# Patient Record
Sex: Female | Born: 1935 | Race: White | Hispanic: No | Smoking: Former smoker
Health system: Southern US, Community
[De-identification: ages and names within clinical notes are randomized; demographics above are authoritative.]

## PROBLEM LIST (undated history)

## (undated) DIAGNOSIS — R339 Retention of urine, unspecified: Secondary | ICD-10-CM

## (undated) DIAGNOSIS — I509 Heart failure, unspecified: Secondary | ICD-10-CM

## (undated) DIAGNOSIS — I82511 Chronic embolism and thrombosis of right femoral vein: Secondary | ICD-10-CM

## (undated) DIAGNOSIS — E785 Hyperlipidemia, unspecified: Secondary | ICD-10-CM

## (undated) DIAGNOSIS — J90 Pleural effusion, not elsewhere classified: Secondary | ICD-10-CM

## (undated) DIAGNOSIS — E46 Unspecified protein-calorie malnutrition: Secondary | ICD-10-CM

## (undated) DIAGNOSIS — J189 Pneumonia, unspecified organism: Secondary | ICD-10-CM

## (undated) DIAGNOSIS — I495 Sick sinus syndrome: Secondary | ICD-10-CM

## (undated) DIAGNOSIS — C3490 Malignant neoplasm of unspecified part of unspecified bronchus or lung: Secondary | ICD-10-CM

## (undated) DIAGNOSIS — M810 Age-related osteoporosis without current pathological fracture: Secondary | ICD-10-CM

## (undated) DIAGNOSIS — E876 Hypokalemia: Secondary | ICD-10-CM

## (undated) DIAGNOSIS — J9690 Respiratory failure, unspecified, unspecified whether with hypoxia or hypercapnia: Secondary | ICD-10-CM

## (undated) DIAGNOSIS — R1310 Dysphagia, unspecified: Secondary | ICD-10-CM

## (undated) DIAGNOSIS — J449 Chronic obstructive pulmonary disease, unspecified: Secondary | ICD-10-CM

## (undated) DIAGNOSIS — K219 Gastro-esophageal reflux disease without esophagitis: Secondary | ICD-10-CM

## (undated) DIAGNOSIS — D649 Anemia, unspecified: Secondary | ICD-10-CM

## (undated) DIAGNOSIS — I272 Pulmonary hypertension, unspecified: Secondary | ICD-10-CM

## (undated) DIAGNOSIS — I493 Ventricular premature depolarization: Secondary | ICD-10-CM

---

## 2020-09-12 ENCOUNTER — Emergency Department (HOSPITAL_COMMUNITY): Payer: Medicare Other

## 2020-09-12 ENCOUNTER — Encounter (HOSPITAL_COMMUNITY): Payer: Self-pay | Admitting: Family Medicine

## 2020-09-12 ENCOUNTER — Other Ambulatory Visit: Payer: Self-pay

## 2020-09-12 ENCOUNTER — Inpatient Hospital Stay (HOSPITAL_COMMUNITY): Payer: Medicare Other

## 2020-09-12 ENCOUNTER — Inpatient Hospital Stay (HOSPITAL_COMMUNITY)
Admission: EM | Admit: 2020-09-12 | Discharge: 2020-09-17 | DRG: 186 | Disposition: A | Discharge status: Skilled Nursing Facility | Payer: Medicare Other | Source: Skilled Nursing Facility | Attending: Internal Medicine | Admitting: Internal Medicine

## 2020-09-12 DIAGNOSIS — Z880 Allergy status to penicillin: Secondary | ICD-10-CM | POA: Diagnosis not present

## 2020-09-12 DIAGNOSIS — J90 Pleural effusion, not elsewhere classified: Principal | ICD-10-CM | POA: Diagnosis present

## 2020-09-12 DIAGNOSIS — J9621 Acute and chronic respiratory failure with hypoxia: Secondary | ICD-10-CM | POA: Diagnosis present

## 2020-09-12 DIAGNOSIS — R0602 Shortness of breath: Secondary | ICD-10-CM

## 2020-09-12 DIAGNOSIS — Z885 Allergy status to narcotic agent status: Secondary | ICD-10-CM | POA: Diagnosis not present

## 2020-09-12 DIAGNOSIS — U071 COVID-19: Secondary | ICD-10-CM | POA: Diagnosis present

## 2020-09-12 DIAGNOSIS — Z881 Allergy status to other antibiotic agents status: Secondary | ICD-10-CM

## 2020-09-12 DIAGNOSIS — I495 Sick sinus syndrome: Secondary | ICD-10-CM | POA: Diagnosis present

## 2020-09-12 DIAGNOSIS — L89321 Pressure ulcer of left buttock, stage 1: Secondary | ICD-10-CM | POA: Diagnosis present

## 2020-09-12 DIAGNOSIS — F0390 Unspecified dementia without behavioral disturbance: Secondary | ICD-10-CM | POA: Diagnosis present

## 2020-09-12 DIAGNOSIS — Z9889 Other specified postprocedural states: Secondary | ICD-10-CM

## 2020-09-12 DIAGNOSIS — R0902 Hypoxemia: Secondary | ICD-10-CM

## 2020-09-12 DIAGNOSIS — J449 Chronic obstructive pulmonary disease, unspecified: Secondary | ICD-10-CM

## 2020-09-12 DIAGNOSIS — R0600 Dyspnea, unspecified: Secondary | ICD-10-CM

## 2020-09-12 LAB — URINALYSIS, ROUTINE W REFLEX MICROSCOPIC
Bacteria, UA: NONE SEEN
Bilirubin Urine: NEGATIVE
Glucose, UA: NEGATIVE mg/dL
Ketones, ur: NEGATIVE mg/dL
Nitrite: NEGATIVE
Protein, ur: NEGATIVE mg/dL
Specific Gravity, Urine: 1.008 (ref 1.005–1.030)
pH: 7 (ref 5.0–8.0)

## 2020-09-12 LAB — CBC WITH DIFFERENTIAL/PLATELET
Abs Immature Granulocytes: 0.03 10*3/uL (ref 0.00–0.07)
Basophils Absolute: 0 10*3/uL (ref 0.0–0.1)
Basophils Relative: 1 %
Eosinophils Absolute: 0 10*3/uL (ref 0.0–0.5)
Eosinophils Relative: 0 %
HCT: 38.4 % (ref 36.0–46.0)
Hemoglobin: 12.2 g/dL (ref 12.0–15.0)
Immature Granulocytes: 1 %
Lymphocytes Relative: 24 %
Lymphs Abs: 1.5 10*3/uL (ref 0.7–4.0)
MCH: 29.9 pg (ref 26.0–34.0)
MCHC: 31.8 g/dL (ref 30.0–36.0)
MCV: 94.1 fL (ref 80.0–100.0)
Monocytes Absolute: 0.6 10*3/uL (ref 0.1–1.0)
Monocytes Relative: 9 %
Neutro Abs: 3.9 10*3/uL (ref 1.7–7.7)
Neutrophils Relative %: 65 %
Platelets: 264 10*3/uL (ref 150–400)
RBC: 4.08 MIL/uL (ref 3.87–5.11)
RDW: 13.2 % (ref 11.5–15.5)
WBC: 6 10*3/uL (ref 4.0–10.5)
nRBC: 0 % (ref 0.0–0.2)

## 2020-09-12 LAB — COMPREHENSIVE METABOLIC PANEL
ALT: 25 U/L (ref 0–44)
AST: 37 U/L (ref 15–41)
Albumin: 3.1 g/dL — ABNORMAL LOW (ref 3.5–5.0)
Alkaline Phosphatase: 145 U/L — ABNORMAL HIGH (ref 38–126)
Anion gap: 10 (ref 5–15)
BUN: 16 mg/dL (ref 8–23)
CO2: 27 mmol/L (ref 22–32)
Calcium: 8 mg/dL — ABNORMAL LOW (ref 8.9–10.3)
Chloride: 94 mmol/L — ABNORMAL LOW (ref 98–111)
Creatinine, Ser: 0.68 mg/dL (ref 0.44–1.00)
GFR, Estimated: >60 mL/min (ref >60)
Glucose, Bld: 108 mg/dL — ABNORMAL HIGH (ref 70–99)
Potassium: 4.2 mmol/L (ref 3.5–5.1)
Sodium: 131 mmol/L — ABNORMAL LOW (ref 135–145)
Total Bilirubin: 0.6 mg/dL (ref 0.3–1.2)
Total Protein: 5.9 g/dL — ABNORMAL LOW (ref 6.5–8.1)

## 2020-09-12 LAB — RESP PANEL BY RT-PCR (FLU A&B, COVID) ARPGX2
Influenza A by PCR: NEGATIVE
Influenza B by PCR: NEGATIVE
SARS Coronavirus 2 by RT PCR: POSITIVE — AB

## 2020-09-12 LAB — LIPASE, BLOOD: Lipase: 27 U/L (ref 11–51)

## 2020-09-12 LAB — BRAIN NATRIURETIC PEPTIDE: B Natriuretic Peptide: 344.1 pg/mL — ABNORMAL HIGH (ref 0.0–100.0)

## 2020-09-12 LAB — TROPONIN I (HIGH SENSITIVITY)
Troponin I (High Sensitivity): 27 ng/L — ABNORMAL HIGH (ref <18)
Troponin I (High Sensitivity): 36 ng/L — ABNORMAL HIGH (ref <18)

## 2020-09-12 MED ORDER — MOMETASONE FURO-FORMOTEROL FUM 200-5 MCG/ACT IN AERO
2.0000 | INHALATION_SPRAY | Freq: Two times a day (BID) | RESPIRATORY_TRACT | Status: DC
  Administered 2020-09-13 – 2020-09-17 (×9): 2 via RESPIRATORY_TRACT
  Filled 2020-09-12: qty 8.8

## 2020-09-12 MED ORDER — SODIUM CHLORIDE 0.9 % IV SOLN
500.0000 mg | INTRAVENOUS | Status: DC
  Administered 2020-09-13 – 2020-09-14 (×2): 500 mg via INTRAVENOUS
  Filled 2020-09-12 (×4): qty 500

## 2020-09-12 MED ORDER — ENOXAPARIN SODIUM 40 MG/0.4ML IJ SOSY
40.0000 mg | PREFILLED_SYRINGE | INTRAMUSCULAR | Status: DC
  Administered 2020-09-13 – 2020-09-14 (×2): 40 mg via SUBCUTANEOUS
  Filled 2020-09-12 (×2): qty 0.4

## 2020-09-12 MED ORDER — SENNOSIDES-DOCUSATE SODIUM 8.6-50 MG PO TABS: 1.0000 | ORAL_TABLET | Freq: Every evening | ORAL | Status: DC | PRN

## 2020-09-12 MED ORDER — ONDANSETRON HCL 4 MG/2ML IJ SOLN
4.0000 mg | Freq: Four times a day (QID) | INTRAMUSCULAR | Status: DC | PRN
  Administered 2020-09-14 (×2): 4 mg via INTRAVENOUS
  Filled 2020-09-12 (×2): qty 2

## 2020-09-12 MED ORDER — ACETAMINOPHEN 325 MG PO TABS
650.0000 mg | ORAL_TABLET | Freq: Four times a day (QID) | ORAL | Status: DC | PRN
  Administered 2020-09-13: 650 mg via ORAL
  Filled 2020-09-12: qty 2

## 2020-09-12 MED ORDER — ALBUTEROL SULFATE (2.5 MG/3ML) 0.083% IN NEBU
2.5000 mg | INHALATION_SOLUTION | RESPIRATORY_TRACT | Status: DC | PRN
  Filled 2020-09-12: qty 3

## 2020-09-12 MED ORDER — SODIUM CHLORIDE 0.9 % IV BOLUS
500.0000 mL | Freq: Once | INTRAVENOUS | Status: AC
  Administered 2020-09-12: 500 mL via INTRAVENOUS

## 2020-09-12 MED ORDER — ONDANSETRON HCL 4 MG PO TABS
4.0000 mg | ORAL_TABLET | Freq: Four times a day (QID) | ORAL | Status: DC | PRN
  Administered 2020-09-15: 4 mg via ORAL
  Filled 2020-09-12: qty 1

## 2020-09-12 MED ORDER — FUROSEMIDE 10 MG/ML IJ SOLN
40.0000 mg | Freq: Every morning | INTRAMUSCULAR | Status: AC
  Administered 2020-09-13 – 2020-09-14 (×2): 40 mg via INTRAVENOUS
  Filled 2020-09-12 (×2): qty 4

## 2020-09-12 MED ORDER — ACETAMINOPHEN 650 MG RE SUPP: 650.0000 mg | Freq: Four times a day (QID) | RECTAL | Status: DC | PRN

## 2020-09-12 MED ORDER — FUROSEMIDE 10 MG/ML IJ SOLN
40.0000 mg | Freq: Once | INTRAMUSCULAR | Status: AC
  Administered 2020-09-12: 40 mg via INTRAVENOUS
  Filled 2020-09-12: qty 4

## 2020-09-12 MED ORDER — SODIUM CHLORIDE 0.9 % IV SOLN
1.0000 g | INTRAVENOUS | Status: DC
  Administered 2020-09-13 (×2): 1 g via INTRAVENOUS
  Filled 2020-09-12 (×3): qty 10
  Filled 2020-09-12: qty 1

## 2020-09-12 NOTE — ED Provider Notes (Signed)
[facility] DEPT Provider Note   CSN: [id number] Arrival date & time: 09/12/20  1749     History Chief Complaint  Patient presents with   Shortness of Breath    Samantha Fitzgerald is a 87 y.o. female with past medical history significant for CHF, COPD, prior pleural effusion who presents for evaluation of shortness of breath.  Typically wears 2 L of oxygen at baseline.  Facility called EMS out for oxygen at 60%.  Per EMS patient was 77% on her baseline 2 L.  Increased to 5 L up to 95%.  Daughter states they went to visit her today and patient did not remember that they had visited her.  They feel like she is generally weak.  She has had unproductive cough.   Patient denies any complaints.  No headache, fever, chest pain, shortness of breath, abdominal pain, numbness.  Began to have NBNB emesis with EMS x 1.  Denies additional aggravating or alleviating factors.  History obtained from patient and past medical records.  No interpreter used.    Daughter Nehemiah Massed at [phone]   >>>Additional history from daughter in room.  States she does not feel patient is altered.  Patient has transient confusion which is at baseline.  States for the last few days patient has had a cough, SOB.  Her nursing facility was not able to get her oxygen above 60%. No recent falls. No sick contacts.  HPI     No past medical history on file.  Patient Active Problem List   Diagnosis Date Noted   Pleural effusion on left 09/12/2020     History reviewed  OB History   No obstetric history on file.     No family history on file.     Home Medications Prior to Admission medications   Not on File    Allergies    Amoxicillin, Doxycycline, Levofloxacin, and Morphine and related  Review of Systems   Review of Systems  Constitutional: Negative.   HENT: Negative.    Respiratory: Negative.    Cardiovascular: Negative.   Gastrointestinal: Negative.    Genitourinary: Negative.   Musculoskeletal: Negative.   Skin: Negative.   Neurological: Negative.   All other systems reviewed and are negative.  Physical Exam Updated Vital Signs BP (!) 114/44   Pulse (!) 53   Temp 97.6 F (36.4 C) (Oral)   Resp (!) 22   Ht '5\' 5"'  (1.651 m)   Wt 59 kg   SpO2 98%   BMI 21.63 kg/m   Physical Exam Vitals and nursing note reviewed.  Constitutional:      General: She is not in acute distress.    Appearance: She is well-developed. She is not ill-appearing, toxic-appearing or diaphoretic.  HENT:     Head: Normocephalic and atraumatic.  Eyes:     Pupils: Pupils are equal, round, and reactive to light.  Cardiovascular:     Rate and Rhythm: Normal rate.     Pulses: Normal pulses.          Dorsalis pedis pulses are 2+ on the right side and 2+ on the left side.     Heart sounds: Normal heart sounds.  Pulmonary:     Effort: Pulmonary effort is normal. No respiratory distress.     Breath sounds: Normal breath sounds.     Comments: 3.5 L [place]. Speaks in full sentences without difficulty. Decreased lung sounds on bilateral lower lungs Chest:     Comments: Equal rise  and fall to chest wall Abdominal:     General: Bowel sounds are normal. There is no distension.     Palpations: Abdomen is soft.     Comments: Soft, nontender  Musculoskeletal:        General: Normal range of motion.     Cervical back: Normal range of motion.     Right lower leg: No tenderness. No edema.     Left lower leg: No tenderness. No edema.     Comments: No bony tenderness.  Moves all 4 extremities without difficulty  Feet:     Right foot:     Toenail Condition: Right toenails are long.     Left foot:     Toenail Condition: Left toenails are long.  Skin:    General: Skin is warm and dry.  Neurological:     General: No focal deficit present.     Mental Status: She is alert.     Cranial Nerves: Cranial nerves are intact.     Motor: No tremor or pronator drift.      Comments: Cranial nerves 2-12  grossly intact Moves all 4 extremities Alert to person, place, time Equal hand grip bilaterally  Psychiatric:        Mood and Affect: Mood normal.    ED Results / Procedures / Treatments   Labs (all labs ordered are listed, but only abnormal results are displayed) Labs Reviewed  COMPREHENSIVE METABOLIC PANEL - Abnormal; Notable for the following components:      Result Value   Sodium 131 (*)    Chloride 94 (*)    Glucose, Bld 108 (*)    Calcium 8.0 (*)    Total Protein 5.9 (*)    Albumin 3.1 (*)    Alkaline Phosphatase 145 (*)    All other components within normal limits  BRAIN NATRIURETIC PEPTIDE - Abnormal; Notable for the following components:   B Natriuretic Peptide 344.1 (*)    All other components within normal limits  TROPONIN I (HIGH SENSITIVITY) - Abnormal; Notable for the following components:   Troponin I (High Sensitivity) 27 (*)    All other components within normal limits  TROPONIN I (HIGH SENSITIVITY) - Abnormal; Notable for the following components:   Troponin I (High Sensitivity) 36 (*)    All other components within normal limits  RESP PANEL BY RT-PCR (FLU A&B, COVID) [id number]  URINE CULTURE  CULTURE, BLOOD (ROUTINE X 2)  CULTURE, BLOOD (ROUTINE X 2)  CBC WITH DIFFERENTIAL/PLATELET  LIPASE, BLOOD  URINALYSIS, ROUTINE W REFLEX MICROSCOPIC    EKG None  Radiology CT Head Wo Contrast  Result Date: 09/12/2020 CLINICAL DATA:  87 year old female with delirium. EXAM: CT HEAD WITHOUT CONTRAST TECHNIQUE: Contiguous axial images were obtained from the base of the skull through the vertex without intravenous contrast. COMPARISON:  None. FINDINGS: Brain: There is mild age-related atrophy and advanced chronic microvascular ischemic changes. There is no acute intracranial hemorrhage. No mass effect or midline shift. No extra-axial fluid collection. Vascular: No hyperdense vessel or unexpected calcification. Skull: Normal. Negative for  fracture or focal lesion. Sinuses/Orbits: No acute finding. Other: None IMPRESSION: 1. No acute intracranial pathology. 2. Age-related atrophy and advanced chronic microvascular ischemic changes. Electronically Signed   By: Anner Crete M.D.   On: 09/12/2020 20:30   DG Chest Portable 1 View  Result Date: 09/12/2020 CLINICAL DATA:  Hypoxia.  Cough and weakness. EXAM: PORTABLE CHEST 1 VIEW COMPARISON:  None. FINDINGS: The patient is rotated to the right  on today's radiograph, reducing diagnostic sensitivity and specificity. Large left pleural effusion with a easy opacity in the left hemithorax partially attributable to the pleural effusion and likely partially attributable to the rightward rotation. Postoperative findings along the right hilum. Hazy density in the right apex potentially reflecting atelectasis or pneumonia although there is substantial superimposition of osseous shadows in this region which may be contributory. Descending thoracic aortic atherosclerotic calcification. Bony demineralization. IMPRESSION: 1. The patient is rotated to the right on today's radiograph, reducing diagnostic sensitivity and specificity. 2. Large left pleural effusion with passive atelectasis. Difficult to exclude superimposed left pneumonia. 3. Indistinct density at the right lung apex, airspace opacity versus superimposition of osseous shadows. 4.  Aortic Atherosclerosis (ICD10-I70.0). 5. Consider chest CT for further characterization if symptoms warrant. Electronically Signed   By: Van Clines M.D.   On: 09/12/2020 20:25    Procedures .Critical Care  Date/Time: 09/12/2020 10:09 PM Performed by: Nettie Elm, PA-C Authorized by: Nettie Elm, PA-C   Critical care provider statement:    Critical care time (minutes):  45   Critical care was necessary to treat or prevent imminent or life-threatening deterioration of the following conditions:  Respiratory failure   Critical care was time spent  personally by me on the following activities:  Discussions with consultants, evaluation of patient's response to treatment, examination of patient, ordering and performing treatments and interventions, ordering and review of laboratory studies, ordering and review of radiographic studies, pulse oximetry, re-evaluation of patient's condition, obtaining history from patient or surrogate and review of old charts   Medications Ordered in ED Medications  sodium chloride 0.9 % bolus 500 mL (0 mLs Intravenous Stopped 09/12/20 1930)  furosemide (LASIX) injection 40 mg (40 mg Intravenous Given 09/12/20 2118)   ED Course  I have reviewed the triage vital signs and the nursing notes.  Pertinent labs & imaging results that were available during my care of the patient were reviewed by me and considered in my medical decision making (see chart for details).  Here for evaluation of shortness of breath and upper respiratory complaints over the last few days.  Hypoxic on home oxygen.  Patient is afebrile, nonseptic, not ill-appearing.  Decreased breath sounds on left.  No wheezing.  She is on 5 L to maintain normal oxygen saturations.  Abdomen soft, nontender.  She does not appear clinically fluid overloaded.  EMS did mention possible confusion however daughter in room states patient is at normal mentation for her.  Has been compliant with her home meds per her daughter and MAR from facility.  Plan on labs, imaging and reassess  Labs and imaging personally reviewed and interpreted:  CBC without leukocytosis Metabolic panel sodium 629, glucose 108, alk phos 145 BNP 344 Lipase 27 COVID pending Trop 27>>36. No prior to compare in our records.  Patient denies any chest pain, low suspicion for ACS, PE or dissection. Ct head wo acute findings Chest x-ray with large left pleural effusion, cannot rule out underlying infectious process   Patient able to be titrated to 3.5 L [place] which is up from her baseline. Decreased  lung sound on left, consistent with pleural effusion.  Lasix at home per [facility] 20 mg daily.  Chest xray with large left pleural effusion. BNP elevated no prior to compare.  Discussed possible need for thoracentesis. Patient stated that she did not want this performed. Will add some lasix.  Patient will be admitted for acute on chronic hypoxic respiratory failure likely due  to large pleural effusion.  CONSULT with Dr. Tonie Griffith with [facility] who agrees to evaluate patient for admission.  Updated patient's daughter with plan for admission  The patient appears reasonably stabilized for admission considering the current resources, flow, and capabilities available in the ED at this time, and I doubt any other [facility] requiring further screening and/or treatment in the ED prior to admission.   Discussed with attending Dr. Alvino Chapel who is agreeable to treatment, plan and disposition.    MDM Rules/Calculators/A&P                           Final Clinical Impression(s) / ED Diagnoses Final diagnoses:  SOB (shortness of breath)  Pleural effusion    Rx / DC Orders ED Discharge Orders     None        Adeeb Konecny A, PA-C 09/12/20 2210    Davonna Belling, MD 09/12/20 [phone]

## 2020-09-12 NOTE — ED Triage Notes (Signed)
Per ems: pt coming from [facility] c/o onset of sudden shob. Baseline 2L @ 94%. SpO2 was 60% at facility. Per daughter, pt is having increased weakness, confusion and unproductive cough.   77% 2L for ems 95% on 5L

## 2020-09-12 NOTE — H&P (Signed)
History and Physical    Stevee Valenta GYK:[id number] DOB: [birth year] DOA: 09/12/2020  PCP: System, Provider Not In   Patient coming from: Home  Chief Complaint: SOB, cough  HPI: Samantha Fitzgerald is a 87 y.o. female with medical history significant for COPD, SSS, CHF who presents with shortness of breath and dry cough.  She typically wears oxygen at 2 L nasal cannula at home.  She was brought in from a facility by EMS after she was found in oxygen saturation of 60%.  When EMS arrived her ox saturation was 70% on her baseline 2 L of oxygen and she was increased to 5 L which increased her O2 saturation 95%.  Her daughter went to visit her today and found that she was weaker and did not look right and had a nonproductive cough so oxygen saturation was checked.  When it was so low EMS was contacted.  She reports no fever but has had some subjective chills.  She was given breathing treatments and had 1 episode of emesis with EMS in route to the hospital.  Denies any falls or injuries.  Daughter reportedly told the ER provider that patient does have some mild dementia.  Daughter is not present in the emergency room when she is seen.  ED Course: Ms. Blair Promise was found to have a large left-sided pleural effusion.  She has a mild elevated BNP level and mildly elevated troponin levels.  Sodium is 131, potassium 4.2, chloride 94, bicarb 27, BUN 16, creatinine 0.68, lipase 27, glucose 108.  CBC is within normal limits.  It has been ordered emergency room and is still pending.  Hospitalist service been asked to admit patient for further management  Review of Systems:  General: Reports weakness,  chills, weight loss, night sweats.  Denies dizziness.  HENT: Denies head trauma, headache, denies change in hearing, tinnitus.  Denies nasal bleeding.  Denies sore throat, sores in mouth.  Denies difficulty swallowing Eyes: Denies blurry vision, pain in eye, drainage.  Denies discoloration of eyes. Neck:  Denies pain.  Denies swelling.  Denies pain with movement. Cardiovascular: Denies chest pain, palpitations.  Denies edema.  Denies orthopnea Respiratory: Reports shortness of breath, dry cough.  Denies wheezing.  Denies sputum production Gastrointestinal: Denies abdominal pain, swelling.  Denies nausea, vomiting, diarrhea.  Denies melena.  Denies hematemesis. Musculoskeletal: Denies limitation of movement. Denies deformity or swelling. Denies arthralgias or myalgias. Genitourinary: Denies pelvic pain.  Denies urinary frequency or hesitancy.  Denies dysuria.  Skin: Denies rash.  Denies petechiae, purpura, ecchymosis. Neurological: Denies headache.  Denies syncope.  Denies seizure activity. Denies paresthesia.  Denies slurred speech, drooping face.  Denies visual change. Psychiatric: Denies depression, anxiety. Denies hallucinations.  History reviewed. No pertinent past medical history.  History reviewed. No pertinent surgical history.  Social History  has no history on file for tobacco use, alcohol use, and drug use.  Allergies  Allergen Reactions   Amoxicillin    Doxycycline    Levofloxacin    Morphine And Related     History reviewed. No pertinent family history.   Prior to Admission medications   Not on File    Physical Exam: Vitals:   09/12/20 1930 09/12/20 1940 09/12/20 2000 09/12/20 2100  BP:  (!) 106/38 (!) 116/39 (!) 114/44  Pulse: (!) 57 (!) 58 (!) 58 (!) 53  Resp: 19 20 (!) 21 (!) 22  Temp:      TempSrc:      SpO2: 100% 99% 100%  98%  Weight:      Height:        Constitutional: NAD, calm, comfortable Vitals:   09/12/20 1930 09/12/20 1940 09/12/20 2000 09/12/20 2100  BP:  (!) 106/38 (!) 116/39 (!) 114/44  Pulse: (!) 57 (!) 58 (!) 58 (!) 53  Resp: 19 20 (!) 21 (!) 22  Temp:      TempSrc:      SpO2: 100% 99% 100% 98%  Weight:      Height:       General: WDWN, Alert and oriented to person and place.  Eyes: EOMI, PERRL, conjunctivae normal. Sclera  nonicteric HENT:  [place]/AT, external ears normal. Nares patent without epistasis. Mucous membranes are moist. Posterior pharynx clear of any exudate or lesions. Neck: Soft, normal range of motion, supple, no masses, Trachea midline Respiratory: Decreased breath sounds on the left side.  No wheezing, no crackles. Normal respiratory effort. No accessory muscle use.  Cardiovascular: Regular rate and rhythm, no murmurs / rubs / gallops. No extremity edema.  Abdomen: Soft, no tenderness, nondistended, no rebound or guarding.  No masses palpated. Bowel sounds normoactive Musculoskeletal: FROM. No cyanosis. No joint deformity upper and lower extremities. Normal muscle tone.  Skin: Warm, dry, intact no rashes, lesions, ulcers. No induration Neurologic: CN 2-12 grossly intact.  Normal speech.  Sensation intact to touch, Strength 5/5 in all extremities.   Psychiatric:  Normal mood.    Labs on Admission: I have personally reviewed following labs and imaging studies  CBC: Recent Labs  Lab 09/12/20 1804  WBC 6.0  NEUTROABS 3.9  HGB 12.2  HCT 38.4  MCV 94.1  PLT 132    Basic Metabolic Panel: Recent Labs  Lab 09/12/20 1804  NA 131*  K 4.2  CL 94*  CO2 27  GLUCOSE 108*  BUN 16  CREATININE 0.68  CALCIUM 8.0*    GFR: Estimated Creatinine Clearance: 38.7 mL/min (by C-G formula based on SCr of 0.68 mg/dL).  Liver Function Tests: Recent Labs  Lab 09/12/20 1804  AST 37  ALT 25  ALKPHOS 145*  BILITOT 0.6  PROT 5.9*  ALBUMIN 3.1*    Urine analysis: No results found for: COLORURINE, APPEARANCEUR, LABSPEC, PHURINE, GLUCOSEU, HGBUR, BILIRUBINUR, KETONESUR, PROTEINUR, UROBILINOGEN, NITRITE, LEUKOCYTESUR  Radiological Exams on Admission: CT Head Wo Contrast  Result Date: 09/12/2020 CLINICAL DATA:  87 year old female with delirium. EXAM: CT HEAD WITHOUT CONTRAST TECHNIQUE: Contiguous axial images were obtained from the base of the skull through the vertex without intravenous contrast.  COMPARISON:  None. FINDINGS: Brain: There is mild age-related atrophy and advanced chronic microvascular ischemic changes. There is no acute intracranial hemorrhage. No mass effect or midline shift. No extra-axial fluid collection. Vascular: No hyperdense vessel or unexpected calcification. Skull: Normal. Negative for fracture or focal lesion. Sinuses/Orbits: No acute finding. Other: None IMPRESSION: 1. No acute intracranial pathology. 2. Age-related atrophy and advanced chronic microvascular ischemic changes. Electronically Signed   By: Anner Crete M.D.   On: 09/12/2020 20:30   CT CHEST WO CONTRAST  Result Date: 09/12/2020 CLINICAL DATA:  Abnormal x-ray EXAM: CT CHEST WITHOUT CONTRAST TECHNIQUE: Multidetector CT imaging of the chest was performed following the standard protocol without IV contrast. COMPARISON:  Chest x-ray earlier today FINDINGS: Cardiovascular: Heart is normal size. Aorta is normal caliber. Coronary artery and aortic calcifications. No aneurysm. Mediastinum/Nodes: No mediastinal, hilar, or axillary adenopathy. Trachea and esophagus are unremarkable. Thyroid unremarkable. Lungs/Pleura: Large left pleural effusion and small right pleural effusion. Compressive atelectasis in the left  lower lobe. Atelectasis or infiltrates in the right lower lobe. Upper Abdomen: Imaging into the upper abdomen demonstrates no acute findings. Musculoskeletal: Chest wall soft tissues are unremarkable. No acute bony abnormality. IMPRESSION: Large left pleural effusion and small right pleural effusion. Compressive atelectasis in the left lower lobe. Right basilar atelectasis or developing infiltrate. Coronary artery disease. Aortic Atherosclerosis (ICD10-I70.0). Electronically Signed   By: Rolm Baptise M.D.   On: 09/12/2020 22:11   DG Chest Portable 1 View  Result Date: 09/12/2020 CLINICAL DATA:  Hypoxia.  Cough and weakness. EXAM: PORTABLE CHEST 1 VIEW COMPARISON:  None. FINDINGS: The patient is rotated to  the right on today's radiograph, reducing diagnostic sensitivity and specificity. Large left pleural effusion with a easy opacity in the left hemithorax partially attributable to the pleural effusion and likely partially attributable to the rightward rotation. Postoperative findings along the right hilum. Hazy density in the right apex potentially reflecting atelectasis or pneumonia although there is substantial superimposition of osseous shadows in this region which may be contributory. Descending thoracic aortic atherosclerotic calcification. Bony demineralization. IMPRESSION: 1. The patient is rotated to the right on today's radiograph, reducing diagnostic sensitivity and specificity. 2. Large left pleural effusion with passive atelectasis. Difficult to exclude superimposed left pneumonia. 3. Indistinct density at the right lung apex, airspace opacity versus superimposition of osseous shadows. 4.  Aortic Atherosclerosis (ICD10-I70.0). 5. Consider chest CT for further characterization if symptoms warrant. Electronically Signed   By: Van Clines M.D.   On: 09/12/2020 20:25    EKG: Independently reviewed.  EKG shows normal sinus rhythm with a heart rate of 65.  No acute ST elevation or depression.  QTc 4 2  Assessment/Plan Principal Problem:   Pleural effusion on left Ms. Blair Promise will be admitted to telemetry floor.  Will be started on empiric antibiotics as pneumonia cannot be ruled out as it could be obscured by the pleural effusion.  Obtain CT of the chest to better characterize pleural effusion.  Patient may need IR consultation in the morning for thoracentesis  Active Problems:   Hypoxia Secondary to pleural effusion.  Pulmonal oxygen as needed    COPD (chronic obstructive pulmonary disease)  Patient is on trilogy at home.  None of her home medications are listed in the chart.  Pharmacy will verify and reconcile home medications.  In the meantime patient be given Dulera twice a day and  albuterol as needed.  Supplement oxygen be provided as needed to keep O2 sat between 92-96%    Sick sinus syndrome  Home medications the verified and reconciled by pharmacy and then will be resumed     DVT prophylaxis: Lovenox for DVT prophylaxis Code Status:   Full code Family Communication:  No family at bedside. Disposition Plan:   Patient is from:  Home  Anticipated DC to:  Home versus SNF, to be determined  Anticipated DC date:  Anticipate 2 midnight or more stay    Admission status:  Inpatient    Yevonne Aline Falyn Rubel MD [facility]  How to contact the [facility] Attending or Consulting provider [place] or covering provider during after hours [place], for this patient?   Check the care team in [facility] and look for a) attending/consulting [facility] provider listed and b) the [facility] team listed Log into [email or web address] and use [place] universal password to access. If you do not have the password, please contact the hospital operator. Locate the [facility] provider you are looking for under [facility] and page  to a number that you can be directly reached. If you still have difficulty reaching the provider, please page the [facility] (Director on Call) for the Hospitalists listed on amion for assistance.  09/12/2020, 10:21 PM

## 2020-09-12 NOTE — ED Notes (Signed)
Pt placed on purewick due to incontinence and need for urine specimen.

## 2020-09-13 ENCOUNTER — Inpatient Hospital Stay (HOSPITAL_COMMUNITY): Payer: Medicare Other

## 2020-09-13 ENCOUNTER — Encounter (HOSPITAL_COMMUNITY): Payer: Self-pay | Admitting: Family Medicine

## 2020-09-13 LAB — BASIC METABOLIC PANEL
Anion gap: 11 (ref 5–15)
BUN: 17 mg/dL (ref 8–23)
CO2: 30 mmol/L (ref 22–32)
Calcium: 7.8 mg/dL — ABNORMAL LOW (ref 8.9–10.3)
Chloride: 93 mmol/L — ABNORMAL LOW (ref 98–111)
Creatinine, Ser: 0.66 mg/dL (ref 0.44–1.00)
GFR, Estimated: >60 mL/min (ref >60)
Glucose, Bld: 81 mg/dL (ref 70–99)
Potassium: 4 mmol/L (ref 3.5–5.1)
Sodium: 134 mmol/L — ABNORMAL LOW (ref 135–145)

## 2020-09-13 LAB — C-REACTIVE PROTEIN: CRP: 1 mg/dL — ABNORMAL HIGH (ref <1.0)

## 2020-09-13 LAB — LACTATE DEHYDROGENASE, PLEURAL OR PERITONEAL FLUID: LD, Fluid: 77 U/L — ABNORMAL HIGH (ref 3–23)

## 2020-09-13 LAB — CREATININE, SERUM
Creatinine, Ser: 0.6 mg/dL (ref 0.44–1.00)
GFR, Estimated: >60 mL/min (ref >60)

## 2020-09-13 LAB — CBC
HCT: 36.8 % (ref 36.0–46.0)
HCT: 37.4 % (ref 36.0–46.0)
Hemoglobin: 11.6 g/dL — ABNORMAL LOW (ref 12.0–15.0)
Hemoglobin: 11.6 g/dL — ABNORMAL LOW (ref 12.0–15.0)
MCH: 29.6 pg (ref 26.0–34.0)
MCH: 29.8 pg (ref 26.0–34.0)
MCHC: 31 g/dL (ref 30.0–36.0)
MCHC: 31.5 g/dL (ref 30.0–36.0)
MCV: 94.6 fL (ref 80.0–100.0)
MCV: 95.4 fL (ref 80.0–100.0)
Platelets: 233 10*3/uL (ref 150–400)
Platelets: 247 10*3/uL (ref 150–400)
RBC: 3.89 MIL/uL (ref 3.87–5.11)
RBC: 3.92 MIL/uL (ref 3.87–5.11)
RDW: 13.3 % (ref 11.5–15.5)
RDW: 13.3 % (ref 11.5–15.5)
WBC: 4.7 10*3/uL (ref 4.0–10.5)
WBC: 4.9 10*3/uL (ref 4.0–10.5)
nRBC: 0 % (ref 0.0–0.2)
nRBC: 0 % (ref 0.0–0.2)

## 2020-09-13 LAB — LACTATE DEHYDROGENASE: LDH: 161 U/L (ref 98–192)

## 2020-09-13 LAB — BODY FLUID CELL COUNT WITH DIFFERENTIAL
Lymphs, Fluid: 91 %
Monocyte-Macrophage-Serous Fluid: 7 % — ABNORMAL LOW (ref 50–90)
Neutrophil Count, Fluid: 2 % (ref 0–25)
Total Nucleated Cell Count, Fluid: 719 cu mm (ref 0–1000)

## 2020-09-13 LAB — D-DIMER, QUANTITATIVE: D-Dimer, Quant: 3.78 ug/mL-FEU — ABNORMAL HIGH (ref 0.00–0.50)

## 2020-09-13 LAB — GLUCOSE, PLEURAL OR PERITONEAL FLUID: Glucose, Fluid: 86 mg/dL

## 2020-09-13 LAB — PROTEIN, PLEURAL OR PERITONEAL FLUID: Total protein, fluid: 3.3 g/dL

## 2020-09-13 LAB — PROCALCITONIN: Procalcitonin: <0.1 ng/mL

## 2020-09-13 MED ORDER — ZINC SULFATE 220 (50 ZN) MG PO CAPS
220.0000 mg | ORAL_CAPSULE | Freq: Every day | ORAL | Status: DC
  Administered 2020-09-13 – 2020-09-17 (×5): 220 mg via ORAL
  Filled 2020-09-13 (×6): qty 1

## 2020-09-13 MED ORDER — SODIUM CHLORIDE 0.9 % IV SOLN
100.0000 mg | Freq: Every day | INTRAVENOUS | Status: AC
  Administered 2020-09-14 – 2020-09-17 (×4): 100 mg via INTRAVENOUS
  Filled 2020-09-13 (×5): qty 20

## 2020-09-13 MED ORDER — GUAIFENESIN-DM 100-10 MG/5ML PO SYRP: 10.0000 mL | ORAL_SOLUTION | ORAL | Status: DC | PRN

## 2020-09-13 MED ORDER — LIDOCAINE HCL 1 % IJ SOLN
INTRAMUSCULAR | Status: AC
  Filled 2020-09-13: qty 20

## 2020-09-13 MED ORDER — SODIUM CHLORIDE 0.9 % IV SOLN
200.0000 mg | Freq: Once | INTRAVENOUS | Status: AC
  Administered 2020-09-13: 200 mg via INTRAVENOUS
  Filled 2020-09-13: qty 40

## 2020-09-13 MED ORDER — ASCORBIC ACID 500 MG PO TABS
500.0000 mg | ORAL_TABLET | Freq: Every day | ORAL | Status: DC
  Administered 2020-09-13 – 2020-09-17 (×5): 500 mg via ORAL
  Filled 2020-09-13 (×5): qty 1

## 2020-09-13 MED ORDER — IPRATROPIUM-ALBUTEROL 20-100 MCG/ACT IN AERS
1.0000 | INHALATION_SPRAY | Freq: Four times a day (QID) | RESPIRATORY_TRACT | Status: DC
  Administered 2020-09-13 – 2020-09-17 (×12): 1 via RESPIRATORY_TRACT
  Filled 2020-09-13 (×2): qty 4

## 2020-09-13 MED ORDER — METHYLPREDNISOLONE SODIUM SUCC 40 MG IJ SOLR
40.0000 mg | Freq: Every day | INTRAMUSCULAR | Status: DC
  Administered 2020-09-13 – 2020-09-17 (×5): 40 mg via INTRAVENOUS
  Filled 2020-09-13 (×5): qty 1

## 2020-09-13 NOTE — Procedures (Signed)
PROCEDURE SUMMARY:  Successful image-guided left thoracentesis. Yielded 850 milliliters of hazy gold fluid. Patient tolerated procedure well. No immediate complications. EBL < 1 mL.  Specimen was sent for labs. CXR ordered.  Please see imaging section of Epic for full dictation.   Claris Pong Quantrell Splitt PA-C 09/13/2020 11:27 AM

## 2020-09-13 NOTE — Progress Notes (Signed)
0053 Pt transferred from room 1406 to 1422 d/t COVID test coming back positive.  RN agrees with admitting RN's [facility]) assessment, performed skin assessment with Hillary prior to patient transport. Pt on 3L Nasal Cannula, 100%, VSS

## 2020-09-13 NOTE — Progress Notes (Signed)
PROGRESS NOTE    Samantha Fitzgerald  UKG:[id number] DOB: 09-May-1924 DOA: 09/12/2020 PCP: System, Provider Not In   Brief Narrative: Taken from H&P. Samantha Fitzgerald is a 87 y.o. female with medical history significant for COPD, SSS, CHF who presents with shortness of breath and dry cough.  She typically wears oxygen at 2 L nasal cannula at home.  She was brought in from a facility by EMS after she was found in oxygen saturation of 60%.  When EMS arrived her ox saturation was 70% on her baseline 2 L of oxygen and she was increased to 5 L which increased her O2 saturation 95%. Patient was found to have a large left-sided pleural effusion, mildly elevated BNP and troponin.  CBC within normal limit.  COVID-19 PCR came back positive. Echocardiogram ordered-pending Left-sided thoracentesis ordered-pending labs.  Subjective: Patient was seen and examined today.  Stating that shortness of breath is better while she is in the bed, becoming more short of breath with minor exertion.  Saturating in high 90s on 3 L of oxygen, baseline use of 2 L.  Discussed about thoracentesis and she agrees to proceed with.  Assessment & Plan:   Principal Problem:   Pleural effusion on left Active Problems:   COPD (chronic obstructive pulmonary disease) (HCC)   Sick sinus syndrome (HCC)   Hypoxia  Acute on chronic hypoxic respiratory failure.  Some increasing oxygen requirement as compared to her baseline of 2 L.  Left-sided pleural effusion.  CT chest with large left-sided pleural effusion and a small right-sided effusion.  Some underlying atelectasis with some concern of developing pneumonia. Ultrasound-guided thoracentesis was done with removal of 850 mL of fluid-pending labs. Echocardiogram ordered to evaluate heart failure-still pending. -Continue with IV Lasix. -Follow-up pleural fluid labs and echocardiogram. -Daily weight and BMP -Strict intake and output  COVID-19 infection.  CT chest with  concern of bilateral atelectasis and right lower lobe developing infiltrate.  COVID-19 PCR came back positive.  She was started on ceftriaxone and Zithromax by the admitting provider. -Start her on remdesivir. -Start her on steroid as there is some hypoxia as compared to baseline. -Check procalcitonin-if negative can discontinue antibiotics. -Check inflammatory markers and monitor. -Continue with supplements and supportive care. -Continue with supplemental oxygen.  History of COPD.  No wheezing today.  Patient was on Trelegy at home. -Continue home meds once med rec is completed. -Continue with supplemental oxygen to keep the saturation above 90%.  History of sick sinus syndrome.  No acute concern and rate is well controlled at this time. -Continue home medications once med rec is done-sent message to the pharmacy.  Objective: Vitals:   09/13/20 0838 09/13/20 1101 09/13/20 1114 09/13/20 1233  BP: 139/61 (!) 154/57 (!) 145/52 (!) 123/55  Pulse: (!) 55 72 68 65  Resp: 20   20  Temp: 97.7 F (36.5 C)   98.6 F (37 C)  TempSrc: Axillary   Axillary  SpO2: 98%  99% 100%  Weight:      Height:        Intake/Output Summary (Last 24 hours) at 09/13/2020 1252 Last data filed at 09/13/2020 0500 Gross per 24 hour  Intake 350 ml  Output 800 ml  Net -450 ml   Filed Weights   09/12/20 1802  Weight: 59 kg    Examination:  General exam: Frail elderly lady, appears calm and comfortable  Respiratory system: Decreased breath sound at left base. Respiratory effort normal. Cardiovascular system: S1 & S2 heard,  RRR.  Gastrointestinal system: Soft, nontender, nondistended, bowel sounds positive. Central nervous system: Alert and oriented. No focal neurological deficits. Extremities: No edema, no cyanosis, pulses intact and symmetrical. Psychiatry: Judgement and insight appear normal.   DVT prophylaxis: Lovenox Code Status: Full Family Communication: Daughter was updated at  bedside. Disposition Plan:  Status is: Inpatient  Remains inpatient appropriate because:Inpatient level of care appropriate due to severity of illness  Dispo: The patient is from: Home              Anticipated d/c is to: Home              Patient currently is not medically stable to d/c.   Difficult to place patient No              Level of care: Telemetry  All the records are reviewed and case discussed with Care Management/Social Worker. Management plans discussed with the patient, nursing and they are in agreement.  Consultants:  None  Procedures:  Antimicrobials:  Zithromax Ceftriaxone Remdesivir  Data Reviewed: I have personally reviewed following labs and imaging studies  CBC: Recent Labs  Lab 09/12/20 1804 09/13/20 0351  WBC 6.0 4.9  4.7  NEUTROABS 3.9  --   HGB 12.2 11.6*  11.6*  HCT 38.4 37.4  36.8  MCV 94.1 95.4  94.6  PLT 264 233  539   Basic Metabolic Panel: Recent Labs  Lab 09/12/20 1804 09/13/20 0351  NA 131* 134*  K 4.2 4.0  CL 94* 93*  CO2 27 30  GLUCOSE 108* 81  BUN 16 17  CREATININE 0.68 0.66  0.60  CALCIUM 8.0* 7.8*   GFR: Estimated Creatinine Clearance: 38.7 mL/min (by C-G formula based on SCr of 0.6 mg/dL). Liver Function Tests: Recent Labs  Lab 09/12/20 1804  AST 37  ALT 25  ALKPHOS 145*  BILITOT 0.6  PROT 5.9*  ALBUMIN 3.1*   Recent Labs  Lab 09/12/20 1804  LIPASE 27   No results for input(s): AMMONIA in the last 168 hours. Coagulation Profile: No results for input(s): INR, PROTIME in the last 168 hours. Cardiac Enzymes: No results for input(s): CKTOTAL, CKMB, CKMBINDEX, TROPONINI in the last 168 hours. BNP (last 3 results) No results for input(s): PROBNP in the last 8760 hours. HbA1C: No results for input(s): HGBA1C in the last 72 hours. CBG: No results for input(s): GLUCAP in the last 168 hours. Lipid Profile: No results for input(s): CHOL, HDL, LDLCALC, TRIG, CHOLHDL, LDLDIRECT in the last 72  hours. Thyroid Function Tests: No results for input(s): TSH, T4TOTAL, FREET4, T3FREE, THYROIDAB in the last 72 hours. Anemia Panel: No results for input(s): VITAMINB12, FOLATE, FERRITIN, TIBC, IRON, RETICCTPCT in the last 72 hours. Sepsis Labs: No results for input(s): PROCALCITON, LATICACIDVEN in the last 168 hours.  Recent Results (from the past 240 hour(s))  Resp Panel by RT-PCR (Flu A&B, Covid) Nasopharyngeal Swab     Status: Abnormal   Collection Time: 09/12/20  6:04 PM   Specimen: Nasopharyngeal Swab; Nasopharyngeal(NP) swabs in vial transport medium  Result Value Ref Range Status   SARS Coronavirus 2 by RT PCR POSITIVE (A) NEGATIVE Final    Comment: RESULT CALLED TO, READ BACK BY AND VERIFIED WITH: HILARY, RN @ [phone] ON 09/12/20 C VARNER (NOTE) SARS-CoV-2 target nucleic acids are DETECTED.  The SARS-CoV-2 RNA is generally detectable in upper respiratory specimens during the acute phase of infection. Positive results are indicative of the presence of the identified virus, but do not rule  out bacterial infection or co-infection with other pathogens not detected by the test. Clinical correlation with patient history and other diagnostic information is necessary to determine patient infection status. The expected result is Negative.  Fact Sheet for Patients: EntrepreneurPulse.com.au  Fact Sheet for Healthcare Providers: IncredibleEmployment.be  This test is not yet approved or cleared by the [place] FDA and  has been authorized for detection and/or diagnosis of SARS-CoV-2 by FDA under an Emergency Use Authorization (EUA).  This EUA will remain in effect (meaning this test ca n be used) for the duration of  the COVID-19 declaration under Section 564(b)(1) of the Act, 21 U.S.C. section 360bbb-3(b)(1), unless the authorization is terminated or revoked sooner.     Influenza A by PCR NEGATIVE NEGATIVE Final   Influenza B by PCR NEGATIVE  NEGATIVE Final    Comment: (NOTE) The Xpert Xpress SARS-CoV-2/FLU/RSV plus assay is intended as an aid in the diagnosis of influenza from Nasopharyngeal swab specimens and should not be used as a sole basis for treatment. Nasal washings and aspirates are unacceptable for Xpert Xpress SARS-CoV-2/FLU/RSV testing.  Fact Sheet for Patients: EntrepreneurPulse.com.au  Fact Sheet for Healthcare Providers: IncredibleEmployment.be  This test is not yet approved or cleared by the [place] FDA and has been authorized for detection and/or diagnosis of SARS-CoV-2 by FDA under an Emergency Use Authorization (EUA). This EUA will remain in effect (meaning this test can be used) for the duration of the COVID-19 declaration under Section 564(b)(1) of the Act, 21 U.S.C. section 360bbb-3(b)(1), unless the authorization is terminated or revoked.  Performed at [facility], [place] [address], [place], [place] [address]   Blood culture (routine x 2)     Status: None (Preliminary result)   Collection Time: 09/12/20  6:25 PM   Specimen: BLOOD RIGHT FOREARM  Result Value Ref Range Status   Specimen Description   Final    BLOOD RIGHT FOREARM Performed at [facility] [address], [place], [place] [address]    Special Requests   Final    BOTTLES DRAWN AEROBIC ONLY Blood Culture results may not be optimal due to an inadequate volume of blood received in culture bottles Performed at [facility] [address], [place], [place] [address]    Culture   Final    NO GROWTH < 12 HOURS Performed at [facility] [address] [place], [place] [address]    Report Status PENDING  Incomplete  Blood culture (routine x 2)     Status: None (Preliminary result)   Collection Time: 09/12/20  6:35 PM   Specimen: BLOOD  Result Value Ref Range Status   Specimen Description   Final    BLOOD LEFT  ANTECUBITAL Performed at [facility] [address], [place], [place] [address]    Special Requests   Final    BOTTLES DRAWN AEROBIC AND ANAEROBIC Blood Culture adequate volume Performed at [facility] [address] [place], [place] [address]    Culture   Final    NO GROWTH < 12 HOURS Performed at [facility] [address] [place], [place] [address]    Report Status PENDING  Incomplete     Radiology Studies: CT Head Wo Contrast  Result Date: 09/12/2020 CLINICAL DATA:  87 year old female with delirium. EXAM: CT HEAD WITHOUT CONTRAST TECHNIQUE: Contiguous axial images were obtained from the base of the skull through the vertex without intravenous contrast. COMPARISON:  None. FINDINGS: Brain: There is mild age-related atrophy and advanced chronic microvascular ischemic changes. There is no acute intracranial hemorrhage. No mass effect or midline shift. No extra-axial fluid collection. Vascular: No hyperdense vessel or unexpected calcification. Skull: Normal. Negative for fracture or focal lesion. Sinuses/Orbits: No acute finding. Other: None IMPRESSION: 1. No acute intracranial pathology. 2. Age-related atrophy and advanced chronic microvascular ischemic changes. Electronically Signed   By: Anner Crete M.D.   On: 09/12/2020 20:30   CT CHEST WO CONTRAST  Result Date: 09/12/2020 CLINICAL DATA:  Abnormal x-ray EXAM: CT CHEST WITHOUT CONTRAST TECHNIQUE: Multidetector CT imaging of the chest was performed following the standard protocol without IV contrast. COMPARISON:  Chest x-ray earlier today FINDINGS: Cardiovascular: Heart is normal size. Aorta is normal caliber. Coronary artery and aortic calcifications. No aneurysm. Mediastinum/Nodes: No mediastinal, hilar, or axillary adenopathy. Trachea and esophagus are unremarkable. Thyroid unremarkable. Lungs/Pleura: Large left pleural effusion and small right pleural effusion. Compressive atelectasis  in the left lower lobe. Atelectasis or infiltrates in the right lower lobe. Upper Abdomen: Imaging into the upper abdomen demonstrates no acute findings. Musculoskeletal: Chest wall soft tissues are unremarkable. No acute bony abnormality. IMPRESSION: Large left pleural effusion and small right pleural effusion. Compressive atelectasis in the left lower lobe. Right basilar atelectasis or developing infiltrate. Coronary artery disease. Aortic Atherosclerosis (ICD10-I70.0). Electronically Signed   By: Rolm Baptise M.D.   On: 09/12/2020 22:11   DG Chest Port 1 View  Result Date: 09/13/2020 CLINICAL DATA:  Status post thoracentesis EXAM: PORTABLE CHEST 1 VIEW COMPARISON:  September 12, 2020 chest radiograph and chest CT FINDINGS: No appreciable pneumothorax. Left pleural effusion much smaller after thoracentesis. Small residual pleural effusion on the left. There is consolidation in the left lower lobe. There is postoperative change in the right perihilar region. Right lung otherwise clear. Heart is upper normal in size with pulmonary vascularity normal. No adenopathy. No bone lesions. IMPRESSION: No evident pneumothorax. Small residual left pleural effusion. Atelectasis left lower lobe. A degree of superimposed pneumonia left lower lobe cannot be excluded. Postoperative change on the right without edema or airspace opacity. Stable cardiac silhouette. Electronically Signed   By: Lowella Grip III M.D.   On: 09/13/2020 11:57   DG Chest Portable 1 View  Result Date: 09/12/2020 CLINICAL DATA:  Hypoxia.  Cough and weakness. EXAM: PORTABLE CHEST 1 VIEW COMPARISON:  None. FINDINGS: The patient is rotated to the right on today's radiograph, reducing diagnostic sensitivity and specificity. Large left pleural effusion with a easy opacity in the left hemithorax partially attributable to the pleural effusion and likely partially attributable to the rightward rotation. Postoperative findings along the right hilum. Hazy  density in the right apex potentially reflecting atelectasis or pneumonia although there is substantial superimposition of osseous shadows in this region which may be contributory. Descending thoracic aortic atherosclerotic calcification. Bony demineralization. IMPRESSION: 1. The patient is rotated to the right on today's radiograph, reducing diagnostic sensitivity and specificity. 2. Large left pleural effusion with passive atelectasis. Difficult to exclude superimposed left pneumonia. 3. Indistinct density at the right lung apex, airspace opacity versus superimposition of osseous shadows. 4.  Aortic Atherosclerosis (ICD10-I70.0). 5. Consider chest CT for further characterization if symptoms warrant. Electronically Signed   By: Van Clines M.D.   On: 09/12/2020 20:25   US THORACENTESIS ASP PLEURAL SPACE W/IMG GUIDE  Result Date: 09/13/2020 INDICATION: Patient with history of COPD, HF, dyspnea, hypoxia, and large left pleural effusion. Request is made for diagnostic and therapeutic  left thoracentesis. EXAM: ULTRASOUND GUIDED DIAGNOSTIC AND THERAPEUTIC LEFT THORACENTESIS MEDICATIONS: 8 mL 1% lidocaine COMPLICATIONS: None immediate. PROCEDURE: An ultrasound guided thoracentesis was thoroughly discussed with the patient and questions answered. The benefits, risks, alternatives and complications were also discussed. The patient understands and wishes to proceed with the procedure. Written consent was obtained. Ultrasound was performed to localize and mark an adequate pocket of fluid in the left chest. The area was then prepped and draped in the normal sterile fashion. 1% Lidocaine was used for local anesthesia. Under ultrasound guidance a 6 Fr Safe-T-Centesis catheter was introduced. Thoracentesis was performed. The catheter was removed and a dressing applied. FINDINGS: A total of approximately 850 mL of hazy gold fluid was removed. Samples were sent to the laboratory as requested by the clinical team.  IMPRESSION: Successful ultrasound guided left thoracentesis yielding 850 mL of pleural fluid. Read by: Earley Abide, PA-C Electronically Signed   By: Markus Daft M.D.   On: 09/13/2020 12:17    Scheduled Meds:  enoxaparin (LOVENOX) injection  40 mg Subcutaneous Q24H   furosemide  40 mg Intravenous q AM   lidocaine       mometasone-formoterol  2 puff Inhalation BID   Continuous Infusions:  azithromycin Stopped (09/13/20 0531)   cefTRIAXone (ROCEPHIN)  IV Stopped (09/13/20 0530)     LOS: 1 day   Time spent: 38 minutes. More than 50% of the time was spent in counseling/coordination of care  Lorella Nimrod, MD [facility]  If 7PM-7AM, please contact night-coverage [email or web address]  09/13/2020, 12:52 PM   This record has been created using Systems analyst. Errors have been sought and corrected,but may not always be located. Such creation errors do not reflect on the standard of care.

## 2020-09-14 ENCOUNTER — Inpatient Hospital Stay (HOSPITAL_COMMUNITY): Payer: Medicare Other

## 2020-09-14 ENCOUNTER — Other Ambulatory Visit: Payer: Self-pay

## 2020-09-14 DIAGNOSIS — J90 Pleural effusion, not elsewhere classified: Principal | ICD-10-CM

## 2020-09-14 DIAGNOSIS — R0602 Shortness of breath: Secondary | ICD-10-CM

## 2020-09-14 LAB — CBC WITH DIFFERENTIAL/PLATELET
Abs Immature Granulocytes: 0.01 10*3/uL (ref 0.00–0.07)
Basophils Absolute: 0 10*3/uL (ref 0.0–0.1)
Basophils Relative: 0 %
Eosinophils Absolute: 0 10*3/uL (ref 0.0–0.5)
Eosinophils Relative: 0 %
HCT: 35.1 % — ABNORMAL LOW (ref 36.0–46.0)
Hemoglobin: 11.8 g/dL — ABNORMAL LOW (ref 12.0–15.0)
Immature Granulocytes: 0 %
Lymphocytes Relative: 30 %
Lymphs Abs: 0.9 10*3/uL (ref 0.7–4.0)
MCH: 30.3 pg (ref 26.0–34.0)
MCHC: 33.6 g/dL (ref 30.0–36.0)
MCV: 90.2 fL (ref 80.0–100.0)
Monocytes Absolute: 0.2 10*3/uL (ref 0.1–1.0)
Monocytes Relative: 5 %
Neutro Abs: 2 10*3/uL (ref 1.7–7.7)
Neutrophils Relative %: 65 %
Platelets: 239 10*3/uL (ref 150–400)
RBC: 3.89 MIL/uL (ref 3.87–5.11)
RDW: 13 % (ref 11.5–15.5)
WBC: 3.1 10*3/uL — ABNORMAL LOW (ref 4.0–10.5)
nRBC: 0 % (ref 0.0–0.2)

## 2020-09-14 LAB — COMPREHENSIVE METABOLIC PANEL
ALT: 24 U/L (ref 0–44)
AST: 32 U/L (ref 15–41)
Albumin: 2.7 g/dL — ABNORMAL LOW (ref 3.5–5.0)
Alkaline Phosphatase: 127 U/L — ABNORMAL HIGH (ref 38–126)
Anion gap: 9 (ref 5–15)
BUN: 22 mg/dL (ref 8–23)
CO2: 32 mmol/L (ref 22–32)
Calcium: 7.9 mg/dL — ABNORMAL LOW (ref 8.9–10.3)
Chloride: 94 mmol/L — ABNORMAL LOW (ref 98–111)
Creatinine, Ser: 0.63 mg/dL (ref 0.44–1.00)
GFR, Estimated: >60 mL/min (ref >60)
Glucose, Bld: 136 mg/dL — ABNORMAL HIGH (ref 70–99)
Potassium: 4 mmol/L (ref 3.5–5.1)
Sodium: 135 mmol/L (ref 135–145)
Total Bilirubin: 0.5 mg/dL (ref 0.3–1.2)
Total Protein: 5.4 g/dL — ABNORMAL LOW (ref 6.5–8.1)

## 2020-09-14 LAB — CYTOLOGY - NON PAP

## 2020-09-14 LAB — URINE CULTURE: Culture: NO GROWTH

## 2020-09-14 LAB — C-REACTIVE PROTEIN: CRP: 1 mg/dL — ABNORMAL HIGH (ref <1.0)

## 2020-09-14 LAB — ECHOCARDIOGRAM COMPLETE
Area-P 1/2: 3.6 cm2
Height: 65 in
S' Lateral: 2.1 cm
Weight: 2080 oz

## 2020-09-14 LAB — MRSA PCR SCREENING: MRSA by PCR: NEGATIVE

## 2020-09-14 LAB — D-DIMER, QUANTITATIVE: D-Dimer, Quant: 3.23 ug/mL-FEU — ABNORMAL HIGH (ref 0.00–0.50)

## 2020-09-14 MED ORDER — DIGOXIN 125 MCG PO TABS
0.1250 mg | ORAL_TABLET | Freq: Every day | ORAL | Status: DC
  Administered 2020-09-14 – 2020-09-17 (×4): 0.125 mg via ORAL
  Filled 2020-09-14 (×4): qty 1

## 2020-09-14 MED ORDER — ENOXAPARIN SODIUM 40 MG/0.4ML IJ SOSY
40.0000 mg | PREFILLED_SYRINGE | INTRAMUSCULAR | Status: DC
  Administered 2020-09-14 – 2020-09-17 (×4): 40 mg via SUBCUTANEOUS
  Filled 2020-09-14 (×4): qty 0.4

## 2020-09-14 MED ORDER — METOPROLOL TARTRATE 25 MG PO TABS
25.0000 mg | ORAL_TABLET | Freq: Two times a day (BID) | ORAL | Status: DC
  Administered 2020-09-14 – 2020-09-16 (×5): 25 mg via ORAL
  Filled 2020-09-14 (×5): qty 1

## 2020-09-14 MED ORDER — FUROSEMIDE 20 MG PO TABS
20.0000 mg | ORAL_TABLET | Freq: Every day | ORAL | Status: DC
  Administered 2020-09-14 – 2020-09-17 (×4): 20 mg via ORAL
  Filled 2020-09-14 (×4): qty 1

## 2020-09-14 MED ORDER — PANTOPRAZOLE SODIUM 40 MG PO TBEC
40.0000 mg | DELAYED_RELEASE_TABLET | Freq: Two times a day (BID) | ORAL | Status: DC | PRN
  Administered 2020-09-15: 40 mg via ORAL
  Filled 2020-09-14: qty 1

## 2020-09-14 MED ORDER — ATORVASTATIN CALCIUM 10 MG PO TABS
10.0000 mg | ORAL_TABLET | Freq: Every evening | ORAL | Status: DC
  Administered 2020-09-14 – 2020-09-16 (×3): 10 mg via ORAL
  Filled 2020-09-14 (×3): qty 1

## 2020-09-14 MED ORDER — MELATONIN 3 MG PO TABS
6.0000 mg | ORAL_TABLET | Freq: Once | ORAL | Status: AC
  Administered 2020-09-14: 6 mg via ORAL
  Filled 2020-09-14: qty 2

## 2020-09-14 MED ORDER — FLECAINIDE ACETATE 50 MG PO TABS
75.0000 mg | ORAL_TABLET | Freq: Two times a day (BID) | ORAL | Status: DC
  Administered 2020-09-14 – 2020-09-17 (×7): 75 mg via ORAL
  Filled 2020-09-14 (×7): qty 2

## 2020-09-14 MED ORDER — HYDROXYZINE HCL 25 MG PO TABS
25.0000 mg | ORAL_TABLET | Freq: Three times a day (TID) | ORAL | Status: DC | PRN
  Administered 2020-09-14 – 2020-09-16 (×4): 25 mg via ORAL
  Filled 2020-09-14 (×4): qty 1

## 2020-09-14 NOTE — TOC Initial Note (Addendum)
Transition of Care [facility]) - Initial/Assessment Note    Patient Details  Name: Samantha Fitzgerald MRN: [id number] Date of Birth: 07-10-1924  Transition of Care (TOC) CM/SW Contact:    Joaquin Courts, RN Phone Number: 09/14/2020, 5:21 PM  Clinical Narrative: Spoke with patient and daughter Samantha Fitzgerald regarding dc planning.  Patient was at [facility] prior to admission for short term rehab.  Patient and daughter both express that they would like for patient to return and continue her rehab with the goal to regain her ambulatory status.  Daughter shares that she reached out to [facility] to discuss her moms return but did not mention her Covid + status.  CM reached out to [facility] rep and notified of positive status and wish to return.  Rep spoke with [facility] leadership and was able to confirm that facility can accept patient back into a private room for isolation, the earliest date for this is Monday July 4.  MD was updated regarding this information, daughter, [name], made aware as well.       FL2 faxed to facility.            Expected Discharge Plan: Skilled Nursing Facility Barriers to Discharge: No Barriers Identified   Patient Goals and CMS Choice Patient states their goals for this hospitalization and ongoing recovery are:: to return to [facility] CMS Medicare.gov Compare Post Acute Care list provided to:: Patient Choice offered to / list presented to : Patient  Expected Discharge Plan and Services Expected Discharge Plan: [place]   Discharge Planning Services: CM Consult                                          Prior Living Arrangements/Services   Lives with:: Self Patient language and need for interpreter reviewed:: Yes Do you feel safe going back to the place where you live?: Yes      Need for Family Participation in Patient Care: Yes (Comment) Care giver support system in place?: Yes (comment)   Criminal Activity/Legal Involvement  Pertinent to Current Situation/Hospitalization: No - Comment as needed  Activities of Daily Living Home Assistive Devices/Equipment: Cane (specify quad or straight), Walker (specify type), Oxygen ADL Screening (condition at time of admission) Patient's cognitive ability adequate to safely complete daily activities?: No Is the patient deaf or have difficulty hearing?: No Does the patient have difficulty seeing, even when wearing glasses/contacts?: No Does the patient have difficulty concentrating, remembering, or making decisions?: Yes Patient able to express need for assistance with ADLs?: Yes Does the patient have difficulty dressing or bathing?: Yes Independently performs ADLs?: No Communication: Independent Dressing (OT): Needs assistance Is this a change from baseline?: Pre-admission baseline Grooming: Needs assistance Is this a change from baseline?: Pre-admission baseline Feeding: Needs assistance Is this a change from baseline?: Pre-admission baseline Bathing: Needs assistance Is this a change from baseline?: Pre-admission baseline Toileting: Needs assistance Is this a change from baseline?: Pre-admission baseline In/Out Bed: Needs assistance Is this a change from baseline?: Pre-admission baseline Walks in Home: Needs assistance Is this a change from baseline?: Pre-admission baseline Does the patient have difficulty walking or climbing stairs?: Yes Weakness of Legs: Both Weakness of Arms/Hands: None  Permission Sought/Granted                  Emotional Assessment Appearance:: Appears stated age Attitude/Demeanor/Rapport: Engaged Affect (typically observed): Accepting  Orientation: : Oriented to Self   Psych Involvement: No (comment)  Admission diagnosis:  SOB (shortness of breath) [R06.02] Pleural effusion [J90] Pleural effusion on left [J90] Patient Active Problem List   Diagnosis Date Noted   Pleural effusion on left 09/12/2020   COPD (chronic obstructive  pulmonary disease) ([place]) 09/12/2020   Sick sinus syndrome ([place]) 09/12/2020   Hypoxia 09/12/2020   PCP:  System, Provider Not In Pharmacy:  No Pharmacies Listed    Social Determinants of Health (SDOH) Interventions    Readmission Risk Interventions No flowsheet data found.

## 2020-09-14 NOTE — NC FL2 (Signed)
[place] LEVEL OF CARE SCREENING TOOL     IDENTIFICATION  Patient Name: Samantha Fitzgerald Birthdate: 01/23/1925 Sex: female Admission Date (Current Location): 09/12/2020  [facility] and [place] Number:  Herbalist and Address:  [facility],  [place] [address], [place]      Provider Number: [id number]  Attending Physician Name and Address:  Damita Lack, MD  Relative Name and Phone Number:       Current Level of Care: Hospital Recommended Level of Care: [place] Prior Approval Number:    Date Approved/Denied:   PASRR Number: [id number]  Discharge Plan: SNF    Current Diagnoses: Patient Active Problem List   Diagnosis Date Noted   Pleural effusion on left 09/12/2020   COPD (chronic obstructive pulmonary disease) ([place]) 09/12/2020   Sick sinus syndrome ([place]) 09/12/2020   Hypoxia 09/12/2020    Orientation RESPIRATION BLADDER Height & Weight     Self  Normal Incontinent Weight: 59 kg Height:  5\' 5"  (165.1 cm)  BEHAVIORAL SYMPTOMS/MOOD NEUROLOGICAL BOWEL NUTRITION STATUS      Incontinent Diet  AMBULATORY STATUS COMMUNICATION OF NEEDS Skin   Extensive Assist Verbally Normal                       Personal Care Assistance Level of Assistance  Bathing, Dressing Bathing Assistance: Maximum assistance   Dressing Assistance: Maximum assistance     Functional Limitations Info             SPECIAL CARE FACTORS FREQUENCY  PT (By licensed PT), OT (By licensed OT)     PT Frequency: 5x weekly OT Frequency: 5x weekly            Contractures Contractures Info: Not present    Additional Factors Info  Code Status, Allergies Code Status Info: Full Allergies Info: amoxicillin, doxycycline, levofloxacin, morphine           Current Medications (09/14/2020):  This is the current hospital active medication list Current Facility-Administered Medications  Medication Dose Route Frequency  Provider Last Rate Last Admin   acetaminophen (TYLENOL) tablet 650 mg  650 mg Oral Q6H PRN Chotiner, Yevonne Aline, MD   650 mg at 09/13/20 2018   Or   acetaminophen (TYLENOL) suppository 650 mg  650 mg Rectal Q6H PRN Chotiner, Yevonne Aline, MD       albuterol (PROVENTIL) (2.5 MG/3ML) 0.083% nebulizer solution 2.5 mg  2.5 mg Nebulization Q2H PRN Chotiner, Yevonne Aline, MD       ascorbic acid (VITAMIN C) tablet 500 mg  500 mg Oral Daily Lorella Nimrod, MD   500 mg at 09/14/20 0859   atorvastatin (LIPITOR) tablet 10 mg  10 mg Oral QPM Amin, Ankit Chirag, MD   10 mg at 09/14/20 1727   digoxin (LANOXIN) tablet 0.125 mg  0.125 mg Oral Daily Amin, Ankit Chirag, MD   0.125 mg at 09/14/20 1530   enoxaparin (LOVENOX) injection 40 mg  40 mg Subcutaneous Q24H Amin, Ankit Chirag, MD   40 mg at 09/14/20 0900   flecainide (TAMBOCOR) tablet 75 mg  75 mg Oral BID Amin, Ankit Chirag, MD   75 mg at 09/14/20 1529   furosemide (LASIX) tablet 20 mg  20 mg Oral Daily Amin, Ankit Chirag, MD   20 mg at 09/14/20 1531   guaiFENesin-dextromethorphan (ROBITUSSIN DM) 100-10 MG/5ML syrup 10 mL  10 mL Oral Q4H PRN Lorella Nimrod, MD  hydrOXYzine (ATARAX/VISTARIL) tablet 25 mg  25 mg Oral TID PRN Amin, Ankit Chirag, MD   25 mg at 09/14/20 1530   Ipratropium-Albuterol (COMBIVENT) respimat 1 puff  1 puff Inhalation Q6H Amin, Soundra Pilon, MD   1 puff at 09/14/20 1529   methylPREDNISolone sodium succinate (SOLU-MEDROL) 40 mg/mL injection 40 mg  40 mg Intravenous Daily Lorella Nimrod, MD   40 mg at 09/14/20 0900   metoprolol tartrate (LOPRESSOR) tablet 25 mg  25 mg Oral BID Amin, Ankit Chirag, MD   25 mg at 09/14/20 1531   mometasone-formoterol (DULERA) 200-5 MCG/ACT inhaler 2 puff  2 puff Inhalation BID Chotiner, Yevonne Aline, MD   2 puff at 09/14/20 0858   ondansetron (ZOFRAN) tablet 4 mg  4 mg Oral Q6H PRN Chotiner, Yevonne Aline, MD       Or   ondansetron (ZOFRAN) injection 4 mg  4 mg Intravenous Q6H PRN Chotiner, Yevonne Aline, MD   4 mg at 09/14/20  1113   pantoprazole (PROTONIX) EC tablet 40 mg  40 mg Oral Q12H PRN Amin, Jeanella Flattery, MD       remdesivir 100 mg in sodium chloride 0.9 % 100 mL IVPB  100 mg Intravenous Daily Lorella Nimrod, MD 200 mL/hr at 09/14/20 0857 100 mg at 09/14/20 0857   senna-docusate (Senokot-S) tablet 1 tablet  1 tablet Oral QHS PRN Chotiner, Yevonne Aline, MD       zinc sulfate capsule 220 mg  220 mg Oral Daily Lorella Nimrod, MD   220 mg at 09/14/20 0901     Discharge Medications: Please see discharge summary for a list of discharge medications.  Relevant Imaging Results:  Relevant Lab Results:   Additional Information SSN [id number]  Joaquin Courts, RN

## 2020-09-14 NOTE — Evaluation (Signed)
Physical Therapy Evaluation Patient Details Name: Samantha Fitzgerald MRN: [id number] DOB: [birth year] Today's Date: 09/14/2020      Clinical Impression  Samantha Fitzgerald is 87 y.o. female admitted with above HPI and diagnosis. Patient is currently limited by functional impairments below (see PT problem list). Patient admitted from [place] and prior to admission in May pt was independent with mobility at baseline. She currently requires Mod Assist for bed mobility and pt was limited this evaluation by weakness, nausea, and emesis. Patient will benefit from continued skilled PT interventions to address impairments and progress independence with mobility, recommending SNF follow up for therapy and 24/7 assist/supervision. Acute PT will follow and progress as able.         09/14/20 1000  PT Visit Information  Last PT Received On 09/14/20  Assistance Needed +1  History of Present Illness Patient is 87 y.o. female with PMH for COPD on 2L nasal cannula, sick sinus syndrome, CHF admitted for shortness of breath and dry cough.  Found to have large left-sided pleural effusion, COVID-19 positive.  Precautions  Precautions Fall (reports this is furt fall in last year)  Restrictions  Weight Bearing Restrictions No  Home Living  Family/patient expects to be discharged to: [place]  Available Help at Discharge Family;Available PRN/intermittently  Type of Home House  Home Access Stairs to enter  Entrance Stairs-Number of Steps 5  Entrance Stairs-Rails Right;Left  Home Layout Two level;Full bath on main level;Able to live on main level with bedroom/bathroom  Additional Comments Pt from [facility]; plans to stay at SNF for now and then transition to ILF or 24/7 nursing at home. Family's goal is for pt to move back to living alone at home.  Prior Function  Level of Independence Needs assistance  ADL's / Homemaking Assistance Needed Pt  co\mpletes her own sink baths.  Comments Prior to fall May 12th she was indepdnent without AD for gait and has been on 2L O2 for 2 years, but manaoing O2 herself. Pt's daughtetr does groceries and transpor for pt.She cooks frozen meals to heat up and makes sandwichs.  Communication  Communication HOH  Pain Assessment  Pain Assessment Faces  Faces Pain Scale 4  Pain Location generalized unwell feeling  Pain Descriptors / Indicators Discomfort  Pain Intervention(s) Limited activity within patient's tolerance;Monitored during session;Repositioned  Cognition  Arousal/Alertness Awake/alert  Behavior During Therapy WFL for tasks assessed/performed  Overall Cognitive Status Impaired/Different from baseline  Area of Impairment Orientation;Attention;Memory;Following commands;Safety/judgement;Awareness;Problem solving  Orientation Level Disoriented to;Situation;Time  Current Attention Level Selective  Memory Decreased short-term memory  Following Commands Follows one step commands consistently;Follows one step commands with increased time;Follows multi-step commands with increased time;Follows multi-step commands inconsistently  Safety/Judgement Decreased awareness of deficits  Awareness Emergent  Problem Solving Decreased initiation;Difficulty sequencing;Requires verbal cues;Requires tactile cues  Upper Extremity Assessment  Upper Extremity Assessment Generalized weakness  Lower Extremity Assessment  Lower Extremity Assessment Generalized weakness  Cervical / Trunk Assessment  Cervical / Trunk Assessment Kyphotic  Bed Mobility  Overal bed mobility Needs Assistance  Bed Mobility Supine to Sit;Sit to Supine  Supine to sit Mod assist;HOB elevated  Sit to supine Mod assist;Max assist  General bed mobility comments Mod assist to move to EOB with cues to initiate LE mobility and to raise trunk. Mod assist progressing to min for seated balance at EOB. Pt c/o feeling unwell and nauseous, Subsided  after return to supine and pt attempted supine<>sit second  time. Unwell nauseous feeling persisted and pt experience bout of emesis. RN notified and pt reposittioned in bed at EOS. VSS throughout.  Transfers  General transfer comment deferred  Balance  Overall balance assessment Needs assistance  Sitting-balance support Feet supported;Bilateral upper extremity supported  Sitting balance-Leahy Scale Poor  PT - End of Session  Equipment Utilized During Treatment Oxygen  Activity Tolerance Treatment limited secondary to medical complications (Comment) (N/V)  Patient left in bed;with call bell/phone within reach;with bed alarm set;with family/visitor present  Nurse Communication Mobility status  PT Assessment  PT Recommendation/Assessment Patient needs continued PT services  PT Visit Diagnosis Muscle weakness (generalized) (M62.81);Other abnormalities of gait and mobility (R26.89);Unsteadiness on feet (R26.81);Difficulty in walking, not elsewhere classified (R26.2)  PT Problem List Decreased strength;Decreased range of motion;Decreased activity tolerance;Decreased balance;Decreased mobility;Decreased knowledge of use of DME;Decreased safety awareness  PT Plan  PT Frequency (ACUTE ONLY) Min 2X/week  PT Treatment/Interventions (ACUTE ONLY) Gait training;DME instruction;Functional mobility training;Therapeutic activities;Therapeutic exercise;Balance training;Neuromuscular re-education;Patient/family education;Cognitive remediation  AM-PAC PT "6 Clicks" Mobility Outcome Measure (Version 2)  Help needed turning from your back to your side while in a flat bed without using bedrails? 3  Help needed moving from lying on your back to sitting on the side of a flat bed without using bedrails? 2  Help needed moving to and from a bed to a chair (including a wheelchair)? 1  Help needed standing up from a chair using your arms (e.g., wheelchair or bedside chair)? 1  Help needed to walk in hospital room? 1   Help needed climbing 3-5 steps with a railing?  1  6 Click Score 9  Consider Recommendation of Discharge To: CIR/SNF/LTACH  PT Recommendation  Follow Up Recommendations SNF  PT equipment None recommended by PT  Individuals Consulted  Consulted and Agree with Results and Recommendations Patient;Family member/caregiver  Family Member Consulted pt's daughter  Acute Rehab PT Goals  Patient Stated Goal regain strength and independence  PT Goal Formulation With patient/family  Time For Goal Achievement 09/28/20  Potential to Achieve Goals Fair  PT Time Calculation  PT Start Time (ACUTE ONLY) 1041  PT Stop Time (ACUTE ONLY) 1109  PT Time Calculation (min) (ACUTE ONLY) 28 min  PT General Charges  $$ ACUTE PT VISIT 1 Visit  PT Evaluation  $PT Eval Moderate Complexity 1 Mod  PT Treatments  $Therapeutic Activity 8-22 mins    Verner Mould, DPT Acute Rehabilitation Services Office [phone] Pager [phone]

## 2020-09-14 NOTE — Progress Notes (Signed)
  Echocardiogram 2D Echocardiogram has been performed.  Samantha Fitzgerald F 09/14/2020, 4:50 PM

## 2020-09-14 NOTE — Progress Notes (Signed)
Pt became restless after am Solumedrol, states she feel "jittery and little anxious, unable to wind down" MD made aware and order received. SRP, RN

## 2020-09-14 NOTE — Progress Notes (Signed)
PROGRESS NOTE    Samantha Fitzgerald  IWP:[id number] DOB: 19-Jun-1924 DOA: 09/12/2020 PCP: System, Provider Not In   Brief Narrative:  87 year old with history of COPD 2 L nasal cannula, sick sinus syndrome, CHF admitted for shortness of breath and dry cough.  Found to have large left-sided pleural effusion, COVID-19 positive.   Assessment & Plan:   Principal Problem:   Pleural effusion on left Active Problems:   COPD (chronic obstructive pulmonary disease) (HCC)   Sick sinus syndrome (HCC)   Hypoxia  Acute on chronic hypoxic respiratory failure -Multifactorial secondary to large left-sided pleural effusion and COVID-19 infection.  At baseline 2 L nasal cannula.  Currently on 3 L nasal cannula  Large left-sided pleural effusion-status postthoracentesis 6/30. 850 cc removed. -Pleural fluid studies-negative.  Follow-up culture- - Echocardiogram- pending Repeat CXR tomorrow.   COVID-19 infection -Oxygen levels- 3L [place] -Remdesivir-D2 - Solu-Medrol-D2 -Antibiotics-we will discontinue azithromycin and Rocephin -procalcitonin-negative -Chest x-ray-improved after thoracentesis -Supportive care-antitussive, inhalers, I-S/flutter -CODE STATUS confirmed - Vitamin C and zinc - Trend inflammatory markers  History of COPD - Bronchodilators  History of sick sinus syndrome - Rate is controlled.  Continue to monitor. On flecainide and digoxin at home. Prescribe both by Cardiology in [facility].    DVT prophylaxis:   Lovenox Code Status: Full code Family Communication:  Daughter updated.   Status is: Inpatient  Remains inpatient appropriate because:Inpatient level of care appropriate due to severity of illness  Dispo: The patient is from: Home              Anticipated d/c is to: Home              Patient currently is not medically stable to d/c. Currently on 3L [place]   Difficult to place patient No       Subjective: Feels tired, but feels little better than yesterday  after thoracentesis.   Review of Systems Otherwise negative except as per HPI, including: General: Denies fever, chills, night sweats or unintended weight loss. Resp: Denies cough, wheezing, shortness of breath. Cardiac: Denies chest pain, palpitations, orthopnea, paroxysmal nocturnal dyspnea. GI: Denies abdominal pain, nausea, vomiting, diarrhea or constipation GU: Denies dysuria, frequency, hesitancy or incontinence MS: Denies muscle aches, joint pain or swelling Neuro: Denies headache, neurologic deficits (focal weakness, numbness, tingling), abnormal gait Psych: Denies anxiety, depression, SI/HI/AVH Skin: Denies new rashes or lesions ID: Denies sick contacts, exotic exposures, travel  Examination:  General exam: Appears calm and comfortable. 3L [place]. Elderly.  Respiratory system: bibasilar crackles.  Cardiovascular system: S1 & S2 heard, RRR. No JVD, murmurs, rubs, gallops or clicks. No pedal edema. Gastrointestinal system: Abdomen is nondistended, soft and nontender. No organomegaly or masses felt. Normal bowel sounds heard. Central nervous system: Alert and oriented. No focal neurological deficits. Extremities: Symmetric 5 x 5 power. Skin: No rashes, lesions or ulcers Psychiatry: Judgement and insight appear normal. Mood & affect appropriate.     Objective: Vitals:   09/13/20 2100 09/14/20 0030 09/14/20 0300 09/14/20 0506  BP:  (!) 143/76 (!) 146/70 (!) 161/82  Pulse:  73 70 (!) 7  Resp:  20 20   Temp:  97.8 F (36.6 C) 97.8 F (36.6 C)   TempSrc:  Oral Oral   SpO2: 100% 100% 100%   Weight:      Height:        Intake/Output Summary (Last 24 hours) at 09/14/2020 0747 Last data filed at 09/14/2020 0700 Gross per 24 hour  Intake 880 ml  Output  700 ml  Net 180 ml   Filed Weights   09/12/20 1802  Weight: 59 kg     Data Reviewed:   CBC: Recent Labs  Lab 09/12/20 1804 09/13/20 0351 09/14/20 0321  WBC 6.0 4.9  4.7 3.1*  NEUTROABS 3.9  --  2.0  HGB 12.2 11.6*   11.6* 11.8*  HCT 38.4 37.4  36.8 35.1*  MCV 94.1 95.4  94.6 90.2  PLT 264 233  247 811   Basic Metabolic Panel: Recent Labs  Lab 09/12/20 1804 09/13/20 0351 09/14/20 0321  NA 131* 134* 135  K 4.2 4.0 4.0  CL 94* 93* 94*  CO2 27 30 32  GLUCOSE 108* 81 136*  BUN 16 17 22   CREATININE 0.68 0.66  0.60 0.63  CALCIUM 8.0* 7.8* 7.9*   GFR: Estimated Creatinine Clearance: 38.7 mL/min (by C-G formula based on SCr of 0.63 mg/dL). Liver Function Tests: Recent Labs  Lab 09/12/20 1804 09/14/20 0321  AST 37 32  ALT 25 24  ALKPHOS 145* 127*  BILITOT 0.6 0.5  PROT 5.9* 5.4*  ALBUMIN 3.1* 2.7*   Recent Labs  Lab 09/12/20 1804  LIPASE 27   No results for input(s): AMMONIA in the last 168 hours. Coagulation Profile: No results for input(s): INR, PROTIME in the last 168 hours. Cardiac Enzymes: No results for input(s): CKTOTAL, CKMB, CKMBINDEX, TROPONINI in the last 168 hours. BNP (last 3 results) No results for input(s): PROBNP in the last 8760 hours. HbA1C: No results for input(s): HGBA1C in the last 72 hours. CBG: No results for input(s): GLUCAP in the last 168 hours. Lipid Profile: No results for input(s): CHOL, HDL, LDLCALC, TRIG, CHOLHDL, LDLDIRECT in the last 72 hours. Thyroid Function Tests: No results for input(s): TSH, T4TOTAL, FREET4, T3FREE, THYROIDAB in the last 72 hours. Anemia Panel: No results for input(s): VITAMINB12, FOLATE, FERRITIN, TIBC, IRON, RETICCTPCT in the last 72 hours. Sepsis Labs: Recent Labs  Lab 09/13/20 1325  PROCALCITON <0.10    Recent Results (from the past 240 hour(s))  Resp Panel by RT-PCR (Flu A&B, Covid) Nasopharyngeal Swab     Status: Abnormal   Collection Time: 09/12/20  6:04 PM   Specimen: Nasopharyngeal Swab; Nasopharyngeal(NP) swabs in vial transport medium  Result Value Ref Range Status   SARS Coronavirus 2 by RT PCR POSITIVE (A) NEGATIVE Final    Comment: RESULT CALLED TO, READ BACK BY AND VERIFIED WITH: HILARY, RN @  [phone] ON 09/12/20 C VARNER (NOTE) SARS-CoV-2 target nucleic acids are DETECTED.  The SARS-CoV-2 RNA is generally detectable in upper respiratory specimens during the acute phase of infection. Positive results are indicative of the presence of the identified virus, but do not rule out bacterial infection or co-infection with other pathogens not detected by the test. Clinical correlation with patient history and other diagnostic information is necessary to determine patient infection status. The expected result is Negative.  Fact Sheet for Patients: EntrepreneurPulse.com.au  Fact Sheet for Healthcare Providers: IncredibleEmployment.be  This test is not yet approved or cleared by the [place] FDA and  has been authorized for detection and/or diagnosis of SARS-CoV-2 by FDA under an Emergency Use Authorization (EUA).  This EUA will remain in effect (meaning this test ca n be used) for the duration of  the COVID-19 declaration under Section 564(b)(1) of the Act, 21 U.S.C. section 360bbb-3(b)(1), unless the authorization is terminated or revoked sooner.     Influenza A by PCR NEGATIVE NEGATIVE Final   Influenza B by PCR NEGATIVE  NEGATIVE Final    Comment: (NOTE) The Xpert Xpress SARS-CoV-2/FLU/RSV plus assay is intended as an aid in the diagnosis of influenza from Nasopharyngeal swab specimens and should not be used as a sole basis for treatment. Nasal washings and aspirates are unacceptable for Xpert Xpress SARS-CoV-2/FLU/RSV testing.  Fact Sheet for Patients: EntrepreneurPulse.com.au  Fact Sheet for Healthcare Providers: IncredibleEmployment.be  This test is not yet approved or cleared by the [place] FDA and has been authorized for detection and/or diagnosis of SARS-CoV-2 by FDA under an Emergency Use Authorization (EUA). This EUA will remain in effect (meaning this test can be used) for the  duration of the COVID-19 declaration under Section 564(b)(1) of the Act, 21 U.S.C. section 360bbb-3(b)(1), unless the authorization is terminated or revoked.  Performed at [facility], [place] [address], [place], [place] [address]   Blood culture (routine x 2)     Status: None (Preliminary result)   Collection Time: 09/12/20  6:25 PM   Specimen: BLOOD RIGHT FOREARM  Result Value Ref Range Status   Specimen Description   Final    BLOOD RIGHT FOREARM Performed at [facility] [address], [place], [place] [address]    Special Requests   Final    BOTTLES DRAWN AEROBIC ONLY Blood Culture results may not be optimal due to an inadequate volume of blood received in culture bottles Performed at [facility] [address], [place], [place] [address]    Culture   Final    NO GROWTH 2 DAYS Performed at [facility] [address] [place], [place] [address]    Report Status PENDING  Incomplete  Blood culture (routine x 2)     Status: None (Preliminary result)   Collection Time: 09/12/20  6:35 PM   Specimen: BLOOD  Result Value Ref Range Status   Specimen Description   Final    BLOOD LEFT ANTECUBITAL Performed at [facility] [address] [place], [place] [address]    Special Requests   Final    BOTTLES DRAWN AEROBIC AND ANAEROBIC Blood Culture adequate volume Performed at [facility] [address], [place], [place] [address]    Culture   Final    NO GROWTH 2 DAYS Performed at [facility] [address] [place], [place] [address]    Report Status PENDING  Incomplete  Body fluid culture w Gram Stain     Status: None (Preliminary result)   Collection Time: 09/13/20 11:17 AM   Specimen: Lung, Left; Pleural Fluid  Result Value Ref Range Status   Specimen Description   Final    PLEURAL LEFT Performed at [facility] [address],  [place], [place] [address]    Special Requests   Final    NONE Performed at [facility], [place] [address], [place], [place] [address]    Gram Stain   Final    RARE WBC PRESENT, PREDOMINANTLY MONONUCLEAR NO ORGANISMS SEEN Performed at [facility], [place] [address] [place],  [address]    Culture PENDING  Incomplete   Report Status PENDING  Incomplete         Radiology Studies: CT Head Wo Contrast  Result Date: 09/12/2020 CLINICAL DATA:  87 year old female with delirium. EXAM: CT HEAD WITHOUT CONTRAST TECHNIQUE: Contiguous axial images were obtained from the base of the skull through the vertex without intravenous contrast. COMPARISON:  None. FINDINGS: Brain: There is  mild age-related atrophy and advanced chronic microvascular ischemic changes. There is no acute intracranial hemorrhage. No mass effect or midline shift. No extra-axial fluid collection. Vascular: No hyperdense vessel or unexpected calcification. Skull: Normal. Negative for fracture or focal lesion. Sinuses/Orbits: No acute finding. Other: None IMPRESSION: 1. No acute intracranial pathology. 2. Age-related atrophy and advanced chronic microvascular ischemic changes. Electronically Signed   By: Anner Crete M.D.   On: 09/12/2020 20:30   CT CHEST WO CONTRAST  Result Date: 09/12/2020 CLINICAL DATA:  Abnormal x-ray EXAM: CT CHEST WITHOUT CONTRAST TECHNIQUE: Multidetector CT imaging of the chest was performed following the standard protocol without IV contrast. COMPARISON:  Chest x-ray earlier today FINDINGS: Cardiovascular: Heart is normal size. Aorta is normal caliber. Coronary artery and aortic calcifications. No aneurysm. Mediastinum/Nodes: No mediastinal, hilar, or axillary adenopathy. Trachea and esophagus are unremarkable. Thyroid unremarkable. Lungs/Pleura: Large left pleural effusion and small right pleural effusion. Compressive atelectasis in the left lower lobe. Atelectasis or infiltrates in the  right lower lobe. Upper Abdomen: Imaging into the upper abdomen demonstrates no acute findings. Musculoskeletal: Chest wall soft tissues are unremarkable. No acute bony abnormality. IMPRESSION: Large left pleural effusion and small right pleural effusion. Compressive atelectasis in the left lower lobe. Right basilar atelectasis or developing infiltrate. Coronary artery disease. Aortic Atherosclerosis (ICD10-I70.0). Electronically Signed   By: Rolm Baptise M.D.   On: 09/12/2020 22:11   DG Chest Port 1 View  Result Date: 09/13/2020 CLINICAL DATA:  Status post thoracentesis EXAM: PORTABLE CHEST 1 VIEW COMPARISON:  September 12, 2020 chest radiograph and chest CT FINDINGS: No appreciable pneumothorax. Left pleural effusion much smaller after thoracentesis. Small residual pleural effusion on the left. There is consolidation in the left lower lobe. There is postoperative change in the right perihilar region. Right lung otherwise clear. Heart is upper normal in size with pulmonary vascularity normal. No adenopathy. No bone lesions. IMPRESSION: No evident pneumothorax. Small residual left pleural effusion. Atelectasis left lower lobe. A degree of superimposed pneumonia left lower lobe cannot be excluded. Postoperative change on the right without edema or airspace opacity. Stable cardiac silhouette. Electronically Signed   By: Lowella Grip III M.D.   On: 09/13/2020 11:57   DG Chest Portable 1 View  Result Date: 09/12/2020 CLINICAL DATA:  Hypoxia.  Cough and weakness. EXAM: PORTABLE CHEST 1 VIEW COMPARISON:  None. FINDINGS: The patient is rotated to the right on today's radiograph, reducing diagnostic sensitivity and specificity. Large left pleural effusion with a easy opacity in the left hemithorax partially attributable to the pleural effusion and likely partially attributable to the rightward rotation. Postoperative findings along the right hilum. Hazy density in the right apex potentially reflecting atelectasis or  pneumonia although there is substantial superimposition of osseous shadows in this region which may be contributory. Descending thoracic aortic atherosclerotic calcification. Bony demineralization. IMPRESSION: 1. The patient is rotated to the right on today's radiograph, reducing diagnostic sensitivity and specificity. 2. Large left pleural effusion with passive atelectasis. Difficult to exclude superimposed left pneumonia. 3. Indistinct density at the right lung apex, airspace opacity versus superimposition of osseous shadows. 4.  Aortic Atherosclerosis (ICD10-I70.0). 5. Consider chest CT for further characterization if symptoms warrant. Electronically Signed   By: Van Clines M.D.   On: 09/12/2020 20:25   US THORACENTESIS ASP PLEURAL SPACE W/IMG GUIDE  Result Date: 09/13/2020 INDICATION: Patient with history of COPD, HF, dyspnea, hypoxia, and large left pleural effusion. Request is made for diagnostic and therapeutic left thoracentesis. EXAM: ULTRASOUND GUIDED  DIAGNOSTIC AND THERAPEUTIC LEFT THORACENTESIS MEDICATIONS: 8 mL 1% lidocaine COMPLICATIONS: None immediate. PROCEDURE: An ultrasound guided thoracentesis was thoroughly discussed with the patient and questions answered. The benefits, risks, alternatives and complications were also discussed. The patient understands and wishes to proceed with the procedure. Written consent was obtained. Ultrasound was performed to localize and mark an adequate pocket of fluid in the left chest. The area was then prepped and draped in the normal sterile fashion. 1% Lidocaine was used for local anesthesia. Under ultrasound guidance a 6 Fr Safe-T-Centesis catheter was introduced. Thoracentesis was performed. The catheter was removed and a dressing applied. FINDINGS: A total of approximately 850 mL of hazy gold fluid was removed. Samples were sent to the laboratory as requested by the clinical team. IMPRESSION: Successful ultrasound guided left thoracentesis yielding  850 mL of pleural fluid. Read by: Earley Abide, PA-C Electronically Signed   By: Markus Daft M.D.   On: 09/13/2020 12:17        Scheduled Meds:  vitamin C  500 mg Oral Daily   enoxaparin (LOVENOX) injection  40 mg Subcutaneous Q24H   furosemide  40 mg Intravenous q AM   Ipratropium-Albuterol  1 puff Inhalation Q6H   methylPREDNISolone (SOLU-MEDROL) injection  40 mg Intravenous Daily   mometasone-formoterol  2 puff Inhalation BID   zinc sulfate  220 mg Oral Daily   Continuous Infusions:  azithromycin 500 mg (09/14/20 0022)   cefTRIAXone (ROCEPHIN)  IV 1 g (09/13/20 2341)   remdesivir 100 mg in NS 100 mL       LOS: 2 days   Time spent= 35 mins    Clorine Swing Arsenio Loader, MD [facility]  If 7PM-7AM, please contact night-coverage  09/14/2020, 7:47 AM

## 2020-09-14 NOTE — Plan of Care (Signed)

## 2020-09-15 ENCOUNTER — Inpatient Hospital Stay (HOSPITAL_COMMUNITY): Payer: Medicare Other

## 2020-09-15 LAB — BASIC METABOLIC PANEL
Anion gap: 11 (ref 5–15)
BUN: 18 mg/dL (ref 8–23)
CO2: 32 mmol/L (ref 22–32)
Calcium: 8.1 mg/dL — ABNORMAL LOW (ref 8.9–10.3)
Chloride: 95 mmol/L — ABNORMAL LOW (ref 98–111)
Creatinine, Ser: 0.5 mg/dL (ref 0.44–1.00)
GFR, Estimated: >60 mL/min (ref >60)
Glucose, Bld: 93 mg/dL (ref 70–99)
Potassium: 3.8 mmol/L (ref 3.5–5.1)
Sodium: 138 mmol/L (ref 135–145)

## 2020-09-15 LAB — BRAIN NATRIURETIC PEPTIDE: B Natriuretic Peptide: 238.6 pg/mL — ABNORMAL HIGH (ref 0.0–100.0)

## 2020-09-15 LAB — CBC
HCT: 35.9 % — ABNORMAL LOW (ref 36.0–46.0)
Hemoglobin: 11.5 g/dL — ABNORMAL LOW (ref 12.0–15.0)
MCH: 29.9 pg (ref 26.0–34.0)
MCHC: 32 g/dL (ref 30.0–36.0)
MCV: 93.2 fL (ref 80.0–100.0)
Platelets: 160 10*3/uL (ref 150–400)
RBC: 3.85 MIL/uL — ABNORMAL LOW (ref 3.87–5.11)
RDW: 13.1 % (ref 11.5–15.5)
WBC: 2.8 10*3/uL — ABNORMAL LOW (ref 4.0–10.5)
nRBC: 0 % (ref 0.0–0.2)

## 2020-09-15 LAB — D-DIMER, QUANTITATIVE: D-Dimer, Quant: 2.91 ug/mL-FEU — ABNORMAL HIGH (ref 0.00–0.50)

## 2020-09-15 LAB — FERRITIN: Ferritin: 134 ng/mL (ref 11–307)

## 2020-09-15 LAB — C-REACTIVE PROTEIN: CRP: 0.7 mg/dL (ref <1.0)

## 2020-09-15 LAB — LACTATE DEHYDROGENASE: LDH: 149 U/L (ref 98–192)

## 2020-09-15 MED ORDER — MECLIZINE HCL 25 MG PO TABS
25.0000 mg | ORAL_TABLET | Freq: Three times a day (TID) | ORAL | Status: DC
  Administered 2020-09-15 – 2020-09-16 (×4): 25 mg via ORAL
  Filled 2020-09-15 (×4): qty 1

## 2020-09-15 NOTE — Evaluation (Signed)
Occupational Therapy Evaluation Patient Details Name: Samantha Fitzgerald MRN: [id number] DOB: October 09, 1924 Today's Date: 09/15/2020    History of Present Illness Patient is 87 y.o. female with PMH for COPD on 2L nasal cannula, sick sinus syndrome, CHF admitted for shortness of breath and dry cough.  Found to have large left-sided pleural effusion, COVID-19 positive.   Clinical Impression   Samantha Fitzgerald is a 87 year old woman who presents with generalized weakness, decreased activity tolerance, impaired balance and complains of dizziness with transfers. Of note patient's LUE weaker than right, LLE weaker than RLE (though expected due to recent history of left hip fracture) but also exhibits weaker dorsiflexion. Patient appears to have left droop to her mouth and reports some dribbling from left side of her mouth for a while now - daughter who was in the room reports patient's face and mild left facial droop  appear "normal." Patient unable to report onset of "dribbling" at the mouth or when left side became weaker other than due to hip fracture - so impairments do not appear acute. Patient found on baseline 2 L [place]. Patient required min assist for transfer to side of bed and had immediate onset of dizziness and nausea. Patient unable to don socks though she did attempt. Limited by nausea and dizziness. No significant feeling of improvement while therapist in the room but able to tolerate standing and transfer to recliner with min assist. Patient's o2 sat dropped to 85% but recovered quickly. Overall limited by dizziness and nausea but unable to describe in anyway despite therapist providing her with descriptive words. Patient doesn't exhibit any overt vertigo symptoms or nystagmus and BP WFL after sitting up. Patient needing assistance for tranfers, ambulation minimal and needing more assistance for ADLs. Patient will benefit from skilled OT services while in hospital to improve deficits in order to  improve functional abilities. Recommend return to short term rehab at discharge.      Follow Up Recommendations  SNF    Equipment Recommendations  None recommended by OT    Recommendations for Other Services       Precautions / Restrictions Precautions Precautions: Fall Precaution Comments: complaints of dizziness with sitting up but can describe the feeling, also becomes nauseous Restrictions Weight Bearing Restrictions: No      Mobility Bed Mobility Overal bed mobility: Needs Assistance Bed Mobility: Supine to Sit     Supine to sit: Min assist;HOB elevated     General bed mobility comments: MIn assist for hand hold to transfer to edge of bed. Immeiately after transfer patient repors nausea and light headedness. BP WFL - not orthostatic. Overall feeling of "illness" in upright position.    Transfers Overall transfer level: Needs assistance Equipment used: Rolling walker (2 wheeled) Transfers: Sit to/from [facility] Sit to Stand: Min assist;From elevated surface Stand pivot transfers: Min assist       General transfer comment: MIn assist to stand and pivot to reclner. o2 sat 85% after activity on 2 L but rebounded back into 90s. Continued to report dizziness and nausea. Unable to detailedly described feeling in her head despite intense questioning of therapist.    Balance Overall balance assessment: Needs assistance Sitting-balance support: No upper extremity supported;Feet supported Sitting balance-Leahy Scale: Fair     Standing balance support: During functional activity;Bilateral upper extremity supported Standing balance-Leahy Scale: Poor  ADL either performed or assessed with clinical judgement   ADL Overall ADL's : Needs assistance/impaired Eating/Feeding: Set up;Sitting   Grooming: Set up;Sitting   Upper Body Bathing: Set up;Sitting   Lower Body Bathing: Maximal assistance;Sit to/from stand    Upper Body Dressing : Set up;Sitting   Lower Body Dressing: Moderate assistance;Sit to/from stand   Toilet Transfer: Minimal assistance;BSC;Stand-pivot   Toileting- Clothing Manipulation and Hygiene: Maximal assistance;Sit to/from stand       Functional mobility during ADLs: Minimal assistance;Rolling walker       Vision Patient Visual Report: No change from baseline       Perception     Praxis      Pertinent Vitals/Pain Pain Assessment: No/denies pain     Hand Dominance     Extremity/Trunk Assessment Upper Extremity Assessment Upper Extremity Assessment: RUE deficits/detail;LUE deficits/detail RUE Deficits / Details: WFL ROM, 4+/5 strength throughout RUE Sensation: WNL RUE Coordination: WNL (Able to perform finger to thumb, finger to nose. No ataxia or bradykinesia) LUE Deficits / Details: WFL ROM, 4-/5 shoulder, 4/5 elbow, 4/5 wrist, 4/5 grip LUE Sensation: WNL (No reports of numbness/tingling, or dull sensation.) LUE Coordination: WNL (Able to perform finger to thumb, finger to nose. No ataxia or bradykinesia.)   Lower Extremity Assessment Lower Extremity Assessment: LLE deficits/detail LLE Deficits / Details: Reports recent hip fracture resulting in weakness of left lower extremity. Left hip weaker than right. Dorsiflexion weak as well. LLE Sensation:  (reports right thigh sensation more than left - but no significant sensation deficits.)   Cervical / Trunk Assessment Cervical / Trunk Assessment: Kyphotic   Communication Communication Communication: HOH   Cognition Arousal/Alertness: Awake/alert Behavior During Therapy: WFL for tasks assessed/performed Overall Cognitive Status: Within Functional Limits for tasks assessed                                 General Comments: She's able to answer questions and follows commands. Appears to be "fuzzy" on the details and is unable to detailedly describe her symptoms.   General Comments        Exercises     Shoulder Instructions      Home Living Family/patient expects to be discharged to:: Skilled nursing facility Living Arrangements: Children Available Help at Discharge: Family;Available PRN/intermittently Type of Home: House Home Access: Stairs to enter [employer] of Steps: 5 Entrance Stairs-Rails: Right;Left Home Layout: Two level;Full bath on main level;Able to live on main level with bedroom/bathroom                   Additional Comments: Pt from [facility]; plans to stay at [facility] for now and then transition to ILF or 24/7 nursing at home. Family's goal is for pt to move back to living alone at home.      Prior Functioning/Environment Level of Independence: Needs assistance  Gait / Transfers Assistance Needed: Daughter reports she had been walking 30-40 ft at SNF wih assistance of therapist and chair follow. Has been limited by respiratory status. ADL's / Homemaking Assistance Needed: Pt co\mpletes her own sink baths.   Comments: Prior to fall May 12th she was indepdnent without AD for gait and has been on 2L O2 for 2 years, but manaoing O2 herself. Pt's daughtetr does groceries and transpor for pt.She cooks frozen meals to heat up and makes sandwichs.        OT Problem List: Decreased strength;Decreased activity tolerance;Impaired balance (  sitting and/or standing);Decreased knowledge of use of DME or AE;Cardiopulmonary status limiting activity      OT Treatment/Interventions: Self-care/ADL training;Therapeutic exercise;DME and/or AE instruction;Patient/family education;Balance training;Therapeutic activities    OT Goals(Current goals can be found in the care plan section) Acute Rehab OT Goals Patient Stated Goal: get stronger OT Goal Formulation: With patient Time For Goal Achievement: 09/29/20 Potential to Achieve Goals: Fair  OT Frequency: Min 2X/week   Barriers to D/C:            Co-evaluation              AM-PAC OT "6  Clicks" Daily Activity     Outcome Measure Help from another person eating meals?: A Little Help from another person taking care of personal grooming?: A Little Help from another person toileting, which includes using toliet, bedpan, or urinal?: A Lot Help from another person bathing (including washing, rinsing, drying)?: A Lot Help from another person to put on and taking off regular upper body clothing?: A Little Help from another person to put on and taking off regular lower body clothing?: A Lot 6 Click Score: 15   End of Session Equipment Utilized During Treatment: Rolling walker;Gait belt Nurse Communication: Mobility status  Activity Tolerance: Patient limited by fatigue;Other (comment) (nausea) Patient left:    OT Visit Diagnosis: Dizziness and giddiness (R42);Unsteadiness on feet (R26.81);Muscle weakness (generalized) (M62.81)                Time: 0315-9458 OT Time Calculation (min): 36 min Charges:  OT General Charges $OT Visit: 1 Visit OT Evaluation $OT Eval Moderate Complexity: 1 Mod OT Treatments $Therapeutic Activity: 8-22 mins  Carren Blakley, OTR/L [address] [phone] Pager: [place] 09/15/2020, 10:56 AM

## 2020-09-15 NOTE — Progress Notes (Signed)
PROGRESS NOTE    Samantha Fitzgerald  VOZ:[id number] DOB: [birth year] DOA: 09/12/2020 PCP: System, Provider Not In   Brief Narrative:  87 year old with history of COPD 2 L nasal cannula, sick sinus syndrome, CHF admitted for shortness of breath and dry cough.  Found to have large left-sided pleural effusion, COVID-19 positive.  Underwent thoracentesis 6/30, 850 cc removed.  She was started on steroids and remdesivir.   Assessment & Plan:   Principal Problem:   Pleural effusion on left Active Problems:   COPD (chronic obstructive pulmonary disease) (HCC)   Sick sinus syndrome (HCC)   Hypoxia  Acute on chronic hypoxic respiratory failure -Multifactorial secondary to large left-sided pleural effusion and COVID-19 infection.  At baseline 2 L nasal cannula.  Currently remains on 3 L nasal cannula  Large left-sided pleural effusion-status postthoracentesis 6/30. 850 cc removed. -Pleural fluid studies-negative.  Follow-up culture- - Echocardiogram-65%, grade 1 DD -Repeat chest x-ray 7/1 stable  COVID-19 infection -Oxygen levels- 3L [place] -Remdesivir-day 3 - Solu-Medrol-day 3 -Antibiotics-we will discontinue azithromycin and Rocephin -procalcitonin-negative -Chest x-ray-improved after thoracentesis -Supportive care-antitussive, inhalers, I-S/flutter -CODE STATUS confirmed - Vitamin C and zinc - Trend inflammatory markers  History of COPD - Bronchodilators  History of sick sinus syndrome - Rate is controlled.  Continue to monitor. On flecainide and digoxin at home. Prescribe both by Cardiology in [facility]   He should be able to go back to her on July 4 Monday.   DVT prophylaxis: enoxaparin (LOVENOX) injection 40 mg Start: 09/14/20 1000  Lovenox Code Status: Full code Family Communication: Family updated periodically  Status is: Inpatient  Remains inpatient appropriate because:Inpatient level of care appropriate due to severity of illness  Dispo: The patient is  from: Home              Anticipated d/c is to: Home              Patient currently is not medically stable to d/c. Currently on 3L [place].  Hopefully back to her facility on July 4   Difficult to place patient No       Subjective: Feels okay today overall.  Still has some coughing.  Review of Systems Otherwise negative except as per HPI, including: General: Denies fever, chills, night sweats or unintended weight loss. Resp: Denies hemoptysis Cardiac: Denies chest pain, palpitations, orthopnea, paroxysmal nocturnal dyspnea. GI: Denies abdominal pain, nausea, vomiting, diarrhea or constipation GU: Denies dysuria, frequency, hesitancy or incontinence MS: Denies muscle aches, joint pain or swelling Neuro: Denies headache, neurologic deficits (focal weakness, numbness, tingling), abnormal gait Psych: Denies anxiety, depression, SI/HI/AVH Skin: Denies new rashes or lesions ID: Denies sick contacts, exotic exposures, travel  Examination:  Constitutional: Not in acute distress.  Elderly frail.  3 L nasal cannula Respiratory: Bilateral bibasilar crackles Cardiovascular: Normal sinus rhythm, no rubs Abdomen: Nontender nondistended good bowel sounds Musculoskeletal: No edema noted Skin: No rashes seen Neurologic: CN 2-12 grossly intact.  And nonfocal Psychiatric: Normal judgment and insight. Alert and oriented x 3. Normal mood. Objective: Vitals:   09/14/20 1056 09/14/20 1143 09/14/20 2039 09/15/20 0537  BP: (!) 162/76 135/68 (!) 162/78 (!) 153/72  Pulse: 88 83 83 73  Resp:  20 20 20   Temp:  97.8 F (36.6 C) 98.7 F (37.1 C) 98.2 F (36.8 C)  TempSrc:  Axillary  Oral  SpO2:  92%  94%  Weight:      Height:        Intake/Output Summary (Last 24 hours) at  09/15/2020 1023 Last data filed at 09/15/2020 0600 Gross per 24 hour  Intake 1080 ml  Output 2100 ml  Net -1020 ml   Filed Weights   09/12/20 1802  Weight: 59 kg     Data Reviewed:   CBC: Recent Labs  Lab  09/12/20 1804 09/13/20 0351 09/14/20 0321 09/15/20 0347  WBC 6.0 4.9  4.7 3.1* 2.8*  NEUTROABS 3.9  --  2.0  --   HGB 12.2 11.6*  11.6* 11.8* 11.5*  HCT 38.4 37.4  36.8 35.1* 35.9*  MCV 94.1 95.4  94.6 90.2 93.2  PLT 264 233  247 239 400   Basic Metabolic Panel: Recent Labs  Lab 09/12/20 1804 09/13/20 0351 09/14/20 0321 09/15/20 0347  NA 131* 134* 135 138  K 4.2 4.0 4.0 3.8  CL 94* 93* 94* 95*  CO2 27 30 32 32  GLUCOSE 108* 81 136* 93  BUN 16 17 22 18   CREATININE 0.68 0.66  0.60 0.63 0.50  CALCIUM 8.0* 7.8* 7.9* 8.1*   GFR: Estimated Creatinine Clearance: 38.7 mL/min (by C-G formula based on SCr of 0.5 mg/dL). Liver Function Tests: Recent Labs  Lab 09/12/20 1804 09/14/20 0321  AST 37 32  ALT 25 24  ALKPHOS 145* 127*  BILITOT 0.6 0.5  PROT 5.9* 5.4*  ALBUMIN 3.1* 2.7*   Recent Labs  Lab 09/12/20 1804  LIPASE 27   No results for input(s): AMMONIA in the last 168 hours. Coagulation Profile: No results for input(s): INR, PROTIME in the last 168 hours. Cardiac Enzymes: No results for input(s): CKTOTAL, CKMB, CKMBINDEX, TROPONINI in the last 168 hours. BNP (last 3 results) No results for input(s): PROBNP in the last 8760 hours. HbA1C: No results for input(s): HGBA1C in the last 72 hours. CBG: No results for input(s): GLUCAP in the last 168 hours. Lipid Profile: No results for input(s): CHOL, HDL, LDLCALC, TRIG, CHOLHDL, LDLDIRECT in the last 72 hours. Thyroid Function Tests: No results for input(s): TSH, T4TOTAL, FREET4, T3FREE, THYROIDAB in the last 72 hours. Anemia Panel: Recent Labs    09/15/20 0347  FERRITIN 134   Sepsis Labs: Recent Labs  Lab 09/13/20 1325  PROCALCITON <0.10    Recent Results (from the past 240 hour(s))  Resp Panel by RT-PCR (Flu A&B, Covid) Nasopharyngeal Swab     Status: Abnormal   Collection Time: 09/12/20  6:04 PM   Specimen: Nasopharyngeal Swab; Nasopharyngeal(NP) swabs in vial transport medium  Result Value Ref  Range Status   SARS Coronavirus 2 by RT PCR POSITIVE (A) NEGATIVE Final    Comment: RESULT CALLED TO, READ BACK BY AND VERIFIED WITH: HILARY, RN @ [phone] ON 09/12/20 C VARNER (NOTE) SARS-CoV-2 target nucleic acids are DETECTED.  The SARS-CoV-2 RNA is generally detectable in upper respiratory specimens during the acute phase of infection. Positive results are indicative of the presence of the identified virus, but do not rule out bacterial infection or co-infection with other pathogens not detected by the test. Clinical correlation with patient history and other diagnostic information is necessary to determine patient infection status. The expected result is Negative.  Fact Sheet for Patients: EntrepreneurPulse.com.au  Fact Sheet for Healthcare Providers: IncredibleEmployment.be  This test is not yet approved or cleared by the [place] FDA and  has been authorized for detection and/or diagnosis of SARS-CoV-2 by FDA under an Emergency Use Authorization (EUA).  This EUA will remain in effect (meaning this test ca n be used) for the duration of  the COVID-19 declaration under  Section 564(b)(1) of the Act, 21 U.S.C. section 360bbb-3(b)(1), unless the authorization is terminated or revoked sooner.     Influenza A by PCR NEGATIVE NEGATIVE Final   Influenza B by PCR NEGATIVE NEGATIVE Final    Comment: (NOTE) The Xpert Xpress SARS-CoV-2/FLU/RSV plus assay is intended as an aid in the diagnosis of influenza from Nasopharyngeal swab specimens and should not be used as a sole basis for treatment. Nasal washings and aspirates are unacceptable for Xpert Xpress SARS-CoV-2/FLU/RSV testing.  Fact Sheet for Patients: EntrepreneurPulse.com.au  Fact Sheet for Healthcare Providers: IncredibleEmployment.be  This test is not yet approved or cleared by the [place] FDA and has been authorized for detection and/or  diagnosis of SARS-CoV-2 by FDA under an Emergency Use Authorization (EUA). This EUA will remain in effect (meaning this test can be used) for the duration of the COVID-19 declaration under Section 564(b)(1) of the Act, 21 U.S.C. section 360bbb-3(b)(1), unless the authorization is terminated or revoked.  Performed at [facility], [place] [address], [place], [place] [address]   Blood culture (routine x 2)     Status: None (Preliminary result)   Collection Time: 09/12/20  6:25 PM   Specimen: BLOOD RIGHT FOREARM  Result Value Ref Range Status   Specimen Description   Final    BLOOD RIGHT FOREARM Performed at [place] [address], [place], [place] [address]    Special Requests   Final    BOTTLES DRAWN AEROBIC ONLY Blood Culture results may not be optimal due to an inadequate volume of blood received in culture bottles Performed at [place] [address], [place], [place] [address]    Culture   Final    NO GROWTH 3 DAYS Performed at [facility], [place] [address] [place], [place] [address]    Report Status PENDING  Incomplete  Blood culture (routine x 2)     Status: None (Preliminary result)   Collection Time: 09/12/20  6:35 PM   Specimen: BLOOD  Result Value Ref Range Status   Specimen Description   Final    BLOOD LEFT ANTECUBITAL Performed at [place] [address], [place], [place] [address]    Special Requests   Final    BOTTLES DRAWN AEROBIC AND ANAEROBIC Blood Culture adequate volume Performed at [place] [address] [place], [place] [address]    Culture   Final    NO GROWTH 3 DAYS Performed at [facility], [place] [address] [place], [place] [address]    Report Status PENDING  Incomplete  Urine culture     Status: None   Collection Time: 09/12/20  8:00 PM   Specimen: Urine, Clean Catch  Result Value Ref Range Status   Specimen Description    Final    URINE, CLEAN CATCH Performed at [facility], [place] [address] [place],  [address]    Special Requests   Final    NONE Performed at [facility], [place] [address], [place],  [address]    Culture   Final    NO GROWTH Performed at [facility], [place] [address] [place],  [address]    Report Status 09/14/2020 FINAL  Final  Body fluid culture w Gram Stain     Status: None (Preliminary result)   Collection Time: 09/13/20 11:17 AM   Specimen: Lung, Left; Pleural Fluid  Result Value Ref Range Status  Specimen Description   Final    PLEURAL LEFT Performed at [place] [address], [place], [place] [address]    Special Requests   Final    NONE Performed at [facility], [place] [address], [place], [place] [address]    Gram Stain   Final    RARE WBC PRESENT, PREDOMINANTLY MONONUCLEAR NO ORGANISMS SEEN    Culture   Final    NO GROWTH 1 DAY Performed at [facility], [place] [address], [place], [place] [address]    Report Status PENDING  Incomplete  MRSA PCR Screening     Status: None   Collection Time: 09/14/20  1:22 PM  Result Value Ref Range Status   MRSA by PCR NEGATIVE NEGATIVE Final    Comment:        The GeneXpert MRSA Assay (FDA approved for NASAL specimens only), is one component of a comprehensive MRSA colonization surveillance program. It is not intended to diagnose MRSA infection nor to guide or monitor treatment for MRSA infections. Performed at [facility], [place] [address], [place], [place] [address]          Radiology Studies: [facility] Chest Port 1 View  Result Date: 09/15/2020 CLINICAL DATA:  Dyspnea. EXAM: PORTABLE CHEST 1 VIEW COMPARISON:  09/13/2020 FINDINGS: 0512 hours. Hyperexpansion suggests emphysema. Persistent left base collapse/consolidation with effusion, similar to prior. Atelectasis or scarring again noted at the  right base. Bones are demineralized. The cardio pericardial silhouette is enlarged. IMPRESSION: No substantial change. Left base collapse/consolidation with effusion. Electronically Signed   By: Misty [place] M.D.   On: 09/15/2020 07:50   DG Chest Port 1 View  Result Date: 09/13/2020 CLINICAL DATA:  Status post thoracentesis EXAM: PORTABLE CHEST 1 VIEW COMPARISON:  September 12, 2020 chest radiograph and chest CT FINDINGS: No appreciable pneumothorax. Left pleural effusion much smaller after thoracentesis. Small residual pleural effusion on the left. There is consolidation in the left lower lobe. There is postoperative change in the right perihilar region. Right lung otherwise clear. Heart is upper normal in size with pulmonary vascularity normal. No adenopathy. No bone lesions. IMPRESSION: No evident pneumothorax. Small residual left pleural effusion. Atelectasis left lower lobe. A degree of superimposed pneumonia left lower lobe cannot be excluded. Postoperative change on the right without edema or airspace opacity. Stable cardiac silhouette. Electronically Signed   By: Lowella Grip III M.D.   On: 09/13/2020 11:57   ECHOCARDIOGRAM COMPLETE  Result Date: 09/14/2020    ECHOCARDIOGRAM REPORT   Patient Name:   Millard JON Sebek Date of Exam: 09/14/2020 Medical Rec #:  [id number]              Height:       65.0 in Accession #:    [id number]             Weight:       130.0 lb Date of Birth:  1924-12-24               BSA:          1.647 m Patient Age:    28 years               BP:           135/68 mmHg Patient Gender: F                      HR:  65 bpm. Exam Location:  Inpatient Procedure: 2D Echo, Limited Echo, Cardiac Doppler and Color Doppler Indications:    R06.02 SOB  History:        Patient has no prior history of Echocardiogram examinations.                 Signs/Symptoms:Shortness of Breath. Covid 19 positive.  Sonographer:    Merrie Roof RDCS Referring Phys: [id number] [place]  1. Left ventricular ejection fraction, by estimation, is 65 to 70%. The left ventricle has normal function. The left ventricle has no regional wall motion abnormalities. Left ventricular diastolic parameters are consistent with Grade I diastolic dysfunction (impaired relaxation).  2. Right ventricular systolic function is normal. The right ventricular size is not well visualized.  3. The mitral valve is grossly normal. No evidence of mitral valve regurgitation.  4. The aortic valve is grossly normal. Aortic valve regurgitation is trivial. FINDINGS  Left Ventricle: Left ventricular ejection fraction, by estimation, is 65 to 70%. The left ventricle has normal function. The left ventricle has no regional wall motion abnormalities. The left ventricular internal cavity size was small. There is no left ventricular hypertrophy. Left ventricular diastolic parameters are consistent with Grade I diastolic dysfunction (impaired relaxation). Right Ventricle: The right ventricular size is not well visualized. Right vetricular wall thickness was not well visualized. Right ventricular systolic function is normal. Left Atrium: Left atrial size was not well visualized. Right Atrium: Right atrial size was not well visualized. Pericardium: The pericardium was not well visualized. Mitral Valve: The mitral valve is grossly normal. No evidence of mitral valve regurgitation. Tricuspid Valve: The tricuspid valve is not well visualized. Tricuspid valve regurgitation is not demonstrated. Aortic Valve: The aortic valve is grossly normal. Aortic valve regurgitation is trivial. Pulmonic Valve: The pulmonic valve was not well visualized. Pulmonic valve regurgitation is not visualized. Aorta: The aortic root and ascending aorta are structurally normal, with no evidence of dilitation. IAS/Shunts: The interatrial septum was not well visualized.  LEFT VENTRICLE PLAX 2D LVIDd:         3.10 cm Diastology LVIDs:         2.10 cm LV e'  medial:    4.90 cm/s LV PW:         0.80 cm LV E/e' medial:  14.1 LV IVS:        0.80 cm LV e' lateral:   4.90 cm/s                        LV E/e' lateral: 14.1  LEFT ATRIUM         Index LA diam:    3.40 cm 2.06 cm/m   AORTA Ao Root diam: 2.50 cm MITRAL VALVE MV Area (PHT): 3.60 cm MV Decel Time: 211 msec MV E velocity: 69.00 cm/s MV A velocity: 89.10 cm/s MV E/A ratio:  0.77 Mertie Moores MD Electronically signed by Mertie Moores MD Signature Date/Time: 09/14/2020/5:02:14 PM    Final    US THORACENTESIS ASP PLEURAL SPACE W/IMG GUIDE  Result Date: 09/13/2020 INDICATION: Patient with history of COPD, HF, dyspnea, hypoxia, and large left pleural effusion. Request is made for diagnostic and therapeutic left thoracentesis. EXAM: ULTRASOUND GUIDED DIAGNOSTIC AND THERAPEUTIC LEFT THORACENTESIS MEDICATIONS: 8 mL 1% lidocaine COMPLICATIONS: None immediate. PROCEDURE: An ultrasound guided thoracentesis was thoroughly discussed with the patient and questions answered. The benefits, risks, alternatives and complications were also discussed. The patient understands and wishes to proceed  with the procedure. Written consent was obtained. Ultrasound was performed to localize and mark an adequate pocket of fluid in the left chest. The area was then prepped and draped in the normal sterile fashion. 1% Lidocaine was used for local anesthesia. Under ultrasound guidance a 6 Fr Safe-T-Centesis catheter was introduced. Thoracentesis was performed. The catheter was removed and a dressing applied. FINDINGS: A total of approximately 850 mL of hazy gold fluid was removed. Samples were sent to the laboratory as requested by the clinical team. IMPRESSION: Successful ultrasound guided left thoracentesis yielding 850 mL of pleural fluid. Read by: Earley Abide, PA-C Electronically Signed   By: Markus Daft M.D.   On: 09/13/2020 12:17        Scheduled Meds:  vitamin C  500 mg Oral Daily   atorvastatin  10 mg Oral QPM   digoxin  0.125  mg Oral Daily   enoxaparin (LOVENOX) injection  40 mg Subcutaneous Q24H   flecainide  75 mg Oral BID   furosemide  20 mg Oral Daily   Ipratropium-Albuterol  1 puff Inhalation Q6H   methylPREDNISolone (SOLU-MEDROL) injection  40 mg Intravenous Daily   metoprolol tartrate  25 mg Oral BID   mometasone-formoterol  2 puff Inhalation BID   zinc sulfate  220 mg Oral Daily   Continuous Infusions:  remdesivir 100 mg in NS 100 mL 100 mg (09/15/20 0958)     LOS: 3 days   Time spent= 35 mins    Perle Gibbon Arsenio Loader, MD [facility]  If 7PM-7AM, please contact night-coverage  09/15/2020, 10:23 AM

## 2020-09-16 LAB — CBC
HCT: 36.9 % (ref 36.0–46.0)
Hemoglobin: 11.6 g/dL — ABNORMAL LOW (ref 12.0–15.0)
MCH: 29.7 pg (ref 26.0–34.0)
MCHC: 31.4 g/dL (ref 30.0–36.0)
MCV: 94.4 fL (ref 80.0–100.0)
Platelets: 255 10*3/uL (ref 150–400)
RBC: 3.91 MIL/uL (ref 3.87–5.11)
RDW: 13.2 % (ref 11.5–15.5)
WBC: 3.9 10*3/uL — ABNORMAL LOW (ref 4.0–10.5)
nRBC: 0 % (ref 0.0–0.2)

## 2020-09-16 LAB — BASIC METABOLIC PANEL
Anion gap: 8 (ref 5–15)
BUN: 24 mg/dL — ABNORMAL HIGH (ref 8–23)
CO2: 36 mmol/L — ABNORMAL HIGH (ref 22–32)
Calcium: 7.9 mg/dL — ABNORMAL LOW (ref 8.9–10.3)
Chloride: 96 mmol/L — ABNORMAL LOW (ref 98–111)
Creatinine, Ser: 0.6 mg/dL (ref 0.44–1.00)
GFR, Estimated: >60 mL/min (ref >60)
Glucose, Bld: 92 mg/dL (ref 70–99)
Potassium: 3.6 mmol/L (ref 3.5–5.1)
Sodium: 140 mmol/L (ref 135–145)

## 2020-09-16 LAB — FERRITIN: Ferritin: 103 ng/mL (ref 11–307)

## 2020-09-16 LAB — C-REACTIVE PROTEIN: CRP: 0.7 mg/dL (ref <1.0)

## 2020-09-16 LAB — D-DIMER, QUANTITATIVE: D-Dimer, Quant: 2.74 ug/mL-FEU — ABNORMAL HIGH (ref 0.00–0.50)

## 2020-09-16 LAB — LACTATE DEHYDROGENASE: LDH: 144 U/L (ref 98–192)

## 2020-09-16 MED ORDER — POTASSIUM CHLORIDE 20 MEQ PO PACK
40.0000 meq | PACK | Freq: Once | ORAL | Status: AC
  Administered 2020-09-16: 40 meq via ORAL
  Filled 2020-09-16: qty 2

## 2020-09-16 MED ORDER — MECLIZINE HCL 25 MG PO TABS: 25.0000 mg | ORAL_TABLET | Freq: Three times a day (TID) | ORAL | Status: DC | PRN

## 2020-09-16 MED ORDER — MAGNESIUM OXIDE -MG SUPPLEMENT 400 (240 MG) MG PO TABS
800.0000 mg | ORAL_TABLET | Freq: Once | ORAL | Status: AC
  Administered 2020-09-16: 800 mg via ORAL
  Filled 2020-09-16: qty 2

## 2020-09-16 MED ORDER — ALPRAZOLAM 0.25 MG PO TABS
0.2500 mg | ORAL_TABLET | Freq: Once | ORAL | Status: AC
  Administered 2020-09-16: 0.25 mg via ORAL
  Filled 2020-09-16: qty 1

## 2020-09-16 NOTE — Progress Notes (Signed)
PROGRESS NOTE    Samantha Fitzgerald  URK:[id number] DOB: September 14, 1924 DOA: 09/12/2020 PCP: System, Provider Not In   Brief Narrative:  87 year old with history of COPD 2 L nasal cannula, sick sinus syndrome, CHF admitted for shortness of breath and dry cough.  Found to have large left-sided pleural effusion, COVID-19 positive.  Underwent thoracentesis 6/30, 850 cc removed.  She was started on steroids and remdesivir.   Assessment & Plan:   Principal Problem:   Pleural effusion on left Active Problems:   COPD (chronic obstructive pulmonary disease) (HCC)   Sick sinus syndrome (HCC)   Hypoxia  Acute on chronic hypoxic respiratory failure, improving -Multifactorial secondary to large left-sided pleural effusion and COVID-19 infection.  At baseline 2 L nasal cannula.  Currently between 2-3 L nasal cannula.  Slowly wean this off.  Large left-sided pleural effusion-status postthoracentesis 6/30. 850 cc removed. -Pleural fluid studies-negative.  Follow-up culture- - Echocardiogram-65%, grade 1 DD -Repeat chest x-ray 7/1 stable  COVID-19 infection, improving -Oxygen levels- 3L [place] -Remdesivir-day 4 - Solu-Medrol-day 4 -Antibiotics-we will discontinue azithromycin and Rocephin -procalcitonin-negative -Chest x-ray-improved after thoracentesis -Supportive care-antitussive, inhalers, I-S/flutter -CODE STATUS confirmed - Vitamin C and zinc - Trend inflammatory markers  History of COPD - Bronchodilators  History of sick sinus syndrome - Rate is controlled.  Continue to monitor. On flecainide and digoxin at home. Prescribe both by Cardiology in [facility].   Dizziness - Meclizine started  He should be able to go back to her on July 4 Monday.   DVT prophylaxis: enoxaparin (LOVENOX) injection 40 mg Start: 09/14/20 1000  Lovenox Code Status: Full code Family Communication: Family updated periodically  Status is: Inpatient  Remains inpatient appropriate because:Inpatient  level of care appropriate due to severity of illness  Dispo: The patient is from: Home              Anticipated d/c is to: Home              Patient currently is not medically stable to d/c. Currently on 3L [place].  Hopefully back to her facility on July 4   Difficult to place patient No       Subjective: Feeling okay no complaints.  Wants to get back to her facility.  Review of Systems Otherwise negative except as per HPI, including: General: Denies fever, chills, night sweats or unintended weight loss. Resp: Denies cough, wheezing, shortness of breath. Cardiac: Denies chest pain, palpitations, orthopnea, paroxysmal nocturnal dyspnea. GI: Denies abdominal pain, nausea, vomiting, diarrhea or constipation GU: Denies dysuria, frequency, hesitancy or incontinence MS: Denies muscle aches, joint pain or swelling Neuro: Denies headache, neurologic deficits (focal weakness, numbness, tingling), abnormal gait Psych: Denies anxiety, depression, SI/HI/AVH Skin: Denies new rashes or lesions ID: Denies sick contacts, exotic exposures, travel  Examination:  Constitutional: Not in acute distress, elderly frail.  2-3 L nasal cannula Respiratory: Clear to auscultation bilaterally Cardiovascular: Normal sinus rhythm, no rubs Abdomen: Nontender nondistended good bowel sounds Musculoskeletal: No edema noted Skin: No rashes seen Neurologic: CN 2-12 grossly intact.  And nonfocal Psychiatric: Normal judgment and insight. Alert and oriented x 3. Normal mood. Objective: Vitals:   09/15/20 0537 09/15/20 1305 09/15/20 2000 09/16/20 0655  BP: (!) 153/72 (!) 151/65 (!) 146/59 (!) 179/67  Pulse: 73 62 61 (!) 54  Resp: 20  18 20   Temp: 98.2 F (36.8 C) 97.8 F (36.6 C) 97.6 F (36.4 C) 98 F (36.7 C)  TempSrc: Oral Oral Oral Oral  SpO2: 94% 96%  96% 98%  Weight:      Height:        Intake/Output Summary (Last 24 hours) at 09/16/2020 1034 Last data filed at 09/16/2020 0657 Gross per 24 hour  Intake  --  Output 300 ml  Net -300 ml   Filed Weights   09/12/20 1802  Weight: 59 kg     Data Reviewed:   CBC: Recent Labs  Lab 09/12/20 1804 09/13/20 0351 09/14/20 0321 09/15/20 0347 09/16/20 0311  WBC 6.0 4.9  4.7 3.1* 2.8* 3.9*  NEUTROABS 3.9  --  2.0  --   --   HGB 12.2 11.6*  11.6* 11.8* 11.5* 11.6*  HCT 38.4 37.4  36.8 35.1* 35.9* 36.9  MCV 94.1 95.4  94.6 90.2 93.2 94.4  PLT 264 233  247 239 160 790   Basic Metabolic Panel: Recent Labs  Lab 09/12/20 1804 09/13/20 0351 09/14/20 0321 09/15/20 0347 09/16/20 0311  NA 131* 134* 135 138 140  K 4.2 4.0 4.0 3.8 3.6  CL 94* 93* 94* 95* 96*  CO2 27 30 32 32 36*  GLUCOSE 108* 81 136* 93 92  BUN 16 17 22 18  24*  CREATININE 0.68 0.66  0.60 0.63 0.50 0.60  CALCIUM 8.0* 7.8* 7.9* 8.1* 7.9*   GFR: Estimated Creatinine Clearance: 38.7 mL/min (by C-G formula based on SCr of 0.6 mg/dL). Liver Function Tests: Recent Labs  Lab 09/12/20 1804 09/14/20 0321  AST 37 32  ALT 25 24  ALKPHOS 145* 127*  BILITOT 0.6 0.5  PROT 5.9* 5.4*  ALBUMIN 3.1* 2.7*   Recent Labs  Lab 09/12/20 1804  LIPASE 27   No results for input(s): AMMONIA in the last 168 hours. Coagulation Profile: No results for input(s): INR, PROTIME in the last 168 hours. Cardiac Enzymes: No results for input(s): CKTOTAL, CKMB, CKMBINDEX, TROPONINI in the last 168 hours. BNP (last 3 results) No results for input(s): PROBNP in the last 8760 hours. HbA1C: No results for input(s): HGBA1C in the last 72 hours. CBG: No results for input(s): GLUCAP in the last 168 hours. Lipid Profile: No results for input(s): CHOL, HDL, LDLCALC, TRIG, CHOLHDL, LDLDIRECT in the last 72 hours. Thyroid Function Tests: No results for input(s): TSH, T4TOTAL, FREET4, T3FREE, THYROIDAB in the last 72 hours. Anemia Panel: Recent Labs    09/15/20 0347 09/16/20 0311  FERRITIN 134 103   Sepsis Labs: Recent Labs  Lab 09/13/20 1325  PROCALCITON <0.10    Recent Results  (from the past 240 hour(s))  Resp Panel by RT-PCR (Flu A&B, Covid) Nasopharyngeal Swab     Status: Abnormal   Collection Time: 09/12/20  6:04 PM   Specimen: Nasopharyngeal Swab; Nasopharyngeal(NP) swabs in vial transport medium  Result Value Ref Range Status   SARS Coronavirus 2 by RT PCR POSITIVE (A) NEGATIVE Final    Comment: RESULT CALLED TO, READ BACK BY AND VERIFIED WITH: HILARY, RN @ 2409 ON 09/12/20 C VARNER (NOTE) SARS-CoV-2 target nucleic acids are DETECTED.  The SARS-CoV-2 RNA is generally detectable in upper respiratory specimens during the acute phase of infection. Positive results are indicative of the presence of the identified virus, but do not rule out bacterial infection or co-infection with other pathogens not detected by the test. Clinical correlation with patient history and other diagnostic information is necessary to determine patient infection status. The expected result is Negative.  Fact Sheet for Patients: EntrepreneurPulse.com.au  Fact Sheet for Healthcare Providers: IncredibleEmployment.be  This test is not yet approved or cleared by the  [place] Architectural technologist and  has been authorized for detection and/or diagnosis of SARS-CoV-2 by FDA under an Print production planner (EUA).  This EUA will remain in effect (meaning this test ca n be used) for the duration of  the COVID-19 declaration under Section 564(b)(1) of the Act, 21 U.S.C. section 360bbb-3(b)(1), unless the authorization is terminated or revoked sooner.     Influenza A by PCR NEGATIVE NEGATIVE Final   Influenza B by PCR NEGATIVE NEGATIVE Final    Comment: (NOTE) The Xpert Xpress SARS-CoV-2/FLU/RSV plus assay is intended as an aid in the diagnosis of influenza from Nasopharyngeal swab specimens and should not be used as a sole basis for treatment. Nasal washings and aspirates are unacceptable for Xpert Xpress SARS-CoV-2/FLU/RSV testing.  Fact Sheet for  Patients: EntrepreneurPulse.com.au  Fact Sheet for Healthcare Providers: IncredibleEmployment.be  This test is not yet approved or cleared by the [place] FDA and has been authorized for detection and/or diagnosis of SARS-CoV-2 by FDA under an Emergency Use Authorization (EUA). This EUA will remain in effect (meaning this test can be used) for the duration of the COVID-19 declaration under Section 564(b)(1) of the Act, 21 U.S.C. section 360bbb-3(b)(1), unless the authorization is terminated or revoked.  Performed at [facility], [place] [address] [place], [place] [address]   Blood culture (routine x 2)     Status: None (Preliminary result)   Collection Time: 09/12/20  6:25 PM   Specimen: BLOOD RIGHT FOREARM  Result Value Ref Range Status   Specimen Description   Final    BLOOD RIGHT FOREARM Performed at [place] [address] [place], [place] [address]    Special Requests   Final    BOTTLES DRAWN AEROBIC ONLY Blood Culture results may not be optimal due to an inadequate volume of blood received in culture bottles Performed at [facility] [address], [place], [place] [address]    Culture   Final    NO GROWTH 4 DAYS Performed at [facility], [place] [address], [place], [place] [address]    Report Status PENDING  Incomplete  Blood culture (routine x 2)     Status: None (Preliminary result)   Collection Time: 09/12/20  6:35 PM   Specimen: BLOOD  Result Value Ref Range Status   Specimen Description   Final    BLOOD LEFT ANTECUBITAL Performed at [place] [address], [place], [place] [address]    Special Requests   Final    BOTTLES DRAWN AEROBIC AND ANAEROBIC Blood Culture adequate volume Performed at [place] [address], [place], [place] [address]    Culture   Final    NO GROWTH 4 DAYS Performed at [facility], [place] [address], [place], [place] [address]    Report Status PENDING  Incomplete  Urine culture     Status: None   Collection Time: 09/12/20  8:00 PM   Specimen: Urine, Clean Catch  Result Value Ref Range Status   Specimen Description   Final    URINE, CLEAN CATCH Performed at [facility], [place] [address], [place], [place] [address]    Special Requests   Final    NONE Performed at [facility], [place] [address] [place], [place] [address]    Culture   Final    NO GROWTH Performed at [facility], [place] [address] [place], [place] [address]  Report Status 09/14/2020 FINAL  Final  Body fluid culture w Gram Stain     Status: None (Preliminary result)   Collection Time: 09/13/20 11:17 AM   Specimen: Lung, Left; Pleural Fluid  Result Value Ref Range Status   Specimen Description   Final    PLEURAL LEFT Performed at [place] [address] [place], [place] [address]    Special Requests   Final    NONE Performed at [facility], [place] [address], [place], [place] [address]    Gram Stain   Final    RARE WBC PRESENT, PREDOMINANTLY MONONUCLEAR NO ORGANISMS SEEN    Culture   Final    NO GROWTH 2 DAYS Performed at [facility], [place] [address] [place], [place] [address]    Report Status PENDING  Incomplete  MRSA PCR Screening     Status: None   Collection Time: 09/14/20  1:22 PM  Result Value Ref Range Status   MRSA by PCR NEGATIVE NEGATIVE Final    Comment:        The GeneXpert MRSA Assay (FDA approved for NASAL specimens only), is one component of a comprehensive MRSA colonization surveillance program. It is not intended to diagnose MRSA infection nor to guide or monitor treatment for MRSA infections. Performed at [facility], [place] [address], [place], [place] [address]          Radiology Studies: [facility] Chest Port 1 View  Result Date:  09/15/2020 CLINICAL DATA:  Dyspnea. EXAM: PORTABLE CHEST 1 VIEW COMPARISON:  09/13/2020 FINDINGS: 0512 hours. Hyperexpansion suggests emphysema. Persistent left base collapse/consolidation with effusion, similar to prior. Atelectasis or scarring again noted at the right base. Bones are demineralized. The cardio pericardial silhouette is enlarged. IMPRESSION: No substantial change. Left base collapse/consolidation with effusion. Electronically Signed   By: Misty Stanley M.D.   On: 09/15/2020 07:50   ECHOCARDIOGRAM COMPLETE  Result Date: 09/14/2020    ECHOCARDIOGRAM REPORT   Patient Name:   Samantha Fitzgerald Stcharles Date of Exam: 09/14/2020 Medical Rec #:  [id number]              Height:       65.0 in Accession #:    [id number]             Weight:       130.0 lb Date of Birth:  03-May-1924               BSA:          1.647 m Patient Age:    59 years               BP:           135/68 mmHg Patient Gender: F                      HR:           65 bpm. Exam Location:  Inpatient Procedure: 2D Echo, Limited Echo, Cardiac Doppler and Color Doppler Indications:    R06.02 SOB  History:        Patient has no prior history of Echocardiogram examinations.                 Signs/Symptoms:Shortness of Breath. Covid 19 positive.  Sonographer:    Merrie Roof RDCS Referring Phys: [id number] [place]  1. Left ventricular ejection fraction, by estimation, is 65 to 70%. The left ventricle has normal  function. The left ventricle has no regional wall motion abnormalities. Left ventricular diastolic parameters are consistent with Grade I diastolic dysfunction (impaired relaxation).  2. Right ventricular systolic function is normal. The right ventricular size is not well visualized.  3. The mitral valve is grossly normal. No evidence of mitral valve regurgitation.  4. The aortic valve is grossly normal. Aortic valve regurgitation is trivial. FINDINGS  Left Ventricle: Left ventricular ejection fraction, by estimation, is 65 to  70%. The left ventricle has normal function. The left ventricle has no regional wall motion abnormalities. The left ventricular internal cavity size was small. There is no left ventricular hypertrophy. Left ventricular diastolic parameters are consistent with Grade I diastolic dysfunction (impaired relaxation). Right Ventricle: The right ventricular size is not well visualized. Right vetricular wall thickness was not well visualized. Right ventricular systolic function is normal. Left Atrium: Left atrial size was not well visualized. Right Atrium: Right atrial size was not well visualized. Pericardium: The pericardium was not well visualized. Mitral Valve: The mitral valve is grossly normal. No evidence of mitral valve regurgitation. Tricuspid Valve: The tricuspid valve is not well visualized. Tricuspid valve regurgitation is not demonstrated. Aortic Valve: The aortic valve is grossly normal. Aortic valve regurgitation is trivial. Pulmonic Valve: The pulmonic valve was not well visualized. Pulmonic valve regurgitation is not visualized. Aorta: The aortic root and ascending aorta are structurally normal, with no evidence of dilitation. IAS/Shunts: The interatrial septum was not well visualized.  LEFT VENTRICLE PLAX 2D LVIDd:         3.10 cm Diastology LVIDs:         2.10 cm LV e' medial:    4.90 cm/s LV PW:         0.80 cm LV E/e' medial:  14.1 LV IVS:        0.80 cm LV e' lateral:   4.90 cm/s                        LV E/e' lateral: 14.1  LEFT ATRIUM         Index LA diam:    3.40 cm 2.06 cm/m   AORTA Ao Root diam: 2.50 cm MITRAL VALVE MV Area (PHT): 3.60 cm MV Decel Time: 211 msec MV E velocity: 69.00 cm/s MV A velocity: 89.10 cm/s MV E/A ratio:  0.77 Mertie Moores MD Electronically signed by Mertie Moores MD Signature Date/Time: 09/14/2020/5:02:14 PM    Final         Scheduled Meds:  vitamin C  500 mg Oral Daily   atorvastatin  10 mg Oral QPM   digoxin  0.125 mg Oral Daily   enoxaparin (LOVENOX) injection   40 mg Subcutaneous Q24H   flecainide  75 mg Oral BID   furosemide  20 mg Oral Daily   Ipratropium-Albuterol  1 puff Inhalation Q6H   meclizine  25 mg Oral TID   methylPREDNISolone (SOLU-MEDROL) injection  40 mg Intravenous Daily   metoprolol tartrate  25 mg Oral BID   mometasone-formoterol  2 puff Inhalation BID   zinc sulfate  220 mg Oral Daily   Continuous Infusions:  remdesivir 100 mg in NS 100 mL 100 mg (09/16/20 0911)     LOS: 4 days   Time spent= 35 mins    Dimonique Bourdeau Arsenio Loader, MD [facility]  If 7PM-7AM, please contact night-coverage  09/16/2020, 10:34 AM

## 2020-09-16 NOTE — Progress Notes (Addendum)
Per Jeannette Corpus, NP, did not have to implement frequent VS due to yellow MEWS. WCTM pt regarding her anxiety.     09/16/20 2008  Assess: MEWS Score  Temp (!) 97.3 F (36.3 C)  BP 101/60  Pulse Rate (!) 121  SpO2 98 %  O2 Device Nasal Cannula  O2 Flow Rate (L/min) 2 L/min  Assess: MEWS Score  MEWS Temp 0  MEWS Systolic 0  MEWS Pulse 2  MEWS RR 0  MEWS LOC 0  MEWS Score 2  MEWS Score Color Yellow  Assess: if the MEWS score is Yellow or Red  Were vital signs taken at a resting state? Yes  Focused Assessment Change from prior assessment (see assessment flowsheet) (pt having anxiety)  Does the patient meet 2 or more of the SIRS criteria? No  MEWS guidelines implemented *See Row Information* No, other (Comment)  Treat  MEWS Interventions Other (Comment) (Paged Jeannette Corpus, NP, already medicated)  Pain Scale 0-10  Pain Score 0  Complains of Anxiety  Take Vital Signs  Increase Vital Sign Frequency   (None)  Escalate  MEWS: Escalate  (No, pt having anxiety)  Notify: Provider  Provider Name/Title Jeannette Corpus, NP  Date Provider Notified 09/16/20  Time Provider Notified 2010  Notification Type Page  Notification Reason Other (Comment) (anxiety meds, yellow MEWS)  Document  Patient Outcome Other (Comment) (paged NP for 2nd time)  Assess: SIRS CRITERIA  SIRS Temperature  0  SIRS Pulse 1  SIRS Respirations  0  SIRS WBC 0  SIRS Score Sum  1

## 2020-09-17 LAB — CBC
HCT: 37.2 % (ref 36.0–46.0)
Hemoglobin: 11.6 g/dL — ABNORMAL LOW (ref 12.0–15.0)
MCH: 29.5 pg (ref 26.0–34.0)
MCHC: 31.2 g/dL (ref 30.0–36.0)
MCV: 94.7 fL (ref 80.0–100.0)
Platelets: 246 K/uL (ref 150–400)
RBC: 3.93 MIL/uL (ref 3.87–5.11)
RDW: 13.2 % (ref 11.5–15.5)
WBC: 5.5 K/uL (ref 4.0–10.5)
nRBC: 0 % (ref 0.0–0.2)

## 2020-09-17 LAB — BODY FLUID CULTURE W GRAM STAIN: Culture: NO GROWTH

## 2020-09-17 LAB — BASIC METABOLIC PANEL
Anion gap: 8 (ref 5–15)
BUN: 27 mg/dL — ABNORMAL HIGH (ref 8–23)
CO2: 34 mmol/L — ABNORMAL HIGH (ref 22–32)
Calcium: 8 mg/dL — ABNORMAL LOW (ref 8.9–10.3)
Chloride: 98 mmol/L (ref 98–111)
Creatinine, Ser: 0.65 mg/dL (ref 0.44–1.00)
GFR, Estimated: >60 mL/min (ref >60)
Glucose, Bld: 98 mg/dL (ref 70–99)
Potassium: 4.1 mmol/L (ref 3.5–5.1)
Sodium: 140 mmol/L (ref 135–145)

## 2020-09-17 LAB — FERRITIN: Ferritin: 111 ng/mL (ref 11–307)

## 2020-09-17 LAB — D-DIMER, QUANTITATIVE: D-Dimer, Quant: 2.94 ug/mL-FEU — ABNORMAL HIGH (ref 0.00–0.50)

## 2020-09-17 LAB — MAGNESIUM: Magnesium: 1.9 mg/dL (ref 1.7–2.4)

## 2020-09-17 LAB — CULTURE, BLOOD (ROUTINE X 2)
Culture: NO GROWTH
Culture: NO GROWTH
Special Requests: ADEQUATE

## 2020-09-17 LAB — C-REACTIVE PROTEIN: CRP: 0.6 mg/dL (ref <1.0)

## 2020-09-17 LAB — LACTATE DEHYDROGENASE: LDH: 152 U/L (ref 98–192)

## 2020-09-17 MED ORDER — METOPROLOL TARTRATE 25 MG PO TABS
12.5000 mg | ORAL_TABLET | Freq: Two times a day (BID) | ORAL | Status: DC
  Administered 2020-09-17: 12.5 mg via ORAL
  Filled 2020-09-17: qty 1

## 2020-09-17 NOTE — TOC Progression Note (Signed)
Transition of Care [facility]) - Progression Note    Patient Details  Name: Samantha Fitzgerald MRN: [id number] Date of Birth: [birth year]  [facility] CM/SW Contact  Purcell Mouton, RN Phone Number: 09/17/2020, 12:01 PM  Clinical Narrative:    Pt will transfer to [facility] today.  PTAR was called, RN, pt and pt's daughter are aware.   Expected Discharge Plan: Skilled Nursing Facility Barriers to Discharge: No Barriers Identified  Expected Discharge Plan and Services Expected Discharge Plan: [place]   Discharge Planning Services: CM Consult     Expected Discharge Date: 09/17/20                                     Social Determinants of Health (SDOH) Interventions    Readmission Risk Interventions No flowsheet data found.

## 2020-09-17 NOTE — Discharge Summary (Signed)
Done on 09/17/20 at 1024 am, mistakenly labelled as Progress Note.

## 2020-09-17 NOTE — Progress Notes (Signed)
0645 Pt has not urinated since 7/3 around 1530. Pt stated she feels a little pressure in her bladder. RN bladder scanned pt, 361ml. WCTM.

## 2020-09-17 NOTE — Plan of Care (Signed)
Report called to [facility] Kristeen Miss LPN). Pt and daughter at bedside notified of transfer back to rehab planned for today.  Pt to be transferred by [facility]. Pt safety maintained. Will continue to monitor and reassess.  Problem: Education: Goal: Knowledge of General Education information will improve Description: Including pain rating scale, medication(s)/side effects and non-pharmacologic comfort measures Outcome: Adequate for Discharge   Problem: Health Behavior/Discharge Planning: Goal: Ability to manage health-related needs will improve Outcome: Adequate for Discharge   Problem: Clinical Measurements: Goal: Ability to maintain clinical measurements within normal limits will improve Outcome: Adequate for Discharge Goal: Will remain free from infection Outcome: Adequate for Discharge Goal: Diagnostic test results will improve Outcome: Adequate for Discharge Goal: Respiratory complications will improve Outcome: Adequate for Discharge Goal: Cardiovascular complication will be avoided Outcome: Adequate for Discharge   Problem: Activity: Goal: Risk for activity intolerance will decrease Outcome: Adequate for Discharge   Problem: Nutrition: Goal: Adequate nutrition will be maintained Outcome: Adequate for Discharge   Problem: Coping: Goal: Level of anxiety will decrease Outcome: Adequate for Discharge   Problem: Elimination: Goal: Will not experience complications related to bowel motility Outcome: Adequate for Discharge Goal: Will not experience complications related to urinary retention Outcome: Adequate for Discharge   Problem: Pain Managment: Goal: General experience of comfort will improve Outcome: Adequate for Discharge   Problem: Safety: Goal: Ability to remain free from injury will improve Outcome: Adequate for Discharge   Problem: Skin Integrity: Goal: Risk for impaired skin integrity will decrease Outcome: Adequate for Discharge

## 2020-09-17 NOTE — Discharge Summary (Addendum)
Physician Discharge Summary  Samantha Fitzgerald EOF:[id number] DOB: [birth year] DOA: 09/12/2020  PCP: System, Provider Not In  Admit date: 09/12/2020 Discharge date: 09/17/2020  Admitted From rehab Disposition: Rehab  Recommendations for Outpatient Follow-up:  Follow up with PCP in 1-2 weeks Please obtain BMP/CBC in one week your next doctors visit.  She can get meclizine as needed if necessary    Discharge Condition: Stable CODE STATUS: Full code Diet recommendation: Regular  Brief/Interim Summary: 87 year old with history of COPD 2 L nasal cannula, sick sinus syndrome, CHF admitted for shortness of breath and dry cough.  Found to have large left-sided pleural effusion, COVID-19 positive.  Underwent thoracentesis 6/30, 850 cc removed.  She was started on steroids and remdesivir.  Complete her course in the hospital, slowly she was weaned down to 2 L nasal cannula.  Repeat chest x-ray was overall stable.  PT recommended rehab therefore arrangements were made.     Assessment & Plan:   Principal Problem:   Pleural effusion on left Active Problems:   COPD (chronic obstructive pulmonary disease) (HCC)   Sick sinus syndrome (HCC)   Hypoxia   Acute on chronic hypoxic respiratory failure, improved -Multifactorial secondary to large left-sided pleural effusion and COVID-19 infection.  Now on baseline 2 L nasal cannula slowly wean this off.   Large left-sided pleural effusion-status postthoracentesis 6/30. 850 cc removed. -Pleural fluid studies-negative.  Follow-up culture- - Echocardiogram-65%, grade 1 DD -Repeat chest x-ray 7/1 stable   COVID-19 infection, improving - Now on baseline 2 L nasal cannula.  Completed 5-day course of steroids and remdesivir -Antibiotics-we will discontinue azithromycin and Rocephin -procalcitonin-negative -Chest x-ray-improved after thoracentesis -Supportive care-antitussive, inhalers, I-S/flutter -CODE STATUS confirmed - Inflammatory markers  improved   History of COPD - Bronchodilators  History of sick sinus syndrome - Rate is controlled.  Continue to monitor. On flecainide and digoxin at home. Prescribe both by Cardiology in [facility].   Dizziness - Meclizine started  Body mass index is 21.63 kg/m.  Pressure Injury 09/16/20 Buttocks Left Stage 1 -  Intact skin with non-blanchable redness of a localized area usually over a bony prominence. red (Active)  09/16/20 2208  Location: Buttocks  Location Orientation: Left  Staging: Stage 1 -  Intact skin with non-blanchable redness of a localized area usually over a bony prominence.  Wound Description (Comments): red  Present on Admission:         Discharge Diagnoses:  Principal Problem:   Pleural effusion on left Active Problems:   COPD (chronic obstructive pulmonary disease) (HCC)   Sick sinus syndrome (HCC)   Hypoxia    Subjective: No complaints feels great  Discharge Exam: Vitals:   09/16/20 2239 09/17/20 0339  BP: 108/66 (!) 150/67  Pulse: (!) 106 67  Resp: 20 19  Temp:  97.6 F (36.4 C)  SpO2: 100% 99%   Vitals:   09/16/20 1816 09/16/20 2008 09/16/20 2239 09/17/20 0339  BP: 127/76 101/60 108/66 (!) 150/67  Pulse:  (!) 121 (!) 106 67  Resp: 18  20 19   Temp:  (!) 97.3 F (36.3 C)  97.6 F (36.4 C)  TempSrc:  Oral  Oral  SpO2:  98% 100% 99%  Weight:      Height:        General: Pt is alert, awake, not in acute distress Cardiovascular: RRR, S1/S2 +, no rubs, no gallops,  Respiratory: CTA bilaterally, no wheezing, no rhonchi Abdominal: Soft, NT, ND, bowel sounds + Extremities: no edema, no cyanosis  Discharge Instructions   Allergies as of 09/17/2020       Reactions   Amoxicillin Other (See Comments)   Listed on MAR   Doxycycline Other (See Comments)   Listed on MAR   Levofloxacin Other (See Comments)   Listed on MAR   Morphine And Related Other (See Comments)   Listed on MAR        Medication List     STOP taking  these medications    LORazepam 0.5 MG tablet Commonly known as: ATIVAN       TAKE these medications    albuterol 108 (90 Base) MCG/ACT inhaler Commonly known as: VENTOLIN HFA Inhale 2 puffs into the lungs See admin instructions. Inhale 2 puffs by mouth twice daily. May inhale 2 puffs by mouth every 4 hours as needed for wheezing.   atorvastatin 10 MG tablet Commonly known as: LIPITOR Take 1 tablet by mouth every evening.   digoxin 0.125 MG tablet Commonly known as: LANOXIN Take 0.125 mg by mouth daily.   Ensure Take 237 mLs by mouth 2 (two) times daily between meals.   esomeprazole 10 MG packet Commonly known as: NEXIUM Take 10 mg by mouth every 12 (twelve) hours as needed for heartburn.   flecainide 50 MG tablet Commonly known as: TAMBOCOR Take 1.5 tablets by mouth 2 (two) times daily.   Fluticasone-Salmeterol 113-14 MCG/ACT Aepb Inhale 1 puff into the lungs every 12 (twelve) hours.   furosemide 20 MG tablet Commonly known as: LASIX Take 20 mg by mouth daily.   levalbuterol 0.63 MG/3ML nebulizer solution Commonly known as: XOPENEX Take 3 mLs by nebulization every 6 (six) hours as needed for wheezing.   metoprolol tartrate 25 MG tablet Commonly known as: LOPRESSOR Take 25 mg by mouth 2 (two) times daily.   Thera Tabs Take 1 tablet by mouth every evening.   Trelegy Ellipta 100-62.5-25 MCG/INH Aepb Generic drug: Fluticasone-Umeclidin-Vilant Take 1 puff by mouth daily.        Allergies  Allergen Reactions   Amoxicillin Other (See Comments)    Listed on MAR   Doxycycline Other (See Comments)    Listed on MAR   Levofloxacin Other (See Comments)    Listed on MAR   Morphine And Related Other (See Comments)    Listed on MAR    You were cared for by a hospitalist during your hospital stay. If you have any questions about your discharge medications or the care you received while you were in the hospital after you are discharged, you can call the unit and  asked to speak with the hospitalist on call if the hospitalist that took care of you is not available. Once you are discharged, your primary care physician will handle any further medical issues. Please note that no refills for any discharge medications will be authorized once you are discharged, as it is imperative that you return to your primary care physician (or establish a relationship with a primary care physician if you do not have one) for your aftercare needs so that they can reassess your need for medications and monitor your lab values.   Procedures/Studies: CT Head Wo Contrast  Result Date: 09/12/2020 CLINICAL DATA:  87 year old female with delirium. EXAM: CT HEAD WITHOUT CONTRAST TECHNIQUE: Contiguous axial images were obtained from the base of the skull through the vertex without intravenous contrast. COMPARISON:  None. FINDINGS: Brain: There is mild age-related atrophy and advanced chronic microvascular ischemic changes. There is no acute intracranial hemorrhage. No mass effect  or midline shift. No extra-axial fluid collection. Vascular: No hyperdense vessel or unexpected calcification. Skull: Normal. Negative for fracture or focal lesion. Sinuses/Orbits: No acute finding. Other: None IMPRESSION: 1. No acute intracranial pathology. 2. Age-related atrophy and advanced chronic microvascular ischemic changes. Electronically Signed   By: Anner Crete M.D.   On: 09/12/2020 20:30   CT CHEST WO CONTRAST  Result Date: 09/12/2020 CLINICAL DATA:  Abnormal x-ray EXAM: CT CHEST WITHOUT CONTRAST TECHNIQUE: Multidetector CT imaging of the chest was performed following the standard protocol without IV contrast. COMPARISON:  Chest x-ray earlier today FINDINGS: Cardiovascular: Heart is normal size. Aorta is normal caliber. Coronary artery and aortic calcifications. No aneurysm. Mediastinum/Nodes: No mediastinal, hilar, or axillary adenopathy. Trachea and esophagus are unremarkable. Thyroid unremarkable.  Lungs/Pleura: Large left pleural effusion and small right pleural effusion. Compressive atelectasis in the left lower lobe. Atelectasis or infiltrates in the right lower lobe. Upper Abdomen: Imaging into the upper abdomen demonstrates no acute findings. Musculoskeletal: Chest wall soft tissues are unremarkable. No acute bony abnormality. IMPRESSION: Large left pleural effusion and small right pleural effusion. Compressive atelectasis in the left lower lobe. Right basilar atelectasis or developing infiltrate. Coronary artery disease. Aortic Atherosclerosis (ICD10-I70.0). Electronically Signed   By: Rolm Baptise M.D.   On: 09/12/2020 22:11   DG Chest Port 1 View  Result Date: 09/15/2020 CLINICAL DATA:  Dyspnea. EXAM: PORTABLE CHEST 1 VIEW COMPARISON:  09/13/2020 FINDINGS: 0512 hours. Hyperexpansion suggests emphysema. Persistent left base collapse/consolidation with effusion, similar to prior. Atelectasis or scarring again noted at the right base. Bones are demineralized. The cardio pericardial silhouette is enlarged. IMPRESSION: No substantial change. Left base collapse/consolidation with effusion. Electronically Signed   By: Misty Stanley M.D.   On: 09/15/2020 07:50   DG Chest Port 1 View  Result Date: 09/13/2020 CLINICAL DATA:  Status post thoracentesis EXAM: PORTABLE CHEST 1 VIEW COMPARISON:  September 12, 2020 chest radiograph and chest CT FINDINGS: No appreciable pneumothorax. Left pleural effusion much smaller after thoracentesis. Small residual pleural effusion on the left. There is consolidation in the left lower lobe. There is postoperative change in the right perihilar region. Right lung otherwise clear. Heart is upper normal in size with pulmonary vascularity normal. No adenopathy. No bone lesions. IMPRESSION: No evident pneumothorax. Small residual left pleural effusion. Atelectasis left lower lobe. A degree of superimposed pneumonia left lower lobe cannot be excluded. Postoperative change on the right  without edema or airspace opacity. Stable cardiac silhouette. Electronically Signed   By: Lowella Grip III M.D.   On: 09/13/2020 11:57   DG Chest Portable 1 View  Result Date: 09/12/2020 CLINICAL DATA:  Hypoxia.  Cough and weakness. EXAM: PORTABLE CHEST 1 VIEW COMPARISON:  None. FINDINGS: The patient is rotated to the right on today's radiograph, reducing diagnostic sensitivity and specificity. Large left pleural effusion with a easy opacity in the left hemithorax partially attributable to the pleural effusion and likely partially attributable to the rightward rotation. Postoperative findings along the right hilum. Hazy density in the right apex potentially reflecting atelectasis or pneumonia although there is substantial superimposition of osseous shadows in this region which may be contributory. Descending thoracic aortic atherosclerotic calcification. Bony demineralization. IMPRESSION: 1. The patient is rotated to the right on today's radiograph, reducing diagnostic sensitivity and specificity. 2. Large left pleural effusion with passive atelectasis. Difficult to exclude superimposed left pneumonia. 3. Indistinct density at the right lung apex, airspace opacity versus superimposition of osseous shadows. 4.  Aortic Atherosclerosis (ICD10-I70.0). 5. Consider  chest CT for further characterization if symptoms warrant. Electronically Signed   By: Van Clines M.D.   On: 09/12/2020 20:25   ECHOCARDIOGRAM COMPLETE  Result Date: 09/14/2020    ECHOCARDIOGRAM REPORT   Patient Name:   Samantha Fitzgerald Date of Exam: 09/14/2020 Medical Rec #:  [id number]              Height:       65.0 in Accession #:    [id number]             Weight:       130.0 lb Date of Birth:  11-Oct-1924               BSA:          1.647 m Patient Age:    85 years               BP:           135/68 mmHg Patient Gender: F                      HR:           65 bpm. Exam Location:  Inpatient Procedure: 2D Echo, Limited Echo, Cardiac  Doppler and Color Doppler Indications:    R06.02 SOB  History:        Patient has no prior history of Echocardiogram examinations.                 Signs/Symptoms:Shortness of Breath. Covid 19 positive.  Sonographer:    Merrie Roof RDCS Referring Phys: [id number] [place]  1. Left ventricular ejection fraction, by estimation, is 65 to 70%. The left ventricle has normal function. The left ventricle has no regional wall motion abnormalities. Left ventricular diastolic parameters are consistent with Grade I diastolic dysfunction (impaired relaxation).  2. Right ventricular systolic function is normal. The right ventricular size is not well visualized.  3. The mitral valve is grossly normal. No evidence of mitral valve regurgitation.  4. The aortic valve is grossly normal. Aortic valve regurgitation is trivial. FINDINGS  Left Ventricle: Left ventricular ejection fraction, by estimation, is 65 to 70%. The left ventricle has normal function. The left ventricle has no regional wall motion abnormalities. The left ventricular internal cavity size was small. There is no left ventricular hypertrophy. Left ventricular diastolic parameters are consistent with Grade I diastolic dysfunction (impaired relaxation). Right Ventricle: The right ventricular size is not well visualized. Right vetricular wall thickness was not well visualized. Right ventricular systolic function is normal. Left Atrium: Left atrial size was not well visualized. Right Atrium: Right atrial size was not well visualized. Pericardium: The pericardium was not well visualized. Mitral Valve: The mitral valve is grossly normal. No evidence of mitral valve regurgitation. Tricuspid Valve: The tricuspid valve is not well visualized. Tricuspid valve regurgitation is not demonstrated. Aortic Valve: The aortic valve is grossly normal. Aortic valve regurgitation is trivial. Pulmonic Valve: The pulmonic valve was not well visualized. Pulmonic valve  regurgitation is not visualized. Aorta: The aortic root and ascending aorta are structurally normal, with no evidence of dilitation. IAS/Shunts: The interatrial septum was not well visualized.  LEFT VENTRICLE PLAX 2D LVIDd:         3.10 cm Diastology LVIDs:         2.10 cm LV e' medial:    4.90 cm/s LV PW:         0.80 cm LV E/e' medial:  14.1 LV IVS:        0.80 cm LV e' lateral:   4.90 cm/s                        LV E/e' lateral: 14.1  LEFT ATRIUM         Index LA diam:    3.40 cm 2.06 cm/m   AORTA Ao Root diam: 2.50 cm MITRAL VALVE MV Area (PHT): 3.60 cm MV Decel Time: 211 msec MV E velocity: 69.00 cm/s MV A velocity: 89.10 cm/s MV E/A ratio:  0.77 Mertie Moores MD Electronically signed by Mertie Moores MD Signature Date/Time: 09/14/2020/5:02:14 PM    Final    US THORACENTESIS ASP PLEURAL SPACE W/IMG GUIDE  Result Date: 09/13/2020 INDICATION: Patient with history of COPD, HF, dyspnea, hypoxia, and large left pleural effusion. Request is made for diagnostic and therapeutic left thoracentesis. EXAM: ULTRASOUND GUIDED DIAGNOSTIC AND THERAPEUTIC LEFT THORACENTESIS MEDICATIONS: 8 mL 1% lidocaine COMPLICATIONS: None immediate. PROCEDURE: An ultrasound guided thoracentesis was thoroughly discussed with the patient and questions answered. The benefits, risks, alternatives and complications were also discussed. The patient understands and wishes to proceed with the procedure. Written consent was obtained. Ultrasound was performed to localize and mark an adequate pocket of fluid in the left chest. The area was then prepped and draped in the normal sterile fashion. 1% Lidocaine was used for local anesthesia. Under ultrasound guidance a 6 Fr Safe-T-Centesis catheter was introduced. Thoracentesis was performed. The catheter was removed and a dressing applied. FINDINGS: A total of approximately 850 mL of hazy gold fluid was removed. Samples were sent to the laboratory as requested by the clinical team. IMPRESSION:  Successful ultrasound guided left thoracentesis yielding 850 mL of pleural fluid. Read by: Earley Abide, PA-C Electronically Signed   By: Markus Daft M.D.   On: 09/13/2020 12:17     The results of significant diagnostics from this hospitalization (including imaging, microbiology, ancillary and laboratory) are listed below for reference.     Microbiology: Recent Results (from the past 240 hour(s))  Resp Panel by RT-PCR (Flu A&B, Covid) Nasopharyngeal Swab     Status: Abnormal   Collection Time: 09/12/20  6:04 PM   Specimen: Nasopharyngeal Swab; Nasopharyngeal(NP) swabs in vial transport medium  Result Value Ref Range Status   SARS Coronavirus 2 by RT PCR POSITIVE (A) NEGATIVE Final    Comment: RESULT CALLED TO, READ BACK BY AND VERIFIED WITH: HILARY, RN @ [phone] ON 09/12/20 C VARNER (NOTE) SARS-CoV-2 target nucleic acids are DETECTED.  The SARS-CoV-2 RNA is generally detectable in upper respiratory specimens during the acute phase of infection. Positive results are indicative of the presence of the identified virus, but do not rule out bacterial infection or co-infection with other pathogens not detected by the test. Clinical correlation with patient history and other diagnostic information is necessary to determine patient infection status. The expected result is Negative.  Fact Sheet for Patients: EntrepreneurPulse.com.au  Fact Sheet for Healthcare Providers: IncredibleEmployment.be  This test is not yet approved or cleared by the [place] FDA and  has been authorized for detection and/or diagnosis of SARS-CoV-2 by FDA under an Emergency Use Authorization (EUA).  This EUA will remain in effect (meaning this test ca n be used) for the duration of  the COVID-19 declaration under Section 564(b)(1) of the Act, 21 U.S.C. section 360bbb-3(b)(1), unless the authorization is terminated or revoked sooner.     Influenza A by PCR NEGATIVE NEGATIVE  Final   Influenza B by PCR NEGATIVE NEGATIVE Final    Comment: (NOTE) The Xpert Xpress SARS-CoV-2/FLU/RSV plus assay is intended as an aid in the diagnosis of influenza from Nasopharyngeal swab specimens and should not be used as a sole basis for treatment. Nasal washings and aspirates are unacceptable for Xpert Xpress SARS-CoV-2/FLU/RSV testing.  Fact Sheet for Patients: EntrepreneurPulse.com.au  Fact Sheet for Healthcare Providers: IncredibleEmployment.be  This test is not yet approved or cleared by the [place] FDA and has been authorized for detection and/or diagnosis of SARS-CoV-2 by FDA under an Emergency Use Authorization (EUA). This EUA will remain in effect (meaning this test can be used) for the duration of the COVID-19 declaration under Section 564(b)(1) of the Act, 21 U.S.C. section 360bbb-3(b)(1), unless the authorization is terminated or revoked.  Performed at [facility], [place] [address] [place], [place] [address]   Blood culture (routine x 2)     Status: None   Collection Time: 09/12/20  6:25 PM   Specimen: BLOOD RIGHT FOREARM  Result Value Ref Range Status   Specimen Description   Final    BLOOD RIGHT FOREARM Performed at [place] [address], [place], [place] [address]    Special Requests   Final    BOTTLES DRAWN AEROBIC ONLY Blood Culture results may not be optimal due to an inadequate volume of blood received in culture bottles Performed at [facility] [address], [place], [place] [address]    Culture   Final    NO GROWTH 5 DAYS Performed at [facility], [place] [address] [place], [place] [address]    Report Status 09/17/2020 FINAL  Final  Blood culture (routine x 2)     Status: None   Collection Time: 09/12/20  6:35 PM   Specimen: BLOOD  Result Value Ref Range Status   Specimen Description   Final    BLOOD LEFT ANTECUBITAL Performed  at [facility] [address] [place], [place] [address]    Special Requests   Final    BOTTLES DRAWN AEROBIC AND ANAEROBIC Blood Culture adequate volume Performed at [facility] [address] [place], [place] [address]    Culture   Final    NO GROWTH 5 DAYS Performed at [facility], [place] [address], [place], [place] [address]    Report Status 09/17/2020 FINAL  Final  Urine culture     Status: None   Collection Time: 09/12/20  8:00 PM   Specimen: Urine, Clean Catch  Result Value Ref Range Status   Specimen Description   Final    URINE, CLEAN CATCH Performed at [facility], [place] [address] [place], [place] [address]    Special Requests   Final    NONE Performed at [facility], [place] [address] [place], [place] [address]    Culture   Final    NO GROWTH Performed at [facility], [place] [address], [place], [place] [address]    Report Status 09/14/2020 FINAL  Final  Body fluid culture w Gram Stain     Status: None   Collection Time: 09/13/20 11:17 AM   Specimen: Lung, Left; Pleural Fluid  Result Value Ref Range Status   Specimen Description   Final    PLEURAL LEFT Performed at [place] [address] [place], [place] [address]    Special Requests   Final  NONE Performed at [facility], [place] [address] [place], [place] [address]    Gram Stain   Final    RARE WBC PRESENT, PREDOMINANTLY MONONUCLEAR NO ORGANISMS SEEN    Culture   Final    NO GROWTH 3 DAYS Performed at [facility], [place] [address] [place], [place] [address]    Report Status 09/17/2020 FINAL  Final  MRSA PCR Screening     Status: None   Collection Time: 09/14/20  1:22 PM  Result Value Ref Range Status   MRSA by PCR NEGATIVE NEGATIVE Final    Comment:        The GeneXpert MRSA Assay (FDA approved for NASAL specimens only), is one component of a comprehensive  MRSA colonization surveillance program. It is not intended to diagnose MRSA infection nor to guide or monitor treatment for MRSA infections. Performed at [facility], [place] [address], [place],  [address]      Labs: BNP (last 3 results) Recent Labs    09/12/20 1804 09/15/20 0718  BNP 344.1* 867.6*   Basic Metabolic Panel: Recent Labs  Lab 09/13/20 0351 09/14/20 0321 09/15/20 0347 09/16/20 0311 09/17/20 0344  NA 134* 135 138 140 140  K 4.0 4.0 3.8 3.6 4.1  CL 93* 94* 95* 96* 98  CO2 30 32 32 36* 34*  GLUCOSE 81 136* 93 92 98  BUN 17 22 18  24* 27*  CREATININE 0.66  0.60 0.63 0.50 0.60 0.65  CALCIUM 7.8* 7.9* 8.1* 7.9* 8.0*  MG  --   --   --   --  1.9   Liver Function Tests: Recent Labs  Lab 09/12/20 1804 09/14/20 0321  AST 37 32  ALT 25 24  ALKPHOS 145* 127*  BILITOT 0.6 0.5  PROT 5.9* 5.4*  ALBUMIN 3.1* 2.7*   Recent Labs  Lab 09/12/20 1804  LIPASE 27   No results for input(s): AMMONIA in the last 168 hours. CBC: Recent Labs  Lab 09/12/20 1804 09/13/20 0351 09/14/20 0321 09/15/20 0347 09/16/20 0311 09/17/20 0344  WBC 6.0 4.9  4.7 3.1* 2.8* 3.9* 5.5  NEUTROABS 3.9  --  2.0  --   --   --   HGB 12.2 11.6*  11.6* 11.8* 11.5* 11.6* 11.6*  HCT 38.4 37.4  36.8 35.1* 35.9* 36.9 37.2  MCV 94.1 95.4  94.6 90.2 93.2 94.4 94.7  PLT 264 233  247 239 160 255 246   Cardiac Enzymes: No results for input(s): CKTOTAL, CKMB, CKMBINDEX, TROPONINI in the last 168 hours. BNP: Invalid input(s): POCBNP CBG: No results for input(s): GLUCAP in the last 168 hours. D-Dimer Recent Labs    09/16/20 0311 09/17/20 0344  DDIMER 2.74* 2.94*   Hgb A1c No results for input(s): HGBA1C in the last 72 hours. Lipid Profile No results for input(s): CHOL, HDL, LDLCALC, TRIG, CHOLHDL, LDLDIRECT in the last 72 hours. Thyroid function studies No results for input(s): TSH, T4TOTAL, T3FREE, THYROIDAB in the last 72 hours.  Invalid input(s):  FREET3 Anemia work up Recent Labs    09/16/20 0311 09/17/20 0344  FERRITIN 103 111   Urinalysis    Component Value Date/Time   COLORURINE YELLOW 09/12/2020 2200   APPEARANCEUR CLEAR 09/12/2020 2200   LABSPEC 1.008 09/12/2020 2200   PHURINE 7.0 09/12/2020 2200   GLUCOSEU NEGATIVE 09/12/2020 2200   HGBUR SMALL (A) 09/12/2020 [place] 09/12/2020 [place] 09/12/2020 [place] 09/12/2020 2200  NITRITE NEGATIVE 09/12/2020 2200   LEUKOCYTESUR SMALL (A) 09/12/2020 2200   Sepsis Labs Invalid input(s): PROCALCITONIN,  WBC,  LACTICIDVEN Microbiology Recent Results (from the past 240 hour(s))  Resp Panel by RT-PCR (Flu A&B, Covid) Nasopharyngeal Swab     Status: Abnormal   Collection Time: 09/12/20  6:04 PM   Specimen: Nasopharyngeal Swab; Nasopharyngeal(NP) swabs in vial transport medium  Result Value Ref Range Status   SARS Coronavirus 2 by RT PCR POSITIVE (A) NEGATIVE Final    Comment: RESULT CALLED TO, READ BACK BY AND VERIFIED WITH: HILARY, RN @ 0370 ON 09/12/20 C VARNER (NOTE) SARS-CoV-2 target nucleic acids are DETECTED.  The SARS-CoV-2 RNA is generally detectable in upper respiratory specimens during the acute phase of infection. Positive results are indicative of the presence of the identified virus, but do not rule out bacterial infection or co-infection with other pathogens not detected by the test. Clinical correlation with patient history and other diagnostic information is necessary to determine patient infection status. The expected result is Negative.  Fact Sheet for Patients: EntrepreneurPulse.com.au  Fact Sheet for Healthcare Providers: IncredibleEmployment.be  This test is not yet approved or cleared by the [place] FDA and  has been authorized for detection and/or diagnosis of SARS-CoV-2 by FDA under an Emergency Use Authorization (EUA).  This EUA will remain in  effect (meaning this test ca n be used) for the duration of  the COVID-19 declaration under Section 564(b)(1) of the Act, 21 U.S.C. section 360bbb-3(b)(1), unless the authorization is terminated or revoked sooner.     Influenza A by PCR NEGATIVE NEGATIVE Final   Influenza B by PCR NEGATIVE NEGATIVE Final    Comment: (NOTE) The Xpert Xpress SARS-CoV-2/FLU/RSV plus assay is intended as an aid in the diagnosis of influenza from Nasopharyngeal swab specimens and should not be used as a sole basis for treatment. Nasal washings and aspirates are unacceptable for Xpert Xpress SARS-CoV-2/FLU/RSV testing.  Fact Sheet for Patients: EntrepreneurPulse.com.au  Fact Sheet for Healthcare Providers: IncredibleEmployment.be  This test is not yet approved or cleared by the [place] FDA and has been authorized for detection and/or diagnosis of SARS-CoV-2 by FDA under an Emergency Use Authorization (EUA). This EUA will remain in effect (meaning this test can be used) for the duration of the COVID-19 declaration under Section 564(b)(1) of the Act, 21 U.S.C. section 360bbb-3(b)(1), unless the authorization is terminated or revoked.  Performed at [facility] [place] [address], [place], [place] [address]   Blood culture (routine x 2)     Status: None   Collection Time: 09/12/20  6:25 PM   Specimen: BLOOD RIGHT FOREARM  Result Value Ref Range Status   Specimen Description   Final    BLOOD RIGHT FOREARM Performed at [place] [address], [place], [place] [address]    Special Requests   Final    BOTTLES DRAWN AEROBIC ONLY Blood Culture results may not be optimal due to an inadequate volume of blood received in culture bottles Performed at [place] [address] [place], [place] [address]    Culture   Final    NO GROWTH 5 DAYS Performed at [facility], [place] [address],  [place], [place] [address]    Report Status 09/17/2020 FINAL  Final  Blood culture (routine x 2)     Status: None   Collection Time: 09/12/20  6:35 PM   Specimen: BLOOD  Result Value Ref Range Status  Specimen Description   Final    BLOOD LEFT ANTECUBITAL Performed at [place] [address], [place], [place] [address]    Special Requests   Final    BOTTLES DRAWN AEROBIC AND ANAEROBIC Blood Culture adequate volume Performed at [place] [address], [place], [place] [address]    Culture   Final    NO GROWTH 5 DAYS Performed at [facility], [place] [address] [place], [place] [address]    Report Status 09/17/2020 FINAL  Final  Urine culture     Status: None   Collection Time: 09/12/20  8:00 PM   Specimen: Urine, Clean Catch  Result Value Ref Range Status   Specimen Description   Final    URINE, CLEAN CATCH Performed at [facility], [place] [address], [place], [place] [address]    Special Requests   Final    NONE Performed at [facility], [place] [address] [place], [place] [address]    Culture   Final    NO GROWTH Performed at [facility], [place] [address] [place], [place] [address]    Report Status 09/14/2020 FINAL  Final  Body fluid culture w Gram Stain     Status: None   Collection Time: 09/13/20 11:17 AM   Specimen: Lung, Left; Pleural Fluid  Result Value Ref Range Status   Specimen Description   Final    PLEURAL LEFT Performed at [place] [address], [place], [place] [address]    Special Requests   Final    NONE Performed at [facility], [place] [address], [place], [place] [address]    Gram Stain   Final    RARE WBC PRESENT, PREDOMINANTLY MONONUCLEAR NO ORGANISMS SEEN    Culture   Final    NO GROWTH 3 DAYS Performed at [facility], [place] [address] [place], [place] [address]    Report Status 09/17/2020 FINAL  Final   MRSA PCR Screening     Status: None   Collection Time: 09/14/20  1:22 PM  Result Value Ref Range Status   MRSA by PCR NEGATIVE NEGATIVE Final    Comment:        The GeneXpert MRSA Assay (FDA approved for NASAL specimens only), is one component of a comprehensive MRSA colonization surveillance program. It is not intended to diagnose MRSA infection nor to guide or monitor treatment for MRSA infections. Performed at [facility], [place] [address], [place], [place] [address]      Time coordinating discharge:  I have spent 35 minutes face to face with the patient and on the ward discussing the patients care, assessment, plan and disposition with other care givers. >50% of the time was devoted counseling the patient about the risks and benefits of treatment/Discharge disposition and coordinating care.   SIGNED:   Damita Lack, MD  [facility] 09/17/2020, 12:06 PM   If 7PM-7AM, please contact night-coverage

## 2020-12-03 ENCOUNTER — Inpatient Hospital Stay (HOSPITAL_COMMUNITY)
Admission: EM | Admit: 2020-12-03 | Discharge: 2020-12-14 | DRG: 871 | Disposition: A | Discharge status: Skilled Nursing Facility | Payer: Medicare Other | Source: Skilled Nursing Facility | Attending: Internal Medicine | Admitting: Internal Medicine

## 2020-12-03 ENCOUNTER — Inpatient Hospital Stay (HOSPITAL_COMMUNITY): Payer: Medicare Other

## 2020-12-03 ENCOUNTER — Encounter (HOSPITAL_COMMUNITY): Payer: Self-pay

## 2020-12-03 ENCOUNTER — Emergency Department (HOSPITAL_COMMUNITY): Payer: Medicare Other

## 2020-12-03 DIAGNOSIS — R531 Weakness: Secondary | ICD-10-CM | POA: Diagnosis not present

## 2020-12-03 DIAGNOSIS — I5032 Chronic diastolic (congestive) heart failure: Secondary | ICD-10-CM | POA: Diagnosis present

## 2020-12-03 DIAGNOSIS — A419 Sepsis, unspecified organism: Secondary | ICD-10-CM | POA: Diagnosis present

## 2020-12-03 DIAGNOSIS — E8809 Other disorders of plasma-protein metabolism, not elsewhere classified: Secondary | ICD-10-CM | POA: Diagnosis present

## 2020-12-03 DIAGNOSIS — J9601 Acute respiratory failure with hypoxia: Secondary | ICD-10-CM | POA: Diagnosis not present

## 2020-12-03 DIAGNOSIS — R7881 Bacteremia: Secondary | ICD-10-CM

## 2020-12-03 DIAGNOSIS — F05 Delirium due to known physiological condition: Secondary | ICD-10-CM | POA: Diagnosis present

## 2020-12-03 DIAGNOSIS — E860 Dehydration: Secondary | ICD-10-CM | POA: Diagnosis present

## 2020-12-03 DIAGNOSIS — Z86718 Personal history of other venous thrombosis and embolism: Secondary | ICD-10-CM

## 2020-12-03 DIAGNOSIS — B964 Proteus (mirabilis) (morganii) as the cause of diseases classified elsewhere: Secondary | ICD-10-CM | POA: Diagnosis present

## 2020-12-03 DIAGNOSIS — I82411 Acute embolism and thrombosis of right femoral vein: Secondary | ICD-10-CM | POA: Diagnosis present

## 2020-12-03 DIAGNOSIS — R609 Edema, unspecified: Secondary | ICD-10-CM | POA: Diagnosis not present

## 2020-12-03 DIAGNOSIS — Z8616 Personal history of COVID-19: Secondary | ICD-10-CM | POA: Diagnosis not present

## 2020-12-03 DIAGNOSIS — L0390 Cellulitis, unspecified: Secondary | ICD-10-CM | POA: Diagnosis present

## 2020-12-03 DIAGNOSIS — R54 Age-related physical debility: Secondary | ICD-10-CM | POA: Diagnosis not present

## 2020-12-03 DIAGNOSIS — B952 Enterococcus as the cause of diseases classified elsewhere: Secondary | ICD-10-CM | POA: Diagnosis present

## 2020-12-03 DIAGNOSIS — I959 Hypotension, unspecified: Secondary | ICD-10-CM | POA: Diagnosis present

## 2020-12-03 DIAGNOSIS — N390 Urinary tract infection, site not specified: Secondary | ICD-10-CM | POA: Diagnosis present

## 2020-12-03 DIAGNOSIS — U071 COVID-19: Secondary | ICD-10-CM

## 2020-12-03 DIAGNOSIS — I5033 Acute on chronic diastolic (congestive) heart failure: Secondary | ICD-10-CM | POA: Diagnosis present

## 2020-12-03 DIAGNOSIS — Z20822 Contact with and (suspected) exposure to covid-19: Secondary | ICD-10-CM | POA: Diagnosis present

## 2020-12-03 DIAGNOSIS — R0600 Dyspnea, unspecified: Secondary | ICD-10-CM

## 2020-12-03 DIAGNOSIS — Z8249 Family history of ischemic heart disease and other diseases of the circulatory system: Secondary | ICD-10-CM

## 2020-12-03 DIAGNOSIS — I82811 Embolism and thrombosis of superficial veins of right lower extremities: Secondary | ICD-10-CM | POA: Diagnosis present

## 2020-12-03 DIAGNOSIS — Z9889 Other specified postprocedural states: Secondary | ICD-10-CM

## 2020-12-03 DIAGNOSIS — Z87891 Personal history of nicotine dependence: Secondary | ICD-10-CM

## 2020-12-03 DIAGNOSIS — L03116 Cellulitis of left lower limb: Secondary | ICD-10-CM | POA: Diagnosis present

## 2020-12-03 DIAGNOSIS — I495 Sick sinus syndrome: Secondary | ICD-10-CM | POA: Diagnosis present

## 2020-12-03 DIAGNOSIS — G9341 Metabolic encephalopathy: Secondary | ICD-10-CM | POA: Diagnosis not present

## 2020-12-03 DIAGNOSIS — Z515 Encounter for palliative care: Secondary | ICD-10-CM | POA: Diagnosis not present

## 2020-12-03 DIAGNOSIS — J9621 Acute and chronic respiratory failure with hypoxia: Secondary | ICD-10-CM | POA: Diagnosis present

## 2020-12-03 DIAGNOSIS — Z880 Allergy status to penicillin: Secondary | ICD-10-CM

## 2020-12-03 DIAGNOSIS — B957 Other staphylococcus as the cause of diseases classified elsewhere: Secondary | ICD-10-CM | POA: Diagnosis present

## 2020-12-03 DIAGNOSIS — Z79899 Other long term (current) drug therapy: Secondary | ICD-10-CM

## 2020-12-03 DIAGNOSIS — R6520 Severe sepsis without septic shock: Secondary | ICD-10-CM

## 2020-12-03 DIAGNOSIS — Z881 Allergy status to other antibiotic agents status: Secondary | ICD-10-CM

## 2020-12-03 DIAGNOSIS — Z9981 Dependence on supplemental oxygen: Secondary | ICD-10-CM

## 2020-12-03 DIAGNOSIS — Z66 Do not resuscitate: Secondary | ICD-10-CM | POA: Diagnosis present

## 2020-12-03 DIAGNOSIS — Z1624 Resistance to multiple antibiotics: Secondary | ICD-10-CM | POA: Diagnosis present

## 2020-12-03 DIAGNOSIS — E876 Hypokalemia: Secondary | ICD-10-CM | POA: Diagnosis not present

## 2020-12-03 DIAGNOSIS — E785 Hyperlipidemia, unspecified: Secondary | ICD-10-CM | POA: Diagnosis present

## 2020-12-03 DIAGNOSIS — Z9181 History of falling: Secondary | ICD-10-CM

## 2020-12-03 DIAGNOSIS — J449 Chronic obstructive pulmonary disease, unspecified: Secondary | ICD-10-CM | POA: Diagnosis present

## 2020-12-03 DIAGNOSIS — R630 Anorexia: Secondary | ICD-10-CM | POA: Diagnosis not present

## 2020-12-03 DIAGNOSIS — A4159 Other Gram-negative sepsis: Principal | ICD-10-CM | POA: Diagnosis present

## 2020-12-03 DIAGNOSIS — Z885 Allergy status to narcotic agent status: Secondary | ICD-10-CM

## 2020-12-03 DIAGNOSIS — N179 Acute kidney failure, unspecified: Secondary | ICD-10-CM | POA: Diagnosis not present

## 2020-12-03 DIAGNOSIS — J90 Pleural effusion, not elsewhere classified: Secondary | ICD-10-CM | POA: Diagnosis not present

## 2020-12-03 DIAGNOSIS — R339 Retention of urine, unspecified: Secondary | ICD-10-CM | POA: Diagnosis not present

## 2020-12-03 DIAGNOSIS — Z7189 Other specified counseling: Secondary | ICD-10-CM

## 2020-12-03 DIAGNOSIS — I5031 Acute diastolic (congestive) heart failure: Secondary | ICD-10-CM | POA: Diagnosis not present

## 2020-12-03 DIAGNOSIS — I471 Supraventricular tachycardia: Secondary | ICD-10-CM | POA: Diagnosis not present

## 2020-12-03 DIAGNOSIS — Z7951 Long term (current) use of inhaled steroids: Secondary | ICD-10-CM

## 2020-12-03 LAB — URINALYSIS, MICROSCOPIC (REFLEX)
Squamous Epithelial / HPF: NONE SEEN (ref 0–5)
WBC, UA: >50 WBC/hpf (ref 0–5)

## 2020-12-03 LAB — URINALYSIS, ROUTINE W REFLEX MICROSCOPIC

## 2020-12-03 LAB — MAGNESIUM: Magnesium: 1.8 mg/dL (ref 1.7–2.4)

## 2020-12-03 LAB — PROCALCITONIN: Procalcitonin: 0.61 ng/mL

## 2020-12-03 LAB — CBC WITH DIFFERENTIAL/PLATELET
Abs Immature Granulocytes: 0.31 10*3/uL — ABNORMAL HIGH (ref 0.00–0.07)
Basophils Absolute: 0.1 10*3/uL (ref 0.0–0.1)
Basophils Relative: 0 %
Eosinophils Absolute: 0.1 10*3/uL (ref 0.0–0.5)
Eosinophils Relative: 0 %
HCT: 37.7 % (ref 36.0–46.0)
Hemoglobin: 12 g/dL (ref 12.0–15.0)
Immature Granulocytes: 1 %
Lymphocytes Relative: 4 %
Lymphs Abs: 0.8 10*3/uL (ref 0.7–4.0)
MCH: 28.9 pg (ref 26.0–34.0)
MCHC: 31.8 g/dL (ref 30.0–36.0)
MCV: 90.8 fL (ref 80.0–100.0)
Monocytes Absolute: 0.7 10*3/uL (ref 0.1–1.0)
Monocytes Relative: 3 %
Neutro Abs: 22.2 10*3/uL — ABNORMAL HIGH (ref 1.7–7.7)
Neutrophils Relative %: 92 %
Platelets: 355 10*3/uL (ref 150–400)
RBC: 4.15 MIL/uL (ref 3.87–5.11)
RDW: 14 % (ref 11.5–15.5)
WBC: 24.2 10*3/uL — ABNORMAL HIGH (ref 4.0–10.5)
nRBC: 0 % (ref 0.0–0.2)

## 2020-12-03 LAB — RESP PANEL BY RT-PCR (FLU A&B, COVID) ARPGX2
Influenza A by PCR: NEGATIVE
Influenza B by PCR: NEGATIVE
SARS Coronavirus 2 by RT PCR: POSITIVE — AB

## 2020-12-03 LAB — COMPREHENSIVE METABOLIC PANEL
ALT: 14 U/L (ref 0–44)
AST: 20 U/L (ref 15–41)
Albumin: 2.9 g/dL — ABNORMAL LOW (ref 3.5–5.0)
Alkaline Phosphatase: 94 U/L (ref 38–126)
Anion gap: 12 (ref 5–15)
BUN: 17 mg/dL (ref 8–23)
CO2: 30 mmol/L (ref 22–32)
Calcium: 8.6 mg/dL — ABNORMAL LOW (ref 8.9–10.3)
Chloride: 93 mmol/L — ABNORMAL LOW (ref 98–111)
Creatinine, Ser: 1 mg/dL (ref 0.44–1.00)
GFR, Estimated: 52 mL/min — ABNORMAL LOW (ref >60)
Glucose, Bld: 134 mg/dL — ABNORMAL HIGH (ref 70–99)
Potassium: 4 mmol/L (ref 3.5–5.1)
Sodium: 135 mmol/L (ref 135–145)
Total Bilirubin: 0.7 mg/dL (ref 0.3–1.2)
Total Protein: 6.3 g/dL — ABNORMAL LOW (ref 6.5–8.1)

## 2020-12-03 LAB — LACTIC ACID, PLASMA
Lactic Acid, Venous: 2.2 mmol/L (ref 0.5–1.9)
Lactic Acid, Venous: 2 mmol/L (ref 0.5–1.9)

## 2020-12-03 LAB — PHOSPHORUS: Phosphorus: 3.8 mg/dL (ref 2.5–4.6)

## 2020-12-03 LAB — CK: Total CK: 31 U/L — ABNORMAL LOW (ref 38–234)

## 2020-12-03 LAB — DIGOXIN LEVEL: Digoxin Level: 1.4 ng/mL (ref 0.8–2.0)

## 2020-12-03 LAB — BRAIN NATRIURETIC PEPTIDE: B Natriuretic Peptide: 1334.5 pg/mL — ABNORMAL HIGH (ref 0.0–100.0)

## 2020-12-03 LAB — TROPONIN I (HIGH SENSITIVITY): Troponin I (High Sensitivity): 96 ng/L — ABNORMAL HIGH (ref <18)

## 2020-12-03 LAB — LACTATE DEHYDROGENASE: LDH: 128 U/L (ref 98–192)

## 2020-12-03 LAB — PROTIME-INR
INR: 1 (ref 0.8–1.2)
Prothrombin Time: 13.5 seconds (ref 11.4–15.2)

## 2020-12-03 MED ORDER — ACETAMINOPHEN 325 MG PO TABS
650.0000 mg | ORAL_TABLET | Freq: Four times a day (QID) | ORAL | Status: DC | PRN
  Filled 2020-12-03 (×2): qty 2

## 2020-12-03 MED ORDER — FUROSEMIDE 10 MG/ML IJ SOLN: 20.0000 mg | Freq: Once | INTRAMUSCULAR | Status: DC

## 2020-12-03 MED ORDER — LACTATED RINGERS IV BOLUS
500.0000 mL | Freq: Once | INTRAVENOUS | Status: AC
  Administered 2020-12-03: 500 mL via INTRAVENOUS

## 2020-12-03 MED ORDER — ALBUTEROL SULFATE HFA 108 (90 BASE) MCG/ACT IN AERS
2.0000 | INHALATION_SPRAY | RESPIRATORY_TRACT | Status: DC | PRN
  Administered 2020-12-04 – 2020-12-08 (×4): 2 via RESPIRATORY_TRACT
  Filled 2020-12-03: qty 6.7

## 2020-12-03 MED ORDER — VANCOMYCIN HCL IN DEXTROSE 1-5 GM/200ML-% IV SOLN
1000.0000 mg | Freq: Once | INTRAVENOUS | Status: AC
  Administered 2020-12-03: 1000 mg via INTRAVENOUS
  Filled 2020-12-03: qty 200

## 2020-12-03 MED ORDER — SODIUM CHLORIDE 0.9% FLUSH
3.0000 mL | Freq: Two times a day (BID) | INTRAVENOUS | Status: DC
  Administered 2020-12-03 – 2020-12-13 (×20): 3 mL via INTRAVENOUS

## 2020-12-03 MED ORDER — SODIUM CHLORIDE 0.9% FLUSH
3.0000 mL | INTRAVENOUS | Status: DC | PRN
  Administered 2020-12-03: 3 mL via INTRAVENOUS

## 2020-12-03 MED ORDER — DIGOXIN 125 MCG PO TABS
0.1250 mg | ORAL_TABLET | Freq: Every day | ORAL | Status: DC
  Administered 2020-12-03 – 2020-12-14 (×12): 0.125 mg via ORAL
  Filled 2020-12-03 (×12): qty 1

## 2020-12-03 MED ORDER — FLUTICASONE-UMECLIDIN-VILANT 100-62.5-25 MCG/INH IN AEPB: 1.0000 | INHALATION_SPRAY | Freq: Every day | RESPIRATORY_TRACT | Status: DC

## 2020-12-03 MED ORDER — ENSURE ENLIVE PO LIQD
237.0000 mL | Freq: Two times a day (BID) | ORAL | Status: DC
  Administered 2020-12-05 – 2020-12-13 (×17): 237 mL via ORAL
  Filled 2020-12-03 (×3): qty 237

## 2020-12-03 MED ORDER — SODIUM CHLORIDE 0.9 % IV SOLN: 250.0000 mL | INTRAVENOUS | Status: DC | PRN

## 2020-12-03 MED ORDER — ACETAMINOPHEN 650 MG RE SUPP: 650.0000 mg | Freq: Four times a day (QID) | RECTAL | Status: DC | PRN

## 2020-12-03 MED ORDER — SODIUM CHLORIDE 0.9 % IV SOLN
2.0000 g | INTRAVENOUS | Status: DC
  Administered 2020-12-04 – 2020-12-05 (×2): 2 g via INTRAVENOUS
  Filled 2020-12-03 (×2): qty 20

## 2020-12-03 MED ORDER — ALBUMIN HUMAN 5 % IV SOLN
12.5000 g | Freq: Once | INTRAVENOUS | Status: AC
  Administered 2020-12-03: 12.5 g via INTRAVENOUS
  Filled 2020-12-03: qty 250

## 2020-12-03 MED ORDER — FLECAINIDE ACETATE 50 MG PO TABS
75.0000 mg | ORAL_TABLET | Freq: Two times a day (BID) | ORAL | Status: DC
  Administered 2020-12-03 – 2020-12-14 (×22): 75 mg via ORAL
  Filled 2020-12-03 (×22): qty 2

## 2020-12-03 MED ORDER — LACTATED RINGERS IV SOLN: INTRAVENOUS | Status: DC

## 2020-12-03 MED ORDER — ALBUTEROL SULFATE HFA 108 (90 BASE) MCG/ACT IN AERS
2.0000 | INHALATION_SPRAY | Freq: Two times a day (BID) | RESPIRATORY_TRACT | Status: DC
  Administered 2020-12-03 – 2020-12-13 (×19): 2 via RESPIRATORY_TRACT
  Filled 2020-12-03 (×2): qty 6.7

## 2020-12-03 MED ORDER — SODIUM CHLORIDE 0.9 % IV SOLN
2.0000 g | Freq: Once | INTRAVENOUS | Status: AC
  Administered 2020-12-03: 2 g via INTRAVENOUS
  Filled 2020-12-03: qty 2

## 2020-12-03 MED ORDER — METRONIDAZOLE 500 MG/100ML IV SOLN
500.0000 mg | Freq: Once | INTRAVENOUS | Status: AC
Start: 2020-12-03 — End: 2020-12-03
  Administered 2020-12-03: 500 mg via INTRAVENOUS
  Filled 2020-12-03: qty 100

## 2020-12-03 MED ORDER — LACTATED RINGERS IV BOLUS
1000.0000 mL | Freq: Once | INTRAVENOUS | Status: AC
  Administered 2020-12-03: 1000 mL via INTRAVENOUS

## 2020-12-03 MED ORDER — ALBUTEROL SULFATE HFA 108 (90 BASE) MCG/ACT IN AERS: 2.0000 | INHALATION_SPRAY | RESPIRATORY_TRACT | Status: DC

## 2020-12-03 NOTE — ED Provider Notes (Signed)
  Face-to-face evaluation   History: Patient presents for evaluation of low blood pressure.  She is unable to give complete history.  She is not sure if she has had trouble eating or drinking.  Currently she denies cough, pain, weakness.  Level 5 caveat-poor historian  Physical exam: Alert female, cooperative.  Oral mucous membranes are dry.  She is cooperative and able to assist with evaluation but sitting up.  Lungs are clear.  Abdomen soft nontender to palpation.  Lower extremities with moderate edema, redness anteriorly with some areas of bruising.  Appearance is nonspecific but could indicate cellulitis.  Medical screening examination/treatment/procedure(s) were conducted as a shared visit with non-physician practitioner(s) and myself.  I personally evaluated the patient during the encounter    Daleen Bo, MD 12/03/20 1725

## 2020-12-03 NOTE — Progress Notes (Signed)
A consult was received from an ED physician for vanc/cefepime per pharmacy dosing.  The patient's profile has been reviewed for ht/wt/allergies/indication/available labs.   A one time order has been placed for vanc 1g and cefepime 2g.  Further antibiotics/pharmacy consults should be ordered by admitting physician if indicated.                       Thank you, Kara Mead 12/03/2020  11:53 AM

## 2020-12-03 NOTE — Sepsis Progress Note (Signed)
Sepsis protocol is being followed by [facility]. 

## 2020-12-03 NOTE — H&P (Addendum)
Samantha Fitzgerald BRA:[id number] DOB: 07-23-1924 DOA: 12/03/2020     PCP: System, Provider Not In      Patient arrived to ER on 12/03/20 at 90 Referred by Attending Toy Baker, MD   Patient coming from:   From facility  [facility]  Chief Complaint:   Chief Complaint  Patient presents with   Fatigue    HPI: Samantha Fitzgerald is a 87 y.o. female with medical history significant of   COPD 2 L nasal cannula, sick sinus syndrome, CHF    large left-sided pleural effusion, Covid positive September 12 2020  Presented with   generalized weakness started today. Hypotensive 80/60s  but pitting edema bilateraly Patient herself unable to provide history.  Oral membranes appear to be dry  Hx of Pleural Effusion: Underwent thoracentesis 6/30, 850 cc removed Pleural fluid studies-negative. Re COVID was treated with steroids and remdesivir.  Complete her course in the hospital, slowly she was weaned down to 2 L nasal cannula    She had a recent flu shot 4 days Ago  Has   been vaccinated against COVID     Initial COVID TEST   POSITIVE,     Lab Results  Component Value Date   [place] (A) 12/03/2020   [place] (A) 09/12/2020     Regarding pertinent Chronic problems:     Hyperlipidemia - on statins, Lipitor Lipid Panel  No results found for: CHOL, TRIG, HDL, CHOLHDL, VLDL, LDLCALC, LDLDIRECT, LABVLDL   HTN on Metoprolol   chronic CHF diastolic - last echoJuly 8811 Echocardiogram-65%, grade 1 DD     COPD - not on baseline oxygen  2L,     Sick sinus syndrome on flecainide and digoxin at home.     While in ER: Hypotensive initially responded well to IV fluids UA was concerning for UTI chest x-ray concerning for left pleural effusion which is persistent. A diagnosis of sepsis and started on broad-spectrum antibiotics including Vanco cefepime Flagyl Given 2 L of LR Blood cultures obtained initial lactic acid 2.2 after IV fluids down to  2.0 Initially white blood cell count elevated after 24 Urine culture pending Tested positive for COVID CT count 42 BNP elevated into 1334 up from baseline Albumin down to 2.9  ED Triage Vitals [12/03/20 1041]  Enc Vitals Group     BP (!) 91/55     Pulse Rate (!) 119     Resp 18     Temp 97.7 F (36.5 C)     Temp Source Oral     SpO2 94 %     Weight      Height      Head Circumference      Peak Flow      Pain Score      Pain Loc      Pain Edu?      Excl. in [place]?   SRPR(94)@     _________________________________________ Significant initial  Findings: Abnormal Labs Reviewed  RESP PANEL BY RT-PCR (FLU A&B, COVID) ARPGX2 - Abnormal; Notable for the following components:      Result Value   SARS Coronavirus 2 by RT PCR POSITIVE (*)    All other components within normal limits  COMPREHENSIVE METABOLIC PANEL - Abnormal; Notable for the following components:   Chloride 93 (*)    Glucose, Bld 134 (*)    Calcium 8.6 (*)    Total Protein 6.3 (*)    Albumin 2.9 (*)    GFR, Estimated  52 (*)    All other components within normal limits  LACTIC ACID, PLASMA - Abnormal; Notable for the following components:   Lactic Acid, Venous 2.2 (*)    All other components within normal limits  LACTIC ACID, PLASMA - Abnormal; Notable for the following components:   Lactic Acid, Venous 2.0 (*)    All other components within normal limits  CBC WITH DIFFERENTIAL/PLATELET - Abnormal; Notable for the following components:   WBC 24.2 (*)    Neutro Abs 22.2 (*)    Abs Immature Granulocytes 0.31 (*)    All other components within normal limits  URINALYSIS, ROUTINE W REFLEX MICROSCOPIC - Abnormal; Notable for the following components:   Color, Urine STRAW (*)    APPearance TURBID (*)    Glucose, UA   (*)    Value: TEST NOT REPORTED DUE TO COLOR INTERFERENCE OF URINE PIGMENT   Hgb urine dipstick   (*)    Value: TEST NOT REPORTED DUE TO COLOR INTERFERENCE OF URINE PIGMENT   Bilirubin Urine   (*)     Value: TEST NOT REPORTED DUE TO COLOR INTERFERENCE OF URINE PIGMENT   Ketones, ur   (*)    Value: TEST NOT REPORTED DUE TO COLOR INTERFERENCE OF URINE PIGMENT   Protein, ur   (*)    Value: TEST NOT REPORTED DUE TO COLOR INTERFERENCE OF URINE PIGMENT   Nitrite   (*)    Value: TEST NOT REPORTED DUE TO COLOR INTERFERENCE OF URINE PIGMENT   Leukocytes,Ua   (*)    Value: TEST NOT REPORTED DUE TO COLOR INTERFERENCE OF URINE PIGMENT   All other components within normal limits  BRAIN NATRIURETIC PEPTIDE - Abnormal; Notable for the following components:   B Natriuretic Peptide 1,334.5 (*)    All other components within normal limits  URINALYSIS, MICROSCOPIC (REFLEX) - Abnormal; Notable for the following components:   Bacteria, UA MANY (*)    All other components within normal limits   ____________________________________________ Ordered    CXR - Since 09/15/2020, there has been interval increase in size of the small to moderate size left pleural effusion with worsening aeration at the left base.   _________________________ Troponin  ordered ECG: Ordered Personally reviewed by me showing: HR : 118 Rhythm:junctional tachycardia  nonspecific changes,  QTC 445 ____________________ This patient meets SIRS Criteria and may be septic.   The recent clinical data is shown below. Vitals:   12/03/20 1400 12/03/20 1500 12/03/20 1600 12/03/20 1700  BP: (!) 113/59 126/68 128/83 124/66  Pulse: (!) 101 (!) 108 (!) 110 (!) 103  Resp: (!) 21 (!) 23 (!) 26 (!) 31  Temp:      TempSrc:      SpO2: 96% 100% 100% 100%    WBC     Component Value Date/Time   WBC 24.2 (H) 12/03/2020 1058   LYMPHSABS 0.8 12/03/2020 1058   MONOABS 0.7 12/03/2020 1058   EOSABS 0.1 12/03/2020 1058   BASOSABS 0.1 12/03/2020 1058    Lactic Acid, Venous    Component Value Date/Time   LATICACIDVEN 2.0 (HH) 12/03/2020 1327     Procalcitonin   Ordered   UA   evidence of UTI    Urine analysis:    Component Value  Date/Time   COLORURINE STRAW (A) 12/03/2020 1458   APPEARANCEUR TURBID (A) 12/03/2020 1458   LABSPEC  12/03/2020 1458    TEST NOT REPORTED DUE TO COLOR INTERFERENCE OF URINE PIGMENT   PHURINE  12/03/2020 1458  TEST NOT REPORTED DUE TO COLOR INTERFERENCE OF URINE PIGMENT   GLUCOSEU (A) 12/03/2020 1458    TEST NOT REPORTED DUE TO COLOR INTERFERENCE OF URINE PIGMENT   HGBUR (A) 12/03/2020 1458    TEST NOT REPORTED DUE TO COLOR INTERFERENCE OF URINE PIGMENT   BILIRUBINUR (A) 12/03/2020 1458    TEST NOT REPORTED DUE TO COLOR INTERFERENCE OF URINE PIGMENT   KETONESUR (A) 12/03/2020 1458    TEST NOT REPORTED DUE TO COLOR INTERFERENCE OF URINE PIGMENT   PROTEINUR (A) 12/03/2020 1458    TEST NOT REPORTED DUE TO COLOR INTERFERENCE OF URINE PIGMENT   NITRITE (A) 12/03/2020 1458    TEST NOT REPORTED DUE TO COLOR INTERFERENCE OF URINE PIGMENT   LEUKOCYTESUR (A) 12/03/2020 1458    TEST NOT REPORTED DUE TO COLOR INTERFERENCE OF URINE PIGMENT    Results for orders placed or performed during the hospital encounter of 12/03/20  Resp Panel by RT-PCR (Flu A&B, Covid) Nasopharyngeal Swab     Status: Abnormal   Collection Time: 12/03/20 12:03 PM   Specimen: Nasopharyngeal Swab; Nasopharyngeal(NP) swabs in vial transport medium  Result Value Ref Range Status   SARS Coronavirus 2 by RT PCR POSITIVE (A) NEGATIVE Final         Influenza A by PCR NEGATIVE NEGATIVE Final   Influenza B by PCR NEGATIVE NEGATIVE Final          _______________________________________________ Hospitalist was called for admission for sepsis due to UTI and recurrent pleural effusion  The following Work up has been ordered so far:  Orders Placed This Encounter  Procedures   Critical Care   Culture, blood (Routine x 2)   Resp Panel by RT-PCR (Flu A&B, Covid) Nasopharyngeal Swab   Urine Culture   DG Chest 2 View   Comprehensive metabolic panel   Lactic acid, plasma   CBC with Differential   Protime-INR   Urinalysis,  Routine w reflex microscopic   Brain natriuretic peptide   Urinalysis, Microscopic (reflex)   Diet NPO time specified   Notify physician (specify)   Document height and weight   Cardiac monitoring   Document height and weight   Assess and Document Glasgow Coma Scale   Document vital signs within 1-hour of fluid bolus completion.  Notify provider of abnormal vital signs despite fluid resuscitation.   Refer to Sidebar Report: Sepsis Bundle ED/IP   Notify provider for difficulties obtaining IV access   Initiate Carrier Fluid Protocol   DO NOT delay antibiotics if unable to obtain blood culture.   Check Rectal Temperature   Cardiac monitoring   Code Sepsis activation.  This occurs automatically when order is signed and prioritizes pharmacy, lab, and radiology services for STAT collections and interventions.  If [facility] downtime, call [facility] [phone]) to activate Code Sepsis.   Consult to hospitalist   Pulse oximetry, continuous   ED EKG 12-Lead   Insert peripheral IV X 1   Admit to Inpatient (patient's expected length of stay will be greater than 2 midnights or inpatient only procedure)     Following Medications were ordered in ER: Medications  lactated ringers infusion ( Intravenous New Bag/Given 12/03/20 1324)  lactated ringers bolus 1,000 mL (0 mLs Intravenous Stopped 12/03/20 1316)  ceFEPIme (MAXIPIME) 2 g in sodium chloride 0.9 % 100 mL IVPB (0 g Intravenous Stopped 12/03/20 1315)  metroNIDAZOLE (FLAGYL) IVPB 500 mg (0 mg Intravenous Stopped 12/03/20 1316)  vancomycin (VANCOCIN) IVPB 1000 mg/200 mL premix (0 mg Intravenous Stopped 12/03/20 1426)  lactated ringers bolus 500 mL (0 mLs Intravenous Stopped 12/03/20 1604)  lactated ringers bolus 500 mL (0 mLs Intravenous Stopped 12/03/20 1604)        Consult Orders  (From admission, onward)           Start     Ordered   12/03/20 1504  Consult to hospitalist  Once       Provider:  (Not yet assigned)  Question Answer Comment   Place call to: [facility]   Reason for Consult Admit      12/03/20 1503              OTHER Significant initial  Findings:  labs showing:    Recent Labs  Lab 12/03/20 1058  NA 135  K 4.0  CO2 30  GLUCOSE 134*  BUN 17  CREATININE 1.00  CALCIUM 8.6*    Cr    Up from baseline see below Lab Results  Component Value Date   CREATININE 1.00 12/03/2020   CREATININE 0.65 09/17/2020   CREATININE 0.60 09/16/2020    Recent Labs  Lab 12/03/20 1058  AST 20  ALT 14  ALKPHOS 94  BILITOT 0.7  PROT 6.3*  ALBUMIN 2.9*   Lab Results  Component Value Date   CALCIUM 8.6 (L) 12/03/2020          Plt: Lab Results  Component Value Date   PLT 355 12/03/2020    COVID-19 Labs  No results for input(s): DDIMER, FERRITIN, LDH, CRP in the last 72 hours.  Lab Results  Component Value Date   SARSCOV2NAA POSITIVE (A) 12/03/2020   SARSCOV2NAA POSITIVE (A) 09/12/2020         Recent Labs  Lab 12/03/20 1058  WBC 24.2*  NEUTROABS 22.2*  HGB 12.0  HCT 37.7  MCV 90.8  PLT 355    HG/HCT   stable,       Component Value Date/Time   HGB 12.0 12/03/2020 1058   HCT 37.7 12/03/2020 1058   MCV 90.8 12/03/2020 1058        BNP (last 3 results) Recent Labs    09/12/20 1804 09/15/20 0718 12/03/20 1055  BNP 344.1* 238.6* 1,334.5*        Cultures:    Component Value Date/Time   SDES  09/13/2020 1117    PLEURAL LEFT Performed at [facility], [place] [address] [place], [place] [address]    SPECREQUEST  09/13/2020 1117    NONE Performed at [facility], [place] [address] [place], [place] [address]    CULT  09/13/2020 1117    NO GROWTH 3 DAYS Performed at [facility] [address], [place], [place] [address]    REPTSTATUS 09/17/2020 FINAL 09/13/2020 1117     Radiological Exams on Admission: DG Chest 2 View  Result Date: 12/03/2020 CLINICAL DATA:  Weakness, hypotensive, pitting in legs EXAM: CHEST - 2 VIEW  COMPARISON:  Chest radiograph 09/15/2020 FINDINGS: The heart is mildly enlarged, unchanged. The mediastinal contours are stable. There is a small to moderate size left pleural effusion, increased in size since 09/16/2020. There is adjacent airspace disease. The left upper lobe is well aerated. There is no focal consolidation on the right. There is no significant right pleural effusion. There is no pneumothorax. There is no acute osseous abnormality. IMPRESSION: Since 09/15/2020, there has been interval increase in size of the small to moderate size left pleural effusion with worsening aeration at the left base. Electronically Signed  By: Valetta Mole M.D.   On: 12/03/2020 12:04   _______________________________________________________________________________________________________ Latest  Blood pressure 124/66, pulse (!) 103, temperature 98.3 F (36.8 C), temperature source Rectal, resp. rate (!) 31, SpO2 100 %.   Review of Systems:    Pertinent positives include:  fatigue dyspnea on exertion,  loss of appetite, Bilateral lower extremity swelling  Constitutional:  No weight loss, night sweats, Fevers, chills, weight loss  HEENT:  No headaches, Difficulty swallowing,Tooth/dental problems,Sore throat,  No sneezing, itching, ear ache, nasal congestion, post nasal drip,  Cardio-vascular:  No chest pain, Orthopnea, PND, anasarca, dizziness, palpitations.no  GI:  No heartburn, indigestion, abdominal pain, nausea, vomiting, diarrhea, change in bowel habits, melena, blood in stool, hematemesis Resp:  no shortness of breath at rest. No No excess mucus, no productive cough, No non-productive cough, No coughing up of blood.No change in color of mucus.No wheezing. Skin:  no rash or lesions. No jaundice GU:  no dysuria, change in color of urine, no urgency or frequency. No straining to urinate.  No flank pain.  Musculoskeletal:  No joint pain or no joint swelling. No decreased range of motion. No back  pain.  Psych:  No change in mood or affect. No depression or anxiety. No memory loss.  Neuro: no localizing neurological complaints, no tingling, no weakness, no double vision, no gait abnormality, no slurred speech, no confusion  All systems reviewed and apart from [place] all are negative _______________________________________________________________________________________________ Past Medical History:  History reviewed. No pertinent past medical history.    History reviewed. No pertinent surgical history.  Social History:  Ambulatory walker       reports that she quit smoking about 57 years ago. Her smoking use included cigarettes. She has a 25.00 pack-year smoking history. She has never been exposed to tobacco smoke. She has never used smokeless tobacco. She reports that she does not drink alcohol and does not use drugs.     Family History:   Family History  Problem Relation Age of Onset   Hypertension Other    ______________________________________________________________________________________________ Allergies: Allergies  Allergen Reactions   Amoxicillin Other (See Comments)    Listed on MAR   Doxycycline Other (See Comments)    Listed on MAR   Levofloxacin Other (See Comments)    Listed on MAR   Morphine And Related Other (See Comments)    Listed on MAR     Prior to Admission medications   Medication Sig Start Date End Date Taking? Authorizing Provider  albuterol (VENTOLIN HFA) 108 (90 Base) MCG/ACT inhaler Inhale 2 puffs into the lungs See admin instructions. Inhale 2 puffs by mouth twice daily. May inhale 2 puffs by mouth every 4 hours as needed for wheezing.   Yes [provider]  digoxin (LANOXIN) 0.125 MG tablet Take 0.125 mg by mouth daily. Hold and notify MD if HR less than 60 04/25/20  Yes [provider]  Ensure (ENSURE) Take 237 mLs by mouth 2 (two) times daily between meals.   Yes [provider]  esomeprazole (NEXIUM) 10 MG  packet Take 10 mg by mouth every 12 (twelve) hours as needed for heartburn.   Yes [provider]  flecainide (TAMBOCOR) 50 MG tablet Take 75 mg by mouth 2 (two) times daily. 07/27/20  Yes [provider]  furosemide (LASIX) 20 MG tablet Take 20 mg by mouth daily. 08/05/20  Yes [provider]  levalbuterol (XOPENEX) 0.63 MG/3ML nebulizer solution Take 3 mLs by nebulization every 6 (six) hours as  needed for wheezing. 07/20/19  Yes [provider]  metoprolol tartrate (LOPRESSOR) 25 MG tablet Take 25 mg by mouth 2 (two) times daily. 04/25/20  Yes [provider]  Multiple Vitamins-Minerals (MULTIVITAMIN WITH MINERALS) tablet Take 1 tablet by mouth daily.   Yes [provider]  OXYGEN Inhale 2 L into the lungs continuous.   Yes [provider]  TRELEGY ELLIPTA 100-62.5-25 MCG/INH AEPB Take 1 puff by mouth daily. 08/06/20  Yes [provider]    ___________________________________________________________________________________________________ Physical Exam: Vitals with BMI 12/03/2020 12/03/2020 12/03/2020  Height - - -  Weight - - -  BMI - - -  Systolic 446 286 381  Diastolic 66 83 68  Pulse 771 110 108     1. General:  in No  Acute distress    Chronically ill  -appearing 2. Psychological: Alert and   Oriented to self 3. Head/ENT:   Moist  Mucous Membranes                          Head Non traumatic, neck supple                          Poor Dentition 4. SKIN: normal  Skin turgor,  Skin clean Dry and intact lower extremity edema reviewed left lower extremity red erythematous and warm 5. Heart: Regular rate and rhythm no  Murmur, no Rub or gallop 6. Lungs:  , no wheezes mild crackles   diminished on the left  7. Abdomen: Soft,  non-tender, Non distended  bowel sounds present 8. Lower extremities: no clubbing, cyanosis, 3+ edema left worse than the right 9. Neurologically Grossly intact, moving all 4 extremities equally   intact 10. MSK: Normal range of motion    Chart has been reviewed  ______________________________________________________________________________________________  Assessment/Plan   87 y.o. female with medical history significant of   COPD 2 L nasal cannula, sick sinus syndrome, CHF    large left-sided pleural effusion, Covid positive September 12 2020 Admitted for  UTI, pleural effusion possible cellulitis  Present on Admission:  Sepsis ([place]) -  -SIRS criteria met with  elevated white blood cell count,       Component Value Date/Time   WBC 24.2 (H) 12/03/2020 1058   LYMPHSABS 0.8 12/03/2020 1058    tachycardia   ,  RR >20 Today's Vitals   12/03/20 1400 12/03/20 1500 12/03/20 1600 12/03/20 1700  BP: (!) 113/59 126/68 128/83 124/66  Pulse: (!) 101 (!) 108 (!) 110 (!) 103  Resp: (!) 21 (!) 23 (!) 26 (!) 31  Temp:      TempSrc:      SpO2: 96% 100% 100% 100%    -Most likely source being:  urinary,    Patient meeting criteria for Severe sepsis with    evidence of end organ damage/organ dysfunction such as  elevated lactic acid >2     Component Value Date/Time   LATICACIDVEN 2.0 (HH) 12/03/2020 1327       - Obtain serial lactic acid and procalcitonin level.  - Initiated IV antibiotics in ER: Antibiotics Given (last 72 hours)     Date/Time Action Medication Dose Rate   12/03/20 1157 New Bag/Given   ceFEPIme (MAXIPIME) 2 g in sodium chloride 0.9 % 100 mL IVPB 2 g 200 mL/hr   12/03/20 1201 New Bag/Given   metroNIDAZOLE (FLAGYL) IVPB 500 mg 500 mg 100 mL/hr   12/03/20 1326  New Bag/Given   vancomycin (VANCOCIN) IVPB 1000 mg/200 mL premix 1,000 mg 200 mL/hr       Will continue  on : Rocephin for treatment of UTI and possible cellulitis   - await results of blood and urine culture  - Rehydrate aggressively  Intravenous fluids were administered, lactated Ringer's 2000+ml's. Patient persistently edematous now stating she feels a little more short of breath discussed with family  we will hold off on over aggressive fluid resuscitation    8:18 PM  Lower extremity swelling a.m. pleural effusion -most likely secondary to hypoalbuminemia.  Blood pressure now improved patient is likely third spacing given a dose of albumin she may need actual restarting of her Lasix if blood pressure allows and sepsis resolves  Possible cellulitis of left lower extremity.  Legs edematous but left lower extremity appears to be more so with redness and blistering. Covered with antibiotics Rocephin order MRSA serologies Also given edema worse left than right obtain Dopplers to rule out DVTs patient has diminished ability to ambulate and at high risk  Hypoalbuminemia suggest team likely secondary to poor nutritional status order nutritional consult Ensure check prealbumin   Chronic diastolic CHF.  Resume diuretics when able to tolerate.  Third spacing could be in the setting of hypoalbuminemia initially patient appears to have been intravascularly depleted which improved with IV fluids but avoid over aggressive fluid resuscitation   Sick sinus syndrome (HCC) -chronic stable continue home medication check digoxin level   Pleural effusion on left -recurrent may be combination of hypoalbuminemia and diastolic CHF.  Discussed with family who is agreeable to undergo repeat thoracentesis as this may help prevent further readmissions   COPD (chronic obstructive pulmonary disease) ([place]) chronic stable continue home medications   COVID-19 virus infection - COVID infection -  vaccinated with J&J   not boosted  So far mild URI symptoms no evidence of hypoxia  asymptomatic  CXR with no infiltrates just pleural effusion CT value 42.1  Cycle Threshold >32 (indicates low levels of virus)  Recommend supportive care only  Paxlovid is recommended if Cycle Threshold < 32   Unlikely to be active disease   Acute lower UTI -  - treat with Rocephin        await results of urine culture and adjust antibiotic  coverage as needed  AKI -hold Lasix repeat after patient has been rehydrated Obtain your electrolytes  Other plan as per orders.  DVT prophylaxis:  SCD      Code Status:  DNR/DNI   as per patient family  I had personally discussed CODE STATUS with patient and family     Family Communication:   Family not at  Bedside  plan of care was discussed on the phone with  Daughter     Disposition Plan:                            Back to current facility when stable                            Following barriers for discharge:                            white count improving able to transition to PO antibiotics  Will need to be able to tolerate PO                              Would benefit from PT/OT eval prior to DC  Ordered                                       Transition of care consulted                   Nutrition    consulted                                    Palliative care    consulted                    Consults called: none  Admission status:  ED Disposition     ED Disposition  Admit   Condition  --   [place]: [facility] [[id number]]  Level of Care: Progressive [102]  Admit to Progressive based on following criteria: MULTISYSTEM THREATS such as stable sepsis, metabolic/electrolyte imbalance with or without encephalopathy that is responding to early treatment.  May admit patient to [facility] or [facility] if equivalent level of care is available:: No  Covid Evaluation: Confirmed COVID Positive  Diagnosis: Sepsis [facility]) [[id number]]  Admitting Physician: Toy Baker [[id number]]  Attending Physician: Toy Baker [[id number]]  Estimated length of stay: past midnight tomorrow  Certification:: I certify this patient will need inpatient services for at least 2 midnights           inpatient     I Expect 2 midnight stay secondary to severity of patient's current illness need for inpatient interventions  justified by the following:  hemodynamic instability despite optimal treatment (tachycardia)   Severe lab/radiological/exam abnormalities including:    Pleural effusion and extensive comorbidities including:   CHF  COPD/asthma    That are currently affecting medical management.   I expect  patient to be hospitalized for 2 midnights requiring inpatient medical care.  Patient is at high risk for adverse outcome (such as loss of life or disability) if not treated.  Indication for inpatient stay as follows:    Hemodynamic instability despite maximal medical therapy,    Need for operative/procedural  intervention New or worsening hypoxia  Need for IV antibiotics,     Level of care   progressive tele indefinitely please discontinue once patient no longer qualifies COVID-19 Labs    Lab Results  Component Value Date   SARSCOV2NAA POSITIVE (A) 12/03/2020     Precautions: admitted as  covid positive     PPE: Used by the provider:   N95  eye Goggles,  Gloves  gown   Dornell Grasmick 12/03/2020, 8:15 PM    [facility]     after 2 AM please page floor coverage PA If 7AM-7PM, please contact the day team taking care of the patient using [email or web address]   Patient was evaluated in the context of the global COVID-19 pandemic, which necessitated consideration that the patient might be at risk for infection with the SARS-CoV-2 virus that causes COVID-19. Institutional protocols and algorithms that pertain to the evaluation of patients at risk for COVID-19 are in a state of rapid change based on  information released by regulatory bodies including the CDC and federal and state organizations. These policies and algorithms were followed during the patient's care.

## 2020-12-03 NOTE — ED Triage Notes (Signed)
Pt arrived via EMS, from [facility], c/o generalized weakness started today. Hypotensive 80/60s on EMS arrival. Pitting edema bilateral legs.   20G L FA  Given 100cc NS en route

## 2020-12-03 NOTE — ED Notes (Signed)
Pts daughter, [name], provided update on pts plan of care.

## 2020-12-03 NOTE — ED Provider Notes (Signed)
[facility] DEPT Provider Note   CSN: [id number] Arrival date & time: 12/03/20  1030     History Chief Complaint  Patient presents with   Fatigue    Samantha Fitzgerald is a 87 y.o. female who presents via EMS from [facility] facility with concern for generalized weakness and low blood pressure at the facility today.  At the bedside patient is coherent and able to converse with me.  States that she feels normal for her but does endorse some increased lower extremity swelling for the last few weeks.  Level 5 caveat due to patient's acuity upon presentation.  I personally reviewed this patient's medical records.  She has history of sick sinus syndrome on flecainide and digoxin, COPD, and CHF listed in her chart, however she had echocardiogram in 09/2020 which revealed LVEF of 65 to 70%.  She was admitted in July of this year for large left pleural effusion and underwent thoracentesis with 850 cc removed.  She was COVID-19 positive at that time.  Chronically on 2 L by nasal cannula.  HPI     History reviewed. No pertinent past medical history.  Patient Active Problem List   Diagnosis Date Noted   Pleural effusion on left 09/12/2020   COPD (chronic obstructive pulmonary disease) ([place]) 09/12/2020   Sick sinus syndrome ([place]) 09/12/2020   Hypoxia 09/12/2020    History reviewed. No pertinent surgical history.   OB History   No obstetric history on file.     History reviewed. No pertinent family history.  Social History   Tobacco Use   Smoking status: Former    Packs/day: 1.00    Years: 25.00    Pack years: 25.00    Types: Cigarettes    Quit date: 10/01/1963    Years since quitting: 57.2    Passive exposure: Never   Smokeless tobacco: Never  Vaping Use   Vaping Use: Never used  Substance Use Topics   Alcohol use: Never   Drug use: Never    Home Medications Prior to Admission medications   Medication Sig Start Date End Date  Taking? Authorizing Provider  albuterol (VENTOLIN HFA) 108 (90 Base) MCG/ACT inhaler Inhale 2 puffs into the lungs See admin instructions. Inhale 2 puffs by mouth twice daily. May inhale 2 puffs by mouth every 4 hours as needed for wheezing.   Yes [provider]  digoxin (LANOXIN) 0.125 MG tablet Take 0.125 mg by mouth daily. Hold and notify MD if HR less than 60 04/25/20  Yes [provider]  Ensure (ENSURE) Take 237 mLs by mouth 2 (two) times daily between meals.   Yes [provider]  esomeprazole (NEXIUM) 10 MG packet Take 10 mg by mouth every 12 (twelve) hours as needed for heartburn.   Yes [provider]  flecainide (TAMBOCOR) 50 MG tablet Take 75 mg by mouth 2 (two) times daily. 07/27/20  Yes [provider]  furosemide (LASIX) 20 MG tablet Take 20 mg by mouth daily. 08/05/20  Yes [provider]  levalbuterol (XOPENEX) 0.63 MG/3ML nebulizer solution Take 3 mLs by nebulization every 6 (six) hours as needed for wheezing. 07/20/19  Yes [provider]  metoprolol tartrate (LOPRESSOR) 25 MG tablet Take 25 mg by mouth 2 (two) times daily. 04/25/20  Yes [provider]  Multiple Vitamins-Minerals (MULTIVITAMIN WITH MINERALS) tablet Take 1 tablet by mouth daily.   Yes [provider]  OXYGEN Inhale 2 L into the lungs continuous.   Yes  [provider]  TRELEGY ELLIPTA 100-62.5-25 MCG/INH AEPB Take 1 puff by mouth daily. 08/06/20  Yes [provider]    Allergies    Amoxicillin, Doxycycline, Levofloxacin, and Morphine and related  Review of Systems   Review of Systems  Constitutional:  Positive for fatigue. Negative for activity change, appetite change, chills and fever.  HENT: Negative.    Eyes: Negative.   Respiratory: Negative.    Cardiovascular:  Positive for leg swelling. Negative for chest pain and palpitations.  Gastrointestinal: Negative.   Genitourinary: Negative.   Neurological: Negative.     Physical Exam Updated Vital Signs BP 128/83   Pulse (!) 110   Temp 98.3 F (36.8 C) (Rectal)   Resp (!) 26   SpO2 100%   Physical Exam Vitals and nursing note reviewed.  Constitutional:      Appearance: She is not ill-appearing or toxic-appearing.  HENT:     Head: Normocephalic and atraumatic.     Nose: Nose normal. No congestion.     Mouth/Throat:     Mouth: Mucous membranes are moist.     Pharynx: Oropharynx is clear. Uvula midline. No oropharyngeal exudate or posterior oropharyngeal erythema.     Tonsils: No tonsillar exudate.  Eyes:     General: Lids are normal. Vision grossly intact.        Right eye: No discharge.        Left eye: No discharge.     Extraocular Movements: Extraocular movements intact.     Conjunctiva/sclera: Conjunctivae normal.     Pupils: Pupils are equal, round, and reactive to light.  Neck:     Trachea: Trachea and phonation normal.  Cardiovascular:     Rate and Rhythm: Normal rate and regular rhythm.     Pulses: Normal pulses.     Heart sounds: Normal heart sounds. No murmur heard. Pulmonary:     Effort: Pulmonary effort is normal. No tachypnea, bradypnea, accessory muscle usage, prolonged expiration or respiratory distress.     Breath sounds: Normal breath sounds. No wheezing or rales.     Comments: On 2L supplemental O2 by [place] Chest:     Chest wall: No mass, lacerations, deformity, swelling, tenderness, crepitus or edema.  Abdominal:     General: Bowel sounds are normal. There is no distension.     Palpations: Abdomen is soft.     Tenderness: There is no abdominal tenderness. There is no right CVA tenderness, left CVA tenderness, guarding or rebound.  Musculoskeletal:        General: No deformity.     Cervical back: Normal range of motion and neck supple. No edema, rigidity or crepitus. No pain with movement, spinous process tenderness or muscular tenderness.     Right lower leg: 3+ Pitting Edema present.     Left lower leg: 3+ Pitting  Edema present.       Legs:  Lymphadenopathy:     Cervical: No cervical adenopathy.  Skin:    General: Skin is warm and dry.     Capillary Refill: Capillary refill takes less than 2 seconds.  Neurological:     General: No focal deficit present.     Mental Status: She is alert and oriented to person, place, and time. Mental status is at baseline.     GCS: GCS eye subscore is 4. GCS verbal subscore is 5. GCS motor subscore is 6.     Cranial Nerves: Cranial nerves are intact.     Sensory: Sensation is intact.  Psychiatric:        Mood and Affect: Mood normal.    ED Results / Procedures / Treatments   Labs (all labs ordered are listed, but only abnormal results are displayed) Labs Reviewed  RESP PANEL BY RT-PCR (FLU A&B, COVID) ARPGX2 - Abnormal; Notable for the following components:      Result Value   SARS Coronavirus 2 by RT PCR POSITIVE (*)    All other components within normal limits  COMPREHENSIVE METABOLIC PANEL - Abnormal; Notable for the following components:   Chloride 93 (*)    Glucose, Bld 134 (*)    Calcium 8.6 (*)    Total Protein 6.3 (*)    Albumin 2.9 (*)    GFR, Estimated 52 (*)    All other components within normal limits  LACTIC ACID, PLASMA - Abnormal; Notable for the following components:   Lactic Acid, Venous 2.2 (*)    All other components within normal limits  LACTIC ACID, PLASMA - Abnormal; Notable for the following components:   Lactic Acid, Venous 2.0 (*)    All other components within normal limits  CBC WITH DIFFERENTIAL/PLATELET - Abnormal; Notable for the following components:   WBC 24.2 (*)    Neutro Abs 22.2 (*)    Abs Immature Granulocytes 0.31 (*)    All other components within normal limits  URINALYSIS, ROUTINE W REFLEX MICROSCOPIC - Abnormal; Notable for the following components:   Color, Urine STRAW (*)    APPearance TURBID (*)    Glucose, UA   (*)    Value: TEST NOT REPORTED DUE TO COLOR INTERFERENCE OF URINE PIGMENT   Hgb urine  dipstick   (*)    Value: TEST NOT REPORTED DUE TO COLOR INTERFERENCE OF URINE PIGMENT   Bilirubin Urine   (*)    Value: TEST NOT REPORTED DUE TO COLOR INTERFERENCE OF URINE PIGMENT   Ketones, ur   (*)    Value: TEST NOT REPORTED DUE TO COLOR INTERFERENCE OF URINE PIGMENT   Protein, ur   (*)    Value: TEST NOT REPORTED DUE TO COLOR INTERFERENCE OF URINE PIGMENT   Nitrite   (*)    Value: TEST NOT REPORTED DUE TO COLOR INTERFERENCE OF URINE PIGMENT   Leukocytes,Ua   (*)    Value: TEST NOT REPORTED DUE TO COLOR INTERFERENCE OF URINE PIGMENT   All other components within normal limits  BRAIN NATRIURETIC PEPTIDE - Abnormal; Notable for the following components:   B Natriuretic Peptide 1,334.5 (*)    All other components within normal limits  URINALYSIS, MICROSCOPIC (REFLEX) - Abnormal; Notable for the following components:   Bacteria, UA MANY (*)    All other components within normal limits  CULTURE, BLOOD (ROUTINE X 2)  CULTURE, BLOOD (ROUTINE X 2)  URINE CULTURE  PROTIME-INR    EKG EKG: junctional tach, no STEMI..  Radiology DG Chest 2 View  Result Date: 12/03/2020 CLINICAL DATA:  Weakness, hypotensive, pitting in legs EXAM: CHEST - 2 VIEW COMPARISON:  Chest radiograph 09/15/2020 FINDINGS: The heart is mildly enlarged, unchanged. The mediastinal contours are stable. There is a small to moderate size left pleural effusion, increased in size since 09/16/2020. There is adjacent airspace disease. The left upper lobe is well aerated. There is no focal consolidation on the right. There is no significant right pleural effusion. There is no pneumothorax. There is no acute osseous abnormality. IMPRESSION: Since 09/15/2020, there has been interval increase in size of the small to moderate size  left pleural effusion with worsening aeration at the left base. Electronically Signed   By: Valetta Mole M.D.   On: 12/03/2020 12:04    Procedures .Critical Care Performed by: Emeline Darling,  PA-C Authorized by: Emeline Darling, PA-C   Critical care provider statement:    Critical care time (minutes):  45   Critical care was time spent personally by me on the following activities:  Discussions with consultants, evaluation of patient's response to treatment, examination of patient, ordering and performing treatments and interventions, ordering and review of laboratory studies, ordering and review of radiographic studies, pulse oximetry, re-evaluation of patient's condition, obtaining history from patient or surrogate and review of old charts   Medications Ordered in ED Medications  lactated ringers infusion ( Intravenous New Bag/Given 12/03/20 1324)  lactated ringers bolus 1,000 mL (0 mLs Intravenous Stopped 12/03/20 1316)  ceFEPIme (MAXIPIME) 2 g in sodium chloride 0.9 % 100 mL IVPB (0 g Intravenous Stopped 12/03/20 1315)  metroNIDAZOLE (FLAGYL) IVPB 500 mg (0 mg Intravenous Stopped 12/03/20 1316)  vancomycin (VANCOCIN) IVPB 1000 mg/200 mL premix (0 mg Intravenous Stopped 12/03/20 1426)  lactated ringers bolus 500 mL (0 mLs Intravenous Stopped 12/03/20 1604)  lactated ringers bolus 500 mL (0 mLs Intravenous Stopped 12/03/20 1604)    ED Course  I have reviewed the triage vital signs and the nursing notes.  Pertinent labs & imaging results that were available during my care of the patient were reviewed by me and considered in my medical decision making (see chart for details).    MDM Rules/Calculators/A&P                         87 year old female presents with concern for generalized weakness and low blood pressure at her facility today.  The differential diagnosis of weakness includes but is not limited to: Neurologic causes: GBS, myasthenia gravis, CVA, MS, ALS, transverse myelitis, spinal cord injury, CVA, botulism Other causes: ACS, Arrhythmia, syncope, orthostatic hypotension, sepsis, hypoglycemia, electrolyte disturbance, hypothyroidism, respiratory failure, symptomatic  anemia, dehydration, heat injury, polypharmacy, malignancy.  Tachycardic and hypertensive on intake, vital signs otherwise normal.  Cardiopulmonary exam revealed persistent tachycardia and mildly decreased breath sounds in the left lung base.  Abdominal exam is benign.  Patient with significant lower extremity edema from the knee down.  CBC with leukocytosis of 24,000, CMP unremarkable, coags normal, lactic acid elevated 2.2.  BNP significantly elevated 1335.  Respiratory pathogen panel positive for COVID-19.  Chest x-ray with interval increase in size of the small to moderate left-sided pleural effusion with worsening aeration of the left base.  Patient has been covered with broad-spectrum antibiotics given septic presentation with unknown source upon arrival.  Concern for fluid overload given significant lower extremity edema, however patient remains hypotensive.  Sepsis order set 30 cc/kg ordered.  Side lactic acid remains elevated to 2.0.  Patient is COVID-positive on respiratory pathogen panel.  This provider was called to the bedside at time of straight catheterization for UA; urine is purulent, very thick.  UA with turbid appearance, unable to be tested further due to density of fluid.  Urine culture pending.  Feel this patient would benefit from admission to the hospital given picture of urosepsis plus COVID-19. [place] voiced understanding of her medical evaluation and treatment plan thus far.  Each of her questions answered to her expressed satisfaction.  She is amenable to plan for admission at this time.  Consult to hospitalist pending at time of  shift change; they have been paged twice and are telling her 1 hour to call back.  Patient signed out to oncoming ED provider Paulita Cradle, PA-C at time of shift change.  Pending consult to hospitalist for admission for urosepsis.  This chart was dictated using voice recognition software, Dragon. Despite the best efforts of this provider to  proofread and correct errors, errors may still occur which can change documentation meaning.   Final Clinical Impression(s) / ED Diagnoses Final diagnoses:  Sepsis, due to unspecified organism, unspecified whether acute organ dysfunction present [facility])    Rx / DC Orders ED Discharge Orders     None        Aura Dials 12/03/20 1640    Daleen Bo, MD 12/03/20 1726

## 2020-12-04 ENCOUNTER — Inpatient Hospital Stay (HOSPITAL_COMMUNITY): Payer: Medicare Other

## 2020-12-04 ENCOUNTER — Other Ambulatory Visit: Payer: Self-pay

## 2020-12-04 DIAGNOSIS — I5032 Chronic diastolic (congestive) heart failure: Secondary | ICD-10-CM | POA: Diagnosis not present

## 2020-12-04 DIAGNOSIS — R609 Edema, unspecified: Secondary | ICD-10-CM

## 2020-12-04 DIAGNOSIS — A419 Sepsis, unspecified organism: Secondary | ICD-10-CM | POA: Diagnosis not present

## 2020-12-04 DIAGNOSIS — J449 Chronic obstructive pulmonary disease, unspecified: Secondary | ICD-10-CM | POA: Diagnosis not present

## 2020-12-04 DIAGNOSIS — N390 Urinary tract infection, site not specified: Secondary | ICD-10-CM | POA: Diagnosis not present

## 2020-12-04 DIAGNOSIS — J9601 Acute respiratory failure with hypoxia: Secondary | ICD-10-CM

## 2020-12-04 LAB — PROTIME-INR
INR: 1.1 (ref 0.8–1.2)
Prothrombin Time: 13.8 s (ref 11.4–15.2)

## 2020-12-04 LAB — COMPREHENSIVE METABOLIC PANEL
ALT: 12 U/L (ref 0–44)
AST: 20 U/L (ref 15–41)
Albumin: 2.7 g/dL — ABNORMAL LOW (ref 3.5–5.0)
Alkaline Phosphatase: 78 U/L (ref 38–126)
Anion gap: 7 (ref 5–15)
BUN: 18 mg/dL (ref 8–23)
CO2: 33 mmol/L — ABNORMAL HIGH (ref 22–32)
Calcium: 8.6 mg/dL — ABNORMAL LOW (ref 8.9–10.3)
Chloride: 98 mmol/L (ref 98–111)
Creatinine, Ser: 0.86 mg/dL (ref 0.44–1.00)
GFR, Estimated: >60 mL/min (ref >60)
Glucose, Bld: 83 mg/dL (ref 70–99)
Potassium: 4.2 mmol/L (ref 3.5–5.1)
Sodium: 138 mmol/L (ref 135–145)
Total Bilirubin: 0.6 mg/dL (ref 0.3–1.2)
Total Protein: 5.7 g/dL — ABNORMAL LOW (ref 6.5–8.1)

## 2020-12-04 LAB — BLOOD CULTURE ID PANEL (REFLEXED) - BCID2

## 2020-12-04 LAB — MAGNESIUM: Magnesium: 1.8 mg/dL (ref 1.7–2.4)

## 2020-12-04 LAB — CBC WITH DIFFERENTIAL/PLATELET
Abs Immature Granulocytes: 0.13 10*3/uL — ABNORMAL HIGH (ref 0.00–0.07)
Basophils Absolute: 0.1 10*3/uL (ref 0.0–0.1)
Basophils Relative: 0 %
Eosinophils Absolute: 0.2 10*3/uL (ref 0.0–0.5)
Eosinophils Relative: 1 %
HCT: 32.7 % — ABNORMAL LOW (ref 36.0–46.0)
Hemoglobin: 10.5 g/dL — ABNORMAL LOW (ref 12.0–15.0)
Immature Granulocytes: 1 %
Lymphocytes Relative: 6 %
Lymphs Abs: 1 10*3/uL (ref 0.7–4.0)
MCH: 28.8 pg (ref 26.0–34.0)
MCHC: 32.1 g/dL (ref 30.0–36.0)
MCV: 89.8 fL (ref 80.0–100.0)
Monocytes Absolute: 0.9 10*3/uL (ref 0.1–1.0)
Monocytes Relative: 5 %
Neutro Abs: 14.4 10*3/uL — ABNORMAL HIGH (ref 1.7–7.7)
Neutrophils Relative %: 87 %
Platelets: 321 10*3/uL (ref 150–400)
RBC: 3.64 MIL/uL — ABNORMAL LOW (ref 3.87–5.11)
RDW: 13.9 % (ref 11.5–15.5)
WBC: 16.7 10*3/uL — ABNORMAL HIGH (ref 4.0–10.5)
nRBC: 0 % (ref 0.0–0.2)

## 2020-12-04 LAB — TROPONIN I (HIGH SENSITIVITY)
Troponin I (High Sensitivity): 109 ng/L
Troponin I (High Sensitivity): 116 ng/L (ref <18)

## 2020-12-04 LAB — C-REACTIVE PROTEIN
CRP: 9.2 mg/dL — ABNORMAL HIGH (ref <1.0)
CRP: 9.2 mg/dL — ABNORMAL HIGH (ref <1.0)

## 2020-12-04 LAB — TSH: TSH: 2.915 u[IU]/mL (ref 0.350–4.500)

## 2020-12-04 LAB — PREALBUMIN: Prealbumin: 5.4 mg/dL — ABNORMAL LOW (ref 18–38)

## 2020-12-04 LAB — FIBRINOGEN: Fibrinogen: 529 mg/dL — ABNORMAL HIGH (ref 210–475)

## 2020-12-04 LAB — PHOSPHORUS: Phosphorus: 3.8 mg/dL (ref 2.5–4.6)

## 2020-12-04 LAB — FERRITIN
Ferritin: 112 ng/mL (ref 11–307)
Ferritin: 116 ng/mL (ref 11–307)

## 2020-12-04 LAB — SODIUM, URINE, RANDOM: Sodium, Ur: <10 mmol/L

## 2020-12-04 LAB — D-DIMER, QUANTITATIVE: D-Dimer, Quant: 2.88 ug/mL-FEU — ABNORMAL HIGH (ref 0.00–0.50)

## 2020-12-04 LAB — CREATININE, URINE, RANDOM: Creatinine, Urine: 130.69 mg/dL

## 2020-12-04 LAB — MRSA NEXT GEN BY PCR, NASAL: MRSA by PCR Next Gen: NOT DETECTED

## 2020-12-04 MED ORDER — FUROSEMIDE 10 MG/ML IJ SOLN
20.0000 mg | Freq: Once | INTRAMUSCULAR | Status: AC
  Administered 2020-12-04: 20 mg via INTRAVENOUS
  Filled 2020-12-04: qty 4

## 2020-12-04 MED ORDER — FLUTICASONE FUROATE-VILANTEROL 200-25 MCG/INH IN AEPB
1.0000 | INHALATION_SPRAY | Freq: Every day | RESPIRATORY_TRACT | Status: DC
  Administered 2020-12-05 – 2020-12-14 (×9): 1 via RESPIRATORY_TRACT
  Filled 2020-12-04: qty 28

## 2020-12-04 MED ORDER — UMECLIDINIUM BROMIDE 62.5 MCG/INH IN AEPB
1.0000 | INHALATION_SPRAY | Freq: Every day | RESPIRATORY_TRACT | Status: DC
  Administered 2020-12-05 – 2020-12-14 (×9): 1 via RESPIRATORY_TRACT
  Filled 2020-12-04: qty 7

## 2020-12-04 MED ORDER — UMECLIDINIUM BROMIDE 62.5 MCG/INH IN AEPB
1.0000 | INHALATION_SPRAY | Freq: Every day | RESPIRATORY_TRACT | Status: DC
  Filled 2020-12-04: qty 7

## 2020-12-04 MED ORDER — ENOXAPARIN SODIUM 60 MG/0.6ML IJ SOSY
1.0000 mg/kg | PREFILLED_SYRINGE | Freq: Two times a day (BID) | INTRAMUSCULAR | Status: DC
  Administered 2020-12-04 – 2020-12-13 (×17): 60 mg via SUBCUTANEOUS
  Filled 2020-12-04 (×17): qty 0.6

## 2020-12-04 MED ORDER — FUROSEMIDE 10 MG/ML IJ SOLN
40.0000 mg | Freq: Two times a day (BID) | INTRAMUSCULAR | Status: DC
  Administered 2020-12-04 – 2020-12-07 (×6): 40 mg via INTRAVENOUS
  Filled 2020-12-04 (×6): qty 4

## 2020-12-04 MED ORDER — FLUTICASONE FUROATE-VILANTEROL 200-25 MCG/INH IN AEPB
1.0000 | INHALATION_SPRAY | Freq: Every day | RESPIRATORY_TRACT | Status: DC
  Filled 2020-12-04: qty 28

## 2020-12-04 MED ORDER — CHLORHEXIDINE GLUCONATE CLOTH 2 % EX PADS
6.0000 | MEDICATED_PAD | Freq: Every day | CUTANEOUS | Status: DC
  Administered 2020-12-05 – 2020-12-12 (×8): 6 via TOPICAL

## 2020-12-04 MED ORDER — LIDOCAINE HCL 1 % IJ SOLN
INTRAMUSCULAR | Status: AC
  Filled 2020-12-04: qty 20

## 2020-12-04 MED ORDER — FUROSEMIDE 10 MG/ML IJ SOLN: 40.0000 mg | Freq: Two times a day (BID) | INTRAMUSCULAR | Status: DC

## 2020-12-04 NOTE — ED Notes (Signed)
Spoke with pharmacy to verify albumin order and dosage amount. They should receive the entire amount per pharmacy.

## 2020-12-04 NOTE — ED Notes (Signed)
Patient began pursed lip breathing again. Albuterol given and patient was repositioned in bed. Patient is now on 3L [place]

## 2020-12-04 NOTE — Progress Notes (Signed)
PROGRESS NOTE    Samantha Fitzgerald  NWG:[id number] DOB: [birth year] DOA: 12/03/2020 PCP: System, Provider Not In   Brief Narrative:  Samantha Fitzgerald is a 87 y.o. female with medical history significant of COPD 2 L nasal cannula, sick sinus syndrome, Grade 1 diastolic HF, with recurrent large left-sided pleural effusion, recently diagnosed with covid June/July 2022.  Patient presents to the ED from [facility] facility with hypotension general weakness with reportedly acute onset blood pressure noted to be as low as 80/60 with bilateral lower extremity edema.  Assessment & Plan:   Severe Sepsis ([place]) secondary to UTI, POA Rule out cellulitis of left lower extremity - Leukocytosis, tachycardia with lactic acidosis = severe sepsis - Procalcitonin surprisingly low - follow repeat; if remains negative would evaluate for early discontinuation of antibiotics and focus more on volume status/diuresis/thoracentesis if necessary - Cefepime, flagyl, vancomycin given at intake --> transition to ceftriaxone only at this time, continue to follow cultures - Early urine culture shows Proteus vulgaris with susceptibilities to follow - Lower extremity redness and blistering in the setting of edema as below  Acute on chronic hypoxic respiratory failure  -Patient required upwards of 5 L nasal cannula over the past 24 hours -Baseline oxygen 2 L nasal cannula around-the-clock -Likely partially secondary to pleural effusions (right greater than left) in the setting of hypoalbuminemia as below and third spacing - Consider thoracentesis in the next 24h if not responding to lasix  Lower extremity edema Pleural effusion Hypoalbuminemia -High risk for third spacing, avoid any further IV fluids, considering diuretics despite borderline hypotension -Hold off on albumin at this time and follow clinically -likely secondary to poor p.o. intake    Acute DVT of the right common femoral vein and SF  junction Acute superficial venous thrombosis  Elevated D-dimer  -Initiate full dose Lovenox to avoid additional volume of heparin drip -We will need to discuss risks and benefits of anticoagulation in a 87 year old with high fall risk - discussed at length with daughter. Patient is high fall risk but has assistance at facility while ambulating.  Chronic diastolic CHF, grade 1 -  Unlikely in acute exacerbation.  -Resume diuretics now that BP has improved after IVF bolus at intake. -Avoid IVF unless absolutely necessary for hypotension  Sick sinus syndrome (HCC)  Stable - continue digoxin - level WNL (1.4)   COPD (chronic obstructive pulmonary disease) (HCC) chronic stable continue home medications - not in acute exacerbation - hypoxia appears to be driven by volume overload/effusions.   COVID-19 virus detected - NOT ACUTE ILLNESS OR INFECTION - Imaging clear, patient without resipratory symptoms - Recent infection late June/July - patients are known to test positive weeks to months post active infection without symptoms - Cycle threshold 42 - indicative of non-active disease  DVT prophylaxis: Lovenox, full dose Code Status: DNR Family Communication: Lengthy discussion with daughter over the phone about prognosis, multi comorbid conditions and acute illnesses.  Status is: Inpatient  Dispo: The patient is from: [facility]              Anticipated d/c is to: Same              Anticipated d/c date is: >72h              Patient currently not medically stable for discharge  Consultants:  None  Procedures:  None  Antimicrobials:  Ceftriaxone  Subjective: No acute issues or events overnight, patient's respiratory status appears to be stabilizing, requiring more  oxygen than previous but symptoms appear to be improving.  Denies chest pain nausea vomiting diarrhea constipation headache fevers chills or palpitations  Objective: Vitals:   12/04/20 0430 12/04/20 0500 12/04/20 0530  12/04/20 0600  BP: (!) 143/64 (!) 154/59 90/77 (!) 171/66  Pulse: 72 71 64 80  Resp: (!) 27 17 (!) 21 (!) 28  Temp:      TempSrc:      SpO2: 92% 99% 100% 99%    Intake/Output Summary (Last 24 hours) at 12/04/2020 0757 Last data filed at 12/04/2020 8563 Gross per 24 hour  Intake 2646.6 ml  Output 400 ml  Net 2246.6 ml   There were no vitals filed for this visit.  Examination:  General: Pleasantly resting in bed, No acute distress. Chronically ill appearing HEENT: Normocephalic atraumatic. Sclerae nonicteric, noninjected.  Extraocular movements intact bilaterally. Neck: Without mass or deformity.  Trachea is midline. Lungs: Bilateral rales without overt wheeze or rhonchi diminished globally. Heart: Regular rate and rhythm.  Without murmurs, rubs, or gallops. Abdomen: Soft, nontender, nondistended.  Without guarding or rebound. Extremities: 3+ pitting edema bilaterally right slightly worse than left.  Without clubbing or cyanosis Vascular: Dorsalis pedis and posterior tibial pulses palpable bilaterally. Skin: Warm and dry  Data Reviewed: I have personally reviewed following labs and imaging studies  CBC: Recent Labs  Lab 12/03/20 1058 12/04/20 0420  WBC 24.2* 16.7*  NEUTROABS 22.2* 14.4*  HGB 12.0 10.5*  HCT 37.7 32.7*  MCV 90.8 89.8  PLT 355 149   Basic Metabolic Panel: Recent Labs  Lab 12/03/20 1058 12/03/20 2128 12/04/20 0420  NA 135  --  138  K 4.0  --  4.2  CL 93*  --  98  CO2 30  --  33*  GLUCOSE 134*  --  83  BUN 17  --  18  CREATININE 1.00  --  0.86  CALCIUM 8.6*  --  8.6*  MG  --  1.8 1.8  PHOS  --  3.8 3.8   GFR: CrCl cannot be calculated (Unknown ideal weight.). Liver Function Tests: Recent Labs  Lab 12/03/20 1058 12/04/20 0420  AST 20 20  ALT 14 12  ALKPHOS 94 78  BILITOT 0.7 0.6  PROT 6.3* 5.7*  ALBUMIN 2.9* 2.7*   No results for input(s): LIPASE, AMYLASE in the last 168 hours. No results for input(s): AMMONIA in the last 168  hours. Coagulation Profile: Recent Labs  Lab 12/03/20 1058  INR 1.0   Cardiac Enzymes: Recent Labs  Lab 12/03/20 2128  CKTOTAL 31*   BNP (last 3 results) No results for input(s): PROBNP in the last 8760 hours. HbA1C: No results for input(s): HGBA1C in the last 72 hours. CBG: No results for input(s): GLUCAP in the last 168 hours. Lipid Profile: No results for input(s): CHOL, HDL, LDLCALC, TRIG, CHOLHDL, LDLDIRECT in the last 72 hours. Thyroid Function Tests: Recent Labs    12/04/20 0420  TSH 2.915   Anemia Panel: Recent Labs    12/04/20 0420  FERRITIN 112  116   Sepsis Labs: Recent Labs  Lab 12/03/20 1058 12/03/20 1327 12/03/20 2128  PROCALCITON  --   --  0.61  LATICACIDVEN 2.2* 2.0*  --     Recent Results (from the past 240 hour(s))  Resp Panel by RT-PCR (Flu A&B, Covid) Nasopharyngeal Swab     Status: Abnormal   Collection Time: 12/03/20 12:03 PM   Specimen: Nasopharyngeal Swab; Nasopharyngeal(NP) swabs in vial transport medium  Result Value Ref Range  Status   SARS Coronavirus 2 by RT PCR POSITIVE (A) NEGATIVE Final    Comment: RESULT CALLED TO, READ BACK BY AND VERIFIED WITH: JACOBS,D. RN @1310  ON 12/03/2020 BY COHEN,K (NOTE) SARS-CoV-2 target nucleic acids are DETECTED.  The SARS-CoV-2 RNA is generally detectable in upper respiratory specimens during the acute phase of infection. Positive results are indicative of the presence of the identified virus, but do not rule out bacterial infection or co-infection with other pathogens not detected by the test. Clinical correlation with patient history and other diagnostic information is necessary to determine patient infection status. The expected result is Negative.  Fact Sheet for Patients: EntrepreneurPulse.com.au  Fact Sheet for Healthcare Providers: IncredibleEmployment.be  This test is not yet approved or cleared by the [place] FDA and  has been authorized  for detection and/or diagnosis of SARS-CoV-2 by FDA under an Emergency Use Authorization (EUA).  This EUA will remain in effect (meaning this t est can be used) for the duration of  the COVID-19 declaration under Section 564(b)(1) of the Act, 21 U.S.C. section 360bbb-3(b)(1), unless the authorization is terminated or revoked sooner.     Influenza A by PCR NEGATIVE NEGATIVE Final   Influenza B by PCR NEGATIVE NEGATIVE Final    Comment: (NOTE) The Xpert Xpress SARS-CoV-2/FLU/RSV plus assay is intended as an aid in the diagnosis of influenza from Nasopharyngeal swab specimens and should not be used as a sole basis for treatment. Nasal washings and aspirates are unacceptable for Xpert Xpress SARS-CoV-2/FLU/RSV testing.  Fact Sheet for Patients: EntrepreneurPulse.com.au  Fact Sheet for Healthcare Providers: IncredibleEmployment.be  This test is not yet approved or cleared by the [place] FDA and has been authorized for detection and/or diagnosis of SARS-CoV-2 by FDA under an Emergency Use Authorization (EUA). This EUA will remain in effect (meaning this test can be used) for the duration of the COVID-19 declaration under Section 564(b)(1) of the Act, 21 U.S.C. section 360bbb-3(b)(1), unless the authorization is terminated or revoked.  Performed at [facility], [place] [address], [place], [place] [address]      Radiology Studies: DG Chest 2 View  Result Date: 12/03/2020 CLINICAL DATA:  Weakness, hypotensive, pitting in legs EXAM: CHEST - 2 VIEW COMPARISON:  Chest radiograph 09/15/2020 FINDINGS: The heart is mildly enlarged, unchanged. The mediastinal contours are stable. There is a small to moderate size left pleural effusion, increased in size since 09/16/2020. There is adjacent airspace disease. The left upper lobe is well aerated. There is no focal consolidation on the right. There is no significant right pleural effusion.  There is no pneumothorax. There is no acute osseous abnormality. IMPRESSION: Since 09/15/2020, there has been interval increase in size of the small to moderate size left pleural effusion with worsening aeration at the left base. Electronically Signed   By: Valetta Mole M.D.   On: 12/03/2020 12:04   DG CHEST PORT 1 VIEW  Result Date: 12/03/2020 CLINICAL DATA:  Dyspnea EXAM: PORTABLE CHEST 1 VIEW COMPARISON:  12/03/2020 FINDINGS: Cardiac enlargement. Bilateral pleural effusions, greater on the left, with left greater than right perihilar infiltration. This could represent edema or pneumonia. Surgical clips in the right hilum with hilar prominence, possibly lymphadenopathy. Old right rib fractures. Degenerative changes in the spine and shoulders. Calcification of the aorta. IMPRESSION: Bilateral pleural effusions and perihilar infiltrates, greater on the left. Postoperative changes and nodular prominence in the left hilum. Electronically Signed   By: Lucienne Capers M.D.   On: 12/03/2020 21:59  Scheduled Meds:  albuterol  2 puff Inhalation BID   digoxin  0.125 mg Oral Daily   feeding supplement  237 mL Oral BID BM   flecainide  75 mg Oral BID   Fluticasone-Umeclidin-Vilant  1 puff Oral Daily   furosemide  20 mg Intravenous Once   sodium chloride flush  3 mL Intravenous Q12H   Continuous Infusions:  sodium chloride     cefTRIAXone (ROCEPHIN)  IV 2 g (12/04/20 0601)     LOS: 1 day   Time spent: 28min  Yaxiel Minnie C Dalayza Zambrana, DO [facility]  If 7PM-7AM, please contact night-coverage [email or web address]  12/04/2020, 7:57 AM

## 2020-12-04 NOTE — ED Notes (Signed)
I rounded on the pt and provided her breakfast tray. The pt expressed she has no desire to eat at this time, pt currently has no complaints and stated that she is clean and does not require any perineal care, I informed the pt to please let us know if we can do anything for her at this time.

## 2020-12-04 NOTE — Progress Notes (Signed)
   PT Cancellation Note  Patient Details Name: Samantha Fitzgerald MRN: [id number] DOB: August 23, 1924   Cancelled Treatment:    Reason Eval/Treat Not Completed: Medical issues which prohibited therapy.   Claretha Cooper 12/04/2020, 12:33 PM Tresa Endo PT Acute Rehabilitation Services Pager [phone] Office [phone]

## 2020-12-04 NOTE — ED Notes (Signed)
Attempted in and out cath. Patient had urine returned with foul odor. Urine was think and grey in color. Unable to drain bladder due to the thickness of the urine.

## 2020-12-04 NOTE — ED Notes (Signed)
Nursing bring pt up before had opportunity to ackowledge, busy shift.

## 2020-12-04 NOTE — ED Notes (Signed)
Pt took 2 bites of apple sauce with pills, states she does not want to eat anymore

## 2020-12-04 NOTE — ED Notes (Signed)
Sats decreased to 88% while on 3L [place], increased O2 to 5L. Sats increased to 96%.

## 2020-12-04 NOTE — Progress Notes (Signed)
PHARMACY - PHYSICIAN COMMUNICATION CRITICAL VALUE ALERT - BLOOD CULTURE IDENTIFICATION (BCID)  Samantha Fitzgerald is an 87 y.o. female who presented to [facility] on 12/03/2020 with a chief complaint of general weakness, hypotension, low BP, bilateral lower extremity edema  Assessment:  Gram stain: gram positive cocci in clusters in 1/4 bottles, BCID showed staph epi with MecA resistance  Name of physician (or Provider) Contacted: Dr. Avon Gully  Current antibiotics: ceftriaxone 2g IV q24 hours for Proteus in urine culture  Changes to prescribed antibiotics recommended:  Likely contaminant as GPC in 1/4 bottles and patient has been afebrile.  Would not recommend escalating antibiotics at this time.  Continue to treat Proteus in urine with ceftriaxone.  Recommend repeating blood cultures.   Results for orders placed or performed during the hospital encounter of 12/03/20  Blood Culture ID Panel (Reflexed) (Collected: 12/03/2020 10:54 AM)  Result Value Ref Range   Enterococcus faecalis NOT DETECTED NOT DETECTED   Enterococcus Faecium NOT DETECTED NOT DETECTED   Listeria monocytogenes NOT DETECTED NOT DETECTED   Staphylococcus species DETECTED (A) NOT DETECTED   Staphylococcus aureus (BCID) NOT DETECTED NOT DETECTED   Staphylococcus epidermidis DETECTED (A) NOT DETECTED   Staphylococcus lugdunensis NOT DETECTED NOT DETECTED   Streptococcus species NOT DETECTED NOT DETECTED   Streptococcus agalactiae NOT DETECTED NOT DETECTED   Streptococcus pneumoniae NOT DETECTED NOT DETECTED   Streptococcus pyogenes NOT DETECTED NOT DETECTED   A.calcoaceticus-baumannii NOT DETECTED NOT DETECTED   Bacteroides fragilis NOT DETECTED NOT DETECTED   Enterobacterales NOT DETECTED NOT DETECTED   Enterobacter cloacae complex NOT DETECTED NOT DETECTED   Escherichia coli NOT DETECTED NOT DETECTED   Klebsiella aerogenes NOT DETECTED NOT DETECTED   Klebsiella oxytoca NOT DETECTED NOT DETECTED   Klebsiella  pneumoniae NOT DETECTED NOT DETECTED   Proteus species NOT DETECTED NOT DETECTED   Salmonella species NOT DETECTED NOT DETECTED   Serratia marcescens NOT DETECTED NOT DETECTED   Haemophilus influenzae NOT DETECTED NOT DETECTED   Neisseria meningitidis NOT DETECTED NOT DETECTED   Pseudomonas aeruginosa NOT DETECTED NOT DETECTED   Stenotrophomonas maltophilia NOT DETECTED NOT DETECTED   Candida albicans NOT DETECTED NOT DETECTED   Candida auris NOT DETECTED NOT DETECTED   Candida glabrata NOT DETECTED NOT DETECTED   Candida krusei NOT DETECTED NOT DETECTED   Candida parapsilosis NOT DETECTED NOT DETECTED   Candida tropicalis NOT DETECTED NOT DETECTED   Cryptococcus neoformans/gattii NOT DETECTED NOT DETECTED   Methicillin resistance mecA/C DETECTED (A) NOT DETECTED    Dimple Nanas, PharmD 12/04/2020 4:56 PM

## 2020-12-04 NOTE — Plan of Care (Signed)

## 2020-12-04 NOTE — Progress Notes (Signed)
BLE venous duplex has been completed.  Rodman Key, RN given preliminary results.  Results can be found under chart review under CV PROC. 12/04/2020 12:56 PM Marveline Profeta RVT, RDMS

## 2020-12-04 NOTE — Progress Notes (Signed)
    OVERNIGHT PROGRESS REPORT  Notified by RN for urinary retention as evaluated on bladder scan equipment and reduced output during shift.  I&O catheterization completed by staff that returned +/- 10 cc of "thick rancid milk like" urine with evidence of retention still found.    Order for indwelling foley completed.  Urine was previously ordered and sent to lab for admission suspicion for UTI.  Gershon Cull MSNA MSN ACNPC-AG Acute Care Nurse Practitioner [place]

## 2020-12-04 NOTE — Progress Notes (Signed)
OT Cancellation Note  Patient Details Name: Samantha Fitzgerald MRN: [id number] DOB: May 06, 1924   Cancelled Treatment:    Reason Eval/Treat Not Completed: Medical issues which prohibited therapy patient pending thoracentesis with increased need for supplemental O2 at this time. Will check back as able.  Jackelyn Poling OTR/L, [place] Acute Rehabilitation Department Office# [phone] Pager# [phone]    12/04/2020, 12:35 PM

## 2020-12-04 NOTE — Progress Notes (Addendum)
ANTICOAGULATION CONSULT NOTE - Initial Consult  Pharmacy Consult for enoxaparin Indication: DVT  Allergies  Allergen Reactions   Amoxicillin Other (See Comments)    Listed on MAR   Doxycycline Other (See Comments)    Listed on MAR   Levofloxacin Other (See Comments)    Listed on MAR   Morphine And Related Other (See Comments)    Listed on [facility]    Patient Measurements: Weight: 59 kg (130 lb)  Vital Signs: BP: 154/59 (09/20 1330) Pulse Rate: 69 (09/20 1330)  Labs: Recent Labs    12/03/20 1058 12/03/20 2128 12/04/20 0154 12/04/20 0420  HGB 12.0  --   --  10.5*  HCT 37.7  --   --  32.7*  PLT 355  --   --  321  LABPROT 13.5  --   --  13.8  INR 1.0  --   --  1.1  CREATININE 1.00  --   --  0.86  CKTOTAL  --  31*  --   --   TROPONINIHS  --  96* 109*  --     Estimated Creatinine Clearance: 36 mL/min (by C-G formula based on SCr of 0.86 mg/dL).   Medical History: History reviewed. No pertinent past medical history.  Medications:  Scheduled:   albuterol  2 puff Inhalation BID   digoxin  0.125 mg Oral Daily   feeding supplement  237 mL Oral BID BM   flecainide  75 mg Oral BID   Fluticasone-Umeclidin-Vilant  1 puff Oral Daily   furosemide  20 mg Intravenous Once   furosemide  40 mg Intravenous Q12H   sodium chloride flush  3 mL Intravenous Q12H   Infusions:   sodium chloride     cefTRIAXone (ROCEPHIN)  IV 2 g (12/04/20 0601)    Assessment: 87 yo female presenting with hypotension, general weakness, low BP, and bilateral lower extremity edema.  No anticoagulation noted PTA. Found to have acute DVT of the R common femoral vein and SF junction.  Pharmacy consulted to dose enoxaparin for DVT treatment.  Enoxaparin was chosen over heparin to limit the amount of extra fluid patient is receiving due to edema.   D-dimer 2.88, fibrinogen 529.  INR 1.1.  Hemoglobin appears to be near baseline (11-12).  Platelets stable.   Goal of Therapy:  Anti-Xa level 0.6-1 units/ml  4hrs after LMWH dose given as necessary Monitor platelets by anticoagulation protocol: Yes   Plan:  Lovenox 1mg /kg [place] q12 hours (= 60mg  [place] q12 hours) Monitor renal function and adjust dosing as necessary MD discussing risk vs benefit of long-term anticoagulation with patient and family Monitor CBC, s/sx bleeding  Dimple Nanas, PharmD 12/04/2020 2:05 PM

## 2020-12-05 ENCOUNTER — Inpatient Hospital Stay (HOSPITAL_COMMUNITY): Payer: Medicare Other

## 2020-12-05 DIAGNOSIS — N179 Acute kidney failure, unspecified: Secondary | ICD-10-CM | POA: Diagnosis not present

## 2020-12-05 DIAGNOSIS — J449 Chronic obstructive pulmonary disease, unspecified: Secondary | ICD-10-CM | POA: Diagnosis not present

## 2020-12-05 DIAGNOSIS — E8809 Other disorders of plasma-protein metabolism, not elsewhere classified: Secondary | ICD-10-CM

## 2020-12-05 DIAGNOSIS — Z7189 Other specified counseling: Secondary | ICD-10-CM

## 2020-12-05 DIAGNOSIS — N390 Urinary tract infection, site not specified: Secondary | ICD-10-CM | POA: Diagnosis not present

## 2020-12-05 DIAGNOSIS — I5032 Chronic diastolic (congestive) heart failure: Secondary | ICD-10-CM

## 2020-12-05 DIAGNOSIS — L03116 Cellulitis of left lower limb: Secondary | ICD-10-CM | POA: Diagnosis not present

## 2020-12-05 LAB — CBC WITH DIFFERENTIAL/PLATELET
Abs Immature Granulocytes: 0.13 10*3/uL — ABNORMAL HIGH (ref 0.00–0.07)
Basophils Absolute: 0.1 10*3/uL (ref 0.0–0.1)
Basophils Relative: 0 %
Eosinophils Absolute: 0.3 10*3/uL (ref 0.0–0.5)
Eosinophils Relative: 2 %
HCT: 34.4 % — ABNORMAL LOW (ref 36.0–46.0)
Hemoglobin: 11.2 g/dL — ABNORMAL LOW (ref 12.0–15.0)
Immature Granulocytes: 1 %
Lymphocytes Relative: 7 %
Lymphs Abs: 1 10*3/uL (ref 0.7–4.0)
MCH: 28.7 pg (ref 26.0–34.0)
MCHC: 32.6 g/dL (ref 30.0–36.0)
MCV: 88.2 fL (ref 80.0–100.0)
Monocytes Absolute: 0.9 10*3/uL (ref 0.1–1.0)
Monocytes Relative: 6 %
Neutro Abs: 13.3 10*3/uL — ABNORMAL HIGH (ref 1.7–7.7)
Neutrophils Relative %: 84 %
Platelets: 342 10*3/uL (ref 150–400)
RBC: 3.9 MIL/uL (ref 3.87–5.11)
RDW: 13.9 % (ref 11.5–15.5)
WBC: 15.8 10*3/uL — ABNORMAL HIGH (ref 4.0–10.5)
nRBC: 0 % (ref 0.0–0.2)

## 2020-12-05 LAB — COMPREHENSIVE METABOLIC PANEL
ALT: 11 U/L (ref 0–44)
AST: 15 U/L (ref 15–41)
Albumin: 2.6 g/dL — ABNORMAL LOW (ref 3.5–5.0)
Alkaline Phosphatase: 83 U/L (ref 38–126)
Anion gap: 13 (ref 5–15)
BUN: 17 mg/dL (ref 8–23)
CO2: 27 mmol/L (ref 22–32)
Calcium: 7.9 mg/dL — ABNORMAL LOW (ref 8.9–10.3)
Chloride: 95 mmol/L — ABNORMAL LOW (ref 98–111)
Creatinine, Ser: 0.77 mg/dL (ref 0.44–1.00)
GFR, Estimated: >60 mL/min (ref >60)
Glucose, Bld: 63 mg/dL — ABNORMAL LOW (ref 70–99)
Potassium: 4 mmol/L (ref 3.5–5.1)
Sodium: 135 mmol/L (ref 135–145)
Total Bilirubin: 0.9 mg/dL (ref 0.3–1.2)
Total Protein: 5.3 g/dL — ABNORMAL LOW (ref 6.5–8.1)

## 2020-12-05 LAB — BODY FLUID CELL COUNT WITH DIFFERENTIAL
Eos, Fluid: 0 %
Lymphs, Fluid: 80 %
Monocyte-Macrophage-Serous Fluid: 8 % — ABNORMAL LOW (ref 50–90)
Neutrophil Count, Fluid: 12 % (ref 0–25)
Total Nucleated Cell Count, Fluid: 1001 cu mm — ABNORMAL HIGH (ref 0–1000)

## 2020-12-05 LAB — GLUCOSE, PLEURAL OR PERITONEAL FLUID: Glucose, Fluid: 104 mg/dL

## 2020-12-05 LAB — PROTEIN, PLEURAL OR PERITONEAL FLUID: Total protein, fluid: <3 g/dL

## 2020-12-05 MED ORDER — SODIUM CHLORIDE 0.9 % IV SOLN
2.0000 g | Freq: Once | INTRAVENOUS | Status: AC
  Administered 2020-12-05: 2 g via INTRAVENOUS
  Filled 2020-12-05: qty 2

## 2020-12-05 MED ORDER — SODIUM CHLORIDE 0.9 % IV SOLN
2.0000 g | Freq: Two times a day (BID) | INTRAVENOUS | Status: AC
  Administered 2020-12-05 – 2020-12-11 (×13): 2 g via INTRAVENOUS
  Filled 2020-12-05 (×13): qty 2

## 2020-12-05 MED ORDER — ADULT MULTIVITAMIN W/MINERALS CH
1.0000 | ORAL_TABLET | Freq: Every day | ORAL | Status: DC
  Administered 2020-12-06 – 2020-12-14 (×9): 1 via ORAL
  Filled 2020-12-05 (×9): qty 1

## 2020-12-05 MED ORDER — LIDOCAINE HCL 1 % IJ SOLN
INTRAMUSCULAR | Status: AC
  Filled 2020-12-05: qty 20

## 2020-12-05 MED ORDER — VANCOMYCIN HCL 750 MG/150ML IV SOLN
750.0000 mg | INTRAVENOUS | Status: DC
  Administered 2020-12-06 – 2020-12-07 (×2): 750 mg via INTRAVENOUS
  Filled 2020-12-05 (×2): qty 150

## 2020-12-05 MED ORDER — VANCOMYCIN HCL 1250 MG/250ML IV SOLN
1250.0000 mg | INTRAVENOUS | Status: AC
  Administered 2020-12-05: 1250 mg via INTRAVENOUS
  Filled 2020-12-05: qty 250

## 2020-12-05 MED ORDER — ONDANSETRON HCL 4 MG/2ML IJ SOLN
2.0000 mg | Freq: Once | INTRAMUSCULAR | Status: AC
  Administered 2020-12-05: 2 mg via INTRAVENOUS
  Filled 2020-12-05: qty 2

## 2020-12-05 NOTE — Progress Notes (Signed)
OT Cancellation Note  Patient Details Name: Samantha Fitzgerald MRN: [id number] DOB: May 30, 1924   Cancelled Treatment:    Reason Eval/Treat Not Completed: Patient declined, no reason specified. Patient declined today. Nursing reports potential for thoracentesis as well continuing to need 7 L [place] and hard for her to recover. Will f/u as able.  Rahkim Rabalais L Charmane Protzman 12/05/2020, 12:25 PM

## 2020-12-05 NOTE — Plan of Care (Signed)

## 2020-12-05 NOTE — Progress Notes (Signed)
PROGRESS NOTE  Ura Yingling  JQZ:[id number] DOB: Aug 10, 1924 DOA: 12/03/2020 PCP: System, Provider Not In   Brief Narrative: Nylee Barbuto is a 87 y.o. female with medical history significant of COPD 2 L nasal cannula, sick sinus syndrome, Grade 1 diastolic HF, with recurrent large left-sided pleural effusion, recently diagnosed with covid June/July 2022.  Patient presents to the ED from [facility] facility with hypotension general weakness with reportedly acute onset blood pressure noted to be as low as 80/60 with bilateral lower extremity edema.  Assessment & Plan: Active Problems:   Pleural effusion on left   COPD (chronic obstructive pulmonary disease) (HCC)   Sick sinus syndrome (HCC)   Sepsis (HCC)   COVID-19 virus infection   Acute lower UTI   Cellulitis   Chronic diastolic CHF (congestive heart failure) (HCC)   Hypoalbuminemia   AKI (acute kidney injury) ([place])  Severe Sepsis secondary to UTI, POA: Leukocytosis, tachycardia with lactic acidosis = severe sepsis  - Proteus with resistance noted on culture. Change CTX to cefepime.   Rule out cellulitis of left lower extremity - CTX as above. Vanc added for GPC's discussed below.   Positive blood cultures: 2 aerobic bottles out of 4 total bottles collected on 9/19 grew GPC in clusters. MRSE in one of them.  - Add on vancomycin empirically for now, repeat cultures ordered.   Acute on chronic hypoxic respiratory failure: Likely related to symptomatic pleural effusions (R > L) - Hypotension has limited diuresis. Would benefit from thoracentesis which was ordered at admission. Last done 6/30 with 850cc removed. - Titrate oxygen as needed hopefully down to baseline 2L in coming days.  - Restarted lasix, monitor I/O.    Lower extremity edema Pleural effusion Hypoalbuminemia - Diurese as able, optimize nutritional status.     Acute DVT of the right common femoral vein and SF junction:  - Restart therapeutic  anticoagulation 9/22 given red-tinged nature of effusion.    Chronic diastolic CHF:  - Resume diuretic and monitor closely   Sick sinus syndrome: Continue home antiarrhythmics, digoxin (level 1.4) and flecainide.    COPD: Not in exacerbation.  - Continue inhaled therapies.    History of covid-19 infection: Treated with remdesivir and steroids. No pulmonary symptoms, infiltrate on CXR. CT is 42 not consistent with active infection.  - No isolation/Tx required.    DVT prophylaxis: Lovenox 1mg /kg q12h Code Status: DNR Family Communication: None at bedside Disposition Plan:  Status is: Inpatient  Remains inpatient appropriate because:Ongoing diagnostic testing needed not appropriate for outpatient work up and Inpatient level of care appropriate due to severity of illness  Dispo: The patient is from: ALF              Anticipated d/c is to: ALF              Patient currently is not medically stable to d/c.   Consultants:  IR  Procedures:  Thoracentesis  Antimicrobials: Ceftriaxone 9/19 >> Vancomycin 9/21 >>  Subjective: Feels it remains constantly difficult to breathe, not significantly improved over past 24 hours. No chest pain or bleeding reported.   Objective: Vitals:   12/05/20 1426 12/05/20 1503 12/05/20 1516 12/05/20 1551  BP: (!) 139/55 (!) 171/73 (!) 165/63 139/61  Pulse: 94 (!) 113 (!) 108 94  Resp:      Temp: 98 F (36.7 C)   98.3 F (36.8 C)  TempSrc: Oral   Oral  SpO2: 100% 100% 100% 100%  Weight:  Height:        Intake/Output Summary (Last 24 hours) at 12/05/2020 1819 Last data filed at 12/05/2020 0600 Gross per 24 hour  Intake 398.18 ml  Output 800 ml  Net -401.82 ml   Filed Weights   12/04/20 1403 12/05/20 1342  Weight: 59 kg 59 kg   Gen: 87 y.o. female in no distress Pulm: Tachypneic with mild labor with longer sentences, decreased on L > R without crackles.  CV: Regular rate and rhythm. No murmur, rub, or gallop. No JVD, 2+ pedal  edema. GI: Abdomen soft, non-tender, non-distended, with normoactive bowel sounds. No organomegaly or masses felt. Ext: Warm, no deformities Skin: Lower legs with erythema without exudate Neuro: Alert and oriented. No focal neurological deficits. Psych: Judgement and insight appear fair Mood & affect appropriate.   Data Reviewed: I have personally reviewed following labs and imaging studies  CBC: Recent Labs  Lab 12/03/20 1058 12/04/20 0420 12/05/20 0335  WBC 24.2* 16.7* 15.8*  NEUTROABS 22.2* 14.4* 13.3*  HGB 12.0 10.5* 11.2*  HCT 37.7 32.7* 34.4*  MCV 90.8 89.8 88.2  PLT 355 321 466   Basic Metabolic Panel: Recent Labs  Lab 12/03/20 1058 12/03/20 2128 12/04/20 0420 12/05/20 0335  NA 135  --  138 135  K 4.0  --  4.2 4.0  CL 93*  --  98 95*  CO2 30  --  33* 27  GLUCOSE 134*  --  83 63*  BUN 17  --  18 17  CREATININE 1.00  --  0.86 0.77  CALCIUM 8.6*  --  8.6* 7.9*  MG  --  1.8 1.8  --   PHOS  --  3.8 3.8  --    GFR: Estimated Creatinine Clearance: 34 mL/min (by C-G formula based on SCr of 0.77 mg/dL). Liver Function Tests: Recent Labs  Lab 12/03/20 1058 12/04/20 0420 12/05/20 0335  AST 20 20 15   ALT 14 12 11   ALKPHOS 94 78 83  BILITOT 0.7 0.6 0.9  PROT 6.3* 5.7* 5.3*  ALBUMIN 2.9* 2.7* 2.6*   No results for input(s): LIPASE, AMYLASE in the last 168 hours. No results for input(s): AMMONIA in the last 168 hours. Coagulation Profile: Recent Labs  Lab 12/03/20 1058 12/04/20 0420  INR 1.0 1.1   Cardiac Enzymes: Recent Labs  Lab 12/03/20 2128  CKTOTAL 31*   BNP (last 3 results) No results for input(s): PROBNP in the last 8760 hours. HbA1C: No results for input(s): HGBA1C in the last 72 hours. CBG: No results for input(s): GLUCAP in the last 168 hours. Lipid Profile: No results for input(s): CHOL, HDL, LDLCALC, TRIG, CHOLHDL, LDLDIRECT in the last 72 hours. Thyroid Function Tests: Recent Labs    12/04/20 0420  TSH 2.915   Anemia  Panel: Recent Labs    12/04/20 0420  FERRITIN 112  116   Urine analysis:    Component Value Date/Time   COLORURINE STRAW (A) 12/03/2020 1458   APPEARANCEUR TURBID (A) 12/03/2020 1458   LABSPEC  12/03/2020 1458    TEST NOT REPORTED DUE TO COLOR INTERFERENCE OF URINE PIGMENT   PHURINE  12/03/2020 1458    TEST NOT REPORTED DUE TO COLOR INTERFERENCE OF URINE PIGMENT   GLUCOSEU (A) 12/03/2020 1458    TEST NOT REPORTED DUE TO COLOR INTERFERENCE OF URINE PIGMENT   HGBUR (A) 12/03/2020 1458    TEST NOT REPORTED DUE TO COLOR INTERFERENCE OF URINE PIGMENT   BILIRUBINUR (A) 12/03/2020 1458    TEST NOT  REPORTED DUE TO COLOR INTERFERENCE OF URINE PIGMENT   KETONESUR (A) 12/03/2020 1458    TEST NOT REPORTED DUE TO COLOR INTERFERENCE OF URINE PIGMENT   PROTEINUR (A) 12/03/2020 1458    TEST NOT REPORTED DUE TO COLOR INTERFERENCE OF URINE PIGMENT   NITRITE (A) 12/03/2020 1458    TEST NOT REPORTED DUE TO COLOR INTERFERENCE OF URINE PIGMENT   LEUKOCYTESUR (A) 12/03/2020 1458    TEST NOT REPORTED DUE TO COLOR INTERFERENCE OF URINE PIGMENT   Recent Results (from the past 240 hour(s))  Culture, blood (Routine x 2)     Status: Abnormal (Preliminary result)   Collection Time: 12/03/20 10:54 AM   Specimen: BLOOD LEFT FOREARM  Result Value Ref Range Status   Specimen Description   Final    BLOOD LEFT FOREARM Performed at [facility], [place] [address] [place], [place] [address]    Special Requests   Final    BOTTLES DRAWN AEROBIC AND ANAEROBIC Blood Culture adequate volume Performed at [place] [address], [place], [place] [address]    Culture  Setup Time   Final    GRAM POSITIVE COCCI IN CLUSTERS AEROBIC BOTTLE ONLY Organism ID to follow CRITICAL RESULT CALLED TO, READ BACK BY AND VERIFIED WITH: A TUCKER PHARMD 1652 12/04/20 A BROWNING    Culture (A)  Final    STAPHYLOCOCCUS EPIDERMIDIS THE SIGNIFICANCE OF ISOLATING THIS ORGANISM FROM A SINGLE  SET OF BLOOD CULTURES WHEN MULTIPLE SETS ARE DRAWN IS UNCERTAIN. PLEASE NOTIFY THE MICROBIOLOGY DEPARTMENT WITHIN ONE WEEK IF SPECIATION AND SENSITIVITIES ARE REQUIRED. Performed at [facility], [place] [address], [place], [place] [address]    Report Status PENDING  Incomplete  Blood Culture ID Panel (Reflexed)     Status: Abnormal   Collection Time: 12/03/20 10:54 AM  Result Value Ref Range Status   Enterococcus faecalis NOT DETECTED NOT DETECTED Final   Enterococcus Faecium NOT DETECTED NOT DETECTED Final   Listeria monocytogenes NOT DETECTED NOT DETECTED Final   Staphylococcus species DETECTED (A) NOT DETECTED Final    Comment: CRITICAL RESULT CALLED TO, READ BACK BY AND VERIFIED WITH: A TUCKER PHARMD 1652 12/04/20 A BROWNING    Staphylococcus aureus (BCID) NOT DETECTED NOT DETECTED Final   Staphylococcus epidermidis DETECTED (A) NOT DETECTED Final    Comment: Methicillin (oxacillin) resistant coagulase negative staphylococcus. Possible blood culture contaminant (unless isolated from more than one blood culture draw or clinical case suggests pathogenicity). No antibiotic treatment is indicated for blood  culture contaminants. CRITICAL RESULT CALLED TO, READ BACK BY AND VERIFIED WITH: A TUCKER PHARMD 5809 12/04/20 A BROWNING    Staphylococcus lugdunensis NOT DETECTED NOT DETECTED Final   Streptococcus species NOT DETECTED NOT DETECTED Final   Streptococcus agalactiae NOT DETECTED NOT DETECTED Final   Streptococcus pneumoniae NOT DETECTED NOT DETECTED Final   Streptococcus pyogenes NOT DETECTED NOT DETECTED Final   A.calcoaceticus-baumannii NOT DETECTED NOT DETECTED Final   Bacteroides fragilis NOT DETECTED NOT DETECTED Final   Enterobacterales NOT DETECTED NOT DETECTED Final   Enterobacter cloacae complex NOT DETECTED NOT DETECTED Final   Escherichia coli NOT DETECTED NOT DETECTED Final   Klebsiella aerogenes NOT DETECTED NOT DETECTED Final   Klebsiella oxytoca NOT DETECTED NOT  DETECTED Final   Klebsiella pneumoniae NOT DETECTED NOT DETECTED Final   Proteus species NOT DETECTED NOT DETECTED Final   Salmonella species NOT DETECTED NOT DETECTED Final   Serratia marcescens NOT DETECTED NOT DETECTED Final   Haemophilus  influenzae NOT DETECTED NOT DETECTED Final   Neisseria meningitidis NOT DETECTED NOT DETECTED Final   Pseudomonas aeruginosa NOT DETECTED NOT DETECTED Final   Stenotrophomonas maltophilia NOT DETECTED NOT DETECTED Final   Candida albicans NOT DETECTED NOT DETECTED Final   Candida auris NOT DETECTED NOT DETECTED Final   Candida glabrata NOT DETECTED NOT DETECTED Final   Candida krusei NOT DETECTED NOT DETECTED Final   Candida parapsilosis NOT DETECTED NOT DETECTED Final   Candida tropicalis NOT DETECTED NOT DETECTED Final   Cryptococcus neoformans/gattii NOT DETECTED NOT DETECTED Final   Methicillin resistance mecA/C DETECTED (A) NOT DETECTED Final    Comment: CRITICAL RESULT CALLED TO, READ BACK BY AND VERIFIED WITHAustin Miles PHARMD 1652 12/04/20 A BROWNING Performed at [facility], [address], [place], [place] [address]   Culture, blood (Routine x 2)     Status: None (Preliminary result)   Collection Time: 12/03/20 10:59 AM   Specimen: BLOOD  Result Value Ref Range Status   Specimen Description   Final    BLOOD RIGHT ANTECUBITAL Performed at [facility] [address], [place], [place] [address]    Special Requests   Final    BOTTLES DRAWN AEROBIC AND ANAEROBIC Blood Culture adequate volume Performed at [facility] [address] [place], [place] [address]    Culture  Setup Time   Final    GRAM POSITIVE COCCI IN CLUSTERS AEROBIC BOTTLE ONLY CRITICAL VALUE NOTED.  VALUE IS CONSISTENT WITH PREVIOUSLY REPORTED AND CALLED VALUE.    Culture   Final    NO GROWTH 2 DAYS Performed at [facility], [place] [address], [place], [place] [address]    Report Status PENDING  Incomplete  Resp  Panel by RT-PCR (Flu A&B, Covid) Nasopharyngeal Swab     Status: Abnormal   Collection Time: 12/03/20 12:03 PM   Specimen: Nasopharyngeal Swab; Nasopharyngeal(NP) swabs in vial transport medium  Result Value Ref Range Status   SARS Coronavirus 2 by RT PCR POSITIVE (A) NEGATIVE Final    Comment: RESULT CALLED TO, READ BACK BY AND VERIFIED WITH: JACOBS,D. RN @1310  ON 12/03/2020 BY COHEN,K (NOTE) SARS-CoV-2 target nucleic acids are DETECTED.  The SARS-CoV-2 RNA is generally detectable in upper respiratory specimens during the acute phase of infection. Positive results are indicative of the presence of the identified virus, but do not rule out bacterial infection or co-infection with other pathogens not detected by the test. Clinical correlation with patient history and other diagnostic information is necessary to determine patient infection status. The expected result is Negative.  Fact Sheet for Patients: EntrepreneurPulse.com.au  Fact Sheet for Healthcare Providers: IncredibleEmployment.be  This test is not yet approved or cleared by the [place] FDA and  has been authorized for detection and/or diagnosis of SARS-CoV-2 by FDA under an Emergency Use Authorization (EUA).  This EUA will remain in effect (meaning this t est can be used) for the duration of  the COVID-19 declaration under Section 564(b)(1) of the Act, 21 U.S.C. section 360bbb-3(b)(1), unless the authorization is terminated or revoked sooner.     Influenza A by PCR NEGATIVE NEGATIVE Final   Influenza B by PCR NEGATIVE NEGATIVE Final    Comment: (NOTE) The Xpert Xpress SARS-CoV-2/FLU/RSV plus assay is intended as an aid in the diagnosis of influenza from Nasopharyngeal swab specimens and should not be used as a sole basis for treatment. Nasal washings and aspirates are unacceptable for Xpert Xpress SARS-CoV-2/FLU/RSV testing.  Fact Sheet for  Patients: EntrepreneurPulse.com.au  Fact Sheet for Healthcare Providers: IncredibleEmployment.be  This test is not yet approved or cleared by the [place] FDA and has been authorized for detection and/or diagnosis of SARS-CoV-2 by FDA under an Emergency Use Authorization (EUA). This EUA will remain in effect (meaning this test can be used) for the duration of the COVID-19 declaration under Section 564(b)(1) of the Act, 21 U.S.C. section 360bbb-3(b)(1), unless the authorization is terminated or revoked.  Performed at [facility], [place] [address], [place], [place] [address]   Urine Culture     Status: Abnormal (Preliminary result)   Collection Time: 12/03/20  2:59 PM   Specimen: Urine, Clean Catch  Result Value Ref Range Status   Specimen Description   Final    URINE, CLEAN CATCH Performed at [facility], [place] [address] [place], [place] [address]    Special Requests   Final    NONE Performed at [facility], [place] [address], [place], [place] [address]    Culture (A)  Final    >=100,000 COLONIES/mL PROTEUS VULGARIS CULTURE REINCUBATED FOR BETTER GROWTH Performed at [facility], [place] [address] [place], [place] [address]    Report Status PENDING  Incomplete   Organism ID, Bacteria PROTEUS VULGARIS (A)  Final      Susceptibility   Proteus vulgaris - MIC*    AMPICILLIN >=32 RESISTANT Resistant     CEFAZOLIN >=64 RESISTANT Resistant     CEFEPIME <=0.12 SENSITIVE Sensitive     CIPROFLOXACIN <=0.25 SENSITIVE Sensitive     GENTAMICIN <=1 SENSITIVE Sensitive     IMIPENEM 2 SENSITIVE Sensitive     NITROFURANTOIN 128 RESISTANT Resistant     TRIMETH/SULFA <=20 SENSITIVE Sensitive     AMPICILLIN/SULBACTAM 16 INTERMEDIATE Intermediate     PIP/TAZO <=4 SENSITIVE Sensitive     * >=100,000 COLONIES/mL PROTEUS VULGARIS  MRSA Next Gen by PCR, Nasal     Status: None   Collection Time:  12/04/20  4:06 PM   Specimen: Nasal Mucosa; Nasal Swab  Result Value Ref Range Status   MRSA by PCR Next Gen NOT DETECTED NOT DETECTED Final    Comment: (NOTE) The GeneXpert MRSA Assay (FDA approved for NASAL specimens only), is one component of a comprehensive MRSA colonization surveillance program. It is not intended to diagnose MRSA infection nor to guide or monitor treatment for MRSA infections. Test performance is not FDA approved in patients less than 73 years old. Performed at [facility], [place] [address] [place], [place] [address]   Culture, blood (routine x 2)     Status: None (Preliminary result)   Collection Time: 12/05/20  7:38 AM   Specimen: BLOOD RIGHT HAND  Result Value Ref Range Status   Specimen Description   Final    BLOOD RIGHT HAND Performed at [facility] [address], [place], [place] [address]    Special Requests   Final    BOTTLES DRAWN AEROBIC ONLY Blood Culture adequate volume Performed at [facility] [address] [place], [place] [address]    Culture   Final    NO GROWTH < 12 HOURS Performed at [facility] [address] [place],  [address]    Report Status PENDING  Incomplete  Culture, blood (routine x 2)     Status: None (Preliminary result)   Collection Time: 12/05/20  7:38 AM   Specimen: BLOOD  Result  Value Ref Range Status   Specimen Description   Final    BLOOD RIGHT ANTECUBITAL Performed at [facility] [address], [place], [place] [address]    Special Requests   Final    BOTTLES DRAWN AEROBIC ONLY Blood Culture adequate volume Performed at [facility] [address] [place], [place] [address]    Culture   Final    NO GROWTH < 12 HOURS Performed at [facility] [address] [place],  [address]    Report Status PENDING  Incomplete      Radiology Studies: DG Chest 1 View  Result Date:  12/05/2020 CLINICAL DATA:  Post thoracentesis. EXAM: CHEST  1 VIEW COMPARISON:  Chest x-ray 12/03/2020. FINDINGS: There is a small left pleural effusion which is decreased from the prior examination. There are findings suspicious for tiny left apical pneumothorax. There is no mediastinal shift. Small right pleural effusion is unchanged from the prior examination. The cardiomediastinal silhouette is stable. Surgical changes overlie the right hilar region. There are atherosclerotic calcifications of the aorta. Osseous structures are within normal limits. IMPRESSION: 1. Tiny left apical pneumothorax. 2. Small left pleural effusion has decreased. 3. Small right pleural effusion is unchanged. 4.  Aortic Atherosclerosis (ICD10-I70.0). Electronically Signed   By: Ronney Asters M.D.   On: 12/05/2020 15:48   DG CHEST PORT 1 VIEW  Result Date: 12/03/2020 CLINICAL DATA:  Dyspnea EXAM: PORTABLE CHEST 1 VIEW COMPARISON:  12/03/2020 FINDINGS: Cardiac enlargement. Bilateral pleural effusions, greater on the left, with left greater than right perihilar infiltration. This could represent edema or pneumonia. Surgical clips in the right hilum with hilar prominence, possibly lymphadenopathy. Old right rib fractures. Degenerative changes in the spine and shoulders. Calcification of the aorta. IMPRESSION: Bilateral pleural effusions and perihilar infiltrates, greater on the left. Postoperative changes and nodular prominence in the left hilum. Electronically Signed   By: Lucienne Capers M.D.   On: 12/03/2020 21:59   VAS [place] LOWER EXTREMITY VENOUS (DVT)  Result Date: 12/04/2020  Lower Venous DVT Study Patient Name:  ANYSSA SHARPLESS Adorno  Date of Exam:   12/04/2020 Medical Rec #: [id number]               Accession #:    [id number] Date of Birth: 10/29/1924                Patient Gender: F Patient Age:   13 years Exam Location:  [facility] Procedure:      VAS [place] LOWER EXTREMITY VENOUS (DVT) Referring Phys: Nyoka Lint DOUTOVA  --------------------------------------------------------------------------------  Indications: Edema. Other Indications: Covid+. Limitations: Poor ultrasound/tissue interface and edema, patient immobility. Comparison Study: No previous exams Performing Technologist: Jody Hill RVT, RDMS  Examination Guidelines: A complete evaluation includes B-mode imaging, spectral Doppler, color Doppler, and power Doppler as needed of all accessible portions of each vessel. Bilateral testing is considered an integral part of a complete examination. Limited examinations for reoccurring indications may be performed as noted. The reflux portion of the exam is performed with the patient in reverse Trendelenburg.  +---------+---------------+---------+-----------+----------+-------------------+ RIGHT    CompressibilityPhasicitySpontaneityPropertiesThrombus Aging      +---------+---------------+---------+-----------+----------+-------------------+ CFV      Partial        Yes      Yes                  Acute               +---------+---------------+---------+-----------+----------+-------------------+ SFJ  Partial                                      Acute               +---------+---------------+---------+-----------+----------+-------------------+ FV Prox  Full           Yes      Yes                                      +---------+---------------+---------+-----------+----------+-------------------+ FV Mid   Full           Yes      Yes                                      +---------+---------------+---------+-----------+----------+-------------------+ FV DistalFull           Yes      Yes                                      +---------+---------------+---------+-----------+----------+-------------------+ PFV      Partial        Yes      Yes                  Age Indeterminate   +---------+---------------+---------+-----------+----------+-------------------+ POP      Full           Yes       Yes                                      +---------+---------------+---------+-----------+----------+-------------------+ PTV      Full                                         Not well visualized +---------+---------------+---------+-----------+----------+-------------------+ PERO     Full                                         Not well visualized +---------+---------------+---------+-----------+----------+-------------------+ GSV      Partial        Yes      Yes                  GSV origin -                                                              partial acute       +---------+---------------+---------+-----------+----------+-------------------+   +---------+---------------+---------+-----------+----------+--------------+ LEFT     CompressibilityPhasicitySpontaneityPropertiesThrombus Aging +---------+---------------+---------+-----------+----------+--------------+ CFV      Full           Yes      Yes                                 +---------+---------------+---------+-----------+----------+--------------+  SFJ      Full                                                        +---------+---------------+---------+-----------+----------+--------------+ FV Prox  Full           Yes      Yes                                 +---------+---------------+---------+-----------+----------+--------------+ FV Mid   Full           Yes      Yes                                 +---------+---------------+---------+-----------+----------+--------------+ FV DistalFull           Yes      Yes                                 +---------+---------------+---------+-----------+----------+--------------+ PFV      Full                                                        +---------+---------------+---------+-----------+----------+--------------+ POP      Full           Yes      Yes                                  +---------+---------------+---------+-----------+----------+--------------+ PTV      Full                                                        +---------+---------------+---------+-----------+----------+--------------+ PERO                                                  Not visualized +---------+---------------+---------+-----------+----------+--------------+   Left Technical Findings: Not visualized segments include peroneal veins.   Summary: BILATERAL: -No evidence of popliteal cyst, bilaterally. RIGHT: - Findings consistent with acute deep vein thrombosis involving the right common femoral vein, and SF junction. - Findings consistent with acute superficial vein thrombosis involving the right great saphenous vein. - Findings consistent with age indeterminate deep vein thrombosis involving the right proximal profunda vein. - Common femoral vein obstruction doesn't appear to extend above inguinal ligament.  LEFT: - Portions of this examination were limited- see technologist comments above. - There is no evidence of deep vein thrombosis in the lower extremity. - There is no evidence of superficial venous thrombosis.  *See table(s) above for measurements and observations. Electronically signed by Deitra Mayo MD on 12/04/2020 at 4:01:08 PM.    Final  US THORACENTESIS ASP PLEURAL SPACE W/IMG GUIDE  Result Date: 12/05/2020 INDICATION: Pleural effusion. EXAM: ULTRASOUND GUIDED DIAGNOSITIC AND THERAPEUTIC THORACENTESIS MEDICATIONS: None. COMPLICATIONS: None immediate. PROCEDURE: An ultrasound guided thoracentesis was thoroughly discussed with the patient and questions answered. The benefits, risks, alternatives and complications were also discussed. The patient understands and wishes to proceed with the procedure. Written consent was obtained. Ultrasound was performed to localize and mark an adequate pocket of fluid in the left chest. The area was then prepped and draped in the normal sterile  fashion. 1% Lidocaine was used for local anesthesia. Under ultrasound guidance a 6 Fr Safe-T-Centesis catheter was introduced. Thoracentesis was performed. The catheter was removed and a dressing applied. FINDINGS: A total of approximately 800 of red-tinged fluid was removed. Samples were sent to the laboratory as requested by the clinical team. IMPRESSION: Successful ultrasound guided diagnostic and therapeutic thoracentesis yielding 800cc of pleural fluid. Electronically Signed   By: Ruthann Cancer M.D.   On: 12/05/2020 15:32    Scheduled Meds:  albuterol  2 puff Inhalation BID   Chlorhexidine Gluconate Cloth  6 each Topical Q0600   digoxin  0.125 mg Oral Daily   enoxaparin (LOVENOX) injection  1 mg/kg Subcutaneous Q12H   feeding supplement  237 mL Oral BID BM   flecainide  75 mg Oral BID   fluticasone furoate-vilanterol  1 puff Inhalation Daily   And   umeclidinium bromide  1 puff Inhalation Daily   furosemide  40 mg Intravenous Q12H   multivitamin with minerals  1 tablet Oral Daily   sodium chloride flush  3 mL Intravenous Q12H   Continuous Infusions:  sodium chloride     ceFEPime (MAXIPIME) IV     [START ON 12/06/2020] vancomycin       LOS: 2 days   Time spent: 25 minutes.  Patrecia Pour, MD [facility] [email or web address] 12/05/2020, 6:19 PM

## 2020-12-05 NOTE — Plan of Care (Signed)
  Problem: Education: Goal: Knowledge of General Education information will improve Description Including pain rating scale, medication(s)/side effects and non-pharmacologic comfort measures Outcome: Progressing   Problem: Health Behavior/Discharge Planning: Goal: Ability to manage health-related needs will improve Outcome: Progressing   

## 2020-12-05 NOTE — Consult Note (Signed)
Consultation Note Date: 12/05/2020   Patient Name: Samantha Fitzgerald  DOB: December 18, 1924  MRN: [id number]  Age / Sex: 87 y.o., female  PCP: System, Provider Not In Referring Physician: Patrecia Pour, MD  Reason for Consultation:   HPI/Patient Profile: 87 y.o. female  with past medical history of COPD, grade 1 diastolic heart failure, SSS,  recurrent pleural effusions, Covid infection in June of this year admitted on 12/03/2020 with urosepsis, Covid positivity (low CT not likely to be an active infection), bilateral pleural effusions (presumed d/t hypoalbuminemia), lower extremity DVT.  Palliative consulted for Samantha Fitzgerald.   Primary Decision Maker PATIENT and her daughterFreda Fitzgerald  Discussion: Patient was sitting up awake and alert. Eating her lunch. Her daughter, Samantha Fitzgerald, was at bedside.  Samantha Fitzgerald was living at [facility] in assisted living prior to admission. She was walking with a walker. She has had a decreased appetite over the last few weeks, and her activity has decreased over the last few weeks.  Ice cream cheers her up.   We discussed her values and goals of care.  Her desire is to continue living life to the fullest. She would not want to live a life bedbound and struggling to breathe.  I attempted to discuss further advanced care planning, however, Samantha Fitzgerald was clear she did not wish to further discuss end of life or advanced care planning. She noted that Samantha Fitzgerald is her power of attorney and that Samantha Fitzgerald knows all of her wishes. She stated that for now she only wants to talk about getting better. She is looking forward to the thoracentesis and is hopeful that it will allow her to participate in therapy more.     SUMMARY OF RECOMMENDATIONS -Continue current plan- she is DNR -Treat what is treatable- patient and family are hopeful for thoracentesis -She wishes to return to [place] -I will reach out to her  daughter tomorrow separately in efforts to further document goals of care- for now goals are treat what is treatable and discharge home to [place] -Will continue to follow- she is at risk for decompensation    Code Status/Advance Care Planning: DNR   Prognosis:   Unable to determine  Discharge Planning: To Be Determined  Primary Diagnoses: Present on Admission:  Sepsis ([place])  Sick sinus syndrome (H[facility])  Pleural effusion on left  COPD (chronic obstructive pulmonary disease) (H[facility])  COVID-19 virus infection  Acute lower UTI  Cellulitis  Chronic diastolic CHF (congestive heart failure) (HCC)  Hypoalbuminemia  AKI (acute kidney injury) ([place])   Review of Systems  Physical Exam  Vital Signs: BP (!) 157/62 (BP Location: Right Arm)   Pulse (!) 103   Temp 98.8 F (37.1 C)   Resp 20   Ht 5\' 2"  (1.575 m)   Wt 59 kg   SpO2 91%   BMI 23.78 kg/m  Pain Scale: 0-10   Pain Score: 0-No pain   SpO2: SpO2: 91 % O2 Device:SpO2: 91 % O2 Flow Rate: .O2 Flow Rate (L/min): 7 L/min  IO: Intake/output summary:  Intake/Output Summary (Last 24 hours) at 12/05/2020 1339 Last data filed at 12/05/2020 0600 Gross per 24 hour  Intake 398.18 ml  Output 1600 ml  Net -1201.82 ml    LBM:   Baseline Weight: Weight: 59 kg Most recent weight: Weight: 59 kg     Palliative Assessment/Data: 50%       Thank you for this consult. Palliative medicine will continue to follow and assist as needed.   Time In: 1301 Time Out: 1355 Time Total: 54 minutes Greater than 50%  of this time was spent counseling and coordinating care related to the above assessment and plan.  Signed by: Mariana Kaufman, AGNP-C Palliative Medicine    Please contact Palliative Medicine Team phone at [phone] for questions and concerns.  For individual provider: See Shea Evans

## 2020-12-05 NOTE — Progress Notes (Signed)
Pharmacy Antibiotic Note  Samantha Fitzgerald is a 87 y.o. female admitted on 12/03/2020 with sepsis. Patient placed on Ceftriaxone by admitting physician, source of infection UTI vs. possible cellulitis. Aerobic bottle of both sets of blood cultures from 9/19 growing gram positive cocci in clusters. Repeat blood cultures ordered. Urine culture growing Proteus vulgaris. Pharmacy has been consulted today for Vancomycin and Cefepime dosing.  Plan: Vancomycin 1250mg  IV x 1, then 750mg  IV q24h Vancomycin levels at steady state, as indicated Cefepime 2g IV q12h Monitor renal function, cultures, clinical course, and for ability to de-escalate antibiotic therapy   Height: 5\' 2"  (157.5 cm) Weight: 59 kg (130 lb) IBW/kg (Calculated) : 50.1  Temp (24hrs), Avg:98.1 F (36.7 C), Min:97.6 F (36.4 C), Max:98.8 F (37.1 C)  Recent Labs  Lab 12/03/20 1058 12/03/20 1327 12/04/20 0420 12/05/20 0335  WBC 24.2*  --  16.7* 15.8*  CREATININE 1.00  --  0.86 0.77  LATICACIDVEN 2.2* 2.0*  --   --     Estimated Creatinine Clearance: 34 mL/min (by C-G formula based on SCr of 0.77 mg/dL).    Allergies  Allergen Reactions   Amoxicillin Other (See Comments)    Listed on MAR   Doxycycline Other (See Comments)    Listed on MAR   Levofloxacin Other (See Comments)    Listed on MAR   Morphine And Related Other (See Comments)    Listed on MAR    Antimicrobials this admission: 9/19 Vancomycin x 1, resumed 9/21 >> 9/19 Cefepime x 1, resumed 9/21 >> 9/19 Metronidazole x 1 9/20 Ceftriaxone >> 9/21  Dose adjustments this admission: --  Microbiology results: 9/19 BCx: aerobic bottle of both sets with GPC in clusters. BCID = Staph epi (mecA/C detected) 9/19 UCx: > 100K Proteus vulgaris, cx reincubated for better growth   9/20 MRSA PCR: negative  9/21 BCx: NGTD  Thank you for allowing pharmacy to be a part of this patient's care.   Lindell Spar, PharmD, BCPS Clinical Pharmacist  12/05/2020 10:25  AM

## 2020-12-05 NOTE — Procedures (Signed)
PROCEDURE SUMMARY:  Successful US guided diagnostic and therapeutic thoracentesis. Yielded 800cc of red-tinged fluid. Pt tolerated procedure well. No immediate complications.  Specimen was sent for labs. CXR ordered.  EBL negligible  Amin Fornwalt PA-C 12/05/2020 3:27 PM

## 2020-12-05 NOTE — Progress Notes (Signed)
PT Cancellation Note  Patient Details Name: Samantha Fitzgerald MRN: [id number] DOB: [birth year]   Cancelled Treatment:    Reason Eval/Treat Not Completed: Medical issues which prohibited therapy. Per chart review, nursing reports potential for thoracentesis as well continuing to need 7 L [place] and hard for pt to recover, will hold PT eval until appropriate and pt willing.   Talbot Grumbling PT, DPT 12/05/20, 1:49 PM

## 2020-12-05 NOTE — TOC Progression Note (Signed)
Transition of Care [facility]) - Progression Note    Patient Details  Name: Samantha Fitzgerald MRN: [id number] Date of Birth: [birth year]  [facility] CM/SW Contact  Purcell Mouton, RN Phone Number: 12/05/2020, 12:30 PM  Clinical Narrative:    Pt is from [place], plan to return at discharge.    Expected Discharge Plan: Assisted Living Barriers to Discharge: No Barriers Identified  Expected Discharge Plan and Services Expected Discharge Plan: Assisted Living       Living arrangements for the past 2 months: Assisted Living Facility                                       Social Determinants of Health (SDOH) Interventions    Readmission Risk Interventions No flowsheet data found.

## 2020-12-05 NOTE — Progress Notes (Signed)
Initial Nutrition Assessment  DOCUMENTATION CODES:   Not applicable  INTERVENTION:  - continue Ensure Plus BID, each supplement provides 350 kcal and 13 grams of protein - will order Magic Cup BID with meals, each supplement provides 290 kcal and 9 grams of protein. - will order 1 tablet multivitamin with minerals/day. - consider diet liberalization once edema is well controlled.    NUTRITION DIAGNOSIS:   Increased nutrient needs related to acute illness, catabolic illness (SMOLM-78 infection) as evidenced by estimated needs  GOAL:   Patient will meet greater than or equal to 90% of their needs  MONITOR:   PO intake, Supplement acceptance, Labs, Weight trends, I & O's  REASON FOR ASSESSMENT:   Consult Assessment of nutrition requirement/status  ASSESSMENT:   87 y.o. female with medical history of COPD (2L O2 at baseline), sick sinus syndrome, CHF, large L-sided pleural effusion, COVID-19 in 08/2020. She presented to the ED due to generalized weakness and increased BLE edema. Patient was unable to provide information/history in the ED. She underwent a thoracentesis on 09/13/20 with 850 ml removed. She received the flu shot 4 days PTA.  No intakes documented since admission. Ensure Plus ordered BID and she has accepted 1 of the 2 bottles offered to her.   She is noted to have memory impairment and be a/o to person and place only.   She has not been seen by a [place] RD at any time in the past.   Weight yesterday was documented as 130 lb, which appears to be a stated weight. Weight on 09/12/20 documented as the exact same. She weighed 124 lb at [facility] on 04/29/19.   No information documented in the edema section of flow sheet. She is noted to be +1 L since admission.  Per notes: - sepsis/SIRS - BLE edema and possible cellulitis - recurrent L-sided pleural effusion pending thoracentesis - COVID-19 infection   Labs reviewed; Cl: 95 mmol/l, Ca: 7.9  mg/dl. Medications reviewed; 40 mg IV lasix BID.     NUTRITION - FOCUSED PHYSICAL EXAM:  Unable to complete at this time.   Diet Order:   Diet Order             Diet Heart Room service appropriate? Yes; Fluid consistency: Thin  Diet effective now                   EDUCATION NEEDS:   Not appropriate for education at this time  Skin:  Skin Assessment: Reviewed RN Assessment  Last BM:  PTA/unknown  Height:   Ht Readings from Last 1 Encounters:  12/04/20 5\' 2"  (1.575 m)    Weight:   Wt Readings from Last 1 Encounters:  12/04/20 59 kg     Estimated Nutritional Needs:  Kcal:  1500-1700 kcal Protein:  80-95 grams Fluid:  >/= 2 L/day      Jarome Matin, MS, RD, LDN, CNSC Inpatient Clinical Dietitian RD pager # available in AMION  After hours/weekend pager # available in [facility]

## 2020-12-06 DIAGNOSIS — E8809 Other disorders of plasma-protein metabolism, not elsewhere classified: Secondary | ICD-10-CM | POA: Diagnosis not present

## 2020-12-06 DIAGNOSIS — R7881 Bacteremia: Secondary | ICD-10-CM | POA: Diagnosis not present

## 2020-12-06 DIAGNOSIS — J449 Chronic obstructive pulmonary disease, unspecified: Secondary | ICD-10-CM | POA: Diagnosis not present

## 2020-12-06 DIAGNOSIS — Z515 Encounter for palliative care: Secondary | ICD-10-CM

## 2020-12-06 DIAGNOSIS — N179 Acute kidney failure, unspecified: Secondary | ICD-10-CM | POA: Diagnosis not present

## 2020-12-06 DIAGNOSIS — L03116 Cellulitis of left lower limb: Secondary | ICD-10-CM

## 2020-12-06 DIAGNOSIS — R630 Anorexia: Secondary | ICD-10-CM

## 2020-12-06 DIAGNOSIS — N390 Urinary tract infection, site not specified: Secondary | ICD-10-CM | POA: Diagnosis not present

## 2020-12-06 DIAGNOSIS — I5032 Chronic diastolic (congestive) heart failure: Secondary | ICD-10-CM | POA: Diagnosis not present

## 2020-12-06 LAB — CBC WITH DIFFERENTIAL/PLATELET
Abs Immature Granulocytes: 0.11 10*3/uL — ABNORMAL HIGH (ref 0.00–0.07)
Basophils Absolute: 0 10*3/uL (ref 0.0–0.1)
Basophils Relative: 0 %
Eosinophils Absolute: 0.3 10*3/uL (ref 0.0–0.5)
Eosinophils Relative: 2 %
HCT: 32.2 % — ABNORMAL LOW (ref 36.0–46.0)
Hemoglobin: 10.6 g/dL — ABNORMAL LOW (ref 12.0–15.0)
Immature Granulocytes: 1 %
Lymphocytes Relative: 6 %
Lymphs Abs: 0.7 10*3/uL (ref 0.7–4.0)
MCH: 28.9 pg (ref 26.0–34.0)
MCHC: 32.9 g/dL (ref 30.0–36.0)
MCV: 87.7 fL (ref 80.0–100.0)
Monocytes Absolute: 0.9 10*3/uL (ref 0.1–1.0)
Monocytes Relative: 7 %
Neutro Abs: 10.7 10*3/uL — ABNORMAL HIGH (ref 1.7–7.7)
Neutrophils Relative %: 84 %
Platelets: 327 10*3/uL (ref 150–400)
RBC: 3.67 MIL/uL — ABNORMAL LOW (ref 3.87–5.11)
RDW: 14 % (ref 11.5–15.5)
WBC: 12.7 10*3/uL — ABNORMAL HIGH (ref 4.0–10.5)
nRBC: 0 % (ref 0.0–0.2)

## 2020-12-06 LAB — COMPREHENSIVE METABOLIC PANEL
ALT: 10 U/L (ref 0–44)
AST: 14 U/L — ABNORMAL LOW (ref 15–41)
Albumin: 2.5 g/dL — ABNORMAL LOW (ref 3.5–5.0)
Alkaline Phosphatase: 77 U/L (ref 38–126)
Anion gap: 6 (ref 5–15)
BUN: 17 mg/dL (ref 8–23)
CO2: 35 mmol/L — ABNORMAL HIGH (ref 22–32)
Calcium: 8.2 mg/dL — ABNORMAL LOW (ref 8.9–10.3)
Chloride: 97 mmol/L — ABNORMAL LOW (ref 98–111)
Creatinine, Ser: 0.64 mg/dL (ref 0.44–1.00)
GFR, Estimated: >60 mL/min (ref >60)
Glucose, Bld: 105 mg/dL — ABNORMAL HIGH (ref 70–99)
Potassium: 3.3 mmol/L — ABNORMAL LOW (ref 3.5–5.1)
Sodium: 138 mmol/L (ref 135–145)
Total Bilirubin: 0.5 mg/dL (ref 0.3–1.2)
Total Protein: 5.3 g/dL — ABNORMAL LOW (ref 6.5–8.1)

## 2020-12-06 LAB — PATHOLOGIST SMEAR REVIEW

## 2020-12-06 MED ORDER — POTASSIUM CHLORIDE CRYS ER 20 MEQ PO TBCR
40.0000 meq | EXTENDED_RELEASE_TABLET | Freq: Two times a day (BID) | ORAL | Status: AC
  Administered 2020-12-06 (×2): 40 meq via ORAL
  Filled 2020-12-06 (×2): qty 2

## 2020-12-06 MED ORDER — MIRTAZAPINE 15 MG PO TBDP
7.5000 mg | ORAL_TABLET | Freq: Every day | ORAL | Status: DC
  Administered 2020-12-06 – 2020-12-10 (×5): 7.5 mg via ORAL
  Filled 2020-12-06 (×5): qty 0.5

## 2020-12-06 MED ORDER — POTASSIUM CHLORIDE CRYS ER 20 MEQ PO TBCR
40.0000 meq | EXTENDED_RELEASE_TABLET | Freq: Once | ORAL | Status: AC
  Administered 2020-12-06: 40 meq via ORAL
  Filled 2020-12-06: qty 2

## 2020-12-06 NOTE — Progress Notes (Signed)
Daily Progress Note   Patient Name: Samantha Fitzgerald       Date: 12/06/2020 DOB: [birth year]  Age: 87 y.o. MRN#: [id number] Attending Physician: Patrecia Pour, MD Primary Care Physician: System, Provider Not In Admit Date: 12/03/2020  Reason for Consultation/Follow-up: Establishing goals of care  Patient Profile/HPI:  87 y.o. female  with past medical history of COPD, grade 1 diastolic heart failure, SSS,  recurrent pleural effusions, Covid infection in June of this year admitted on 12/03/2020 with urosepsis, Covid positivity (low CT not likely to be an active infection), bilateral pleural effusions (presumed d/t hypoalbuminemia), lower extremity DVT.  Palliative consulted for [place]. 9/22- Blood cultures have also now returned growing Staph epidermis- she has been started on vancomycin.   Subjective: [name] is sitting up awake and alert, attempting to eat lunch- but appears only taking in bites and sips.  She used to be a Marine scientist.  She is feeling better today than she was yesterday.  I discussed her multiple problems with her and her daughter that could potentially be life limiting are definitely are affecting and will continue to affect her quality of life.  She has a living will and [name] is going to bring a copy of it.  We discussed artificial feeding. [name] is clear that she will trial artificial feeding if it is going to help restore her and improve her quality of life. She would not want artificial feeding in the face of terminal illness that was only prolonging her body, but was not helping to improve or maintain her level of function.  She shares that she just doesn't have an appetite. She wants to eat, but just isn't hungry.  We discussed utilizing some low dose mirtazipine for appetite  and also to help with her mood- she indicated some depresssion in our discussion yesterday.    Review of Systems  Constitutional:  Positive for malaise/fatigue and weight loss.  Respiratory:  Positive for shortness of breath.     Physical Exam Vitals and nursing note reviewed.  Constitutional:      Appearance: She is ill-appearing.  Pulmonary:     Comments: Respiratory status appears better than yesterday Neurological:     Mental Status: She is alert.     Comments: Short term memory deficits but otherwise cognitively intact  Vital Signs: BP (!) 133/56 (BP Location: Right Arm)   Pulse 83   Temp 98.3 F (36.8 C) (Oral)   Resp (!) 24   Ht 5\' 2"  (1.575 m)   Wt 59 kg Comment: admission wt  SpO2 100%   BMI 23.78 kg/m  SpO2: SpO2: 100 % O2 Device: O2 Device: Nasal Cannula O2 Flow Rate: O2 Flow Rate (L/min): 8 L/min  Intake/output summary:  Intake/Output Summary (Last 24 hours) at 12/06/2020 1301 Last data filed at 12/06/2020 0600 Gross per 24 hour  Intake 343.07 ml  Output 1575 ml  Net -1231.93 ml   LBM: Last BM Date: 12/05/20 Baseline Weight: Weight: 59 kg Most recent weight: Weight: 59 kg (admission wt)       Palliative Assessment/Data: PPS: 20%      Patient Active Problem List   Diagnosis Date Noted   Sepsis ([place]) 12/03/2020   COVID-19 virus infection 12/03/2020   Acute lower UTI 12/03/2020   Cellulitis 12/03/2020   Chronic diastolic CHF (congestive heart failure) ([place]) 12/03/2020   Hypoalbuminemia 12/03/2020   AKI (acute kidney injury) ([place]) 12/03/2020   Pleural effusion on left 09/12/2020   COPD (chronic obstructive pulmonary disease) ([place]) 09/12/2020   Sick sinus syndrome ([place]) 09/12/2020   Hypoxia 09/12/2020    Palliative Care Assessment & Plan    Assessment/Recommendations/Plan  Continue current plan of care Mirtazinpine 7.5mg  QHS to increase appetite [name] and Sherrey are clear that they only want interventions that will improve her  function and quality of life- would consider trial of artificial feeding if it was improving her strength, function, and quality of life [name] will bring a copy of patient's advanced directives Discussed with [name] the concern of her multiple comorbidities and high risk for decline and the need to be prepared if [place] fails to improve or decompensates PMT will check intermittently    Code Status: DNR  Prognosis:  Unable to determine  Discharge Planning: To Be Determined  Care plan was discussed with patient and her daughter  Thank you for allowing the Palliative Medicine Team to assist in the care of this patient.  Total time: 35 minutes Prolonged billing: No     Greater than 50%  of this time was spent counseling and coordinating care related to the above assessment and plan.  Mariana Kaufman, AGNP-C Palliative Medicine   Please contact Palliative Medicine Team phone at [phone] for questions and concerns.

## 2020-12-06 NOTE — Plan of Care (Signed)

## 2020-12-06 NOTE — Progress Notes (Signed)
PROGRESS NOTE  Samantha Fitzgerald  VVO:[id number] DOB: January 11, 1925 DOA: 12/03/2020 PCP: System, Provider Not In   Brief Narrative: Samantha Fitzgerald is a 87 y.o. female with medical history significant of COPD 2 L nasal cannula, sick sinus syndrome, Grade 1 diastolic HF, with recurrent large left-sided pleural effusion, recently diagnosed with covid June/July 2022.  Patient presents to the ED from [facility] facility with hypotension general weakness with reportedly acute onset blood pressure noted to be as low as 80/60 with bilateral lower extremity edema.  Assessment & Plan: Active Problems:   Pleural effusion on left   COPD (chronic obstructive pulmonary disease) (HCC)   Sick sinus syndrome (HCC)   Sepsis (HCC)   COVID-19 virus infection   Acute lower UTI   Cellulitis   Chronic diastolic CHF (congestive heart failure) (HCC)   Hypoalbuminemia   AKI (acute kidney injury) ([place])  Severe Sepsis secondary to UTI, POA: Leukocytosis, tachycardia with lactic acidosis = severe sepsis  - Proteus with resistance noted on culture. Changed CTX to cefepime. WBC responding appropriately.   Rule out cellulitis of left lower extremity - Cefepime and vancomycin being given. Vanc added for GPC's discussed below.   Positive blood cultures: 2 aerobic bottles out of 4 total bottles collected on 9/19 grew GPC in clusters. MRSE in one of them.  - Add on vancomycin empirically for now, repeat cultures ordered.   Acute on chronic hypoxic respiratory failure: Likely related to symptomatic pleural effusions (R > L) - s/p 800cc red-tinged w/protein <3, cyto pending, with negative gram stain on L thoracentesis 9/21. Follow up culture. Thora last done 6/30 with 850cc removed. - Titrate oxygen as needed hopefully down to baseline 2L in coming days.  - Restarted lasix as below    Acute on chronic diastolic CHF: -2.6R UOP yesterday - Continue lasix IV BID diuresis and monitoring I/O, weights, Cr.      Acute DVT of the right common femoral vein and SF junction:  - Restart therapeutic anticoagulation 9/22 given red-tinged nature of effusion. Hgb stable at 10.6 and will be monitored.  Sick sinus syndrome: Continue home antiarrhythmics, digoxin (level 1.4) and flecainide.    COPD: Not in exacerbation.  - Continue inhaled therapies.    History of covid-19 infection: Treated with remdesivir and steroids. No pulmonary symptoms, infiltrate on CXR. CT is 42 not consistent with active infection.  - No isolation/Tx required.    Hypokalemia:  - Supplement BID due to ongoing loop diuretic use.  DVT prophylaxis: Lovenox 1mg /kg q12h to restart 9/22. Code Status: DNR Family Communication: None at bedside Disposition Plan:  Status is: Inpatient  Remains inpatient appropriate because:Ongoing diagnostic testing needed not appropriate for outpatient work up and Inpatient level of care appropriate due to severity of illness  Dispo: The patient is from: ALF              Anticipated d/c is to: ALF              Patient currently is not medically stable to d/c.   Consultants:  IR  Procedures:  U/S-guided thoracentesis on left 12/05/2020 by Dr. Serafina Royals.  Antimicrobials: Ceftriaxone 9/19 >> Vancomycin 9/21 >>  Subjective: Breathing slightly better than yesterday without chest pain. Tolerated procedure well yesterday. No fevers or chills. When O2 supplementation gets turned down she does becomes more dyspneic but not hypoxic. No wheezing.   Objective: Vitals:   12/05/20 1551 12/05/20 2051 12/06/20 0556 12/06/20 1252  BP: 139/61 (!) 135/59 (!) 142/61 Marland Kitchen)  133/56  Pulse: 94 95 84 83  Resp:  18 16 (!) 24  Temp: 98.3 F (36.8 C) 97.7 F (36.5 C) 98 F (36.7 C) 98.3 F (36.8 C)  TempSrc: Oral Oral  Oral  SpO2: 100% 100% 100% 100%  Weight:      Height:        Intake/Output Summary (Last 24 hours) at 12/06/2020 1759 Last data filed at 12/06/2020 1620 Gross per 24 hour  Intake 593.07 ml   Output 1575 ml  Net -981.93 ml   Filed Weights   12/04/20 1403 12/05/20 1342  Weight: 59 kg 59 kg   Gen: 87 y.o. female in no distress Pulm: Nonlabored but tachypneic 100% on 8L O2. Diminished with crackles bilaterally, improved aeration on left.  CV: Regular rate and rhythm. No murmur, rub, or gallop. No JVD, stable 2+ pitting dependent edema. GI: Abdomen soft, non-tender, non-distended, with normoactive bowel sounds.  Ext: Warm, no deformities Skin: No other rashes, lesions or ulcers on visualized skin other than that depicted below. Specifically, left anterior leg has flaccid bulla irregularly with erythematous tender base without induration or fluctuance. Significant pitting edema in legs bilaterally remains.  Neuro: Alert and oriented. No focal neurological deficits. Psych: Judgement and insight appear fair. Mood euthymic & affect congruent. Behavior is appropriate.    Data Reviewed: I have personally reviewed following labs and imaging studies  CBC: Recent Labs  Lab 12/03/20 1058 12/04/20 0420 12/05/20 0335 12/06/20 0355  WBC 24.2* 16.7* 15.8* 12.7*  NEUTROABS 22.2* 14.4* 13.3* 10.7*  HGB 12.0 10.5* 11.2* 10.6*  HCT 37.7 32.7* 34.4* 32.2*  MCV 90.8 89.8 88.2 87.7  PLT 355 321 342 570   Basic Metabolic Panel: Recent Labs  Lab 12/03/20 1058 12/03/20 2128 12/04/20 0420 12/05/20 0335 12/06/20 0355  NA 135  --  138 135 138  K 4.0  --  4.2 4.0 3.3*  CL 93*  --  98 95* 97*  CO2 30  --  33* 27 35*  GLUCOSE 134*  --  83 63* 105*  BUN 17  --  18 17 17   CREATININE 1.00  --  0.86 0.77 0.64  CALCIUM 8.6*  --  8.6* 7.9* 8.2*  MG  --  1.8 1.8  --   --   PHOS  --  3.8 3.8  --   --    GFR: Estimated Creatinine Clearance: 34 mL/min (by C-G formula based on SCr of 0.64 mg/dL). Liver Function Tests: Recent Labs  Lab 12/03/20 1058 12/04/20 0420 12/05/20 0335 12/06/20 0355  AST 20 20 15  14*  ALT 14 12 11 10   ALKPHOS 94 78 83 77  BILITOT 0.7 0.6 0.9 0.5  PROT 6.3*  5.7* 5.3* 5.3*  ALBUMIN 2.9* 2.7* 2.6* 2.5*   No results for input(s): LIPASE, AMYLASE in the last 168 hours. No results for input(s): AMMONIA in the last 168 hours. Coagulation Profile: Recent Labs  Lab 12/03/20 1058 12/04/20 0420  INR 1.0 1.1   Cardiac Enzymes: Recent Labs  Lab 12/03/20 2128  CKTOTAL 31*   BNP (last 3 results) No results for input(s): PROBNP in the last 8760 hours. HbA1C: No results for input(s): HGBA1C in the last 72 hours. CBG: No results for input(s): GLUCAP in the last 168 hours. Lipid Profile: No results for input(s): CHOL, HDL, LDLCALC, TRIG, CHOLHDL, LDLDIRECT in the last 72 hours. Thyroid Function Tests: Recent Labs    12/04/20 0420  TSH 2.915   Anemia Panel: Recent Labs  12/04/20 0420  FERRITIN 112  116   Urine analysis:    Component Value Date/Time   COLORURINE STRAW (A) 12/03/2020 1458   APPEARANCEUR TURBID (A) 12/03/2020 1458   LABSPEC  12/03/2020 1458    TEST NOT REPORTED DUE TO COLOR INTERFERENCE OF URINE PIGMENT   PHURINE  12/03/2020 1458    TEST NOT REPORTED DUE TO COLOR INTERFERENCE OF URINE PIGMENT   GLUCOSEU (A) 12/03/2020 1458    TEST NOT REPORTED DUE TO COLOR INTERFERENCE OF URINE PIGMENT   HGBUR (A) 12/03/2020 1458    TEST NOT REPORTED DUE TO COLOR INTERFERENCE OF URINE PIGMENT   BILIRUBINUR (A) 12/03/2020 1458    TEST NOT REPORTED DUE TO COLOR INTERFERENCE OF URINE PIGMENT   KETONESUR (A) 12/03/2020 1458    TEST NOT REPORTED DUE TO COLOR INTERFERENCE OF URINE PIGMENT   PROTEINUR (A) 12/03/2020 1458    TEST NOT REPORTED DUE TO COLOR INTERFERENCE OF URINE PIGMENT   NITRITE (A) 12/03/2020 1458    TEST NOT REPORTED DUE TO COLOR INTERFERENCE OF URINE PIGMENT   LEUKOCYTESUR (A) 12/03/2020 1458    TEST NOT REPORTED DUE TO COLOR INTERFERENCE OF URINE PIGMENT   Recent Results (from the past 240 hour(s))  Culture, blood (Routine x 2)     Status: Abnormal   Collection Time: 12/03/20 10:54 AM   Specimen: BLOOD LEFT  FOREARM  Result Value Ref Range Status   Specimen Description   Final    BLOOD LEFT FOREARM Performed at [facility], [place] [address], [place], [place] [address]    Special Requests   Final    BOTTLES DRAWN AEROBIC AND ANAEROBIC Blood Culture adequate volume Performed at [place] [address], [place], [place] [address]    Culture  Setup Time   Final    GRAM POSITIVE COCCI IN CLUSTERS AEROBIC BOTTLE ONLY Organism ID to follow CRITICAL RESULT CALLED TO, READ BACK BY AND VERIFIED WITH: A TUCKER PHARMD 1652 12/04/20 A BROWNING    Culture (A)  Final    STAPHYLOCOCCUS EPIDERMIDIS THE SIGNIFICANCE OF ISOLATING THIS ORGANISM FROM A SINGLE SET OF BLOOD CULTURES WHEN MULTIPLE SETS ARE DRAWN IS UNCERTAIN. PLEASE NOTIFY THE MICROBIOLOGY DEPARTMENT WITHIN ONE WEEK IF SPECIATION AND SENSITIVITIES ARE REQUIRED. Performed at [facility], [place] [address], [place],  [address]    Report Status 12/06/2020 FINAL  Final  Blood Culture ID Panel (Reflexed)     Status: Abnormal   Collection Time: 12/03/20 10:54 AM  Result Value Ref Range Status   Enterococcus faecalis NOT DETECTED NOT DETECTED Final   Enterococcus Faecium NOT DETECTED NOT DETECTED Final   Listeria monocytogenes NOT DETECTED NOT DETECTED Final   Staphylococcus species DETECTED (A) NOT DETECTED Final    Comment: CRITICAL RESULT CALLED TO, READ BACK BY AND VERIFIED WITH: A TUCKER PHARMD 1652 12/04/20 A BROWNING    Staphylococcus aureus (BCID) NOT DETECTED NOT DETECTED Final   Staphylococcus epidermidis DETECTED (A) NOT DETECTED Final    Comment: Methicillin (oxacillin) resistant coagulase negative staphylococcus. Possible blood culture contaminant (unless isolated from more than one blood culture draw or clinical case suggests pathogenicity). No antibiotic treatment is indicated for blood  culture contaminants. CRITICAL RESULT CALLED TO, READ BACK BY AND VERIFIED WITH: A TUCKER PHARMD  1652 12/04/20 A BROWNING    Staphylococcus lugdunensis NOT DETECTED NOT DETECTED Final   Streptococcus species NOT DETECTED NOT DETECTED Final   Streptococcus agalactiae NOT DETECTED NOT DETECTED Final  Streptococcus pneumoniae NOT DETECTED NOT DETECTED Final   Streptococcus pyogenes NOT DETECTED NOT DETECTED Final   A.calcoaceticus-baumannii NOT DETECTED NOT DETECTED Final   Bacteroides fragilis NOT DETECTED NOT DETECTED Final   Enterobacterales NOT DETECTED NOT DETECTED Final   Enterobacter cloacae complex NOT DETECTED NOT DETECTED Final   Escherichia coli NOT DETECTED NOT DETECTED Final   Klebsiella aerogenes NOT DETECTED NOT DETECTED Final   Klebsiella oxytoca NOT DETECTED NOT DETECTED Final   Klebsiella pneumoniae NOT DETECTED NOT DETECTED Final   Proteus species NOT DETECTED NOT DETECTED Final   Salmonella species NOT DETECTED NOT DETECTED Final   Serratia marcescens NOT DETECTED NOT DETECTED Final   Haemophilus influenzae NOT DETECTED NOT DETECTED Final   Neisseria meningitidis NOT DETECTED NOT DETECTED Final   Pseudomonas aeruginosa NOT DETECTED NOT DETECTED Final   Stenotrophomonas maltophilia NOT DETECTED NOT DETECTED Final   Candida albicans NOT DETECTED NOT DETECTED Final   Candida auris NOT DETECTED NOT DETECTED Final   Candida glabrata NOT DETECTED NOT DETECTED Final   Candida krusei NOT DETECTED NOT DETECTED Final   Candida parapsilosis NOT DETECTED NOT DETECTED Final   Candida tropicalis NOT DETECTED NOT DETECTED Final   Cryptococcus neoformans/gattii NOT DETECTED NOT DETECTED Final   Methicillin resistance mecA/C DETECTED (A) NOT DETECTED Final    Comment: CRITICAL RESULT CALLED TO, READ BACK BY AND VERIFIED WITHAustin Miles PHARMD 1652 12/04/20 A BROWNING Performed at [facility], [address], [place], [place] [address]   Culture, blood (Routine x 2)     Status: Abnormal (Preliminary result)   Collection Time: 12/03/20 10:59 AM   Specimen: BLOOD  Result  Value Ref Range Status   Specimen Description   Final    BLOOD RIGHT ANTECUBITAL Performed at [place] [address] [place], [place] [address]    Special Requests   Final    BOTTLES DRAWN AEROBIC AND ANAEROBIC Blood Culture adequate volume Performed at [place] [address] [place], [place] [address]    Culture  Setup Time   Final    GRAM POSITIVE COCCI IN CLUSTERS AEROBIC BOTTLE ONLY CRITICAL VALUE NOTED.  VALUE IS CONSISTENT WITH PREVIOUSLY REPORTED AND CALLED VALUE.    Culture (A)  Final    STAPHYLOCOCCUS EPIDERMIDIS SUSCEPTIBILITIES TO FOLLOW Performed at [facility], [place] [address] [place], [place] [address]    Report Status PENDING  Incomplete  Resp Panel by RT-PCR (Flu A&B, Covid) Nasopharyngeal Swab     Status: Abnormal   Collection Time: 12/03/20 12:03 PM   Specimen: Nasopharyngeal Swab; Nasopharyngeal(NP) swabs in vial transport medium  Result Value Ref Range Status   SARS Coronavirus 2 by RT PCR POSITIVE (A) NEGATIVE Final    Comment: RESULT CALLED TO, READ BACK BY AND VERIFIED WITH: JACOBS,D. RN @1310  ON 12/03/2020 BY COHEN,K (NOTE) SARS-CoV-2 target nucleic acids are DETECTED.  The SARS-CoV-2 RNA is generally detectable in upper respiratory specimens during the acute phase of infection. Positive results are indicative of the presence of the identified virus, but do not rule out bacterial infection or co-infection with other pathogens not detected by the test. Clinical correlation with patient history and other diagnostic information is necessary to determine patient infection status. The expected result is Negative.  Fact Sheet for Patients: EntrepreneurPulse.com.au  Fact Sheet for Healthcare Providers: IncredibleEmployment.be  This test is not yet approved or cleared by the [place] FDA and  has been authorized for detection and/or diagnosis  of SARS-CoV-2  by FDA under an Emergency Use Authorization (EUA).  This EUA will remain in effect (meaning this t est can be used) for the duration of  the COVID-19 declaration under Section 564(b)(1) of the Act, 21 U.S.C. section 360bbb-3(b)(1), unless the authorization is terminated or revoked sooner.     Influenza A by PCR NEGATIVE NEGATIVE Final   Influenza B by PCR NEGATIVE NEGATIVE Final    Comment: (NOTE) The Xpert Xpress SARS-CoV-2/FLU/RSV plus assay is intended as an aid in the diagnosis of influenza from Nasopharyngeal swab specimens and should not be used as a sole basis for treatment. Nasal washings and aspirates are unacceptable for Xpert Xpress SARS-CoV-2/FLU/RSV testing.  Fact Sheet for Patients: EntrepreneurPulse.com.au  Fact Sheet for Healthcare Providers: IncredibleEmployment.be  This test is not yet approved or cleared by the [place] FDA and has been authorized for detection and/or diagnosis of SARS-CoV-2 by FDA under an Emergency Use Authorization (EUA). This EUA will remain in effect (meaning this test can be used) for the duration of the COVID-19 declaration under Section 564(b)(1) of the Act, 21 U.S.C. section 360bbb-3(b)(1), unless the authorization is terminated or revoked.  Performed at [facility], [place] [address], [place], [place] [address]   Urine Culture     Status: Abnormal (Preliminary result)   Collection Time: 12/03/20  2:59 PM   Specimen: Urine, Clean Catch  Result Value Ref Range Status   Specimen Description   Final    URINE, CLEAN CATCH Performed at  [facility], [place] [address], [place], [place] [address]    Special Requests   Final    NONE Performed at [facility], [place] [address], [place], [place] [address]    Culture (A)  Final    >=100,000 COLONIES/mL PROTEUS VULGARIS >=100,000 COLONIES/mL ENTEROCOCCUS FAECALIS SUSCEPTIBILITIES TO  FOLLOW Performed at [facility], [place] [address] [place], [place] [address]    Report Status PENDING  Incomplete   Organism ID, Bacteria PROTEUS VULGARIS (A)  Final      Susceptibility   Proteus vulgaris - MIC*    AMPICILLIN >=32 RESISTANT Resistant     CEFAZOLIN >=64 RESISTANT Resistant     CEFEPIME <=0.12 SENSITIVE Sensitive     CIPROFLOXACIN <=0.25 SENSITIVE Sensitive     GENTAMICIN <=1 SENSITIVE Sensitive     IMIPENEM 2 SENSITIVE Sensitive     NITROFURANTOIN 128 RESISTANT Resistant     TRIMETH/SULFA <=20 SENSITIVE Sensitive     AMPICILLIN/SULBACTAM 16 INTERMEDIATE Intermediate     PIP/TAZO <=4 SENSITIVE Sensitive     * >=100,000 COLONIES/mL PROTEUS VULGARIS  MRSA Next Gen by PCR, Nasal     Status: None   Collection Time: 12/04/20  4:06 PM   Specimen: Nasal Mucosa; Nasal Swab  Result Value Ref Range Status   MRSA by PCR Next Gen NOT DETECTED NOT DETECTED Final    Comment: (NOTE) The GeneXpert MRSA Assay (FDA approved for NASAL specimens only), is one component of a comprehensive MRSA colonization surveillance program. It is not intended to diagnose MRSA infection nor to guide or monitor treatment for MRSA infections. Test performance is not FDA approved in patients less than 34 years old. Performed at [facility], [place] [address], [place], [place] [address]   Culture, blood (routine x 2)     Status: None (Preliminary result)   Collection Time: 12/05/20  7:38 AM   Specimen: BLOOD RIGHT HAND  Result Value Ref Range Status  Specimen Description   Final    BLOOD RIGHT HAND Performed at [place] [address], [place], [place] [address]    Special Requests   Final    BOTTLES DRAWN AEROBIC ONLY Blood Culture adequate volume Performed at [place] [address] [place], [place] [address]    Culture   Final    NO GROWTH 1 DAY Performed at [facility], [place] [address], [place], [place]  [address]    Report Status PENDING  Incomplete  Culture, blood (routine x 2)     Status: None (Preliminary result)   Collection Time: 12/05/20  7:38 AM   Specimen: BLOOD  Result Value Ref Range Status   Specimen Description   Final    BLOOD RIGHT ANTECUBITAL Performed at [place] [address], [place], [place] [address]    Special Requests   Final    BOTTLES DRAWN AEROBIC ONLY Blood Culture adequate volume Performed at [place] [address] [place], [place] [address]    Culture   Final    NO GROWTH 1 DAY Performed at [facility], [place] [address], [place], [place] [address]    Report Status PENDING  Incomplete  Body fluid culture w Gram Stain     Status: None (Preliminary result)   Collection Time: 12/05/20  3:27 PM   Specimen: PATH Cytology Pleural fluid  Result Value Ref Range Status   Specimen Description   Final    PLEURAL Performed at [place] [address], [place], [place] [address]    Special Requests   Final    NONE Performed at [facility], [place] [address] [place], [place] [address]    Gram Stain   Final    FEW SQUAMOUS EPITHELIAL CELLS PRESENT FEW WBC SEEN NO ORGANISMS SEEN    Culture   Final    NO GROWTH < 24 HOURS Performed at [facility], [address] [place],  [address]    Report Status PENDING  Incomplete      Radiology Studies: DG Chest 1 View  Result Date: 12/05/2020 CLINICAL DATA:  Post thoracentesis. EXAM: CHEST  1 VIEW COMPARISON:  Chest x-ray 12/03/2020. FINDINGS: There is a small left pleural effusion which is decreased from the prior examination. There are findings suspicious for tiny left apical pneumothorax. There is no mediastinal shift. Small right pleural effusion is unchanged from the prior examination. The cardiomediastinal silhouette is stable. Surgical changes overlie the right hilar region. There are atherosclerotic  calcifications of the aorta. Osseous structures are within normal limits. IMPRESSION: 1. Tiny left apical pneumothorax. 2. Small left pleural effusion has decreased. 3. Small right pleural effusion is unchanged. 4.  Aortic Atherosclerosis (ICD10-I70.0). Electronically Signed   By: Ronney Asters M.D.   On: 12/05/2020 15:48   US THORACENTESIS ASP PLEURAL SPACE W/IMG GUIDE  Result Date: 12/05/2020 INDICATION: Pleural effusion. EXAM: ULTRASOUND GUIDED DIAGNOSITIC AND THERAPEUTIC THORACENTESIS MEDICATIONS: None. COMPLICATIONS: None immediate. PROCEDURE: An ultrasound guided thoracentesis was thoroughly discussed with the patient and questions answered. The benefits, risks, alternatives and complications were also discussed. The patient understands and wishes to proceed with the procedure. Written consent was obtained. Ultrasound was performed to localize and mark an adequate pocket of fluid in the left chest. The area was then prepped and draped in the normal sterile fashion. 1% Lidocaine was used for local anesthesia. Under ultrasound guidance a 6 Fr  Safe-T-Centesis catheter was introduced. Thoracentesis was performed. The catheter was removed and a dressing applied. FINDINGS: A total of approximately 800 of red-tinged fluid was removed. Samples were sent to the laboratory as requested by the clinical team. IMPRESSION: Successful ultrasound guided diagnostic and therapeutic thoracentesis yielding 800cc of pleural fluid. Electronically Signed   By: Ruthann Cancer M.D.   On: 12/05/2020 15:32    Scheduled Meds:  albuterol  2 puff Inhalation BID   Chlorhexidine Gluconate Cloth  6 each Topical Q0600   digoxin  0.125 mg Oral Daily   enoxaparin (LOVENOX) injection  1 mg/kg Subcutaneous Q12H   feeding supplement  237 mL Oral BID BM   flecainide  75 mg Oral BID   fluticasone furoate-vilanterol  1 puff Inhalation Daily   And   umeclidinium bromide  1 puff Inhalation Daily   furosemide  40 mg Intravenous Q12H    mirtazapine  7.5 mg Oral QHS   multivitamin with minerals  1 tablet Oral Daily   potassium chloride  40 mEq Oral BID   sodium chloride flush  3 mL Intravenous Q12H   Continuous Infusions:  sodium chloride     ceFEPime (MAXIPIME) IV Stopped (12/06/20 1013)   vancomycin Stopped (12/06/20 1335)     LOS: 3 days   Time spent: 25 minutes.  Patrecia Pour, MD [facility] [email or web address] 12/06/2020, 5:59 PM

## 2020-12-06 NOTE — Progress Notes (Signed)
MD notes reports, History of covid-19 infection: NO active infection.  No isolation/Tx required. PPE discontinued as ordered per MD. Darcella Cheshire

## 2020-12-06 NOTE — Evaluation (Signed)
Physical Therapy Evaluation Patient Details Name: Samantha Fitzgerald MRN: [id number] DOB: 02/27/1925 Today's Date: 12/06/2020  History of Present Illness  Patient is 87 y.o. female with PMH for COPD on 2L nasal cannula, sick sinus syndrome, CHF admitted for shortness of breath and dry cough.  Found to have large left-sided pleural effusion, hx of recent COVID-19 positive and Severe Sepsis secondary to UTI  Clinical Impression  Pt admitted with above diagnosis. Pt currently with functional limitations due to the deficits listed below (see PT Problem List). Pt will benefit from skilled PT to increase their independence and safety with mobility to allow discharge to the venue listed below.  Pt agreeable to mobilize and assisted OOB to recliner.  Pt reports being mostly independent at ALF however questionable historian.  Recommend d/c to SNF due to current assist level unless ALF can provide mod assist.       Recommendations for follow up therapy are one component of a multi-disciplinary discharge planning process, led by the attending physician.  Recommendations may be updated based on patient status, additional functional criteria and insurance authorization.  Follow Up Recommendations SNF    Equipment Recommendations  None recommended by PT    Recommendations for Other Services       Precautions / Restrictions Precautions Precautions: Fall Precaution Comments: currently on 8L HFNC (2L O2 baseline) Restrictions Weight Bearing Restrictions: No      Mobility  Bed Mobility Overal bed mobility: Needs Assistance Bed Mobility: Supine to Sit     Supine to sit: Mod assist;HOB elevated     General bed mobility comments: assist for scooting to EOB, utilized bed pad    Transfers Overall transfer level: Needs assistance Equipment used: Rolling walker (2 wheeled) Transfers: Sit to/from [facility] Sit to Stand: Mod assist Stand pivot transfers: Min assist        General transfer comment: verbal cues for technique, assist to rise and steady, remained on 8L HFNC, SPO2 92% after transfer  Ambulation/Gait                Stairs            Wheelchair Mobility    Modified Rankin (Stroke Patients Only)       Balance Overall balance assessment: Needs assistance Sitting-balance support: No upper extremity supported Sitting balance-Leahy Scale: Fair     Standing balance support: Bilateral upper extremity supported Standing balance-Leahy Scale: Poor Standing balance comment: reliant on UE support                             Pertinent Vitals/Pain Pain Assessment: No/denies pain    Home Living Family/patient expects to be discharged to:: Assisted living                      Prior Function Level of Independence: Needs assistance   Gait / Transfers Assistance Needed: pt recalls she is from facility, pt states she is independent with walking and ADLs however questionable historian     Comments: typically 2L O2 baseline     Hand Dominance   Dominant Hand: Right    Extremity/Trunk Assessment   Upper Extremity Assessment Upper Extremity Assessment: RUE deficits/detail;LUE deficits/detail RUE Deficits / Details: WFL ROM, grossly 4/5 strength throughout RUE Sensation: WNL RUE Coordination: WNL LUE Deficits / Details: WFL ROM, 4-/5 shoulder, 4-/5 elbow, 4+/5 wrist, 4-/5 grip LUE Sensation: WNL LUE Coordination: WNL    Lower Extremity  Assessment Lower Extremity Assessment: Generalized weakness (kerlix dressing to bil lower legs)    Cervical / Trunk Assessment Cervical / Trunk Assessment: Kyphotic  Communication   Communication: HOH  Cognition Arousal/Alertness: Awake/alert Behavior During Therapy: WFL for tasks assessed/performed Overall Cognitive Status: No family/caregiver present to determine baseline cognitive functioning                                 General Comments: able to  recall she is in hospital however unable to name or state city, no aware of year, orientated to person      General Comments      Exercises     Assessment/Plan    PT Assessment Patient needs continued PT services  PT Problem List Decreased balance;Decreased activity tolerance;Decreased strength;Decreased mobility;Decreased knowledge of use of DME;Cardiopulmonary status limiting activity;Decreased cognition       PT Treatment Interventions Gait training;DME instruction;Therapeutic exercise;Balance training;Functional mobility training;Therapeutic activities;Patient/family education    PT Goals (Current goals can be found in the Care Plan section)  Acute Rehab PT Goals Patient Stated Goal: to go to rehab PT Goal Formulation: With patient Time For Goal Achievement: 12/20/20 Potential to Achieve Goals: Good    Frequency Min 2X/week   Barriers to discharge        Co-evaluation PT/OT/SLP Co-Evaluation/Treatment: Yes Reason for Co-Treatment: For patient/therapist safety;To address functional/ADL transfers           AM-PAC PT "6 Clicks" Mobility  Outcome Measure Help needed turning from your back to your side while in a flat bed without using bedrails?: A Little Help needed moving from lying on your back to sitting on the side of a flat bed without using bedrails?: A Little Help needed moving to and from a bed to a chair (including a wheelchair)?: A Little Help needed standing up from a chair using your arms (e.g., wheelchair or bedside chair)?: A Little Help needed to walk in hospital room?: A Lot Help needed climbing 3-5 steps with a railing? : A Lot 6 Click Score: 16    End of Session Equipment Utilized During Treatment: Gait belt Activity Tolerance: Patient tolerated treatment well Patient left: with call bell/phone within reach;in chair;with chair alarm set Nurse Communication: Mobility status PT Visit Diagnosis: Difficulty in walking, not elsewhere classified  (R26.2);Muscle weakness (generalized) (M62.81)    Time: 1535-1600 PT Time Calculation (min) (ACUTE ONLY): 25 min   Charges:   PT Evaluation $PT Eval Low Complexity: 1 Low        Kati PT, DPT Acute Rehabilitation Services Pager: [phone] Office: [place] 12/06/2020, 5:11 PM

## 2020-12-06 NOTE — Evaluation (Addendum)
Occupational Therapy Evaluation Patient Details Name: Samantha Fitzgerald MRN: [id number] DOB: [birth year] Today's Date: 12/06/2020   History of Present Illness Patient is 87 y.o. female with PMH for COPD on 2L nasal cannula, sick sinus syndrome, CHF admitted for shortness of breath and dry cough.  Found to have large left-sided pleural effusion, hx of recent COVID-19 positive and Severe Sepsis secondary to UTI   Clinical Impression   Mrs. Samantha Fitzgerald is a 87 year old woman who presents with generalized weakness, decreased activity tolerance, impaired balance and cardiopulmonary endurance currently on 8 L HFNC. On evaluation she was mod assist to stand, min assist to take steps to recliner and needing mod - max assist for LB ADLs and toileting and setup and seated position for UB. Patient's o2 sat 99-100% at rest on 8 L and maintained above 90% with limited activity. Patient will benefit from skilled OT services while in hospital to improve deficits and learn compensatory strategies as needed in order to return to PLOF. Patient agreeable to short term rehab at discharge.        Recommendations for follow up therapy are one component of a multi-disciplinary discharge planning process, led by the attending physician.  Recommendations may be updated based on patient status, additional functional criteria and insurance authorization.   Follow Up Recommendations  SNF    Equipment Recommendations  None recommended by OT    Recommendations for Other Services       Precautions / Restrictions Precautions Precautions: Fall Precaution Comments: currently on 8L HFNC (2L O2 baseline) Restrictions Weight Bearing Restrictions: No      Mobility Bed Mobility Overal bed mobility: Needs Assistance Bed Mobility: Supine to Sit     Supine to sit: Mod assist;HOB elevated     General bed mobility comments: assist for scooting to EOB, utilized bed pad    Transfers Overall transfer level: Needs  assistance Equipment used: Rolling walker (2 wheeled) Transfers: Sit to/from [facility] Sit to Stand: Mod assist Stand pivot transfers: Min assist       General transfer comment: verbal cues for technique, assist to rise and steady, remained on 8L HFNC, SPO2 92% after transfer    Balance Overall balance assessment: Needs assistance Sitting-balance support: No upper extremity supported Sitting balance-Leahy Scale: Fair     Standing balance support: Bilateral upper extremity supported Standing balance-Leahy Scale: Poor Standing balance comment: reliant on UE support                           ADL either performed or assessed with clinical judgement   ADL Overall ADL's : Needs assistance/impaired Eating/Feeding: Set up;Sitting   Grooming: Set up;Sitting   Upper Body Bathing: Set up;Sitting   Lower Body Bathing: Moderate assistance;Sit to/from stand   Upper Body Dressing : Set up;Sitting   Lower Body Dressing: Moderate assistance;Sit to/from stand   Toilet Transfer: Minimal assistance;Stand-pivot;BSC;RW   Toileting- Clothing Manipulation and Hygiene: Maximal assistance;Sit to/from stand       Functional mobility during ADLs: Minimal assistance;Rolling walker       Vision Patient Visual Report: No change from baseline       Perception     Praxis      Pertinent Vitals/Pain Pain Assessment: No/denies pain     Hand Dominance Right   Extremity/Trunk Assessment Upper Extremity Assessment Upper Extremity Assessment: RUE deficits/detail;LUE deficits/detail RUE Deficits / Details: WFL ROM, grossly 4/5 strength throughout RUE Sensation: WNL RUE Coordination:  WNL LUE Deficits / Details: WFL ROM, 4-/5 shoulder, 4-/5 elbow, 4+/5 wrist, 4-/5 grip LUE Sensation: WNL LUE Coordination: WNL   Lower Extremity Assessment Lower Extremity Assessment: Generalized weakness (kerlix dressing to bil lower legs)   Cervical / Trunk  Assessment Cervical / Trunk Assessment: Kyphotic   Communication Communication Communication: HOH   Cognition Arousal/Alertness: Awake/alert Behavior During Therapy: WFL for tasks assessed/performed Overall Cognitive Status: No family/caregiver present to determine baseline cognitive functioning                                 General Comments: able to recall she is in hospital however unable to name or state city, no aware of year, orientated to person   General Comments       Exercises     Shoulder Instructions      Home Living Family/patient expects to be discharged to:: Assisted living                                        Prior Functioning/Environment Level of Independence: Needs assistance  Gait / Transfers Assistance Needed: pt recalls she is from facility, pt states she is independent with walking and ADLs however questionable historian     Comments: typically 2L O2 baseline        OT Problem List: Decreased strength;Decreased activity tolerance;Impaired balance (sitting and/or standing);Cardiopulmonary status limiting activity;Decreased knowledge of use of DME or AE      OT Treatment/Interventions: Self-care/ADL training;Therapeutic exercise;DME and/or AE instruction;Therapeutic activities;Energy conservation;Patient/family education;Balance training    OT Goals(Current goals can be found in the care plan section) Acute Rehab OT Goals Patient Stated Goal: to go to rehab OT Goal Formulation: With patient Time For Goal Achievement: 12/20/20 Potential to Achieve Goals: Good ADL Goals Pt Will Perform Lower Body Dressing: with min assist;sit to/from stand Pt Will Transfer to Toilet: ambulating;regular height toilet;with min guard assist;grab bars Pt Will Perform Toileting - Clothing Manipulation and hygiene: with min assist;sit to/from stand Additional ADL Goal #1: Patient will perform 6 min functional activity or exercise activity  as evidence of improving activity tolerance  OT Frequency: Min 2X/week   Barriers to D/C:            Co-evaluation PT/OT/SLP Co-Evaluation/Treatment: Yes Reason for Co-Treatment: For patient/therapist safety;To address functional/ADL transfers          AM-PAC OT "6 Clicks" Daily Activity     Outcome Measure Help from another person eating meals?: A Little Help from another person taking care of personal grooming?: A Little Help from another person toileting, which includes using toliet, bedpan, or urinal?: A Lot Help from another person bathing (including washing, rinsing, drying)?: A Lot Help from another person to put on and taking off regular upper body clothing?: A Little Help from another person to put on and taking off regular lower body clothing?: A Lot 6 Click Score: 15   End of Session Equipment Utilized During Treatment: Rolling walker Nurse Communication: Mobility status  Activity Tolerance: Patient tolerated treatment well Patient left: in chair;with chair alarm set  OT Visit Diagnosis: Unsteadiness on feet (R26.81);Muscle weakness (generalized) (M62.81)                Time: 2202-5427 OT Time Calculation (min): 25 min Charges:  OT General Charges $OT Visit: 1 Visit OT Evaluation $OT Eval  Low Complexity: 1 Low  Chanon Loney, OTR/L [place]  Office [phone] Pager: [phone]   Lenward Chancellor 12/06/2020, 5:15 PM

## 2020-12-07 DIAGNOSIS — I5032 Chronic diastolic (congestive) heart failure: Secondary | ICD-10-CM | POA: Diagnosis not present

## 2020-12-07 DIAGNOSIS — L03116 Cellulitis of left lower limb: Secondary | ICD-10-CM | POA: Diagnosis not present

## 2020-12-07 DIAGNOSIS — N390 Urinary tract infection, site not specified: Secondary | ICD-10-CM | POA: Diagnosis not present

## 2020-12-07 DIAGNOSIS — N179 Acute kidney failure, unspecified: Secondary | ICD-10-CM | POA: Diagnosis not present

## 2020-12-07 LAB — CULTURE, BLOOD (ROUTINE X 2)
Special Requests: ADEQUATE
Special Requests: ADEQUATE

## 2020-12-07 LAB — COMPREHENSIVE METABOLIC PANEL
ALT: 12 U/L (ref 0–44)
AST: 18 U/L (ref 15–41)
Albumin: 2.4 g/dL — ABNORMAL LOW (ref 3.5–5.0)
Alkaline Phosphatase: 76 U/L (ref 38–126)
Anion gap: 11 (ref 5–15)
BUN: 18 mg/dL (ref 8–23)
CO2: 31 mmol/L (ref 22–32)
Calcium: 8.1 mg/dL — ABNORMAL LOW (ref 8.9–10.3)
Chloride: 94 mmol/L — ABNORMAL LOW (ref 98–111)
Creatinine, Ser: 0.6 mg/dL (ref 0.44–1.00)
GFR, Estimated: >60 mL/min (ref >60)
Glucose, Bld: 99 mg/dL (ref 70–99)
Potassium: 3.7 mmol/L (ref 3.5–5.1)
Sodium: 136 mmol/L (ref 135–145)
Total Bilirubin: 0.4 mg/dL (ref 0.3–1.2)
Total Protein: 5.1 g/dL — ABNORMAL LOW (ref 6.5–8.1)

## 2020-12-07 LAB — CBC WITH DIFFERENTIAL/PLATELET
Abs Immature Granulocytes: 0.18 10*3/uL — ABNORMAL HIGH (ref 0.00–0.07)
Basophils Absolute: 0.1 10*3/uL (ref 0.0–0.1)
Basophils Relative: 1 %
Eosinophils Absolute: 0.5 10*3/uL (ref 0.0–0.5)
Eosinophils Relative: 4 %
HCT: 33 % — ABNORMAL LOW (ref 36.0–46.0)
Hemoglobin: 10.8 g/dL — ABNORMAL LOW (ref 12.0–15.0)
Immature Granulocytes: 1 %
Lymphocytes Relative: 8 %
Lymphs Abs: 1.1 10*3/uL (ref 0.7–4.0)
MCH: 29 pg (ref 26.0–34.0)
MCHC: 32.7 g/dL (ref 30.0–36.0)
MCV: 88.7 fL (ref 80.0–100.0)
Monocytes Absolute: 1 10*3/uL (ref 0.1–1.0)
Monocytes Relative: 8 %
Neutro Abs: 10.3 10*3/uL — ABNORMAL HIGH (ref 1.7–7.7)
Neutrophils Relative %: 78 %
Platelets: 310 10*3/uL (ref 150–400)
RBC: 3.72 MIL/uL — ABNORMAL LOW (ref 3.87–5.11)
RDW: 13.9 % (ref 11.5–15.5)
WBC: 13.1 10*3/uL — ABNORMAL HIGH (ref 4.0–10.5)
nRBC: 0 % (ref 0.0–0.2)

## 2020-12-07 LAB — URINE CULTURE: Culture: >=100000 — AB

## 2020-12-07 MED ORDER — VANCOMYCIN HCL 500 MG/100ML IV SOLN
500.0000 mg | INTRAVENOUS | Status: AC
Start: 2020-12-08 — End: 2020-12-11
  Administered 2020-12-08 – 2020-12-11 (×4): 500 mg via INTRAVENOUS
  Filled 2020-12-07 (×4): qty 100

## 2020-12-07 MED ORDER — POTASSIUM CHLORIDE CRYS ER 20 MEQ PO TBCR
40.0000 meq | EXTENDED_RELEASE_TABLET | Freq: Every day | ORAL | Status: DC
  Administered 2020-12-07: 40 meq via ORAL
  Filled 2020-12-07: qty 2

## 2020-12-07 MED ORDER — FUROSEMIDE 10 MG/ML IJ SOLN
60.0000 mg | Freq: Two times a day (BID) | INTRAMUSCULAR | Status: DC
  Administered 2020-12-07 – 2020-12-08 (×2): 60 mg via INTRAVENOUS
  Filled 2020-12-07 (×2): qty 6

## 2020-12-07 NOTE — Care Management Important Message (Signed)
Important Message  Patient Details IM Letter placed in Patient's room for Daughter Pilar Jarvis Name: Ruqayyah Lute MRN: [id number] Date of Birth: August 26, 1924   Medicare Important Message Given:  Yes     Kerin Salen 12/07/2020, 10:43 AM

## 2020-12-07 NOTE — Progress Notes (Signed)
PROGRESS NOTE  Samantha Fitzgerald  KVQ:[id number] DOB: Oct 03, 1924 DOA: 12/03/2020 PCP: System, Provider Not In   Brief Narrative: Samantha Fitzgerald is a 87 y.o. female with medical history significant of COPD 2 L nasal cannula, sick sinus syndrome, Grade 1 diastolic HF, with recurrent large left-sided pleural effusion, recently diagnosed with covid June/July 2022.  Patient presents to the ED from [facility] facility with hypotension general weakness with reportedly acute onset blood pressure noted to be as low as 80/60 with bilateral lower extremity edema.  Assessment & Plan: Active Problems:   Pleural effusion on left   COPD (chronic obstructive pulmonary disease) (HCC)   Sick sinus syndrome (HCC)   Sepsis (HCC)   COVID-19 virus infection   Acute lower UTI   Cellulitis   Chronic diastolic CHF (congestive heart failure) (HCC)   Hypoalbuminemia   AKI (acute kidney injury) ([place])  Severe Sepsis secondary to polymicrobial UTI, POA: Leukocytosis, tachycardia with lactic acidosis = severe sepsis  - Proteus with resistance noted on culture. Changed CTX to cefepime. WBC improving but remains elevated. Also with enterococcus for which ampicillin would work but pt is on vancomycin otherwise anyway.  Cellulitis of left lower extremity: Nonpurulent. - Cefepime and vancomycin being given. Vanc added for GPC's discussed below.   MRSE in blood cultures: 2 aerobic bottles out of 4 total bottles collected on 9/19 from right AC, left forearm. - Continue vancomycin. With ongoing leukocytosis, check echo (last done 09/14/2020) - Repeat blood cultures 9/21 remain NGTD.   Acute on chronic hypoxic respiratory failure: Likely related to symptomatic pleural effusions (R > L) - s/p 800cc red-tinged w/protein <3, cyto pending, with negative gram stain on L thoracentesis 9/21. Thora last done 6/30 with 850cc removed. - Titrate oxygen as needed hopefully down to baseline 2L in coming days. Improving with  diuresis.  - Restarted lasix as below    Acute on chronic diastolic CHF: -6.4P UOP yesterday - Continue lasix, decreased diuretic response 9/22 and Cr stable, so increase to 60mg  IV BID.  - Continue monitoring I/O, weights, Cr.     Acute DVT of the right common femoral vein and SF junction:  - Continue lovenox 1mg /kg q12h.    Sick sinus syndrome: Continue home antiarrhythmics, digoxin (level 1.4) and flecainide.    COPD: Not in exacerbation.  - Continue inhaled therapies.    History of covid-19 infection: Treated with remdesivir and steroids. No pulmonary symptoms, infiltrate on CXR. CT is 42 not consistent with active infection.  - No isolation/Tx required.    Hypokalemia: Improved with supp. - Supplement again today due to ongoing loop diuretic use.  DVT prophylaxis: Lovenox 1mg /kg q12h to restart 9/22. Code Status: DNR Family Communication: Daughter at bedside Disposition Plan:  Status is: Inpatient  Remains inpatient appropriate because:Ongoing diagnostic testing needed not appropriate for outpatient work up and Inpatient level of care appropriate due to severity of illness  Dispo: The patient is from: ALF              Anticipated d/c is to: ALF vs. SNF depending on mobility improvements during admission.              Patient currently is not medically stable to d/c.   Consultants:  IR  Procedures:  U/S-guided thoracentesis on left 12/05/2020 by Dr. Serafina Royals.  Antimicrobials: Vancomycin 9/19, then 9/21 >> Ceftriaxone 9/19 - 9/20 Cefepime 9/19, then 9/21 >>  Subjective: Feels much better today, breathing is easier and oxygen supplementation has come down.  She denies bleeding, chest or other pain. Spent a while up in chair yesterday, willing to do so again today.   Objective: Vitals:   12/07/20 0820 12/07/20 1219 12/07/20 1230 12/07/20 1352  BP:   (!) 132/58   Pulse:   91   Resp:   (!) 22   Temp:   98.1 F (36.7 C)   TempSrc:   Oral   SpO2: 97%  93% 100%   Weight:  57.7 kg    Height:        Intake/Output Summary (Last 24 hours) at 12/07/2020 1449 Last data filed at 12/07/2020 0200 Gross per 24 hour  Intake 353 ml  Output 775 ml  Net -422 ml   Filed Weights   12/04/20 1403 12/05/20 1342 12/07/20 1219  Weight: 59 kg 59 kg 57.7 kg   Gen: 87 y.o. female in no distress Pulm: Nonlabored at rest, diminished at L > R bases with some crackles no wheezes. CV: Regular rate and rhythm. No murmur, rub, or gallop. No JVD GI: Abdomen soft, non-tender, non-distended, with normoactive bowel sounds.  Ext: Warm, no deformities Skin: LE's wrapped with kerlix though tenderness on LLE is improved from prior exam. Legs pictured below from 9/22 exam. Edema is much less and had improved at the time of that picture from prior exams as well.  Neuro: Alert and oriented. No focal neurological deficits. Psych: Judgement and insight appear fair. Mood euthymic & affect congruent. Behavior is appropriate.         Data Reviewed: I have personally reviewed following labs and imaging studies  CBC: Recent Labs  Lab 12/03/20 1058 12/04/20 0420 12/05/20 0335 12/06/20 0355 12/07/20 0341  WBC 24.2* 16.7* 15.8* 12.7* 13.1*  NEUTROABS 22.2* 14.4* 13.3* 10.7* 10.3*  HGB 12.0 10.5* 11.2* 10.6* 10.8*  HCT 37.7 32.7* 34.4* 32.2* 33.0*  MCV 90.8 89.8 88.2 87.7 88.7  PLT 355 321 342 327 811   Basic Metabolic Panel: Recent Labs  Lab 12/03/20 1058 12/03/20 2128 12/04/20 0420 12/05/20 0335 12/06/20 0355 12/07/20 0341  NA 135  --  138 135 138 136  K 4.0  --  4.2 4.0 3.3* 3.7  CL 93*  --  98 95* 97* 94*  CO2 30  --  33* 27 35* 31  GLUCOSE 134*  --  83 63* 105* 99  BUN 17  --  18 17 17 18   CREATININE 1.00  --  0.86 0.77 0.64 0.60  CALCIUM 8.6*  --  8.6* 7.9* 8.2* 8.1*  MG  --  1.8 1.8  --   --   --   PHOS  --  3.8 3.8  --   --   --    GFR: Estimated Creatinine Clearance: 34 mL/min (by C-G formula based on SCr of 0.6 mg/dL). Liver Function Tests: Recent  Labs  Lab 12/03/20 1058 12/04/20 0420 12/05/20 0335 12/06/20 0355 12/07/20 0341  AST 20 20 15  14* 18  ALT 14 12 11 10 12   ALKPHOS 94 78 83 77 76  BILITOT 0.7 0.6 0.9 0.5 0.4  PROT 6.3* 5.7* 5.3* 5.3* 5.1*  ALBUMIN 2.9* 2.7* 2.6* 2.5* 2.4*   No results for input(s): LIPASE, AMYLASE in the last 168 hours. No results for input(s): AMMONIA in the last 168 hours. Coagulation Profile: Recent Labs  Lab 12/03/20 1058 12/04/20 0420  INR 1.0 1.1   Cardiac Enzymes: Recent Labs  Lab 12/03/20 2128  CKTOTAL 31*   BNP (last 3 results) No results for input(s):  PROBNP in the last 8760 hours. HbA1C: No results for input(s): HGBA1C in the last 72 hours. CBG: No results for input(s): GLUCAP in the last 168 hours. Lipid Profile: No results for input(s): CHOL, HDL, LDLCALC, TRIG, CHOLHDL, LDLDIRECT in the last 72 hours. Thyroid Function Tests: No results for input(s): TSH, T4TOTAL, FREET4, T3FREE, THYROIDAB in the last 72 hours.  Anemia Panel: No results for input(s): VITAMINB12, FOLATE, FERRITIN, TIBC, IRON, RETICCTPCT in the last 72 hours.  Urine analysis:    Component Value Date/Time   COLORURINE STRAW (A) 12/03/2020 1458   APPEARANCEUR TURBID (A) 12/03/2020 1458   LABSPEC  12/03/2020 1458    TEST NOT REPORTED DUE TO COLOR INTERFERENCE OF URINE PIGMENT   PHURINE  12/03/2020 1458    TEST NOT REPORTED DUE TO COLOR INTERFERENCE OF URINE PIGMENT   GLUCOSEU (A) 12/03/2020 1458    TEST NOT REPORTED DUE TO COLOR INTERFERENCE OF URINE PIGMENT   HGBUR (A) 12/03/2020 1458    TEST NOT REPORTED DUE TO COLOR INTERFERENCE OF URINE PIGMENT   BILIRUBINUR (A) 12/03/2020 1458    TEST NOT REPORTED DUE TO COLOR INTERFERENCE OF URINE PIGMENT   KETONESUR (A) 12/03/2020 1458    TEST NOT REPORTED DUE TO COLOR INTERFERENCE OF URINE PIGMENT   PROTEINUR (A) 12/03/2020 1458    TEST NOT REPORTED DUE TO COLOR INTERFERENCE OF URINE PIGMENT   NITRITE (A) 12/03/2020 1458    TEST NOT REPORTED DUE TO COLOR  INTERFERENCE OF URINE PIGMENT   LEUKOCYTESUR (A) 12/03/2020 1458    TEST NOT REPORTED DUE TO COLOR INTERFERENCE OF URINE PIGMENT   Recent Results (from the past 240 hour(s))  Culture, blood (Routine x 2)     Status: Abnormal   Collection Time: 12/03/20 10:54 AM   Specimen: BLOOD LEFT FOREARM  Result Value Ref Range Status   Specimen Description   Final    BLOOD LEFT FOREARM Performed at [facility], [place] [address] [place], [place] [address]    Special Requests   Final    BOTTLES DRAWN AEROBIC AND ANAEROBIC Blood Culture adequate volume Performed at [place] [address] [place], [place] [address]    Culture  Setup Time   Final    GRAM POSITIVE COCCI IN CLUSTERS AEROBIC BOTTLE ONLY Organism ID to follow CRITICAL RESULT CALLED TO, READ BACK BY AND VERIFIED WITH: A TUCKER PHARMD 1652 12/04/20 A BROWNING    Culture (A)  Final    STAPHYLOCOCCUS EPIDERMIDIS SUSCEPTIBILITIES PERFORMED ON PREVIOUS CULTURE WITHIN THE LAST 5 DAYS. Performed at [facility], [place] [address] [place] [address], [place], [place] [address]    Report Status 12/07/2020 FINAL  Final  Blood Culture ID Panel (Reflexed)     Status: Abnormal   Collection Time: 12/03/20 10:54 AM  Result Value Ref Range Status   Enterococcus faecalis NOT DETECTED NOT DETECTED Final   Enterococcus Faecium NOT DETECTED NOT DETECTED Final   Listeria monocytogenes NOT DETECTED NOT DETECTED Final   Staphylococcus species DETECTED (A) NOT DETECTED Final    Comment: CRITICAL RESULT CALLED TO, READ BACK BY AND VERIFIED WITH: A TUCKER PHARMD 1652 12/04/20 A BROWNING    Staphylococcus aureus (BCID) NOT DETECTED NOT DETECTED Final   Staphylococcus epidermidis DETECTED (A) NOT DETECTED Final    Comment: Methicillin (oxacillin) resistant coagulase negative staphylococcus. Possible blood culture contaminant (unless isolated from more than one blood culture draw or clinical case suggests pathogenicity). No  antibiotic treatment is indicated for blood  culture  contaminants. CRITICAL RESULT CALLED TO, READ BACK BY AND VERIFIED WITH: A TUCKER PHARMD 1652 12/04/20 A BROWNING    Staphylococcus lugdunensis NOT DETECTED NOT DETECTED Final   Streptococcus species NOT DETECTED NOT DETECTED Final   Streptococcus agalactiae NOT DETECTED NOT DETECTED Final   Streptococcus pneumoniae NOT DETECTED NOT DETECTED Final   Streptococcus pyogenes NOT DETECTED NOT DETECTED Final   A.calcoaceticus-baumannii NOT DETECTED NOT DETECTED Final   Bacteroides fragilis NOT DETECTED NOT DETECTED Final   Enterobacterales NOT DETECTED NOT DETECTED Final   Enterobacter cloacae complex NOT DETECTED NOT DETECTED Final   Escherichia coli NOT DETECTED NOT DETECTED Final   Klebsiella aerogenes NOT DETECTED NOT DETECTED Final   Klebsiella oxytoca NOT DETECTED NOT DETECTED Final   Klebsiella pneumoniae NOT DETECTED NOT DETECTED Final   Proteus species NOT DETECTED NOT DETECTED Final   Salmonella species NOT DETECTED NOT DETECTED Final   Serratia marcescens NOT DETECTED NOT DETECTED Final   Haemophilus influenzae NOT DETECTED NOT DETECTED Final   Neisseria meningitidis NOT DETECTED NOT DETECTED Final   Pseudomonas aeruginosa NOT DETECTED NOT DETECTED Final   Stenotrophomonas maltophilia NOT DETECTED NOT DETECTED Final   Candida albicans NOT DETECTED NOT DETECTED Final   Candida auris NOT DETECTED NOT DETECTED Final   Candida glabrata NOT DETECTED NOT DETECTED Final   Candida krusei NOT DETECTED NOT DETECTED Final   Candida parapsilosis NOT DETECTED NOT DETECTED Final   Candida tropicalis NOT DETECTED NOT DETECTED Final   Cryptococcus neoformans/gattii NOT DETECTED NOT DETECTED Final   Methicillin resistance mecA/C DETECTED (A) NOT DETECTED Final    Comment: CRITICAL RESULT CALLED TO, READ BACK BY AND VERIFIED WITHAustin Miles PHARMD 1652 12/04/20 A BROWNING Performed at [facility], [address] [place], [place]  [address]   Culture, blood (Routine x 2)     Status: Abnormal   Collection Time: 12/03/20 10:59 AM   Specimen: BLOOD  Result Value Ref Range Status   Specimen Description   Final    BLOOD RIGHT ANTECUBITAL Performed at [place] [address], [place], [place] [address]    Special Requests   Final    BOTTLES DRAWN AEROBIC AND ANAEROBIC Blood Culture adequate volume Performed at [place] [address], [place], [place] [address]    Culture  Setup Time   Final    GRAM POSITIVE COCCI IN CLUSTERS AEROBIC BOTTLE ONLY CRITICAL VALUE NOTED.  VALUE IS CONSISTENT WITH PREVIOUSLY REPORTED AND CALLED VALUE. Performed at [facility], [place] [address] [place], [place] [address]    Culture STAPHYLOCOCCUS EPIDERMIDIS (A)  Final   Report Status 12/07/2020 FINAL  Final   Organism ID, Bacteria STAPHYLOCOCCUS EPIDERMIDIS  Final      Susceptibility   Staphylococcus epidermidis - MIC*    CIPROFLOXACIN >=8 RESISTANT Resistant     ERYTHROMYCIN >=8 RESISTANT Resistant     GENTAMICIN >=16 RESISTANT Resistant     OXACILLIN >=4 RESISTANT Resistant     TETRACYCLINE 2 SENSITIVE Sensitive     VANCOMYCIN 4 SENSITIVE Sensitive     TRIMETH/SULFA 80 RESISTANT Resistant     CLINDAMYCIN >=8 RESISTANT Resistant     RIFAMPIN <=0.5 SENSITIVE Sensitive     Inducible Clindamycin NEGATIVE Sensitive     * STAPHYLOCOCCUS EPIDERMIDIS  Resp Panel by RT-PCR (Flu A&B, Covid) Nasopharyngeal Swab     Status: Abnormal   Collection Time: 12/03/20 12:03 PM   Specimen: Nasopharyngeal Swab; Nasopharyngeal(NP) swabs in vial transport medium  Result Value Ref Range Status   SARS Coronavirus 2 by RT PCR POSITIVE (A) NEGATIVE Final    Comment: RESULT CALLED TO, READ BACK BY AND VERIFIED WITH: JACOBS,D. RN @1310  ON 12/03/2020 BY COHEN,K (NOTE) SARS-CoV-2 target nucleic acids are DETECTED.  The SARS-CoV-2 RNA is generally detectable in upper respiratory specimens during the acute  phase of infection. Positive results are indicative of the presence of the identified virus, but do not rule out bacterial infection or co-infection with other pathogens not detected by the test. Clinical correlation with patient history and other diagnostic information is necessary to determine patient infection status. The expected result is Negative.  Fact Sheet for Patients: EntrepreneurPulse.com.au  Fact Sheet for Healthcare Providers: IncredibleEmployment.be  This test is not yet approved or cleared by the [place] FDA and  has been authorized for detection and/or diagnosis of SARS-CoV-2 by FDA under an Emergency Use Authorization (EUA).  This EUA will remain in effect (meaning this t est can be used) for the duration of  the COVID-19 declaration under Section 564(b)(1) of the Act, 21 U.S.C. section 360bbb-3(b)(1), unless the authorization is terminated or revoked sooner.     Influenza A by PCR NEGATIVE NEGATIVE Final   Influenza B by PCR NEGATIVE NEGATIVE Final    Comment: (NOTE) The Xpert Xpress SARS-CoV-2/FLU/RSV plus assay is intended as an aid in the diagnosis of influenza from Nasopharyngeal swab specimens and should not be used as a sole basis for treatment. Nasal washings and aspirates are unacceptable for Xpert Xpress SARS-CoV-2/FLU/RSV testing.  Fact Sheet for Patients: EntrepreneurPulse.com.au  Fact Sheet for Healthcare Providers: IncredibleEmployment.be  This test is not yet approved or cleared by the [place] FDA and has been authorized for detection and/or diagnosis of SARS-CoV-2 by FDA under an Emergency Use Authorization (EUA). This EUA will remain in effect (meaning this test can be used) for the duration of the COVID-19 declaration under Section 564(b)(1) of the Act, 21 U.S.C. section 360bbb-3(b)(1), unless the authorization is terminated or revoked.  Performed at  [facility], [place] [address], [place], [place] [address]   Urine Culture     Status: Abnormal   Collection Time: 12/03/20  2:59 PM   Specimen: Urine, Clean Catch  Result Value Ref Range Status   Specimen Description   Final    URINE, CLEAN CATCH Performed at [facility], [place] [address] [place], [place] [address]    Special Requests   Final    NONE Performed at [facility], [place] [address], [place],  [address]    Culture (A)  Final    >=100,000 COLONIES/mL PROTEUS VULGARIS >=100,000 COLONIES/mL ENTEROCOCCUS FAECALIS    Report Status 12/07/2020 FINAL  Final   Organism ID, Bacteria PROTEUS VULGARIS (A)  Final   Organism ID, Bacteria ENTEROCOCCUS FAECALIS (A)  Final      Susceptibility   Enterococcus faecalis - MIC*    AMPICILLIN <=2 SENSITIVE Sensitive     NITROFURANTOIN <=16 SENSITIVE Sensitive     VANCOMYCIN 1 SENSITIVE Sensitive     * >=100,000 COLONIES/mL ENTEROCOCCUS FAECALIS   Proteus vulgaris - MIC*    AMPICILLIN >=32 RESISTANT Resistant     CEFAZOLIN >=64 RESISTANT Resistant     CEFEPIME <=0.12 SENSITIVE Sensitive     CIPROFLOXACIN <=0.25 SENSITIVE Sensitive     GENTAMICIN <=1 SENSITIVE Sensitive     IMIPENEM 2 SENSITIVE Sensitive     NITROFURANTOIN 128 RESISTANT Resistant     TRIMETH/SULFA <=20 SENSITIVE  Sensitive     AMPICILLIN/SULBACTAM 16 INTERMEDIATE Intermediate     PIP/TAZO <=4 SENSITIVE Sensitive     * >=100,000 COLONIES/mL PROTEUS VULGARIS  MRSA Next Gen by PCR, Nasal     Status: None   Collection Time: 12/04/20  4:06 PM   Specimen: Nasal Mucosa; Nasal Swab  Result Value Ref Range Status   MRSA by PCR Next Gen NOT DETECTED NOT DETECTED Final    Comment: (NOTE) The GeneXpert MRSA Assay (FDA approved for NASAL specimens only), is one component of a comprehensive MRSA colonization surveillance program. It is not intended to diagnose MRSA infection nor to guide or monitor treatment for MRSA  infections. Test performance is not FDA approved in patients less than 39 years old. Performed at [facility], [place] [address], [place], [place] [address]   Culture, blood (routine x 2)     Status: None (Preliminary result)   Collection Time: 12/05/20  7:38 AM   Specimen: BLOOD RIGHT HAND  Result Value Ref Range Status   Specimen Description   Final    BLOOD RIGHT HAND Performed at [place] [address], [place], [place] [address]    Special Requests   Final    BOTTLES DRAWN AEROBIC ONLY Blood Culture adequate volume Performed at [place] [address] [place], [place] [address]    Culture   Final    NO GROWTH 1 DAY Performed at [facility], [place] [address] [place], [place] [address]    Report Status PENDING  Incomplete  Culture, blood (routine x 2)     Status: None (Preliminary result)   Collection Time: 12/05/20  7:38 AM   Specimen: BLOOD  Result Value Ref Range Status   Specimen Description   Final    BLOOD RIGHT ANTECUBITAL Performed at [place] [address], [place], [place] [address]    Special Requests   Final    BOTTLES DRAWN AEROBIC ONLY Blood Culture adequate volume Performed at [place] [address], [place], [place] [address]    Culture   Final    NO GROWTH 1 DAY Performed at [facility], [place] [address] [place], [place] [address]    Report Status PENDING  Incomplete  Body fluid culture w Gram Stain     Status: None (Preliminary result)   Collection Time: 12/05/20  3:27 PM   Specimen: PATH Cytology Pleural fluid  Result Value Ref Range Status   Specimen Description   Final    PLEURAL Performed at [place] [address], [place], [place] [address]    Special Requests   Final    NONE Performed at [facility], [place] [address] [place], [place] [address]    Gram Stain   Final     FEW SQUAMOUS EPITHELIAL CELLS PRESENT FEW WBC SEEN NO ORGANISMS SEEN    Culture   Final    NO GROWTH 2 DAYS Performed at [facility], [address], [place], [place] [address]    Report Status PENDING  Incomplete      Radiology Studies: DG Chest 1 View  Result Date: 12/05/2020 CLINICAL DATA:  Post thoracentesis. EXAM: CHEST  1 VIEW COMPARISON:  Chest x-ray 12/03/2020. FINDINGS: There is a small left pleural effusion which is decreased from the prior examination. There are findings suspicious for tiny left apical pneumothorax. There is no mediastinal shift. Small right pleural effusion  is unchanged from the prior examination. The cardiomediastinal silhouette is stable. Surgical changes overlie the right hilar region. There are atherosclerotic calcifications of the aorta. Osseous structures are within normal limits. IMPRESSION: 1. Tiny left apical pneumothorax. 2. Small left pleural effusion has decreased. 3. Small right pleural effusion is unchanged. 4.  Aortic Atherosclerosis (ICD10-I70.0). Electronically Signed   By: Ronney Asters M.D.   On: 12/05/2020 15:48   US THORACENTESIS ASP PLEURAL SPACE W/IMG GUIDE  Result Date: 12/05/2020 INDICATION: Pleural effusion. EXAM: ULTRASOUND GUIDED DIAGNOSITIC AND THERAPEUTIC THORACENTESIS MEDICATIONS: None. COMPLICATIONS: None immediate. PROCEDURE: An ultrasound guided thoracentesis was thoroughly discussed with the patient and questions answered. The benefits, risks, alternatives and complications were also discussed. The patient understands and wishes to proceed with the procedure. Written consent was obtained. Ultrasound was performed to localize and mark an adequate pocket of fluid in the left chest. The area was then prepped and draped in the normal sterile fashion. 1% Lidocaine was used for local anesthesia. Under ultrasound guidance a 6 Fr Safe-T-Centesis catheter was introduced. Thoracentesis was performed. The catheter was removed and a  dressing applied. FINDINGS: A total of approximately 800 of red-tinged fluid was removed. Samples were sent to the laboratory as requested by the clinical team. IMPRESSION: Successful ultrasound guided diagnostic and therapeutic thoracentesis yielding 800cc of pleural fluid. Electronically Signed   By: Ruthann Cancer M.D.   On: 12/05/2020 15:32    Scheduled Meds:  albuterol  2 puff Inhalation BID   Chlorhexidine Gluconate Cloth  6 each Topical Q0600   digoxin  0.125 mg Oral Daily   enoxaparin (LOVENOX) injection  1 mg/kg Subcutaneous Q12H   feeding supplement  237 mL Oral BID BM   flecainide  75 mg Oral BID   fluticasone furoate-vilanterol  1 puff Inhalation Daily   And   umeclidinium bromide  1 puff Inhalation Daily   furosemide  60 mg Intravenous Q12H   mirtazapine  7.5 mg Oral QHS   multivitamin with minerals  1 tablet Oral Daily   potassium chloride  40 mEq Oral Daily   sodium chloride flush  3 mL Intravenous Q12H   Continuous Infusions:  sodium chloride     ceFEPime (MAXIPIME) IV 2 g (12/07/20 0859)   [START ON 12/08/2020] vancomycin       LOS: 4 days   Time spent: 35 minutes.  Patrecia Pour, MD [facility] [email or web address] 12/07/2020, 2:49 PM

## 2020-12-07 NOTE — Progress Notes (Signed)
Pharmacy Antibiotic Note  Samantha Fitzgerald is a 87 y.o. female admitted on 12/03/2020 with sepsis. Patient is currently on antibiotics for a UTI as well as concern for bacteremia and cellulitis. Urine culture growing proteus vulgaris and enterococcus faecalis; BCID showing MRSE.   Pharmacy has been consulted for vancomycin and cefepime dosing.  Today, 12/07/20 WBC remains slightly elevated but is trending down SCr stable; CrCl ~34 mL/min Afebrile  This is day #3 of vancomycin and cefepime.   Plan: Continue cefepime 2 g IV q12h Decrease vancomycin to 500 mg IV q24h for estimated AUC of 437 Goal vancomycin AUC 400-550.  Monitor renal function, culture data. Repeat blood cultures drawn on 9/21 showing ngtd  Height: 5\' 2"  (157.5 cm) Weight: 57.7 kg (127 lb 1.6 oz) IBW/kg (Calculated) : 50.1  Temp (24hrs), Avg:98.2 F (36.8 C), Min:98 F (36.7 C), Max:98.3 F (36.8 C)  Recent Labs  Lab 12/03/20 1058 12/03/20 1327 12/04/20 0420 12/05/20 0335 12/06/20 0355 12/07/20 0341  WBC 24.2*  --  16.7* 15.8* 12.7* 13.1*  CREATININE 1.00  --  0.86 0.77 0.64 0.60  LATICACIDVEN 2.2* 2.0*  --   --   --   --      Estimated Creatinine Clearance: 34 mL/min (by C-G formula based on SCr of 0.6 mg/dL).    Allergies  Allergen Reactions   Amoxicillin Other (See Comments)    Listed on MAR   Doxycycline Other (See Comments)    Listed on MAR   Levofloxacin Other (See Comments)    Listed on MAR   Morphine And Related Other (See Comments)    Listed on MAR    Antimicrobials this admission: 9/19 Vancomycin x 1, resumed 9/21 >> 9/19 Cefepime x 1, resumed 9/21 >> 9/19 Metronidazole x 1 9/20 Ceftriaxone >> 9/21  Dose adjustments this admission: 9/24 vancomycin 750 mg IV q24h >> 500 mg IV q24h  Microbiology results: 9/19 BCx: aerobic bottle of both sets with GPC in clusters. BCID = Staph epi (mecA/C detected) 9/19 UCx: > 100K Proteus vulgaris, >100K enterococcus faecalis 9/20 MRSA PCR:  negative  9/21 repeat BCx: NGTD  Thank you for allowing pharmacy to be a part of this patient's care.  Lenis Noon, PharmD 12/07/20 12:26 PM

## 2020-12-07 NOTE — Progress Notes (Signed)
ANTICOAGULATION CONSULT NOTE - Initial Consult  Pharmacy Consult for enoxaparin Indication: DVT  Allergies  Allergen Reactions   Amoxicillin Other (See Comments)    Listed on MAR   Doxycycline Other (See Comments)    Listed on MAR   Levofloxacin Other (See Comments)    Listed on MAR   Morphine And Related Other (See Comments)    Listed on [facility]    Patient Measurements: Height: 5\' 2"  (157.5 cm) Weight: 59 kg (130 lb) (admission wt) IBW/kg (Calculated) : 50.1  Vital Signs:    Labs: Recent Labs    12/05/20 0335 12/06/20 0355 12/07/20 0341  HGB 11.2* 10.6* 10.8*  HCT 34.4* 32.2* 33.0*  PLT 342 327 310  CREATININE 0.77 0.64 0.60     Estimated Creatinine Clearance: 34 mL/min (by C-G formula based on SCr of 0.6 mg/dL).   Medical History: History reviewed. No pertinent past medical history.  Medications:  Scheduled:   albuterol  2 puff Inhalation BID   Chlorhexidine Gluconate Cloth  6 each Topical Q0600   digoxin  0.125 mg Oral Daily   enoxaparin (LOVENOX) injection  1 mg/kg Subcutaneous Q12H   feeding supplement  237 mL Oral BID BM   flecainide  75 mg Oral BID   fluticasone furoate-vilanterol  1 puff Inhalation Daily   And   umeclidinium bromide  1 puff Inhalation Daily   furosemide  60 mg Intravenous Q12H   mirtazapine  7.5 mg Oral QHS   multivitamin with minerals  1 tablet Oral Daily   potassium chloride  40 mEq Oral Daily   sodium chloride flush  3 mL Intravenous Q12H   Infusions:   sodium chloride     ceFEPime (MAXIPIME) IV 2 g (12/07/20 0859)   vancomycin Stopped (12/06/20 1335)    Assessment: 87 yo female presenting with hypotension, general weakness, low BP, and bilateral lower extremity edema.  No anticoagulation noted PTA. Found to have acute DVT of the R common femoral vein and SF junction.  Pharmacy consulted to dose enoxaparin for DVT treatment.  Enoxaparin was chosen over heparin to limit the amount of extra fluid patient is receiving due to  edema.   D-dimer 2.88, fibrinogen 529.  INR 1.1 at baseline TBW = 59 kg; BMI 23  Today, 12/07/20 CBC: Hgb low but stable; Plt WNL SCr WNL & stable; CrCl ~34 mL/min Enoxaparin was held on 9/21 PM for red-tinged effusion with thoracentesis; resumed on 9/22 AM  Goal of Therapy:  Anti-Xa level 0.6-1 units/ml 4hrs after LMWH dose given as necessary Monitor platelets by anticoagulation protocol: Yes   Plan:  Continue enoxaparin 1 mg/kg (60 mg) subQ q12h Continue to monitor renal function and adjust dose as needed Monitor CBC, signs of bleeding Follow up on long term anticoagulation plan  Lenis Noon, PharmD 12/07/20 12:10 PM

## 2020-12-08 ENCOUNTER — Inpatient Hospital Stay (HOSPITAL_COMMUNITY): Payer: Medicare Other

## 2020-12-08 DIAGNOSIS — I5032 Chronic diastolic (congestive) heart failure: Secondary | ICD-10-CM | POA: Diagnosis not present

## 2020-12-08 DIAGNOSIS — I471 Supraventricular tachycardia: Secondary | ICD-10-CM | POA: Diagnosis not present

## 2020-12-08 DIAGNOSIS — L03116 Cellulitis of left lower limb: Secondary | ICD-10-CM | POA: Diagnosis not present

## 2020-12-08 DIAGNOSIS — I5031 Acute diastolic (congestive) heart failure: Secondary | ICD-10-CM

## 2020-12-08 DIAGNOSIS — N390 Urinary tract infection, site not specified: Secondary | ICD-10-CM | POA: Diagnosis not present

## 2020-12-08 DIAGNOSIS — N179 Acute kidney failure, unspecified: Secondary | ICD-10-CM | POA: Diagnosis not present

## 2020-12-08 LAB — CBC WITH DIFFERENTIAL/PLATELET
Abs Immature Granulocytes: 0.2 10*3/uL — ABNORMAL HIGH (ref 0.00–0.07)
Basophils Absolute: 0.1 10*3/uL (ref 0.0–0.1)
Basophils Relative: 1 %
Eosinophils Absolute: 0.5 10*3/uL (ref 0.0–0.5)
Eosinophils Relative: 3 %
HCT: 36.4 % (ref 36.0–46.0)
Hemoglobin: 11.6 g/dL — ABNORMAL LOW (ref 12.0–15.0)
Immature Granulocytes: 1 %
Lymphocytes Relative: 9 %
Lymphs Abs: 1.4 10*3/uL (ref 0.7–4.0)
MCH: 28.6 pg (ref 26.0–34.0)
MCHC: 31.9 g/dL (ref 30.0–36.0)
MCV: 89.7 fL (ref 80.0–100.0)
Monocytes Absolute: 0.9 10*3/uL (ref 0.1–1.0)
Monocytes Relative: 6 %
Neutro Abs: 12.3 10*3/uL — ABNORMAL HIGH (ref 1.7–7.7)
Neutrophils Relative %: 80 %
Platelets: 341 10*3/uL (ref 150–400)
RBC: 4.06 MIL/uL (ref 3.87–5.11)
RDW: 14 % (ref 11.5–15.5)
WBC: 15.3 10*3/uL — ABNORMAL HIGH (ref 4.0–10.5)
nRBC: 0 % (ref 0.0–0.2)

## 2020-12-08 LAB — COMPREHENSIVE METABOLIC PANEL
ALT: 17 U/L (ref 0–44)
AST: 28 U/L (ref 15–41)
Albumin: 2.4 g/dL — ABNORMAL LOW (ref 3.5–5.0)
Alkaline Phosphatase: 80 U/L (ref 38–126)
Anion gap: 8 (ref 5–15)
BUN: 24 mg/dL — ABNORMAL HIGH (ref 8–23)
CO2: 37 mmol/L — ABNORMAL HIGH (ref 22–32)
Calcium: 8.7 mg/dL — ABNORMAL LOW (ref 8.9–10.3)
Chloride: 90 mmol/L — ABNORMAL LOW (ref 98–111)
Creatinine, Ser: 0.67 mg/dL (ref 0.44–1.00)
GFR, Estimated: >60 mL/min (ref >60)
Glucose, Bld: 143 mg/dL — ABNORMAL HIGH (ref 70–99)
Potassium: 4.4 mmol/L (ref 3.5–5.1)
Sodium: 135 mmol/L (ref 135–145)
Total Bilirubin: 0.6 mg/dL (ref 0.3–1.2)
Total Protein: 5.6 g/dL — ABNORMAL LOW (ref 6.5–8.1)

## 2020-12-08 LAB — ECHOCARDIOGRAM COMPLETE
Height: 62 in
S' Lateral: 1.9 cm
Weight: 2028.23 oz

## 2020-12-08 MED ORDER — MAGNESIUM SULFATE 2 GM/50ML IV SOLN
2.0000 g | Freq: Once | INTRAVENOUS | Status: AC
  Administered 2020-12-08: 2 g via INTRAVENOUS
  Filled 2020-12-08: qty 50

## 2020-12-08 MED ORDER — LACTATED RINGERS IV SOLN: INTRAVENOUS | Status: DC

## 2020-12-08 MED ORDER — METOPROLOL TARTRATE 5 MG/5ML IV SOLN
INTRAVENOUS | Status: AC
  Filled 2020-12-08: qty 5

## 2020-12-08 MED ORDER — DIGOXIN 0.25 MG/ML IJ SOLN
0.1250 mg | Freq: Once | INTRAMUSCULAR | Status: AC
  Administered 2020-12-08: 0.125 mg via INTRAVENOUS
  Filled 2020-12-08: qty 2

## 2020-12-08 MED ORDER — METOPROLOL TARTRATE 25 MG PO TABS
12.5000 mg | ORAL_TABLET | Freq: Two times a day (BID) | ORAL | Status: DC
  Administered 2020-12-08 – 2020-12-14 (×13): 12.5 mg via ORAL
  Filled 2020-12-08 (×12): qty 1

## 2020-12-08 MED ORDER — LACTATED RINGERS IV BOLUS
250.0000 mL | Freq: Once | INTRAVENOUS | Status: AC
  Administered 2020-12-08: 250 mL via INTRAVENOUS

## 2020-12-08 MED ORDER — METOPROLOL TARTRATE 5 MG/5ML IV SOLN
5.0000 mg | Freq: Once | INTRAVENOUS | Status: AC
  Administered 2020-12-08: 5 mg via INTRAVENOUS

## 2020-12-08 NOTE — Progress Notes (Signed)
Echocardiogram 2D Echocardiogram has been performed.  Oneal Deputy Katianna Mcclenney RDCS 12/08/2020, 12:49 PM

## 2020-12-08 NOTE — TOC Progression Note (Signed)
Transition of Care [facility]) - Progression Note    Patient Details  Name: Samantha Fitzgerald MRN: [id number] Date of Birth: 09/12/1924  Transition of Care [facility]) CM/SW [place], [place] Phone Number: 12/08/2020, 9:47 AM  Clinical Narrative:    CSW spoke with pts daughter Ms. McNeil who states that she would prefer for pt to return to her ALF with [facility] PT services. TOC to reach out to ALF to see if they feel they can manage pts return to facility. If they cannot handle return speak with Ms. McNeil to work on plan for SNF possibly. TOC to follow.   Expected Discharge Plan: Assisted Living Barriers to Discharge: No Barriers Identified  Expected Discharge Plan and Services Expected Discharge Plan: Assisted Living       Living arrangements for the past 2 months: Assisted Living Facility                                       Social Determinants of Health (SDOH) Interventions    Readmission Risk Interventions No flowsheet data found.

## 2020-12-08 NOTE — Progress Notes (Signed)
PROGRESS NOTE  Samantha Fitzgerald  UTM:[id number] DOB: 1924/05/18 DOA: 12/03/2020 PCP: System, Provider Not In   Brief Narrative: Samantha Fitzgerald is a 87 y.o. female with medical history significant of COPD 2 L nasal cannula, sick sinus syndrome and SVT, Grade 1 diastolic HF, with recurrent large left-sided pleural effusion, recently diagnosed with covid June/July 2022.  Patient presents to the ED from [facility] facility with hypotension general weakness with reportedly acute onset blood pressure noted to be as low as 80/60 with bilateral lower extremity edema and worsening hypoxic respiratory failure. She was found to have severe sepsis with Proteus UTI, LLE cellulitis, and volume overload with pleural effusion. Thoracentesis was performed and diuresis begun with improvement in respiratory status. On 9/24, the patient developed SVT terminated with metoprolol IV.  Assessment & Plan: Active Problems:   Pleural effusion on left   COPD (chronic obstructive pulmonary disease) (HCC)   Sick sinus syndrome (HCC)   Sepsis ([place])   COVID-19 virus infection   Acute lower UTI   Cellulitis   Chronic diastolic CHF (congestive heart failure) (HCC)   Hypoalbuminemia   AKI (acute kidney injury) ([place])  Severe sepsis secondary to polymicrobial UTI, POA: Leukocytosis, tachycardia with lactic acidosis = severe sepsis  - Proteus with resistance noted on culture. Changed CTX to cefepime. WBC improving but remains elevated. Also with enterococcus for which ampicillin would work but pt is on vancomycin otherwise anyway.  Cellulitis of left lower extremity: Nonpurulent. - Cefepime and vancomycin being given. Vanc added for GPC's discussed below.   MRSE in blood cultures: 2 aerobic bottles out of 4 total bottles collected on 9/19 from right AC, left forearm. - Continue vancomycin. With ongoing leukocytosis, no vegetations on echo. - Repeat blood cultures 9/21 remain NGTD.   Acute on chronic hypoxic  respiratory failure: Likely related to symptomatic pleural effusions (R > L) - s/p 800cc red-tinged w/protein <3, cyto pending, with negative gram stain on L thoracentesis 9/21. Thora last done 6/30 with 850cc removed. - Titrate oxygen as needed, improved near baseline with diuresis.     Acute on chronic diastolic CHF: Incomplete I/O but continues to have diuresis. Suspect with rising BUN, contraction alkalosis, and rising HR we've reached as euvolemic as we'll get.  - Stop lasix today (got it ~5am). Will give low rate IV fluids given her persistent sinus tachycardia in 120's and soft BP and SVT.  - Continue monitoring I/O, weights, Cr.     Acute DVT of the right common femoral vein and SF junction:  - Continue lovenox 1mg /kg q12h.    SVT:  - We/ve continued home antiarrhythmics, digoxin (level 1.4) and flecainide.  - Metoprolol was held due to hypotension initially, though pt developed SVT 9/24 terminated with IV metoprolol. Will restart low dose po metoprolol.  - Discussed with cardiology, Dr. Marlou Porch.  - Give Mg, check in AM.   COPD: Not in exacerbation.  - Continue inhaled therapies.    History of covid-19 infection: Treated with remdesivir and steroids. No pulmonary symptoms, infiltrate on CXR. CT is 42 not consistent with active infection.  - No isolation/Tx required.    Hypokalemia: Improved with supp. - Continue monitoring.   DVT prophylaxis: Lovenox 1mg /kg q12h   Code Status: DNR Family Communication: Daughter at bedside Disposition Plan:  Status is: Inpatient  Remains inpatient appropriate because:Ongoing diagnostic testing needed not appropriate for outpatient work up and Inpatient level of care appropriate due to severity of illness  Dispo: The patient is from:  ALF              Anticipated d/c is to: ALF vs. SNF depending on mobility improvements during admission.              Patient currently is not medically stable to d/c.   Consultants:  IR  Procedures:   U/S-guided thoracentesis on left 12/05/2020 by Dr. Serafina Royals.  Antimicrobials: Vancomycin 9/19, then 9/21 >> Ceftriaxone 9/19 - 9/20 Cefepime 9/19, then 9/21 >>  Subjective: Has been very tired, drowsy, short of breath with palpitations as she's had in the past with SVT. Confirmed to be in SVT, given metoprolol, now complaints mostly of dry mouth. Breathing easier. No chest pain or fever. Not eating much.  Objective: Vitals:   12/08/20 0937 12/08/20 0938 12/08/20 1029 12/08/20 1222  BP:   (!) 97/57 (!) 99/57  Pulse:  (!) 113 (!) 113 (!) 126  Resp:    18  Temp:    97.6 F (36.4 C)  TempSrc:    Oral  SpO2: 93%  93% 94%  Weight:      Height:        Intake/Output Summary (Last 24 hours) at 12/08/2020 1346 Last data filed at 12/08/2020 1200 Gross per 24 hour  Intake 613 ml  Output 1600 ml  Net -987 ml   Filed Weights   12/05/20 1342 12/07/20 1219 12/08/20 0500  Weight: 59 kg 57.7 kg 57.5 kg   Gen: 87 y.o. female in no distress Pulm: Nonlabored but tachypneic with SpO2 90% on 3L O2 crackles resolved. No wheezes. CV: Regular tachycardia initially HR in 140's, now down to 110. Telemetry (personal review) shows SVT this morning terminated and now sinus tachycardia. No  murmur, rub, or gallop. No JVD, 1+ (improved) dependent edema. GI: Abdomen soft, non-tender, non-distended, with normoactive bowel sounds.  Ext: Warm, no deformities Skin: No other/new rashes, lesions or ulcers on visualized skin. Wrap not taken down today, remains c/d/I on bilateral legs. Neuro: Alert and oriented. No focal neurological deficits. Psych: Judgement and insight appear fair. Mood euthymic & affect congruent. Behavior is appropriate.         Data Reviewed: I have personally reviewed following labs and imaging studies  CBC: Recent Labs  Lab 12/04/20 0420 12/05/20 0335 12/06/20 0355 12/07/20 0341 12/08/20 0354  WBC 16.7* 15.8* 12.7* 13.1* 15.3*  NEUTROABS 14.4* 13.3* 10.7* 10.3* 12.3*  HGB  10.5* 11.2* 10.6* 10.8* 11.6*  HCT 32.7* 34.4* 32.2* 33.0* 36.4  MCV 89.8 88.2 87.7 88.7 89.7  PLT 321 342 327 310 678   Basic Metabolic Panel: Recent Labs  Lab 12/03/20 2128 12/04/20 0420 12/05/20 0335 12/06/20 0355 12/07/20 0341 12/08/20 0354  NA  --  138 135 138 136 135  K  --  4.2 4.0 3.3* 3.7 4.4  CL  --  98 95* 97* 94* 90*  CO2  --  33* 27 35* 31 37*  GLUCOSE  --  83 63* 105* 99 143*  BUN  --  18 17 17 18  24*  CREATININE  --  0.86 0.77 0.64 0.60 0.67  CALCIUM  --  8.6* 7.9* 8.2* 8.1* 8.7*  MG 1.8 1.8  --   --   --   --   PHOS 3.8 3.8  --   --   --   --    GFR: Estimated Creatinine Clearance: 34 mL/min (by C-G formula based on SCr of 0.67 mg/dL). Liver Function Tests: Recent Labs  Lab 12/04/20 0420 12/05/20 0335 12/06/20  9242 12/07/20 0341 12/08/20 0354  AST 20 15 14* 18 28  ALT 12 11 10 12 17   ALKPHOS 78 83 77 76 80  BILITOT 0.6 0.9 0.5 0.4 0.6  PROT 5.7* 5.3* 5.3* 5.1* 5.6*  ALBUMIN 2.7* 2.6* 2.5* 2.4* 2.4*   No results for input(s): LIPASE, AMYLASE in the last 168 hours. No results for input(s): AMMONIA in the last 168 hours. Coagulation Profile: Recent Labs  Lab 12/03/20 1058 12/04/20 0420  INR 1.0 1.1   Cardiac Enzymes: Recent Labs  Lab 12/03/20 2128  CKTOTAL 31*   BNP (last 3 results) No results for input(s): PROBNP in the last 8760 hours. HbA1C: No results for input(s): HGBA1C in the last 72 hours. CBG: No results for input(s): GLUCAP in the last 168 hours. Lipid Profile: No results for input(s): CHOL, HDL, LDLCALC, TRIG, CHOLHDL, LDLDIRECT in the last 72 hours. Thyroid Function Tests: No results for input(s): TSH, T4TOTAL, FREET4, T3FREE, THYROIDAB in the last 72 hours.  Anemia Panel: No results for input(s): VITAMINB12, FOLATE, FERRITIN, TIBC, IRON, RETICCTPCT in the last 72 hours.  Urine analysis:    Component Value Date/Time   COLORURINE STRAW (A) 12/03/2020 1458   APPEARANCEUR TURBID (A) 12/03/2020 1458   LABSPEC  12/03/2020  1458    TEST NOT REPORTED DUE TO COLOR INTERFERENCE OF URINE PIGMENT   PHURINE  12/03/2020 1458    TEST NOT REPORTED DUE TO COLOR INTERFERENCE OF URINE PIGMENT   GLUCOSEU (A) 12/03/2020 1458    TEST NOT REPORTED DUE TO COLOR INTERFERENCE OF URINE PIGMENT   HGBUR (A) 12/03/2020 1458    TEST NOT REPORTED DUE TO COLOR INTERFERENCE OF URINE PIGMENT   BILIRUBINUR (A) 12/03/2020 1458    TEST NOT REPORTED DUE TO COLOR INTERFERENCE OF URINE PIGMENT   KETONESUR (A) 12/03/2020 1458    TEST NOT REPORTED DUE TO COLOR INTERFERENCE OF URINE PIGMENT   PROTEINUR (A) 12/03/2020 1458    TEST NOT REPORTED DUE TO COLOR INTERFERENCE OF URINE PIGMENT   NITRITE (A) 12/03/2020 1458    TEST NOT REPORTED DUE TO COLOR INTERFERENCE OF URINE PIGMENT   LEUKOCYTESUR (A) 12/03/2020 1458    TEST NOT REPORTED DUE TO COLOR INTERFERENCE OF URINE PIGMENT   Recent Results (from the past 240 hour(s))  Culture, blood (Routine x 2)     Status: Abnormal   Collection Time: 12/03/20 10:54 AM   Specimen: BLOOD LEFT FOREARM  Result Value Ref Range Status   Specimen Description   Final    BLOOD LEFT FOREARM Performed at [facility], [place] [address] [place], [place] [address]    Special Requests   Final    BOTTLES DRAWN AEROBIC AND ANAEROBIC Blood Culture adequate volume Performed at [place] [address], [place], [place] [address]    Culture  Setup Time   Final    GRAM POSITIVE COCCI IN CLUSTERS AEROBIC BOTTLE ONLY Organism ID to follow CRITICAL RESULT CALLED TO, READ BACK BY AND VERIFIED WITH: A TUCKER PHARMD 1652 12/04/20 A BROWNING    Culture (A)  Final    STAPHYLOCOCCUS EPIDERMIDIS SUSCEPTIBILITIES PERFORMED ON PREVIOUS CULTURE WITHIN THE LAST 5 DAYS. Performed at [facility], [place] [address], [place], [place] [address]    Report Status 12/07/2020 FINAL  Final  Blood Culture ID Panel (Reflexed)     Status: Abnormal   Collection Time: 12/03/20 10:54 AM  Result  Value Ref Range Status   Enterococcus faecalis NOT DETECTED NOT  DETECTED Final   Enterococcus Faecium NOT DETECTED NOT DETECTED Final   Listeria monocytogenes NOT DETECTED NOT DETECTED Final   Staphylococcus species DETECTED (A) NOT DETECTED Final    Comment: CRITICAL RESULT CALLED TO, READ BACK BY AND VERIFIED WITH: A TUCKER PHARMD 1652 12/04/20 A BROWNING    Staphylococcus aureus (BCID) NOT DETECTED NOT DETECTED Final   Staphylococcus epidermidis DETECTED (A) NOT DETECTED Final    Comment: Methicillin (oxacillin) resistant coagulase negative staphylococcus. Possible blood culture contaminant (unless isolated from more than one blood culture draw or clinical case suggests pathogenicity). No antibiotic treatment is indicated for blood  culture contaminants. CRITICAL RESULT CALLED TO, READ BACK BY AND VERIFIED WITH: A TUCKER PHARMD 1652 12/04/20 A BROWNING    Staphylococcus lugdunensis NOT DETECTED NOT DETECTED Final   Streptococcus species NOT DETECTED NOT DETECTED Final   Streptococcus agalactiae NOT DETECTED NOT DETECTED Final   Streptococcus pneumoniae NOT DETECTED NOT DETECTED Final   Streptococcus pyogenes NOT DETECTED NOT DETECTED Final   A.calcoaceticus-baumannii NOT DETECTED NOT DETECTED Final   Bacteroides fragilis NOT DETECTED NOT DETECTED Final   Enterobacterales NOT DETECTED NOT DETECTED Final   Enterobacter cloacae complex NOT DETECTED NOT DETECTED Final   Escherichia coli NOT DETECTED NOT DETECTED Final   Klebsiella aerogenes NOT DETECTED NOT DETECTED Final   Klebsiella oxytoca NOT DETECTED NOT DETECTED Final   Klebsiella pneumoniae NOT DETECTED NOT DETECTED Final   Proteus species NOT DETECTED NOT DETECTED Final   Salmonella species NOT DETECTED NOT DETECTED Final   Serratia marcescens NOT DETECTED NOT DETECTED Final   Haemophilus influenzae NOT DETECTED NOT DETECTED Final   Neisseria meningitidis NOT DETECTED NOT DETECTED Final   Pseudomonas aeruginosa NOT DETECTED NOT  DETECTED Final   Stenotrophomonas maltophilia NOT DETECTED NOT DETECTED Final   Candida albicans NOT DETECTED NOT DETECTED Final   Candida auris NOT DETECTED NOT DETECTED Final   Candida glabrata NOT DETECTED NOT DETECTED Final   Candida krusei NOT DETECTED NOT DETECTED Final   Candida parapsilosis NOT DETECTED NOT DETECTED Final   Candida tropicalis NOT DETECTED NOT DETECTED Final   Cryptococcus neoformans/gattii NOT DETECTED NOT DETECTED Final   Methicillin resistance mecA/C DETECTED (A) NOT DETECTED Final    Comment: CRITICAL RESULT CALLED TO, READ BACK BY AND VERIFIED WITHAustin Miles PHARMD 1652 12/04/20 A BROWNING Performed at [facility], [address] [place], [place] [address]   Culture, blood (Routine x 2)     Status: Abnormal   Collection Time: 12/03/20 10:59 AM   Specimen: BLOOD  Result Value Ref Range Status   Specimen Description   Final    BLOOD RIGHT ANTECUBITAL Performed at [place] [address] [place], [place] [address]    Special Requests   Final    BOTTLES DRAWN AEROBIC AND ANAEROBIC Blood Culture adequate volume Performed at [place] [address] [place], [place] [address]    Culture  Setup Time   Final    GRAM POSITIVE COCCI IN CLUSTERS AEROBIC BOTTLE ONLY CRITICAL VALUE NOTED.  VALUE IS CONSISTENT WITH PREVIOUSLY REPORTED AND CALLED VALUE. Performed at [facility], [place] [address] [place], [place] [address]    Culture STAPHYLOCOCCUS EPIDERMIDIS (A)  Final   Report Status 12/07/2020 FINAL  Final   Organism ID, Bacteria STAPHYLOCOCCUS EPIDERMIDIS  Final      Susceptibility   Staphylococcus epidermidis - MIC*    CIPROFLOXACIN >=8 RESISTANT Resistant     ERYTHROMYCIN >=8  RESISTANT Resistant     GENTAMICIN >=16 RESISTANT Resistant     OXACILLIN >=4 RESISTANT Resistant     TETRACYCLINE 2 SENSITIVE Sensitive     VANCOMYCIN 4 SENSITIVE Sensitive     TRIMETH/SULFA 80 RESISTANT Resistant      CLINDAMYCIN >=8 RESISTANT Resistant     RIFAMPIN <=0.5 SENSITIVE Sensitive     Inducible Clindamycin NEGATIVE Sensitive     * STAPHYLOCOCCUS EPIDERMIDIS  Resp Panel by RT-PCR (Flu A&B, Covid) Nasopharyngeal Swab     Status: Abnormal   Collection Time: 12/03/20 12:03 PM   Specimen: Nasopharyngeal Swab; Nasopharyngeal(NP) swabs in vial transport medium  Result Value Ref Range Status   SARS Coronavirus 2 by RT PCR POSITIVE (A) NEGATIVE Final    Comment: RESULT CALLED TO, READ BACK BY AND VERIFIED WITH: JACOBS,D. RN @1310  ON 12/03/2020 BY COHEN,K (NOTE) SARS-CoV-2 target nucleic acids are DETECTED.  The SARS-CoV-2 RNA is generally detectable in upper respiratory specimens during the acute phase of infection. Positive results are indicative of the presence of the identified virus, but do not rule out bacterial infection or co-infection with other pathogens not detected by the test. Clinical correlation with patient history and other diagnostic information is necessary to determine patient infection status. The expected result is Negative.  Fact Sheet for Patients: EntrepreneurPulse.com.au  Fact Sheet for Healthcare Providers: IncredibleEmployment.be  This test is not yet approved or cleared by the [place] FDA and  has been authorized for detection and/or diagnosis of SARS-CoV-2 by FDA under an Emergency Use Authorization (EUA).  This EUA will remain in effect (meaning this t est can be used) for the duration of  the COVID-19 declaration under Section 564(b)(1) of the Act, 21 U.S.C. section 360bbb-3(b)(1), unless the authorization is terminated or revoked sooner.     Influenza A by PCR NEGATIVE NEGATIVE Final   Influenza B by PCR NEGATIVE NEGATIVE Final    Comment: (NOTE) The Xpert Xpress SARS-CoV-2/FLU/RSV plus assay is intended as an aid in the diagnosis of influenza from Nasopharyngeal swab specimens and should not be used as a sole  basis for treatment. Nasal washings and aspirates are unacceptable for Xpert Xpress SARS-CoV-2/FLU/RSV testing.  Fact Sheet for Patients: EntrepreneurPulse.com.au  Fact Sheet for Healthcare Providers: IncredibleEmployment.be  This test is not yet approved or cleared by the [place] FDA and has been authorized for detection and/or diagnosis of SARS-CoV-2 by FDA under an Emergency Use Authorization (EUA). This EUA will remain in effect (meaning this test can be used) for the duration of the COVID-19 declaration under Section 564(b)(1) of the Act, 21 U.S.C. section 360bbb-3(b)(1), unless the authorization is terminated or revoked.  Performed at [facility], [place] [address] [place], [place] [address]   Urine Culture     Status: Abnormal   Collection Time: 12/03/20  2:59 PM   Specimen: Urine, Clean Catch  Result Value Ref Range Status   Specimen Description   Final    URINE, CLEAN CATCH Performed at [facility], [place] [address], [place], [place] [address]    Special Requests   Final    NONE Performed at [facility], [place] [address] [place], [place] [address]    Culture (A)  Final    >=100,000 COLONIES/mL PROTEUS VULGARIS >=100,000 COLONIES/mL ENTEROCOCCUS FAECALIS    Report Status 12/07/2020 FINAL  Final   Organism ID, Bacteria PROTEUS VULGARIS (A)  Final   Organism ID, Bacteria ENTEROCOCCUS FAECALIS (A)  Final  Susceptibility   Enterococcus faecalis - MIC*    AMPICILLIN <=2 SENSITIVE Sensitive     NITROFURANTOIN <=16 SENSITIVE Sensitive     VANCOMYCIN 1 SENSITIVE Sensitive     * >=100,000 COLONIES/mL ENTEROCOCCUS FAECALIS   Proteus vulgaris - MIC*    AMPICILLIN >=32 RESISTANT Resistant     CEFAZOLIN >=64 RESISTANT Resistant     CEFEPIME <=0.12 SENSITIVE Sensitive     CIPROFLOXACIN <=0.25 SENSITIVE Sensitive     GENTAMICIN <=1 SENSITIVE Sensitive     IMIPENEM 2  SENSITIVE Sensitive     NITROFURANTOIN 128 RESISTANT Resistant     TRIMETH/SULFA <=20 SENSITIVE Sensitive     AMPICILLIN/SULBACTAM 16 INTERMEDIATE Intermediate     PIP/TAZO <=4 SENSITIVE Sensitive     * >=100,000 COLONIES/mL PROTEUS VULGARIS  MRSA Next Gen by PCR, Nasal     Status: None   Collection Time: 12/04/20  4:06 PM   Specimen: Nasal Mucosa; Nasal Swab  Result Value Ref Range Status   MRSA by PCR Next Gen NOT DETECTED NOT DETECTED Final    Comment: (NOTE) The GeneXpert MRSA Assay (FDA approved for NASAL specimens only), is one component of a comprehensive MRSA colonization surveillance program. It is not intended to diagnose MRSA infection nor to guide or monitor treatment for MRSA infections. Test performance is not FDA approved in patients less than 10 years old. Performed at [facility], [place] [address] [place], [place] [address]   Culture, blood (routine x 2)     Status: None (Preliminary result)   Collection Time: 12/05/20  7:38 AM   Specimen: BLOOD RIGHT HAND  Result Value Ref Range Status   Specimen Description   Final    BLOOD RIGHT HAND Performed at [place] [address], [place], [place] [address]    Special Requests   Final    BOTTLES DRAWN AEROBIC ONLY Blood Culture adequate volume Performed at [place] [address], [place], [place] [address]    Culture   Final    NO GROWTH 3 DAYS Performed at [facility], [place] [address] [place], [place] [address]    Report Status PENDING  Incomplete  Culture, blood (routine x 2)     Status: None (Preliminary result)   Collection Time: 12/05/20  7:38 AM   Specimen: BLOOD  Result Value Ref Range Status   Specimen Description   Final    BLOOD RIGHT ANTECUBITAL Performed at [place] [address], [place], [place] [address]    Special Requests   Final    BOTTLES DRAWN AEROBIC ONLY Blood Culture adequate  volume Performed at [place] [address], [place], [place] [address]    Culture   Final    NO GROWTH 3 DAYS Performed at [facility], [place] [address] [place], [place] [address]    Report Status PENDING  Incomplete  Body fluid culture w Gram Stain     Status: None (Preliminary result)   Collection Time: 12/05/20  3:27 PM   Specimen: PATH Cytology Pleural fluid  Result Value Ref Range Status   Specimen Description   Final    PLEURAL Performed at [place] [address] [place], [place] [address]    Special Requests   Final    NONE Performed at [facility], [place] [address], [place],  [address]    Gram Stain   Final    FEW SQUAMOUS  EPITHELIAL CELLS PRESENT FEW WBC SEEN NO ORGANISMS SEEN    Culture   Final    NO GROWTH 3 DAYS Performed at [facility], [place] [address] [place], [place] [address]    Report Status PENDING  Incomplete      Radiology Studies: ECHOCARDIOGRAM COMPLETE  Result Date: 12/08/2020    ECHOCARDIOGRAM REPORT   Patient Name:   RAYA MCKINSTRY Marchi Date of Exam: 12/08/2020 Medical Rec #:  [id number]              Height:       62.0 in Accession #:    [id number]             Weight:       126.8 lb Date of Birth:  01/05/1925               BSA:          1.575 m Patient Age:    18 years               BP:           97/57 mmHg Patient Gender: F                      HR:           120 bpm. Exam Location:  Inpatient Procedure: 2D Echo, Color Doppler and Cardiac Doppler Indications:    G18.29 Acute diastolic (congestive) heart failure  History:        Patient has prior history of Echocardiogram examinations, most                 recent 09/14/2020. CHF; COPD. Recent COVID Infection.  Sonographer:    Raquel Sarna Senior RDCS Referring Phys: [id number] Patrecia Pour  Sonographer Comments: Technically difficult due to COPD IMPRESSIONS  1. ? patient in rapid flutter during exam . Left ventricular ejection  fraction, by estimation, is 60 to 65%. The left ventricle has normal function. The left ventricle has no regional wall motion abnormalities. Left ventricular diastolic parameters are indeterminate.  2. Right ventricular systolic function is normal. The right ventricular size is normal.  3. The mitral valve is normal in structure. No evidence of mitral valve regurgitation. No evidence of mitral stenosis.  4. The aortic valve is tricuspid. Aortic valve regurgitation is not visualized. Mild to moderate aortic valve sclerosis/calcification is present, without any evidence of aortic stenosis.  5. The inferior vena cava is normal in size with greater than 50% respiratory variability, suggesting right atrial pressure of 3 mmHg. FINDINGS  Left Ventricle: ? patient in rapid flutter during exam. Left ventricular ejection fraction, by estimation, is 60 to 65%. The left ventricle has normal function. The left ventricle has no regional wall motion abnormalities. The left ventricular internal cavity size was normal in size. There is no left ventricular hypertrophy. Left ventricular diastolic parameters are indeterminate. Right Ventricle: The right ventricular size is normal. No increase in right ventricular wall thickness. Right ventricular systolic function is normal. Left Atrium: Left atrial size was normal in size. Right Atrium: Right atrial size was normal in size. Pericardium: There is no evidence of pericardial effusion. Mitral Valve: The mitral valve is normal in structure. No evidence of mitral valve regurgitation. No evidence of mitral valve stenosis. Tricuspid Valve: The tricuspid valve is normal in structure. Tricuspid valve regurgitation is trivial. No evidence of tricuspid stenosis. Aortic Valve: The aortic valve is tricuspid. Aortic valve regurgitation is not visualized.  Mild to moderate aortic valve sclerosis/calcification is present, without any evidence of aortic stenosis. Pulmonic Valve: The pulmonic valve was  normal in structure. Pulmonic valve regurgitation is not visualized. No evidence of pulmonic stenosis. Aorta: The aortic root is normal in size and structure. Venous: The inferior vena cava is normal in size with greater than 50% respiratory variability, suggesting right atrial pressure of 3 mmHg. IAS/Shunts: The interatrial septum was not well visualized.  LEFT VENTRICLE PLAX 2D LVIDd:         2.90 cm LVIDs:         1.90 cm LV PW:         0.90 cm LV IVS:        1.00 cm LVOT diam:     1.80 cm LV SV:         34 LV SV Index:   22 LVOT Area:     2.54 cm  RIGHT VENTRICLE RV S prime:     7.83 cm/s TAPSE (M-mode): 1.0 cm LEFT ATRIUM           Index       RIGHT ATRIUM           Index LA diam:      3.20 cm 2.03 cm/m  RA Area:     10.90 cm LA Vol (A2C): 17.8 ml 11.30 ml/m RA Volume:   21.00 ml  13.33 ml/m LA Vol (A4C): 44.3 ml 28.13 ml/m  AORTIC VALVE LVOT Vmax:   69.80 cm/s LVOT Vmean:  56.700 cm/s LVOT VTI:    0.135 m  AORTA Ao Root diam: 3.20 cm  SHUNTS Systemic VTI:  0.14 m Systemic Diam: 1.80 cm Jenkins Rouge MD Electronically signed by Jenkins Rouge MD Signature Date/Time: 12/08/2020/1:13:34 PM    Final     Scheduled Meds:  albuterol  2 puff Inhalation BID   Chlorhexidine Gluconate Cloth  6 each Topical Q0600   digoxin  0.125 mg Oral Daily   enoxaparin (LOVENOX) injection  1 mg/kg Subcutaneous Q12H   feeding supplement  237 mL Oral BID BM   flecainide  75 mg Oral BID   fluticasone furoate-vilanterol  1 puff Inhalation Daily   And   umeclidinium bromide  1 puff Inhalation Daily   metoprolol tartrate       metoprolol tartrate  12.5 mg Oral BID   mirtazapine  7.5 mg Oral QHS   multivitamin with minerals  1 tablet Oral Daily   sodium chloride flush  3 mL Intravenous Q12H   Continuous Infusions:  sodium chloride     ceFEPime (MAXIPIME) IV 2 g (12/08/20 0810)   lactated ringers     lactated ringers     vancomycin 500 mg (12/08/20 1239)     LOS: 5 days   Time spent: 35 minutes.  Patrecia Pour,  MD [facility] [email or web address] 12/08/2020, 1:46 PM

## 2020-12-08 NOTE — Consult Note (Signed)
Cardiology Consultation:   Patient ID: Samantha Fitzgerald MRN: [id number]; DOB: 14-Jun-1924  Admit date: 12/03/2020 Date of Consult: 12/08/2020  PCP:  System, Provider Not In   [facility] Providers Cardiologist:  [facility]   Patient Profile:   Samantha Fitzgerald is a 87 y.o. female with a hx of PSVT who is being seen 12/08/2020 for the evaluation of PSVT at the request of Dr Vilma Meckel.  History of Present Illness:   Samantha Fitzgerald 87 yo female followed by [facility] cardiology. History of PSVT vs junctional tachycardia per there records, has been on flecanide, metoprolol, digoxin per 04/2020 note. From notes there were discussions about EP study and ablation but mutual decision between provider and patient not to pursue. They were trying to avoid amio if possible given her COPD but would consider.    Admitted with severe sepsis secondary to UTI, lactic acidosis as well as cellulitis left leg. MRSE in blood cultures. Admission also complicated by pleural effusions, she is s/p thoracentesis and acute on chronic diastolic HF. Being treated for DVT right common femoral artery. She was COVID + on admission not thought to be acute infection, she had prior infection June/July thought to be persistent + test   In this setting has had issues with her chronic SVT. Metoprolol had been held earlier on admission due to hypotension, flecanide and digoxin continued.    07/2020 echo [facility] Left Ventricle  Left ventricle size is normal. Wall thickness is normal. Systolic function is normal. EF: 55-60%. Wall motion is normal. Impaired relaxation pattern consistent with age related changes in LV filling.   Right Ventricle  Right ventricle is borderlin dilated. Systolic function is low normal.   Left Atrium  Left atrium is small.   Right Atrium  Normal sized right atrium.   IVC/SVC  The inferior vena cava demonstrates a diameter of <=2.1 cm and collapses >50%; therefore, the right atrial  pressure is estimated at 3 mmHg.   Mitral Valve  Mitral valve structure is normal. There is no mitral regurgitation. There is no evidence of mitral valve stenosis.   Tricuspid Valve  Tricuspid valve structure is normal. There is trace regurgitation. The right ventricular systolic pressure is severely elevated (>60 mmHg).   Aortic Valve  The aortic valve was not well visualized. The leaflets are not thickened. There is no regurgitation or stenosis.   Pulmonic Valve  The pulmonic valve was not well visualized.   Ascending Aorta  The aortic root is normal in size.   Pericardium  There is no pericardial effusion.      History reviewed. No pertinent past medical history.  History reviewed. No pertinent surgical history.    Inpatient Medications: Scheduled Meds:  albuterol  2 puff Inhalation BID   Chlorhexidine Gluconate Cloth  6 each Topical Q0600   digoxin  0.125 mg Oral Daily   enoxaparin (LOVENOX) injection  1 mg/kg Subcutaneous Q12H   feeding supplement  237 mL Oral BID BM   flecainide  75 mg Oral BID   fluticasone furoate-vilanterol  1 puff Inhalation Daily   And   umeclidinium bromide  1 puff Inhalation Daily   metoprolol tartrate       metoprolol tartrate  12.5 mg Oral BID   mirtazapine  7.5 mg Oral QHS   multivitamin with minerals  1 tablet Oral Daily   sodium chloride flush  3 mL Intravenous Q12H   Continuous Infusions:  sodium chloride     ceFEPime (MAXIPIME) IV 2 g (12/08/20  9242)   lactated ringers 75 mL/hr at 12/08/20 1406   magnesium sulfate bolus IVPB     vancomycin 500 mg (12/08/20 1239)   PRN Meds: sodium chloride, acetaminophen **OR** acetaminophen, albuterol, sodium chloride flush  Allergies:    Allergies  Allergen Reactions   Amoxicillin Other (See Comments)    Listed on MAR   Doxycycline Other (See Comments)    Listed on MAR   Levofloxacin Other (See Comments)    Listed on MAR   Morphine And Related Other (See Comments)    Listed on MAR     Social History:   Social History   Socioeconomic History   Marital status: Widowed    Spouse name: Not on file   Number of children: Not on file   Years of education: Not on file   Highest education level: Not on file  Occupational History   Not on file  Tobacco Use   Smoking status: Former    Packs/day: 1.00    Years: 25.00    Pack years: 25.00    Types: Cigarettes    Quit date: 10/01/1963    Years since quitting: 57.2    Passive exposure: Never   Smokeless tobacco: Never  Vaping Use   Vaping Use: Never used  Substance and Sexual Activity   Alcohol use: Never   Drug use: Never   Sexual activity: Not on file  Other Topics Concern   Not on file  Social History Narrative   Not on file   Social Determinants of Health   Financial Resource Strain: Not on file  Food Insecurity: Not on file  Transportation Needs: Not on file  Physical Activity: Not on file  Stress: Not on file  Social Connections: Not on file  Intimate Partner Violence: Not on file    Family History:    Family History  Problem Relation Age of Onset   Hypertension Other      ROS:  Please see the history of present illness.   All other ROS reviewed and negative.     Physical Exam/Data:   Vitals:   12/08/20 0937 12/08/20 0938 12/08/20 1029 12/08/20 1222  BP:   (!) 97/57 (!) 99/57  Pulse:  (!) 113 (!) 113 (!) 126  Resp:    18  Temp:    97.6 F (36.4 C)  TempSrc:    Oral  SpO2: 93%  93% 94%  Weight:      Height:        Intake/Output Summary (Last 24 hours) at 12/08/2020 1445 Last data filed at 12/08/2020 1200 Gross per 24 hour  Intake 613 ml  Output 1600 ml  Net -987 ml   Last 3 Weights 12/08/2020 12/07/2020 12/05/2020  Weight (lbs) 126 lb 12.2 oz 127 lb 1.6 oz 130 lb  Weight (kg) 57.5 kg 57.652 kg 58.968 kg     Body mass index is 23.19 kg/m.  General:  Well nourished, well developed, in no acute distress HEENT: normal Neck: no JVD Vascular: No carotid bruits; Distal pulses 2+  bilaterally Cardiac:  regular, tach, no m/r/g no jvd Lungs:  clear to auscultation bilaterally, no wheezing, rhonchi or rales  Abd: soft, nontender, no hepatomegaly  Ext: no edema Musculoskeletal:  No deformities, BUE and BLE strength normal and equal Skin: warm and dry  Neuro:  CNs 2-12 intact, no focal abnormalities noted Psych:  Normal affect     Laboratory Data:  High Sensitivity Troponin:   Recent Labs  Lab 12/03/20 2128 12/04/20  0154 12/04/20 0415  TROPONINIHS 96* 109* 116*     Chemistry Recent Labs  Lab 12/03/20 2128 12/04/20 0420 12/05/20 0335 12/06/20 0355 12/07/20 0341 12/08/20 0354  NA  --  138   < > 138 136 135  K  --  4.2   < > 3.3* 3.7 4.4  CL  --  98   < > 97* 94* 90*  CO2  --  33*   < > 35* 31 37*  GLUCOSE  --  83   < > 105* 99 143*  BUN  --  18   < > 17 18 24*  CREATININE  --  0.86   < > 0.64 0.60 0.67  CALCIUM  --  8.6*   < > 8.2* 8.1* 8.7*  MG 1.8 1.8  --   --   --   --   GFRNONAA  --  >60   < > >60 >60 >60  ANIONGAP  --  7   < > 6 11 8    < > = values in this interval not displayed.    Recent Labs  Lab 12/06/20 0355 12/07/20 0341 12/08/20 0354  PROT 5.3* 5.1* 5.6*  ALBUMIN 2.5* 2.4* 2.4*  AST 14* 18 28  ALT 10 12 17   ALKPHOS 77 76 80  BILITOT 0.5 0.4 0.6   Lipids No results for input(s): CHOL, TRIG, HDL, LABVLDL, LDLCALC, CHOLHDL in the last 168 hours.  Hematology Recent Labs  Lab 12/06/20 0355 12/07/20 0341 12/08/20 0354  WBC 12.7* 13.1* 15.3*  RBC 3.67* 3.72* 4.06  HGB 10.6* 10.8* 11.6*  HCT 32.2* 33.0* 36.4  MCV 87.7 88.7 89.7  MCH 28.9 29.0 28.6  MCHC 32.9 32.7 31.9  RDW 14.0 13.9 14.0  PLT 327 310 341   Thyroid  Recent Labs  Lab 12/04/20 0420  TSH 2.915    BNP Recent Labs  Lab 12/03/20 1055  BNP 1,334.5*    DDimer  Recent Labs  Lab 12/04/20 0420  DDIMER 2.88*     Radiology/Studies:  DG Chest 1 View  Result Date: 12/05/2020 CLINICAL DATA:  Post thoracentesis. EXAM: CHEST  1 VIEW COMPARISON:  Chest  x-ray 12/03/2020. FINDINGS: There is a small left pleural effusion which is decreased from the prior examination. There are findings suspicious for tiny left apical pneumothorax. There is no mediastinal shift. Small right pleural effusion is unchanged from the prior examination. The cardiomediastinal silhouette is stable. Surgical changes overlie the right hilar region. There are atherosclerotic calcifications of the aorta. Osseous structures are within normal limits. IMPRESSION: 1. Tiny left apical pneumothorax. 2. Small left pleural effusion has decreased. 3. Small right pleural effusion is unchanged. 4.  Aortic Atherosclerosis (ICD10-I70.0). Electronically Signed   By: Ronney Asters M.D.   On: 12/05/2020 15:48   ECHOCARDIOGRAM COMPLETE  Result Date: 12/08/2020    ECHOCARDIOGRAM REPORT   Patient Name:   Samantha Fitzgerald Date of Exam: 12/08/2020 Medical Rec #:  [id number]              Height:       62.0 in Accession #:    [id number]             Weight:       126.8 lb Date of Birth:  February 20, 1925               BSA:          1.575 m Patient Age:    33 years  BP:           97/57 mmHg Patient Gender: F                      HR:           120 bpm. Exam Location:  Inpatient Procedure: 2D Echo, Color Doppler and Cardiac Doppler Indications:    V76.16 Acute diastolic (congestive) heart failure  History:        Patient has prior history of Echocardiogram examinations, most                 recent 09/14/2020. CHF; COPD. Recent COVID Infection.  Sonographer:    Raquel Sarna Senior RDCS Referring Phys: [id number] Patrecia Pour  Sonographer Comments: Technically difficult due to COPD IMPRESSIONS  1. ? patient in rapid flutter during exam . Left ventricular ejection fraction, by estimation, is 60 to 65%. The left ventricle has normal function. The left ventricle has no regional wall motion abnormalities. Left ventricular diastolic parameters are indeterminate.  2. Right ventricular systolic function is normal. The right  ventricular size is normal.  3. The mitral valve is normal in structure. No evidence of mitral valve regurgitation. No evidence of mitral stenosis.  4. The aortic valve is tricuspid. Aortic valve regurgitation is not visualized. Mild to moderate aortic valve sclerosis/calcification is present, without any evidence of aortic stenosis.  5. The inferior vena cava is normal in size with greater than 50% respiratory variability, suggesting right atrial pressure of 3 mmHg. FINDINGS  Left Ventricle: ? patient in rapid flutter during exam. Left ventricular ejection fraction, by estimation, is 60 to 65%. The left ventricle has normal function. The left ventricle has no regional wall motion abnormalities. The left ventricular internal cavity size was normal in size. There is no left ventricular hypertrophy. Left ventricular diastolic parameters are indeterminate. Right Ventricle: The right ventricular size is normal. No increase in right ventricular wall thickness. Right ventricular systolic function is normal. Left Atrium: Left atrial size was normal in size. Right Atrium: Right atrial size was normal in size. Pericardium: There is no evidence of pericardial effusion. Mitral Valve: The mitral valve is normal in structure. No evidence of mitral valve regurgitation. No evidence of mitral valve stenosis. Tricuspid Valve: The tricuspid valve is normal in structure. Tricuspid valve regurgitation is trivial. No evidence of tricuspid stenosis. Aortic Valve: The aortic valve is tricuspid. Aortic valve regurgitation is not visualized. Mild to moderate aortic valve sclerosis/calcification is present, without any evidence of aortic stenosis. Pulmonic Valve: The pulmonic valve was normal in structure. Pulmonic valve regurgitation is not visualized. No evidence of pulmonic stenosis. Aorta: The aortic root is normal in size and structure. Venous: The inferior vena cava is normal in size with greater than 50% respiratory variability,  suggesting right atrial pressure of 3 mmHg. IAS/Shunts: The interatrial septum was not well visualized.  LEFT VENTRICLE PLAX 2D LVIDd:         2.90 cm LVIDs:         1.90 cm LV PW:         0.90 cm LV IVS:        1.00 cm LVOT diam:     1.80 cm LV SV:         34 LV SV Index:   22 LVOT Area:     2.54 cm  RIGHT VENTRICLE RV S prime:     7.83 cm/s TAPSE (M-mode): 1.0 cm LEFT ATRIUM  Index       RIGHT ATRIUM           Index LA diam:      3.20 cm 2.03 cm/m  RA Area:     10.90 cm LA Vol (A2C): 17.8 ml 11.30 ml/m RA Volume:   21.00 ml  13.33 ml/m LA Vol (A4C): 44.3 ml 28.13 ml/m  AORTIC VALVE LVOT Vmax:   69.80 cm/s LVOT Vmean:  56.700 cm/s LVOT VTI:    0.135 m  AORTA Ao Root diam: 3.20 cm  SHUNTS Systemic VTI:  0.14 m Systemic Diam: 1.80 cm Jenkins Rouge MD Electronically signed by Jenkins Rouge MD Signature Date/Time: 12/08/2020/1:13:34 PM    Final    US THORACENTESIS ASP PLEURAL SPACE W/IMG GUIDE  Result Date: 12/05/2020 INDICATION: Pleural effusion. EXAM: ULTRASOUND GUIDED DIAGNOSITIC AND THERAPEUTIC THORACENTESIS MEDICATIONS: None. COMPLICATIONS: None immediate. PROCEDURE: An ultrasound guided thoracentesis was thoroughly discussed with the patient and questions answered. The benefits, risks, alternatives and complications were also discussed. The patient understands and wishes to proceed with the procedure. Written consent was obtained. Ultrasound was performed to localize and mark an adequate pocket of fluid in the left chest. The area was then prepped and draped in the normal sterile fashion. 1% Lidocaine was used for local anesthesia. Under ultrasound guidance a 6 Fr Safe-T-Centesis catheter was introduced. Thoracentesis was performed. The catheter was removed and a dressing applied. FINDINGS: A total of approximately 800 of red-tinged fluid was removed. Samples were sent to the laboratory as requested by the clinical team. IMPRESSION: Successful ultrasound guided diagnostic and therapeutic  thoracentesis yielding 800cc of pleural fluid. Electronically Signed   By: Ruthann Cancer M.D.   On: 12/05/2020 15:32     Assessment and Plan:   1.PSVT - history of PSVT followed by [facility] cardiology. She has been on metoprolol, digoxin, flecanide as an outpatient. Discussions about possible EP study and ablation but mutual decision made not pursue by patient and provider, partly due to advanced age - in setting of severe systemic illness with multiple medical issues including severe sepsis patient has had issues with her PSVT. Her lopressor was held due to low bp's just restarted  - currently on flecanide 75mg  bid, digoxin 0.125mg  daily, lopressor 12.5mg  bid (just restarted today) - prior dig level this admit 1.4. Check flecanide level  - issue is not overall failure of her prior regimen but the severe systemic stress this admission - soft bp's limited lopressor dosing - will give IV digoxin 0.125mg  IV x 1. If ongoing tachcardia could repeat another 0.125mg  IV x 1 6 hours after prior dose.     2.Acute on chronic diastolic HF - initially diuresed this admission, now off diuretics       For questions or updates, please contact [place] Please consult [email or web address] for contact info under    Signed, Carlyle Dolly, MD  12/08/2020 2:45 PM

## 2020-12-09 DIAGNOSIS — I471 Supraventricular tachycardia: Secondary | ICD-10-CM | POA: Diagnosis not present

## 2020-12-09 LAB — BODY FLUID CULTURE W GRAM STAIN: Culture: NO GROWTH

## 2020-12-09 LAB — CBC WITH DIFFERENTIAL/PLATELET
Abs Immature Granulocytes: 0.22 10*3/uL — ABNORMAL HIGH (ref 0.00–0.07)
Basophils Absolute: 0.1 10*3/uL (ref 0.0–0.1)
Basophils Relative: 1 %
Eosinophils Absolute: 0.4 10*3/uL (ref 0.0–0.5)
Eosinophils Relative: 3 %
HCT: 35.8 % — ABNORMAL LOW (ref 36.0–46.0)
Hemoglobin: 11.2 g/dL — ABNORMAL LOW (ref 12.0–15.0)
Immature Granulocytes: 2 %
Lymphocytes Relative: 11 %
Lymphs Abs: 1.4 10*3/uL (ref 0.7–4.0)
MCH: 28.4 pg (ref 26.0–34.0)
MCHC: 31.3 g/dL (ref 30.0–36.0)
MCV: 90.6 fL (ref 80.0–100.0)
Monocytes Absolute: 0.9 10*3/uL (ref 0.1–1.0)
Monocytes Relative: 7 %
Neutro Abs: 10.1 10*3/uL — ABNORMAL HIGH (ref 1.7–7.7)
Neutrophils Relative %: 76 %
Platelets: 310 10*3/uL (ref 150–400)
RBC: 3.95 MIL/uL (ref 3.87–5.11)
RDW: 14 % (ref 11.5–15.5)
WBC: 13.1 10*3/uL — ABNORMAL HIGH (ref 4.0–10.5)
nRBC: 0 % (ref 0.0–0.2)

## 2020-12-09 LAB — BASIC METABOLIC PANEL
Anion gap: 9 (ref 5–15)
BUN: 29 mg/dL — ABNORMAL HIGH (ref 8–23)
CO2: 39 mmol/L — ABNORMAL HIGH (ref 22–32)
Calcium: 8.9 mg/dL (ref 8.9–10.3)
Chloride: 90 mmol/L — ABNORMAL LOW (ref 98–111)
Creatinine, Ser: 0.69 mg/dL (ref 0.44–1.00)
GFR, Estimated: >60 mL/min (ref >60)
Glucose, Bld: 99 mg/dL (ref 70–99)
Potassium: 4.6 mmol/L (ref 3.5–5.1)
Sodium: 138 mmol/L (ref 135–145)

## 2020-12-09 LAB — MAGNESIUM: Magnesium: 2.2 mg/dL (ref 1.7–2.4)

## 2020-12-09 NOTE — Progress Notes (Signed)
Progress Note  Patient Name: Samantha Fitzgerald Date of Encounter: 12/09/2020  [facility] Cardiologist: [facility]  Subjective   No complaints  Inpatient Medications    Scheduled Meds:  albuterol  2 puff Inhalation BID   Chlorhexidine Gluconate Cloth  6 each Topical Q0600   digoxin  0.125 mg Oral Daily   enoxaparin (LOVENOX) injection  1 mg/kg Subcutaneous Q12H   feeding supplement  237 mL Oral BID BM   flecainide  75 mg Oral BID   fluticasone furoate-vilanterol  1 puff Inhalation Daily   And   umeclidinium bromide  1 puff Inhalation Daily   metoprolol tartrate  12.5 mg Oral BID   mirtazapine  7.5 mg Oral QHS   multivitamin with minerals  1 tablet Oral Daily   sodium chloride flush  3 mL Intravenous Q12H   Continuous Infusions:  sodium chloride     ceFEPime (MAXIPIME) IV 2 g (12/08/20 2115)   lactated ringers 75 mL/hr at 12/09/20 0600   vancomycin 500 mg (12/08/20 1239)   PRN Meds: sodium chloride, acetaminophen **OR** acetaminophen, albuterol, sodium chloride flush   Vital Signs    Vitals:   12/08/20 2045 12/08/20 2300 12/09/20 0100 12/09/20 0425  BP: (!) 116/56   (!) 143/53  Pulse: (!) 118 76  80  Resp: 16  (!) 23 18  Temp: 98.1 F (36.7 C)   98.1 F (36.7 C)  TempSrc: Oral   Axillary  SpO2: 93%   (!) 87%  Weight:    60.3 kg  Height:        Intake/Output Summary (Last 24 hours) at 12/09/2020 0744 Last data filed at 12/09/2020 0400 Gross per 24 hour  Intake 1537.2 ml  Output 450 ml  Net 1087.2 ml   Last 3 Weights 12/09/2020 12/08/2020 12/07/2020  Weight (lbs) 132 lb 15 oz 126 lb 12.2 oz 127 lb 1.6 oz  Weight (kg) 60.3 kg 57.5 kg 57.652 kg      Telemetry    SR - Personally Reviewed  ECG    N/a - Personally Reviewed  Physical Exam   GEN: No acute distress.   Neck: No JVD Cardiac: RRR, no murmurs, rubs, or gallops.  Respiratory: Clear to auscultation bilaterally. GI: Soft, nontender, non-distended  MS: No edema; No  deformity. Neuro:  Nonfocal  Psych: Normal affect   Labs    High Sensitivity Troponin:   Recent Labs  Lab 12/03/20 2128 12/04/20 0154 12/04/20 0415  TROPONINIHS 96* 109* 116*     Chemistry Recent Labs  Lab 12/03/20 2128 12/04/20 0420 12/05/20 0335 12/06/20 0355 12/07/20 0341 12/08/20 0354 12/09/20 0318  NA  --  138   < > 138 136 135 138  K  --  4.2   < > 3.3* 3.7 4.4 4.6  CL  --  98   < > 97* 94* 90* 90*  CO2  --  33*   < > 35* 31 37* 39*  GLUCOSE  --  83   < > 105* 99 143* 99  BUN  --  18   < > 17 18 24* 29*  CREATININE  --  0.86   < > 0.64 0.60 0.67 0.69  CALCIUM  --  8.6*   < > 8.2* 8.1* 8.7* 8.9  MG 1.8 1.8  --   --   --   --  2.2  PROT  --  5.7*   < > 5.3* 5.1* 5.6*  --   ALBUMIN  --  2.7*   < > 2.5* 2.4* 2.4*  --   AST  --  20   < > 14* 18 28  --   ALT  --  12   < > 10 12 17   --   ALKPHOS  --  78   < > 77 76 80  --   BILITOT  --  0.6   < > 0.5 0.4 0.6  --   GFRNONAA  --  >60   < > >60 >60 >60 >60  ANIONGAP  --  7   < > 6 11 8 9    < > = values in this interval not displayed.    Lipids No results for input(s): CHOL, TRIG, HDL, LABVLDL, LDLCALC, CHOLHDL in the last 168 hours.  Hematology Recent Labs  Lab 12/07/20 0341 12/08/20 0354 12/09/20 0318  WBC 13.1* 15.3* 13.1*  RBC 3.72* 4.06 3.95  HGB 10.8* 11.6* 11.2*  HCT 33.0* 36.4 35.8*  MCV 88.7 89.7 90.6  MCH 29.0 28.6 28.4  MCHC 32.7 31.9 31.3  RDW 13.9 14.0 14.0  PLT 310 341 310   Thyroid  Recent Labs  Lab 12/04/20 0420  TSH 2.915    BNP Recent Labs  Lab 12/03/20 1055  BNP 1,334.5*    DDimer  Recent Labs  Lab 12/04/20 0420  DDIMER 2.88*     Radiology    ECHOCARDIOGRAM COMPLETE  Result Date: 12/08/2020    ECHOCARDIOGRAM REPORT   Patient Name:   Samantha Fitzgerald Date of Exam: 12/08/2020 Medical Rec #:  [id number]              Height:       62.0 in Accession #:    [id number]             Weight:       126.8 lb Date of Birth:  06-02-1924               BSA:          1.575 m Patient  Age:    87 years               BP:           97/57 mmHg Patient Gender: F                      HR:           120 bpm. Exam Location:  Inpatient Procedure: 2D Echo, Color Doppler and Cardiac Doppler Indications:    W23.76 Acute diastolic (congestive) heart failure  History:        Patient has prior history of Echocardiogram examinations, most                 recent 09/14/2020. CHF; COPD. Recent COVID Infection.  Sonographer:    Raquel Sarna Senior RDCS Referring Phys: [id number] Patrecia Pour  Sonographer Comments: Technically difficult due to COPD IMPRESSIONS  1. ? patient in rapid flutter during exam . Left ventricular ejection fraction, by estimation, is 60 to 65%. The left ventricle has normal function. The left ventricle has no regional wall motion abnormalities. Left ventricular diastolic parameters are indeterminate.  2. Right ventricular systolic function is normal. The right ventricular size is normal.  3. The mitral valve is normal in structure. No evidence of mitral valve regurgitation. No evidence of mitral stenosis.  4. The aortic valve is tricuspid. Aortic valve regurgitation is not visualized. Mild to moderate aortic valve sclerosis/calcification is present,  without any evidence of aortic stenosis.  5. The inferior vena cava is normal in size with greater than 50% respiratory variability, suggesting right atrial pressure of 3 mmHg. FINDINGS  Left Ventricle: ? patient in rapid flutter during exam. Left ventricular ejection fraction, by estimation, is 60 to 65%. The left ventricle has normal function. The left ventricle has no regional wall motion abnormalities. The left ventricular internal cavity size was normal in size. There is no left ventricular hypertrophy. Left ventricular diastolic parameters are indeterminate. Right Ventricle: The right ventricular size is normal. No increase in right ventricular wall thickness. Right ventricular systolic function is normal. Left Atrium: Left atrial size was normal in size.  Right Atrium: Right atrial size was normal in size. Pericardium: There is no evidence of pericardial effusion. Mitral Valve: The mitral valve is normal in structure. No evidence of mitral valve regurgitation. No evidence of mitral valve stenosis. Tricuspid Valve: The tricuspid valve is normal in structure. Tricuspid valve regurgitation is trivial. No evidence of tricuspid stenosis. Aortic Valve: The aortic valve is tricuspid. Aortic valve regurgitation is not visualized. Mild to moderate aortic valve sclerosis/calcification is present, without any evidence of aortic stenosis. Pulmonic Valve: The pulmonic valve was normal in structure. Pulmonic valve regurgitation is not visualized. No evidence of pulmonic stenosis. Aorta: The aortic root is normal in size and structure. Venous: The inferior vena cava is normal in size with greater than 50% respiratory variability, suggesting right atrial pressure of 3 mmHg. IAS/Shunts: The interatrial septum was not well visualized.  LEFT VENTRICLE PLAX 2D LVIDd:         2.90 cm LVIDs:         1.90 cm LV PW:         0.90 cm LV IVS:        1.00 cm LVOT diam:     1.80 cm LV SV:         34 LV SV Index:   22 LVOT Area:     2.54 cm  RIGHT VENTRICLE RV S prime:     7.83 cm/s TAPSE (M-mode): 1.0 cm LEFT ATRIUM           Index       RIGHT ATRIUM           Index LA diam:      3.20 cm 2.03 cm/m  RA Area:     10.90 cm LA Vol (A2C): 17.8 ml 11.30 ml/m RA Volume:   21.00 ml  13.33 ml/m LA Vol (A4C): 44.3 ml 28.13 ml/m  AORTIC VALVE LVOT Vmax:   69.80 cm/s LVOT Vmean:  56.700 cm/s LVOT VTI:    0.135 m  AORTA Ao Root diam: 3.20 cm  SHUNTS Systemic VTI:  0.14 m Systemic Diam: 1.80 cm Jenkins Rouge MD Electronically signed by Jenkins Rouge MD Signature Date/Time: 12/08/2020/1:13:34 PM    Final     Cardiac Studies   11/2020 echo 1. ? patient in rapid flutter during exam . Left ventricular ejection  fraction, by estimation, is 60 to 65%. The left ventricle has normal  function. The left  ventricle has no regional wall motion abnormalities.  Left ventricular diastolic parameters are  indeterminate.   2. Right ventricular systolic function is normal. The right ventricular  size is normal.   3. The mitral valve is normal in structure. No evidence of mitral valve  regurgitation. No evidence of mitral stenosis.   4. The aortic valve is tricuspid. Aortic valve regurgitation is not  visualized.  Mild to moderate aortic valve sclerosis/calcification is  present, without any evidence of aortic stenosis.   5. The inferior vena cava is normal in size with greater than 50%  respiratory variability, suggesting right atrial pressure of 3 mmHg.   Patient Profile     Samantha Fitzgerald is a 87 y.o. female with a hx of PSVT who is being seen 12/08/2020 for the evaluation of PSVT at the request of Dr Vilma Meckel.  Assessment & Plan    1.PSVT - history of PSVT followed by [facility] cardiology. She has been on metoprolol, digoxin, flecanide as an outpatient. Discussions about possible EP study and ablation but mutual decision made not to pursue by patient and provider, partly due to advanced age - in setting of severe systemic illness with multiple medical issues including severe sepsis patient has had issues with her PSVT. Her lopressor was held due to low bp's just restarted   - currently on flecanide 75mg  bid, digoxin 0.125mg  daily, lopressor 12.5mg  bid (just restarted yesterday) - prior dig level this admit 1.4. Flecanide level pending   - issue is not overall failure of her prior regimen but the severe systemic stress this admission driving her PSVT - soft bp's limit lopressor dosing though bps significant improved this AM  -yesterday gave additional IV digoxin 0.125.  - she has converted back to NSR - continue to follow tele. If recurrence and bp's remain stable could increase lopressor further. If soft bp's and persistent SVT could redose IV digoxin 0.125mg  x 1.       2.Acute on  chronic diastolic HF - initially diuresed this admission, now off diuretics   3.Sepsis/UTI/MRSE bacteremia - per primary team  4. DVT - on lovenox per primary team  We will follow up telemetry tomorrow     For questions or updates, please contact [place] Please consult [email or web address] for contact info under        Signed, Carlyle Dolly, MD  12/09/2020, 7:44 AM

## 2020-12-09 NOTE — Progress Notes (Signed)
PROGRESS NOTE  Samantha Fitzgerald  QQV:[id number] DOB: [birth year] DOA: 12/03/2020 PCP: System, Provider Not In   Brief Narrative: Samantha Fitzgerald is a 87 y.o. female with medical history significant of COPD 2 L nasal cannula, sick sinus syndrome and SVT, Grade 1 diastolic HF, with recurrent large left-sided pleural effusion, recently diagnosed with covid June/July 2022.  Patient presents to the ED from [facility] facility with hypotension general weakness with reportedly acute onset blood pressure noted to be as low as 80/60 with bilateral lower extremity edema and worsening hypoxic respiratory failure. She was found to have severe sepsis with Proteus UTI, LLE cellulitis, and volume overload with pleural effusion. Thoracentesis was performed and diuresis begun with improvement in respiratory status. On 9/24, the patient developed SVT terminated with metoprolol IV.  Assessment & Plan:  Severe sepsis secondary to polymicrobial UTI, POA:  - Leukocytosis, tachycardia with lactic acidosis = severe sepsis  - Proteus with resistance noted on cultures, transition to cefepime.  - Resistant Enterococcus covered by vancomycin as below - Leukocytosis downtrending appropriately   Cellulitis of left lower extremity:  - Nonpurulent - Cefepime and vancomycin being given. Vanc added for GPC's discussed below.   MRSE in blood cultures:  - 2 aerobic bottles out of 4 total bottles collected on 9/19 from right AC, left forearm. - Continue vancomycin. With ongoing leukocytosis, no vegetations on echo. - Repeat blood cultures 9/21 remain negative  Acute on chronic hypoxic respiratory failure:  - Likely related to symptomatic pleural effusions (R > L) - s/p 800cc red-tinged w/protein <3, cyto pending, with negative gram stain on L thoracentesis 9/21. Thora last done 6/30 with 850cc removed. -Continue to titrate oxygen to baseline 2 L nasal cannula    Acute on chronic diastolic CHF: - IVF  discontinued, will hold diuretics for additional 24 hours, likely resume pending resolution of tachycardia and SVT.     Acute DVT of the right common femoral vein and SF junction:  - Continue lovenox 1mg /kg q12h.    SVT:  - Continue digoxin (level 1.4) and flecainide.  - Metoprolol was held due to hypotension initially, continue low dose - Cardiology following - appreciate insight/recs   COPD: Not in exacerbation.  - Continue inhaled therapies.    History of covid-19 infection: Treated with remdesivir and steroids. No pulmonary symptoms, infiltrate on CXR. CT is 42 not consistent with active infection.  - No isolation/Tx required.    Hypokalemia/Hypomagnesemia:  - Continue monitoring.   DVT prophylaxis: Lovenox treatment dose - 1mg /kg q12h   Code Status: DNR Family Communication: None present Disposition Plan:  Status is: Inpatient  Remains inpatient appropriate because:Ongoing diagnostic testing needed not appropriate for outpatient work up and Inpatient level of care appropriate due to severity of illness  Dispo: The patient is from: ALF              Anticipated d/c is to: ALF vs. SNF depending on mobility improvements during admission.              Patient currently is not medically stable to d/c.   Consultants:  Cardiology, IR  Procedures:  U/S-guided thoracentesis on left 12/05/2020 by Dr. Serafina Royals.  Antimicrobials: Vancomycin 9/19, then 9/21 >> Ceftriaxone 9/19 - 9/20 Cefepime 9/19, then 9/21 >>  Subjective: No acute issues or events overnight pleasantly demented, review of systems somewhat limited but patient states she feels "well" and is "working on breakfast"  Objective: Vitals:   12/08/20 2045 12/08/20 2300 12/09/20 0100 12/09/20 0425  BP: (!) 116/56   (!) 143/53  Pulse: (!) 118 76  80  Resp: 16  (!) 23 18  Temp: 98.1 F (36.7 C)   98.1 F (36.7 C)  TempSrc: Oral   Axillary  SpO2: 93%   (!) 87%  Weight:    60.3 kg  Height:        Intake/Output  Summary (Last 24 hours) at 12/09/2020 0751 Last data filed at 12/09/2020 0400 Gross per 24 hour  Intake 1537.2 ml  Output 450 ml  Net 1087.2 ml    Filed Weights   12/07/20 1219 12/08/20 0500 12/09/20 0425  Weight: 57.7 kg 57.5 kg 60.3 kg   General: Pleasantly resting in bed, No acute distress.  Alert to person only HEENT:  Normocephalic atraumatic.  Sclerae nonicteric, noninjected.  Extraocular movements intact bilaterally. Neck:  Without mass or deformity.  Trachea is midline. Lungs:  Clear to auscultate bilaterally without rhonchi, wheeze, or rales. Heart:  Regular rate and rhythm.  Without murmurs, rubs, or gallops. Abdomen:  Soft, nontender, nondistended.  Without guarding or rebound. Extremities: Without cyanosis, clubbing, edema Vascular:  Dorsalis pedis and posterior tibial pulses palpable bilaterally. Skin: Bandages clean dry intact, otherwise as below:      Data Reviewed: I have personally reviewed following labs and imaging studies  CBC: Recent Labs  Lab 12/05/20 0335 12/06/20 0355 12/07/20 0341 12/08/20 0354 12/09/20 0318  WBC 15.8* 12.7* 13.1* 15.3* 13.1*  NEUTROABS 13.3* 10.7* 10.3* 12.3* 10.1*  HGB 11.2* 10.6* 10.8* 11.6* 11.2*  HCT 34.4* 32.2* 33.0* 36.4 35.8*  MCV 88.2 87.7 88.7 89.7 90.6  PLT 342 327 310 341 371    Basic Metabolic Panel: Recent Labs  Lab 12/03/20 2128 12/04/20 0420 12/05/20 0335 12/06/20 0355 12/07/20 0341 12/08/20 0354 12/09/20 0318  NA  --  138 135 138 136 135 138  K  --  4.2 4.0 3.3* 3.7 4.4 4.6  CL  --  98 95* 97* 94* 90* 90*  CO2  --  33* 27 35* 31 37* 39*  GLUCOSE  --  83 63* 105* 99 143* 99  BUN  --  18 17 17 18  24* 29*  CREATININE  --  0.86 0.77 0.64 0.60 0.67 0.69  CALCIUM  --  8.6* 7.9* 8.2* 8.1* 8.7* 8.9  MG 1.8 1.8  --   --   --   --  2.2  PHOS 3.8 3.8  --   --   --   --   --     GFR: Estimated Creatinine Clearance: 36.8 mL/min (by C-G formula based on SCr of 0.69 mg/dL). Liver Function Tests: Recent Labs   Lab 12/04/20 0420 12/05/20 0335 12/06/20 0355 12/07/20 0341 12/08/20 0354  AST 20 15 14* 18 28  ALT 12 11 10 12 17   ALKPHOS 78 83 77 76 80  BILITOT 0.6 0.9 0.5 0.4 0.6  PROT 5.7* 5.3* 5.3* 5.1* 5.6*  ALBUMIN 2.7* 2.6* 2.5* 2.4* 2.4*    No results for input(s): LIPASE, AMYLASE in the last 168 hours. No results for input(s): AMMONIA in the last 168 hours. Coagulation Profile: Recent Labs  Lab 12/03/20 1058 12/04/20 0420  INR 1.0 1.1    Cardiac Enzymes: Recent Labs  Lab 12/03/20 2128  CKTOTAL 31*    BNP (last 3 results) No results for input(s): PROBNP in the last 8760 hours. HbA1C: No results for input(s): HGBA1C in the last 72 hours. CBG: No results for input(s): GLUCAP in the last 168 hours. Lipid  Profile: No results for input(s): CHOL, HDL, LDLCALC, TRIG, CHOLHDL, LDLDIRECT in the last 72 hours. Thyroid Function Tests: No results for input(s): TSH, T4TOTAL, FREET4, T3FREE, THYROIDAB in the last 72 hours.  Anemia Panel: No results for input(s): VITAMINB12, FOLATE, FERRITIN, TIBC, IRON, RETICCTPCT in the last 72 hours.  Urine analysis:    Component Value Date/Time   COLORURINE STRAW (A) 12/03/2020 1458   APPEARANCEUR TURBID (A) 12/03/2020 1458   LABSPEC  12/03/2020 1458    TEST NOT REPORTED DUE TO COLOR INTERFERENCE OF URINE PIGMENT   PHURINE  12/03/2020 1458    TEST NOT REPORTED DUE TO COLOR INTERFERENCE OF URINE PIGMENT   GLUCOSEU (A) 12/03/2020 1458    TEST NOT REPORTED DUE TO COLOR INTERFERENCE OF URINE PIGMENT   HGBUR (A) 12/03/2020 1458    TEST NOT REPORTED DUE TO COLOR INTERFERENCE OF URINE PIGMENT   BILIRUBINUR (A) 12/03/2020 1458    TEST NOT REPORTED DUE TO COLOR INTERFERENCE OF URINE PIGMENT   KETONESUR (A) 12/03/2020 1458    TEST NOT REPORTED DUE TO COLOR INTERFERENCE OF URINE PIGMENT   PROTEINUR (A) 12/03/2020 1458    TEST NOT REPORTED DUE TO COLOR INTERFERENCE OF URINE PIGMENT   NITRITE (A) 12/03/2020 1458    TEST NOT REPORTED DUE TO COLOR  INTERFERENCE OF URINE PIGMENT   LEUKOCYTESUR (A) 12/03/2020 1458    TEST NOT REPORTED DUE TO COLOR INTERFERENCE OF URINE PIGMENT   Recent Results (from the past 240 hour(s))  Culture, blood (Routine x 2)     Status: Abnormal   Collection Time: 12/03/20 10:54 AM   Specimen: BLOOD LEFT FOREARM  Result Value Ref Range Status   Specimen Description   Final    BLOOD LEFT FOREARM Performed at [facility], [place] [address] [place], [place] [address]    Special Requests   Final    BOTTLES DRAWN AEROBIC AND ANAEROBIC Blood Culture adequate volume Performed at [place] [address] [place], [place] [address]    Culture  Setup Time   Final    GRAM POSITIVE COCCI IN CLUSTERS AEROBIC BOTTLE ONLY Organism ID to follow CRITICAL RESULT CALLED TO, READ BACK BY AND VERIFIED WITH: A TUCKER PHARMD 1652 12/04/20 A BROWNING    Culture (A)  Final    STAPHYLOCOCCUS EPIDERMIDIS SUSCEPTIBILITIES PERFORMED ON PREVIOUS CULTURE WITHIN THE LAST 5 DAYS. Performed at [facility], [place] [address], [place], [place] [address]    Report Status 12/07/2020 FINAL  Final  Blood Culture ID Panel (Reflexed)     Status: Abnormal   Collection Time: 12/03/20 10:54 AM  Result Value Ref Range Status   Enterococcus faecalis NOT DETECTED NOT DETECTED Final   Enterococcus Faecium NOT DETECTED NOT DETECTED Final   Listeria monocytogenes NOT DETECTED NOT DETECTED Final   Staphylococcus species DETECTED (A) NOT DETECTED Final    Comment: CRITICAL RESULT CALLED TO, READ BACK BY AND VERIFIED WITH: A TUCKER PHARMD 1652 12/04/20 A BROWNING    Staphylococcus aureus (BCID) NOT DETECTED NOT DETECTED Final   Staphylococcus epidermidis DETECTED (A) NOT DETECTED Final    Comment: Methicillin (oxacillin) resistant coagulase negative staphylococcus. Possible blood culture contaminant (unless isolated from more than one blood culture draw or clinical case suggests pathogenicity). No  antibiotic treatment is indicated for blood  culture contaminants. CRITICAL RESULT CALLED TO, READ BACK BY AND VERIFIED WITH: A TUCKER PHARMD 1652 12/04/20 A BROWNING    Staphylococcus lugdunensis NOT DETECTED NOT DETECTED Final  Streptococcus species NOT DETECTED NOT DETECTED Final   Streptococcus agalactiae NOT DETECTED NOT DETECTED Final   Streptococcus pneumoniae NOT DETECTED NOT DETECTED Final   Streptococcus pyogenes NOT DETECTED NOT DETECTED Final   A.calcoaceticus-baumannii NOT DETECTED NOT DETECTED Final   Bacteroides fragilis NOT DETECTED NOT DETECTED Final   Enterobacterales NOT DETECTED NOT DETECTED Final   Enterobacter cloacae complex NOT DETECTED NOT DETECTED Final   Escherichia coli NOT DETECTED NOT DETECTED Final   Klebsiella aerogenes NOT DETECTED NOT DETECTED Final   Klebsiella oxytoca NOT DETECTED NOT DETECTED Final   Klebsiella pneumoniae NOT DETECTED NOT DETECTED Final   Proteus species NOT DETECTED NOT DETECTED Final   Salmonella species NOT DETECTED NOT DETECTED Final   Serratia marcescens NOT DETECTED NOT DETECTED Final   Haemophilus influenzae NOT DETECTED NOT DETECTED Final   Neisseria meningitidis NOT DETECTED NOT DETECTED Final   Pseudomonas aeruginosa NOT DETECTED NOT DETECTED Final   Stenotrophomonas maltophilia NOT DETECTED NOT DETECTED Final   Candida albicans NOT DETECTED NOT DETECTED Final   Candida auris NOT DETECTED NOT DETECTED Final   Candida glabrata NOT DETECTED NOT DETECTED Final   Candida krusei NOT DETECTED NOT DETECTED Final   Candida parapsilosis NOT DETECTED NOT DETECTED Final   Candida tropicalis NOT DETECTED NOT DETECTED Final   Cryptococcus neoformans/gattii NOT DETECTED NOT DETECTED Final   Methicillin resistance mecA/C DETECTED (A) NOT DETECTED Final    Comment: CRITICAL RESULT CALLED TO, READ BACK BY AND VERIFIED WITHAustin Miles PHARMD 1652 12/04/20 A BROWNING Performed at [facility], [address] [place], [place]  [address]   Culture, blood (Routine x 2)     Status: Abnormal   Collection Time: 12/03/20 10:59 AM   Specimen: BLOOD  Result Value Ref Range Status   Specimen Description   Final    BLOOD RIGHT ANTECUBITAL Performed at [place] [address] [place], [place] [address]    Special Requests   Final    BOTTLES DRAWN AEROBIC AND ANAEROBIC Blood Culture adequate volume Performed at [place] [address] [place], [place] [address]    Culture  Setup Time   Final    GRAM POSITIVE COCCI IN CLUSTERS AEROBIC BOTTLE ONLY CRITICAL VALUE NOTED.  VALUE IS CONSISTENT WITH PREVIOUSLY REPORTED AND CALLED VALUE. Performed at [facility], [place] [address] [place],  [address]    Culture STAPHYLOCOCCUS EPIDERMIDIS (A)  Final   Report Status 12/07/2020 FINAL  Final   Organism ID, Bacteria STAPHYLOCOCCUS EPIDERMIDIS  Final      Susceptibility   Staphylococcus epidermidis - MIC*    CIPROFLOXACIN >=8 RESISTANT Resistant     ERYTHROMYCIN >=8 RESISTANT Resistant     GENTAMICIN >=16 RESISTANT Resistant     OXACILLIN >=4 RESISTANT Resistant     TETRACYCLINE 2 SENSITIVE Sensitive     VANCOMYCIN 4 SENSITIVE Sensitive     TRIMETH/SULFA 80 RESISTANT Resistant     CLINDAMYCIN >=8 RESISTANT Resistant     RIFAMPIN <=0.5 SENSITIVE Sensitive     Inducible Clindamycin NEGATIVE Sensitive     * STAPHYLOCOCCUS EPIDERMIDIS  Resp Panel by RT-PCR (Flu A&B, Covid) Nasopharyngeal Swab     Status: Abnormal   Collection Time: 12/03/20 12:03 PM   Specimen: Nasopharyngeal Swab; Nasopharyngeal(NP) swabs in vial transport medium  Result Value Ref Range Status   SARS Coronavirus 2 by RT PCR POSITIVE (A) NEGATIVE Final    Comment: RESULT CALLED TO, READ BACK BY AND VERIFIED  WITH: JACOBS,D. RN @1310  ON 12/03/2020 BY COHEN,K (NOTE) SARS-CoV-2 target nucleic acids are DETECTED.  The SARS-CoV-2 RNA is generally detectable in upper respiratory specimens during the acute  phase of infection. Positive results are indicative of the presence of the identified virus, but do not rule out bacterial infection or co-infection with other pathogens not detected by the test. Clinical correlation with patient history and other diagnostic information is necessary to determine patient infection status. The expected result is Negative.  Fact Sheet for Patients: EntrepreneurPulse.com.au  Fact Sheet for Healthcare Providers: IncredibleEmployment.be  This test is not yet approved or cleared by the [place] FDA and  has been authorized for detection and/or diagnosis of SARS-CoV-2 by FDA under an Emergency Use Authorization (EUA).  This EUA will remain in effect (meaning this t est can be used) for the duration of  the COVID-19 declaration under Section 564(b)(1) of the Act, 21 U.S.C. section 360bbb-3(b)(1), unless the authorization is terminated or revoked sooner.     Influenza A by PCR NEGATIVE NEGATIVE Final   Influenza B by PCR NEGATIVE NEGATIVE Final    Comment: (NOTE) The Xpert Xpress SARS-CoV-2/FLU/RSV plus assay is intended as an aid in the diagnosis of influenza from Nasopharyngeal swab specimens and should not be used as a sole basis for treatment. Nasal washings and aspirates are unacceptable for Xpert Xpress SARS-CoV-2/FLU/RSV testing.  Fact Sheet for Patients: EntrepreneurPulse.com.au  Fact Sheet for Healthcare Providers: IncredibleEmployment.be  This test is not yet approved or cleared by the [place] FDA and has been authorized for detection and/or diagnosis of SARS-CoV-2 by FDA under an Emergency Use Authorization (EUA). This EUA will remain in effect (meaning this test can be used) for the duration of the COVID-19 declaration under Section 564(b)(1) of the Act, 21 U.S.C. section 360bbb-3(b)(1), unless the authorization is terminated or revoked.  Performed at  [facility], [place] [address], [place], [place] [address]   Urine Culture     Status: Abnormal   Collection Time: 12/03/20  2:59 PM   Specimen: Urine, Clean Catch  Result Value Ref Range Status   Specimen Description   Final    URINE, CLEAN CATCH Performed at [facility], [place] [address], [place], [place] [address]    Special Requests   Final    NONE Performed at [facility], [place] [address], [place], [place] [address]    Culture (A)  Final    >=100,000 COLONIES/mL PROTEUS VULGARIS >=100,000 COLONIES/mL ENTEROCOCCUS FAECALIS    Report Status 12/07/2020 FINAL  Final   Organism ID, Bacteria PROTEUS VULGARIS (A)  Final   Organism ID, Bacteria ENTEROCOCCUS FAECALIS (A)  Final      Susceptibility   Enterococcus faecalis - MIC*    AMPICILLIN <=2 SENSITIVE Sensitive     NITROFURANTOIN <=16 SENSITIVE Sensitive     VANCOMYCIN 1 SENSITIVE Sensitive     * >=100,000 COLONIES/mL ENTEROCOCCUS FAECALIS   Proteus vulgaris - MIC*    AMPICILLIN >=32 RESISTANT Resistant     CEFAZOLIN >=64 RESISTANT Resistant     CEFEPIME <=0.12 SENSITIVE Sensitive     CIPROFLOXACIN <=0.25 SENSITIVE Sensitive     GENTAMICIN <=1 SENSITIVE Sensitive     IMIPENEM 2 SENSITIVE Sensitive     NITROFURANTOIN 128 RESISTANT Resistant     TRIMETH/SULFA <=20 SENSITIVE Sensitive     AMPICILLIN/SULBACTAM 16 INTERMEDIATE Intermediate     PIP/TAZO <=4 SENSITIVE Sensitive     * >=100,000 COLONIES/mL PROTEUS VULGARIS  MRSA Next  Gen by PCR, Nasal     Status: None   Collection Time: 12/04/20  4:06 PM   Specimen: Nasal Mucosa; Nasal Swab  Result Value Ref Range Status   MRSA by PCR Next Gen NOT DETECTED NOT DETECTED Final    Comment: (NOTE) The GeneXpert MRSA Assay (FDA approved for NASAL specimens only), is one component of a comprehensive MRSA colonization surveillance program. It is not intended to diagnose MRSA infection nor to guide or monitor treatment for MRSA  infections. Test performance is not FDA approved in patients less than 5 years old. Performed at [facility], [place] [address] [place], [place] [address]   Culture, blood (routine x 2)     Status: None (Preliminary result)   Collection Time: 12/05/20  7:38 AM   Specimen: BLOOD RIGHT HAND  Result Value Ref Range Status   Specimen Description   Final    BLOOD RIGHT HAND Performed at [place] [address], [place], [place] [address]    Special Requests   Final    BOTTLES DRAWN AEROBIC ONLY Blood Culture adequate volume Performed at [place] [address] [place], [place] [address]    Culture   Final    NO GROWTH 3 DAYS Performed at [facility], [place] [address], [place], [place] [address]    Report Status PENDING  Incomplete  Culture, blood (routine x 2)     Status: None (Preliminary result)   Collection Time: 12/05/20  7:38 AM   Specimen: BLOOD  Result Value Ref Range Status   Specimen Description   Final    BLOOD RIGHT ANTECUBITAL Performed at [place] [address] [place], [place] [address]    Special Requests   Final    BOTTLES DRAWN AEROBIC ONLY Blood Culture adequate volume Performed at [place] [address], [place], [place] [address]    Culture   Final    NO GROWTH 3 DAYS Performed at [facility], [place] [address], [place], [place] [address]    Report Status PENDING  Incomplete  Body fluid culture w Gram Stain     Status: None   Collection Time: 12/05/20  3:27 PM   Specimen: PATH Cytology Pleural fluid  Result Value Ref Range Status   Specimen Description   Final    PLEURAL Performed at [place] [address], [place], [place] [address]    Special Requests   Final    NONE Performed at [facility], [place] [address] [place], [place] [address]    Gram Stain   Final    FEW SQUAMOUS  EPITHELIAL CELLS PRESENT FEW WBC SEEN NO ORGANISMS SEEN    Culture   Final    NO GROWTH 3 DAYS Performed at [facility], [address], [place], [place] [address]    Report Status 12/09/2020 FINAL  Final      Radiology Studies: ECHOCARDIOGRAM COMPLETE  Result Date: 12/08/2020    ECHOCARDIOGRAM REPORT   Patient Name:   DAVELYN GWINN Rakers Date of Exam: 12/08/2020 Medical Rec #:  [id number]              Height:       62.0 in Accession #:    [id number]             Weight:       126.8 lb Date of Birth:  [birth year]  BSA:          1.575 m Patient Age:    79 years               BP:           97/57 mmHg Patient Gender: F                      HR:           120 bpm. Exam Location:  Inpatient Procedure: 2D Echo, Color Doppler and Cardiac Doppler Indications:    F02.63 Acute diastolic (congestive) heart failure  History:        Patient has prior history of Echocardiogram examinations, most                 recent 09/14/2020. CHF; COPD. Recent COVID Infection.  Sonographer:    Raquel Sarna Senior RDCS Referring Phys: [id number] Patrecia Pour  Sonographer Comments: Technically difficult due to COPD IMPRESSIONS  1. ? patient in rapid flutter during exam . Left ventricular ejection fraction, by estimation, is 60 to 65%. The left ventricle has normal function. The left ventricle has no regional wall motion abnormalities. Left ventricular diastolic parameters are indeterminate.  2. Right ventricular systolic function is normal. The right ventricular size is normal.  3. The mitral valve is normal in structure. No evidence of mitral valve regurgitation. No evidence of mitral stenosis.  4. The aortic valve is tricuspid. Aortic valve regurgitation is not visualized. Mild to moderate aortic valve sclerosis/calcification is present, without any evidence of aortic stenosis.  5. The inferior vena cava is normal in size with greater than 50% respiratory variability, suggesting right atrial pressure of 3 mmHg. FINDINGS  Left  Ventricle: ? patient in rapid flutter during exam. Left ventricular ejection fraction, by estimation, is 60 to 65%. The left ventricle has normal function. The left ventricle has no regional wall motion abnormalities. The left ventricular internal cavity size was normal in size. There is no left ventricular hypertrophy. Left ventricular diastolic parameters are indeterminate. Right Ventricle: The right ventricular size is normal. No increase in right ventricular wall thickness. Right ventricular systolic function is normal. Left Atrium: Left atrial size was normal in size. Right Atrium: Right atrial size was normal in size. Pericardium: There is no evidence of pericardial effusion. Mitral Valve: The mitral valve is normal in structure. No evidence of mitral valve regurgitation. No evidence of mitral valve stenosis. Tricuspid Valve: The tricuspid valve is normal in structure. Tricuspid valve regurgitation is trivial. No evidence of tricuspid stenosis. Aortic Valve: The aortic valve is tricuspid. Aortic valve regurgitation is not visualized. Mild to moderate aortic valve sclerosis/calcification is present, without any evidence of aortic stenosis. Pulmonic Valve: The pulmonic valve was normal in structure. Pulmonic valve regurgitation is not visualized. No evidence of pulmonic stenosis. Aorta: The aortic root is normal in size and structure. Venous: The inferior vena cava is normal in size with greater than 50% respiratory variability, suggesting right atrial pressure of 3 mmHg. IAS/Shunts: The interatrial septum was not well visualized.  LEFT VENTRICLE PLAX 2D LVIDd:         2.90 cm LVIDs:         1.90 cm LV PW:         0.90 cm LV IVS:        1.00 cm LVOT diam:     1.80 cm LV SV:         34 LV SV Index:  22 LVOT Area:     2.54 cm  RIGHT VENTRICLE RV S prime:     7.83 cm/s TAPSE (M-mode): 1.0 cm LEFT ATRIUM           Index       RIGHT ATRIUM           Index LA diam:      3.20 cm 2.03 cm/m  RA Area:     10.90 cm LA  Vol (A2C): 17.8 ml 11.30 ml/m RA Volume:   21.00 ml  13.33 ml/m LA Vol (A4C): 44.3 ml 28.13 ml/m  AORTIC VALVE LVOT Vmax:   69.80 cm/s LVOT Vmean:  56.700 cm/s LVOT VTI:    0.135 m  AORTA Ao Root diam: 3.20 cm  SHUNTS Systemic VTI:  0.14 m Systemic Diam: 1.80 cm Jenkins Rouge MD Electronically signed by Jenkins Rouge MD Signature Date/Time: 12/08/2020/1:13:34 PM    Final     Scheduled Meds:  albuterol  2 puff Inhalation BID   Chlorhexidine Gluconate Cloth  6 each Topical Q0600   digoxin  0.125 mg Oral Daily   enoxaparin (LOVENOX) injection  1 mg/kg Subcutaneous Q12H   feeding supplement  237 mL Oral BID BM   flecainide  75 mg Oral BID   fluticasone furoate-vilanterol  1 puff Inhalation Daily   And   umeclidinium bromide  1 puff Inhalation Daily   metoprolol tartrate  12.5 mg Oral BID   mirtazapine  7.5 mg Oral QHS   multivitamin with minerals  1 tablet Oral Daily   sodium chloride flush  3 mL Intravenous Q12H   Continuous Infusions:  sodium chloride     ceFEPime (MAXIPIME) IV 2 g (12/08/20 2115)   lactated ringers 75 mL/hr at 12/09/20 0600   vancomycin 500 mg (12/08/20 1239)     LOS: 6 days   Time spent: 35 minutes.  Little Ishikawa, DO  [facility] Pager: Secure chat After 7P - contact on [email or web address] 12/09/2020, 7:51 AM

## 2020-12-10 DIAGNOSIS — I471 Supraventricular tachycardia: Secondary | ICD-10-CM | POA: Diagnosis not present

## 2020-12-10 LAB — CBC
HCT: 35.5 % — ABNORMAL LOW (ref 36.0–46.0)
Hemoglobin: 11.4 g/dL — ABNORMAL LOW (ref 12.0–15.0)
MCH: 29 pg (ref 26.0–34.0)
MCHC: 32.1 g/dL (ref 30.0–36.0)
MCV: 90.3 fL (ref 80.0–100.0)
Platelets: 341 10*3/uL (ref 150–400)
RBC: 3.93 MIL/uL (ref 3.87–5.11)
RDW: 13.9 % (ref 11.5–15.5)
WBC: 14.7 10*3/uL — ABNORMAL HIGH (ref 4.0–10.5)
nRBC: 0 % (ref 0.0–0.2)

## 2020-12-10 LAB — CULTURE, BLOOD (ROUTINE X 2)
Culture: NO GROWTH
Culture: NO GROWTH
Special Requests: ADEQUATE
Special Requests: ADEQUATE

## 2020-12-10 LAB — BASIC METABOLIC PANEL
Anion gap: 8 (ref 5–15)
BUN: 25 mg/dL — ABNORMAL HIGH (ref 8–23)
CO2: 35 mmol/L — ABNORMAL HIGH (ref 22–32)
Calcium: 8.4 mg/dL — ABNORMAL LOW (ref 8.9–10.3)
Chloride: 91 mmol/L — ABNORMAL LOW (ref 98–111)
Creatinine, Ser: 0.68 mg/dL (ref 0.44–1.00)
GFR, Estimated: >60 mL/min (ref >60)
Glucose, Bld: 93 mg/dL (ref 70–99)
Potassium: 4.6 mmol/L (ref 3.5–5.1)
Sodium: 134 mmol/L — ABNORMAL LOW (ref 135–145)

## 2020-12-10 NOTE — Progress Notes (Addendum)
Progress Note  Patient Name: Samantha Fitzgerald Date of Encounter: 12/10/2020  [facility] Cardiologist: [facility] cardiology   Subjective   Patient is hard of hearing, denied any chest pain, heart palpitation, SOB, dizziness this morning. She is limited for history telling. Daughter is at bedside, felt patient is much weaker than usual.   Inpatient Medications    Scheduled Meds:  albuterol  2 puff Inhalation BID   Chlorhexidine Gluconate Cloth  6 each Topical Q0600   digoxin  0.125 mg Oral Daily   enoxaparin (LOVENOX) injection  1 mg/kg Subcutaneous Q12H   feeding supplement  237 mL Oral BID BM   flecainide  75 mg Oral BID   fluticasone furoate-vilanterol  1 puff Inhalation Daily   And   umeclidinium bromide  1 puff Inhalation Daily   metoprolol tartrate  12.5 mg Oral BID   mirtazapine  7.5 mg Oral QHS   multivitamin with minerals  1 tablet Oral Daily   sodium chloride flush  3 mL Intravenous Q12H   Continuous Infusions:  sodium chloride     ceFEPime (MAXIPIME) IV 2 g (12/09/20 2205)   vancomycin 500 mg (12/09/20 1224)   PRN Meds: sodium chloride, acetaminophen **OR** acetaminophen, albuterol, sodium chloride flush   Vital Signs    Vitals:   12/09/20 1633 12/09/20 2122 12/10/20 0433 12/10/20 0437  BP:  (!) 150/90  (!) 156/71  Pulse:  92  73  Resp:  16  20  Temp:  98.1 F (36.7 C)  98 F (36.7 C)  TempSrc:  Oral  Axillary  SpO2: 99% 94%  99%  Weight:   59.5 kg   Height:        Intake/Output Summary (Last 24 hours) at 12/10/2020 0902 Last data filed at 12/09/2020 1804 Gross per 24 hour  Intake 1063.48 ml  Output 700 ml  Net 363.48 ml   Last 3 Weights 12/10/2020 12/09/2020 12/08/2020  Weight (lbs) 131 lb 2.8 oz 132 lb 15 oz 126 lb 12.2 oz  Weight (kg) 59.5 kg 60.3 kg 57.5 kg      Telemetry    Sinus rhythm 100s this morning, SR 70-100s over the past 24 hours, artifacts, PACs, no SVT noted - Personally Reviewed  ECG    N/A this AM - Personally  Reviewed  Physical Exam   GEN: No acute distress.  Frail elderly  Neck: No JVD Cardiac: RRR, no murmurs, rubs, or gallops.  Respiratory: Clear but diminished at base auscultation bilaterally.On room air.  GI: Soft, nontender, non-distended  MS: BLE in Kerlix wraps/not removed for exam  Neuro:  Alert, oriented to self/place/person, follow one step commands and answers yes and no questions appropriately, global decondition and weakness  Psych: Normal affect   Labs    High Sensitivity Troponin:   Recent Labs  Lab 12/03/20 2128 12/04/20 0154 12/04/20 0415  TROPONINIHS 96* 109* 116*     Chemistry Recent Labs  Lab 12/03/20 2128 12/04/20 0420 12/05/20 0335 12/06/20 0355 12/07/20 0341 12/08/20 0354 12/09/20 0318 12/10/20 0326  NA  --  138   < > 138 136 135 138 134*  K  --  4.2   < > 3.3* 3.7 4.4 4.6 4.6  CL  --  98   < > 97* 94* 90* 90* 91*  CO2  --  33*   < > 35* 31 37* 39* 35*  GLUCOSE  --  83   < > 105* 99 143* 99 93  BUN  --  18   < > 17 18 24* 29* 25*  CREATININE  --  0.86   < > 0.64 0.60 0.67 0.69 0.68  CALCIUM  --  8.6*   < > 8.2* 8.1* 8.7* 8.9 8.4*  MG 1.8 1.8  --   --   --   --  2.2  --   PROT  --  5.7*   < > 5.3* 5.1* 5.6*  --   --   ALBUMIN  --  2.7*   < > 2.5* 2.4* 2.4*  --   --   AST  --  20   < > 14* 18 28  --   --   ALT  --  12   < > 10 12 17   --   --   ALKPHOS  --  78   < > 77 76 80  --   --   BILITOT  --  0.6   < > 0.5 0.4 0.6  --   --   GFRNONAA  --  >60   < > >60 >60 >60 >60 >60  ANIONGAP  --  7   < > 6 11 8 9 8    < > = values in this interval not displayed.    Lipids No results for input(s): CHOL, TRIG, HDL, LABVLDL, LDLCALC, CHOLHDL in the last 168 hours.  Hematology Recent Labs  Lab 12/08/20 0354 12/09/20 0318 12/10/20 0326  WBC 15.3* 13.1* 14.7*  RBC 4.06 3.95 3.93  HGB 11.6* 11.2* 11.4*  HCT 36.4 35.8* 35.5*  MCV 89.7 90.6 90.3  MCH 28.6 28.4 29.0  MCHC 31.9 31.3 32.1  RDW 14.0 14.0 13.9  PLT 341 310 341   Thyroid  Recent Labs   Lab 12/04/20 0420  TSH 2.915    BNP Recent Labs  Lab 12/03/20 1055  BNP 1,334.5*    DDimer  Recent Labs  Lab 12/04/20 0420  DDIMER 2.88*     Radiology    ECHOCARDIOGRAM COMPLETE  Result Date: 12/08/2020    ECHOCARDIOGRAM REPORT   Patient Name:   Samantha Fitzgerald Date of Exam: 12/08/2020 Medical Rec #:  [id number]              Height:       62.0 in Accession #:    [id number]             Weight:       126.8 lb Date of Birth:  [birth year]               BSA:          1.575 m Patient Age:    87 years               BP:           97/57 mmHg Patient Gender: F                      HR:           120 bpm. Exam Location:  Inpatient Procedure: 2D Echo, Color Doppler and Cardiac Doppler Indications:    F57.32 Acute diastolic (congestive) heart failure  History:        Patient has prior history of Echocardiogram examinations, most                 recent 09/14/2020. CHF; COPD. Recent COVID Infection.  Sonographer:    Raquel Sarna Senior RDCS Referring Phys: [place] Comments: Technically  difficult due to COPD IMPRESSIONS  1. ? patient in rapid flutter during exam . Left ventricular ejection fraction, by estimation, is 60 to 65%. The left ventricle has normal function. The left ventricle has no regional wall motion abnormalities. Left ventricular diastolic parameters are indeterminate.  2. Right ventricular systolic function is normal. The right ventricular size is normal.  3. The mitral valve is normal in structure. No evidence of mitral valve regurgitation. No evidence of mitral stenosis.  4. The aortic valve is tricuspid. Aortic valve regurgitation is not visualized. Mild to moderate aortic valve sclerosis/calcification is present, without any evidence of aortic stenosis.  5. The inferior vena cava is normal in size with greater than 50% respiratory variability, suggesting right atrial pressure of 3 mmHg. FINDINGS  Left Ventricle: ? patient in rapid flutter during exam. Left ventricular  ejection fraction, by estimation, is 60 to 65%. The left ventricle has normal function. The left ventricle has no regional wall motion abnormalities. The left ventricular internal cavity size was normal in size. There is no left ventricular hypertrophy. Left ventricular diastolic parameters are indeterminate. Right Ventricle: The right ventricular size is normal. No increase in right ventricular wall thickness. Right ventricular systolic function is normal. Left Atrium: Left atrial size was normal in size. Right Atrium: Right atrial size was normal in size. Pericardium: There is no evidence of pericardial effusion. Mitral Valve: The mitral valve is normal in structure. No evidence of mitral valve regurgitation. No evidence of mitral valve stenosis. Tricuspid Valve: The tricuspid valve is normal in structure. Tricuspid valve regurgitation is trivial. No evidence of tricuspid stenosis. Aortic Valve: The aortic valve is tricuspid. Aortic valve regurgitation is not visualized. Mild to moderate aortic valve sclerosis/calcification is present, without any evidence of aortic stenosis. Pulmonic Valve: The pulmonic valve was normal in structure. Pulmonic valve regurgitation is not visualized. No evidence of pulmonic stenosis. Aorta: The aortic root is normal in size and structure. Venous: The inferior vena cava is normal in size with greater than 50% respiratory variability, suggesting right atrial pressure of 3 mmHg. IAS/Shunts: The interatrial septum was not well visualized.  LEFT VENTRICLE PLAX 2D LVIDd:         2.90 cm LVIDs:         1.90 cm LV PW:         0.90 cm LV IVS:        1.00 cm LVOT diam:     1.80 cm LV SV:         34 LV SV Index:   22 LVOT Area:     2.54 cm  RIGHT VENTRICLE RV S prime:     7.83 cm/s TAPSE (M-mode): 1.0 cm LEFT ATRIUM           Index       RIGHT ATRIUM           Index LA diam:      3.20 cm 2.03 cm/m  RA Area:     10.90 cm LA Vol (A2C): 17.8 ml 11.30 ml/m RA Volume:   21.00 ml  13.33 ml/m LA  Vol (A4C): 44.3 ml 28.13 ml/m  AORTIC VALVE LVOT Vmax:   69.80 cm/s LVOT Vmean:  56.700 cm/s LVOT VTI:    0.135 m  AORTA Ao Root diam: 3.20 cm  SHUNTS Systemic VTI:  0.14 m Systemic Diam: 1.80 cm Jenkins Rouge MD Electronically signed by Jenkins Rouge MD Signature Date/Time: 12/08/2020/1:13:34 PM    Final     Cardiac Studies  Echo from 12/08/20:   1. ? patient in rapid flutter during exam . Left ventricular ejection  fraction, by estimation, is 60 to 65%. The left ventricle has normal  function. The left ventricle has no regional wall motion abnormalities.  Left ventricular diastolic parameters are indeterminate.   2. Right ventricular systolic function is normal. The right ventricular  size is normal.   3. The mitral valve is normal in structure. No evidence of mitral valve  regurgitation. No evidence of mitral stenosis.   4. The aortic valve is tricuspid. Aortic valve regurgitation is not  visualized. Mild to moderate aortic valve sclerosis/calcification is  present, without any evidence of aortic stenosis.   5. The inferior vena cava is normal in size with greater than 50%  respiratory variability, suggesting right atrial pressure of 3 mmHg.   Patient Profile     87 y.o. female with PMH of PSVT versus junctional tachycardia, tachy-brady syndrome , oxygen dependent COPD 2LNC, COVID-19 June/July 2022, who is admitted sepsis 2/2 MDR proteus and enterococcus faecalis UTI , LLE cellulitis, MDR staph epidermidis bacteremia, acute on chronic hypoxic respiratory failure, pleural effusions (R > L), worsening diastolic CHF, acute DVT of right common femoral vein and SF junction. Cardiology is consulted and following since 12/08/20 for SVTs.   Assessment & Plan    PSVT -history of PSVT versus junctional tachycardia followed by [facility] cardiology. Historically on metoprolol, digoxin, flecainide PTA. EP study and ablation was discussed outpatient and patient/provider decision was made to pursue medical  /conservative management due to advanced age and comorbidity.  - continue digoxin 0.125mg , flecainide 75mg  BID, and metoprolol 12.5 mg BID ( BB resumed 9/24), additional digoxin 0.125mg  x1 was given for SVTs/may repeat if needed in the setting of SVT with hypotension in the future  - digoxin level was therapeutic 9/19, flecainide level pending  - mediated by acute systemic illness as listed above, continue treatment per IM  - optimize electrolytes, goal K >4 and Mag >2  - encourage PO hydration and intake   Acute on chronic diastolic heart failure - on lasix 20mg  daily PTA - Echo above  - CXR at admission with  worsening pleural effusions, s/p L thoracentesis 9/22 with 852ml removed  - initially given diuretics at admission, now held  - clinically with mild dehydration today, on room air  - recommend continue hold PO diuretics until euvolemic with adequate PO intake   Sepsis MDR proteus and enterococcus faecalis UTI  LLE cellulitis MDR staph epidermidis bacteremia Acute on chronic hypoxic respiratory failure Bilateral pleural effusions  Acute DVT of right common femoral vein and SF junction COPD - managed per IM     For questions or updates, please contact [facility] Please consult [email or web address] for contact info under        Signed, Margie Billet, NP  12/10/2020, 9:02 AM     Patient seen and examined with Margie Billet NP.  Agree as above, with the following exceptions and changes as noted below. No family at bedside. Patient resting comfortably. Gen: NAD, CV: RRR, no murmurs, Lungs: clear, Abd: soft, Extrem: Warm, well perfused, no edema, Neuro/Psych: resting, no distress All available labs, radiology testing, previous records reviewed. No change to current medications since no recurrence of SVT on telemetry. Cardiology will sign off at this time, discussed with primary service.  Elouise Munroe, MD 12/10/20 2:13 PM

## 2020-12-10 NOTE — Care Management Important Message (Signed)
Important Message  Patient Details IM Letter given to the Patient. Name: Samantha Fitzgerald MRN: [id number] Date of Birth: [birth year]   Medicare Important Message Given:  Yes     Kerin Salen 12/10/2020, 1:32 PM

## 2020-12-10 NOTE — Progress Notes (Signed)
Physical Therapy Treatment Patient Details Name: Samantha Fitzgerald MRN: [id number] DOB: Aug 06, 1924 Today's Date: 12/10/2020   History of Present Illness Patient is 87 y.o. female with PMH for COPD on 2L nasal cannula, sick sinus syndrome, CHF admitted for shortness of breath and dry cough.  Found to have large left-sided pleural effusion, hx of recent COVID-19 positive and Severe Sepsis secondary to UTI    PT Comments    Pt assisted with sitting EOB and requiring increased assist today (more then previous session).  Pt attempting to return to supine with trunk a couple times and not communicating needs well so assisted back to bed.  Pt then able to report dizziness and SOB, with SPO2 70% on 6L HFNC upon returning to supine.  RN into room and assisted with changing pulse ox monitor and cleaning pt's nostrils for improved readings.   Pt may need SNF upon d/c if ALF is unable to provide physical assist.   Recommendations for follow up therapy are one component of a multi-disciplinary discharge planning process, led by the attending physician.  Recommendations may be updated based on patient status, additional functional criteria and insurance authorization.  Follow Up Recommendations  SNF     Equipment Recommendations  None recommended by PT    Recommendations for Other Services       Precautions / Restrictions Precautions Precautions: Fall Precaution Comments: currently on 6L HFNC (2L O2 baseline)     Mobility  Bed Mobility Overal bed mobility: Needs Assistance Bed Mobility: Supine to Sit;Sit to Supine     Supine to sit: Max assist;HOB elevated Sit to supine: Max assist   General bed mobility comments: pt attempting to assist however feeling weak and dizzy upon returning to bed, pt attempting to return trunk to bed to rest twice and required assist to return to upright sitting position, SpO2 86% on 6L HFNC, pt fatigued quickly and reported dizziness so assisted back to bed,  SPO2 70% on 6L HFNC upon returning to bed so RN called into room and increased to 9L; pt with dry blood around nose; pt assisted with blowing nose and RN into room to perform nasal care; SPO2 quickly back into upper 90s thereafter    Transfers                 General transfer comment: pt felt unable this visit  Ambulation/Gait                 Stairs             Wheelchair Mobility    Modified Rankin (Stroke Patients Only)       Balance Overall balance assessment: Needs assistance Sitting-balance support: Bilateral upper extremity supported;Feet supported Sitting balance-Leahy Scale: Zero Sitting balance - Comments: pt briefly able to remain upright however fatigue, dizziness and weakness limiting balance today                                    Cognition Arousal/Alertness: Awake/alert Behavior During Therapy: WFL for tasks assessed/performed Overall Cognitive Status: No family/caregiver present to determine baseline cognitive functioning                                 General Comments: pt's cognition does not appear as well as from previous visit however pt awakened from sleep; pt better able to communicate and  answer questions end of session; pt also difficult to understand at times however this therapist recalls from last session that pt states she's orginially from [place] (had mild to moderate accent at times during last session)      Exercises      General Comments        Pertinent Vitals/Pain Pain Assessment: No/denies pain    Home Living                      Prior Function            PT Goals (current goals can now be found in the care plan section) Progress towards PT goals: Not progressing toward goals - comment (weaker, dizzy, drop in saturations with activity)    Frequency    Min 2X/week      PT Plan Current plan remains appropriate    Co-evaluation              AM-PAC PT "6  Clicks" Mobility   Outcome Measure  Help needed turning from your back to your side while in a flat bed without using bedrails?: A Lot Help needed moving from lying on your back to sitting on the side of a flat bed without using bedrails?: A Lot Help needed moving to and from a bed to a chair (including a wheelchair)?: Total Help needed standing up from a chair using your arms (e.g., wheelchair or bedside chair)?: Total Help needed to walk in hospital room?: Total Help needed climbing 3-5 steps with a railing? : Total 6 Click Score: 8    End of Session Equipment Utilized During Treatment: Gait belt;Oxygen Activity Tolerance: Patient limited by fatigue Patient left: in bed;with call bell/phone within reach;with nursing/sitter in room Nurse Communication: Mobility status PT Visit Diagnosis: Difficulty in walking, not elsewhere classified (R26.2);Muscle weakness (generalized) (M62.81)     Time: 5625-6389 PT Time Calculation (min) (ACUTE ONLY): 25 min  Charges:  $Therapeutic Activity: 23-37 mins                    Jannette Spanner PT, DPT Acute Rehabilitation Services Pager: [phone] Office: [place] 12/10/2020, 3:40 PM

## 2020-12-10 NOTE — Progress Notes (Signed)
ANTICOAGULATION CONSULT NOTE - Initial Consult  Pharmacy Consult for enoxaparin Indication: DVT  Allergies  Allergen Reactions   Amoxicillin Other (See Comments)    Listed on MAR   Doxycycline Other (See Comments)    Listed on MAR   Levofloxacin Other (See Comments)    Listed on MAR   Morphine And Related Other (See Comments)    Listed on [facility]    Patient Measurements: Height: 5\' 2"  (157.5 cm) Weight: 59.5 kg (131 lb 2.8 oz) IBW/kg (Calculated) : 50.1  Vital Signs: Temp: 98 F (36.7 C) (09/26 0437) Temp Source: Axillary (09/26 0437) BP: 156/71 (09/26 0437) Pulse Rate: 73 (09/26 0437)  Labs: Recent Labs    12/08/20 0354 12/09/20 0318 12/10/20 0326  HGB 11.6* 11.2* 11.4*  HCT 36.4 35.8* 35.5*  PLT 341 310 341  CREATININE 0.67 0.69 0.68     Estimated Creatinine Clearance: 34 mL/min (by C-G formula based on SCr of 0.68 mg/dL).   Medical History: History reviewed. No pertinent past medical history.  Medications:  Scheduled:   albuterol  2 puff Inhalation BID   Chlorhexidine Gluconate Cloth  6 each Topical Q0600   digoxin  0.125 mg Oral Daily   enoxaparin (LOVENOX) injection  1 mg/kg Subcutaneous Q12H   feeding supplement  237 mL Oral BID BM   flecainide  75 mg Oral BID   fluticasone furoate-vilanterol  1 puff Inhalation Daily   And   umeclidinium bromide  1 puff Inhalation Daily   metoprolol tartrate  12.5 mg Oral BID   mirtazapine  7.5 mg Oral QHS   multivitamin with minerals  1 tablet Oral Daily   sodium chloride flush  3 mL Intravenous Q12H   Infusions:   sodium chloride     ceFEPime (MAXIPIME) IV 2 g (12/09/20 2205)   vancomycin 500 mg (12/09/20 1224)    Assessment: 87 yo female presenting with hypotension, general weakness, low BP, and bilateral lower extremity edema.  No anticoagulation noted PTA. Found to have acute DVT of the R common femoral vein and SF junction.  Pharmacy consulted to dose enoxaparin for DVT treatment.  Enoxaparin was chosen  over heparin to limit the amount of extra fluid patient is receiving due to edema.   D-dimer 2.88, fibrinogen 529.  INR 1.1 at baseline TBW = 59 kg; BMI 23  Today, 12/10/20 CBC: Hgb low but stable; Plt WNL SCr WNL & stable; CrCl ~34 mL/min Enoxaparin was held on 9/21 PM for red-tinged effusion with thoracentesis; resumed on 9/22 AM with no further bleeding reported  Goal of Therapy:  Anti-Xa level 0.6-1 units/ml 4hrs after LMWH dose given as necessary Monitor platelets by anticoagulation protocol: Yes   Plan:  Continue enoxaparin 1 mg/kg (60 mg) subQ q12h Continue to monitor renal function and adjust dose as needed Monitor CBC, signs of bleeding Follow up on long term anticoagulation plan  Napoleon Form, PharmD 12/10/20 8:26 AM

## 2020-12-10 NOTE — Progress Notes (Signed)
PROGRESS NOTE  Memorie Yokoyama  ZOX:[id number] DOB: Sep 14, 1924 DOA: 12/03/2020 PCP: System, Provider Not In   Brief Narrative: Julyana Woolverton is a 87 y.o. female with medical history significant of COPD 2 L nasal cannula, sick sinus syndrome and SVT, Grade 1 diastolic HF, with recurrent large left-sided pleural effusion, recently diagnosed with covid June/July 2022.  Patient presents to the ED from [facility] facility with hypotension general weakness with reportedly acute onset blood pressure noted to be as low as 80/60 with bilateral lower extremity edema and worsening hypoxic respiratory failure. She was found to have severe sepsis with Proteus UTI, LLE cellulitis, and volume overload with pleural effusion. Thoracentesis was performed and diuresis begun with improvement in respiratory status. On 9/24, the patient developed SVT terminated with metoprolol IV.  Assessment & Plan:  Severe sepsis secondary to polymicrobial UTI, POA, ongoing:  MDR Staph epidermidis bacteremia suspected Rule out concurrent cellulitis of LLE - Leukocytosis, tachycardia with multiple sources of infection and concurrent mental status changes/lactic acidosis meet criteria for severe sepsis  - Proteus with resistance noted on cultures, continue cefepime.  - Resistant Enterococcus covered by vancomycin as below - Leukocytosis stabilizing  - Cefepime and vancomycin being given. Vanc added for GPC's discussed below. - 2 aerobic bottles out of 4 total bottles collected on 9/19 from right AC, left forearm.  Repeat cultures preliminary negative - Continue vancomycin. With ongoing leukocytosis, no vegetations on echo. - Repeat blood cultures 9/21 remain negative  Acute metabolic encephalopathy, POA  -Appears to be worsening over the past 72 hours, likely multifactorial in setting of infection, sundowning/hospital delirium, polypharmacy. -Without focal deficits but patient remains quite somnolent with somewhat  inappropriate answers today -Attempt to improve sleep hygiene, continue infection treatment as above, recommend family visitation/at bedside as possible to help reorient the patient as she was able to recognize her daughter today without difficulty.  Acute on chronic hypoxic respiratory failure:  - Likely related to symptomatic pleural effusions (R > L) - Left Thoracentesis 12/05/2020 with 800cc red-tinged w/protein <3, cyto pending, with negative gram stain. Thora last done 6/30 with 850cc removed. - Continue to titrate oxygen down to baseline 2 L nasal cannula    Acute on chronic diastolic CHF: - IVF discontinued - Continue to hold diuretics in the setting of poor p.o. intake, monitor for dehydration -we will likely resume in the next few days pending p.o. tolerance and volume status.     Acute DVT of the right common femoral vein and SF junction:  - Continue lovenox 1mg /kg q12h -further discussion with family and patient about risks of full dose anticoagulation in the setting of a patient with high fall risk and ambulatory dysfunction - Benefits of anticoagulation in the hospital and at SNF while monitored certainly outweigh the risks, given patient's independent living situation however this may need to be readdressed after rehab if she is a high fall risk  SVT, transient, resolved:  - Converted to NSR after recurrent digoxin dose - continue digoxin (level 1.4) and flecainide.  - Metoprolol was held due to hypotension initially, continue low dose - Cardiology following - appreciate insight/recs   COPD: Not in acute exacerbation.  - Continue inhaled therapies.    History of covid-19 infection: Treated with remdesivir and steroids. No pulmonary symptoms, infiltrate on CXR. CT is 42 not consistent with active infection.  - No isolation/Tx required.    Hypokalemia/Hypomagnesemia:  - Continue monitoring.   DVT prophylaxis: Lovenox treatment dose - 1mg /kg q12h  Code Status: DNR Family  Communication: None present Disposition Plan:  Status is: Inpatient  Remains inpatient appropriate because:Ongoing diagnostic testing needed not appropriate for outpatient work up and Inpatient level of care appropriate due to severity of illness  Dispo: The patient is from: ALF              Anticipated d/c is to: ALF vs. SNF depending on mobility improvements during admission.              Patient currently is not medically stable to d/c.   Consultants:  Cardiology, IR  Procedures:  U/S-guided thoracentesis on left 12/05/2020 by Dr. Serafina Royals.  Antimicrobials: Vancomycin 9/19, then 9/21 >> Ceftriaxone 9/19 - 9/20 Cefepime 9/19, then 9/21 >>  Subjective: No acute issues or events overnight pleasantly confused at bedside, able to recognize daughter who is present at bedside as well.  Difficulty with review of systems and orientation questions but denies nausea vomiting diarrhea constipation headache fever chills or chest pain  Objective: Vitals:   12/09/20 1633 12/09/20 2122 12/10/20 0433 12/10/20 0437  BP:  (!) 150/90  (!) 156/71  Pulse:  92  73  Resp:  16  20  Temp:  98.1 F (36.7 C)  98 F (36.7 C)  TempSrc:  Oral  Axillary  SpO2: 99% 94%  99%  Weight:   59.5 kg   Height:        Intake/Output Summary (Last 24 hours) at 12/10/2020 0733 Last data filed at 12/09/2020 1804 Gross per 24 hour  Intake 1063.48 ml  Output 700 ml  Net 363.48 ml    Filed Weights   12/08/20 0500 12/09/20 0425 12/10/20 0433  Weight: 57.5 kg 60.3 kg 59.5 kg   General: Pleasantly resting in bed, No acute distress.  Alert to person only HEENT:  Normocephalic atraumatic.  Sclerae nonicteric, noninjected.  Extraocular movements intact bilaterally. Neck:  Without mass or deformity.  Trachea is midline. Lungs:  Clear to auscultate bilaterally without rhonchi, wheeze, or rales. Heart:  Regular rate and rhythm.  Without murmurs, rubs, or gallops. Abdomen:  Soft, nontender, nondistended.  Without  guarding or rebound. Extremities: Without cyanosis, clubbing, edema Vascular:  Dorsalis pedis and posterior tibial pulses palpable bilaterally. Skin: Bandages clean dry intact  Data Reviewed: I have personally reviewed following labs and imaging studies  CBC: Recent Labs  Lab 12/05/20 0335 12/06/20 0355 12/07/20 0341 12/08/20 0354 12/09/20 0318 12/10/20 0326  WBC 15.8* 12.7* 13.1* 15.3* 13.1* 14.7*  NEUTROABS 13.3* 10.7* 10.3* 12.3* 10.1*  --   HGB 11.2* 10.6* 10.8* 11.6* 11.2* 11.4*  HCT 34.4* 32.2* 33.0* 36.4 35.8* 35.5*  MCV 88.2 87.7 88.7 89.7 90.6 90.3  PLT 342 327 310 341 310 326    Basic Metabolic Panel: Recent Labs  Lab 12/03/20 2128 12/04/20 0420 12/05/20 0335 12/06/20 0355 12/07/20 0341 12/08/20 0354 12/09/20 0318 12/10/20 0326  NA  --  138   < > 138 136 135 138 134*  K  --  4.2   < > 3.3* 3.7 4.4 4.6 4.6  CL  --  98   < > 97* 94* 90* 90* 91*  CO2  --  33*   < > 35* 31 37* 39* 35*  GLUCOSE  --  83   < > 105* 99 143* 99 93  BUN  --  18   < > 17 18 24* 29* 25*  CREATININE  --  0.86   < > 0.64 0.60 0.67 0.69 0.68  CALCIUM  --  8.6*   < > 8.2* 8.1* 8.7* 8.9 8.4*  MG 1.8 1.8  --   --   --   --  2.2  --   PHOS 3.8 3.8  --   --   --   --   --   --    < > = values in this interval not displayed.    GFR: Estimated Creatinine Clearance: 34 mL/min (by C-G formula based on SCr of 0.68 mg/dL). Liver Function Tests: Recent Labs  Lab 12/04/20 0420 12/05/20 0335 12/06/20 0355 12/07/20 0341 12/08/20 0354  AST 20 15 14* 18 28  ALT 12 11 10 12 17   ALKPHOS 78 83 77 76 80  BILITOT 0.6 0.9 0.5 0.4 0.6  PROT 5.7* 5.3* 5.3* 5.1* 5.6*  ALBUMIN 2.7* 2.6* 2.5* 2.4* 2.4*    No results for input(s): LIPASE, AMYLASE in the last 168 hours. No results for input(s): AMMONIA in the last 168 hours. Coagulation Profile: Recent Labs  Lab 12/03/20 1058 12/04/20 0420  INR 1.0 1.1    Cardiac Enzymes: Recent Labs  Lab 12/03/20 2128  CKTOTAL 31*    BNP (last 3  results) No results for input(s): PROBNP in the last 8760 hours. HbA1C: No results for input(s): HGBA1C in the last 72 hours. CBG: No results for input(s): GLUCAP in the last 168 hours. Lipid Profile: No results for input(s): CHOL, HDL, LDLCALC, TRIG, CHOLHDL, LDLDIRECT in the last 72 hours. Thyroid Function Tests: No results for input(s): TSH, T4TOTAL, FREET4, T3FREE, THYROIDAB in the last 72 hours.  Anemia Panel: No results for input(s): VITAMINB12, FOLATE, FERRITIN, TIBC, IRON, RETICCTPCT in the last 72 hours.  Urine analysis:    Component Value Date/Time   COLORURINE STRAW (A) 12/03/2020 1458   APPEARANCEUR TURBID (A) 12/03/2020 1458   LABSPEC  12/03/2020 1458    TEST NOT REPORTED DUE TO COLOR INTERFERENCE OF URINE PIGMENT   PHURINE  12/03/2020 1458    TEST NOT REPORTED DUE TO COLOR INTERFERENCE OF URINE PIGMENT   GLUCOSEU (A) 12/03/2020 1458    TEST NOT REPORTED DUE TO COLOR INTERFERENCE OF URINE PIGMENT   HGBUR (A) 12/03/2020 1458    TEST NOT REPORTED DUE TO COLOR INTERFERENCE OF URINE PIGMENT   BILIRUBINUR (A) 12/03/2020 1458    TEST NOT REPORTED DUE TO COLOR INTERFERENCE OF URINE PIGMENT   KETONESUR (A) 12/03/2020 1458    TEST NOT REPORTED DUE TO COLOR INTERFERENCE OF URINE PIGMENT   PROTEINUR (A) 12/03/2020 1458    TEST NOT REPORTED DUE TO COLOR INTERFERENCE OF URINE PIGMENT   NITRITE (A) 12/03/2020 1458    TEST NOT REPORTED DUE TO COLOR INTERFERENCE OF URINE PIGMENT   LEUKOCYTESUR (A) 12/03/2020 1458    TEST NOT REPORTED DUE TO COLOR INTERFERENCE OF URINE PIGMENT   Recent Results (from the past 240 hour(s))  Culture, blood (Routine x 2)     Status: Abnormal   Collection Time: 12/03/20 10:54 AM   Specimen: BLOOD LEFT FOREARM  Result Value Ref Range Status   Specimen Description   Final    BLOOD LEFT FOREARM Performed at [facility], [place] [address] [place], [place] [address]    Special Requests   Final    BOTTLES DRAWN AEROBIC AND ANAEROBIC  Blood Culture adequate volume Performed at [place] [address] [place], [place] [address]    Culture  Setup Time   Final    GRAM POSITIVE COCCI IN CLUSTERS AEROBIC BOTTLE ONLY Organism  ID to follow CRITICAL RESULT CALLED TO, READ BACK BY AND VERIFIED WITH: A TUCKER PHARMD 1652 12/04/20 A BROWNING    Culture (A)  Final    STAPHYLOCOCCUS EPIDERMIDIS SUSCEPTIBILITIES PERFORMED ON PREVIOUS CULTURE WITHIN THE LAST 5 DAYS. Performed at [facility], [place] [address], [place], [place] [address]    Report Status 12/07/2020 FINAL  Final  Blood Culture ID Panel (Reflexed)     Status: Abnormal   Collection Time: 12/03/20 10:54 AM  Result Value Ref Range Status   Enterococcus faecalis NOT DETECTED NOT DETECTED Final   Enterococcus Faecium NOT DETECTED NOT DETECTED Final   Listeria monocytogenes NOT DETECTED NOT DETECTED Final   Staphylococcus species DETECTED (A) NOT DETECTED Final    Comment: CRITICAL RESULT CALLED TO, READ BACK BY AND VERIFIED WITH: A TUCKER PHARMD 1652 12/04/20 A BROWNING    Staphylococcus aureus (BCID) NOT DETECTED NOT DETECTED Final   Staphylococcus epidermidis DETECTED (A) NOT DETECTED Final    Comment: Methicillin (oxacillin) resistant coagulase negative staphylococcus. Possible blood culture contaminant (unless isolated from more than one blood culture draw or clinical case suggests pathogenicity). No antibiotic treatment is indicated for blood  culture contaminants. CRITICAL RESULT CALLED TO, READ BACK BY AND VERIFIED WITH: A TUCKER PHARMD 1652 12/04/20 A BROWNING    Staphylococcus lugdunensis NOT DETECTED NOT DETECTED Final   Streptococcus species NOT DETECTED NOT DETECTED Final   Streptococcus agalactiae NOT DETECTED NOT DETECTED Final   Streptococcus pneumoniae NOT DETECTED NOT DETECTED Final   Streptococcus pyogenes NOT DETECTED NOT DETECTED Final   A.calcoaceticus-baumannii NOT DETECTED NOT DETECTED Final   Bacteroides fragilis  NOT DETECTED NOT DETECTED Final   Enterobacterales NOT DETECTED NOT DETECTED Final   Enterobacter cloacae complex NOT DETECTED NOT DETECTED Final   Escherichia coli NOT DETECTED NOT DETECTED Final   Klebsiella aerogenes NOT DETECTED NOT DETECTED Final   Klebsiella oxytoca NOT DETECTED NOT DETECTED Final   Klebsiella pneumoniae NOT DETECTED NOT DETECTED Final   Proteus species NOT DETECTED NOT DETECTED Final   Salmonella species NOT DETECTED NOT DETECTED Final   Serratia marcescens NOT DETECTED NOT DETECTED Final   Haemophilus influenzae NOT DETECTED NOT DETECTED Final   Neisseria meningitidis NOT DETECTED NOT DETECTED Final   Pseudomonas aeruginosa NOT DETECTED NOT DETECTED Final   Stenotrophomonas maltophilia NOT DETECTED NOT DETECTED Final   Candida albicans NOT DETECTED NOT DETECTED Final   Candida auris NOT DETECTED NOT DETECTED Final   Candida glabrata NOT DETECTED NOT DETECTED Final   Candida krusei NOT DETECTED NOT DETECTED Final   Candida parapsilosis NOT DETECTED NOT DETECTED Final   Candida tropicalis NOT DETECTED NOT DETECTED Final   Cryptococcus neoformans/gattii NOT DETECTED NOT DETECTED Final   Methicillin resistance mecA/C DETECTED (A) NOT DETECTED Final    Comment: CRITICAL RESULT CALLED TO, READ BACK BY AND VERIFIED WITHAustin Miles PHARMD 1652 12/04/20 A BROWNING Performed at [facility], [address] [place], [place] [address]   Culture, blood (Routine x 2)     Status: Abnormal   Collection Time: 12/03/20 10:59 AM   Specimen: BLOOD  Result Value Ref Range Status   Specimen Description   Final    BLOOD RIGHT ANTECUBITAL Performed at [place] [address], [place], [place] [address]    Special Requests   Final    BOTTLES DRAWN AEROBIC AND ANAEROBIC Blood Culture adequate volume Performed at [place] [address] [place], [place] [address]  Culture  Setup Time   Final    GRAM POSITIVE COCCI IN  CLUSTERS AEROBIC BOTTLE ONLY CRITICAL VALUE NOTED.  VALUE IS CONSISTENT WITH PREVIOUSLY REPORTED AND CALLED VALUE. Performed at [facility], [place] [address], [place], [place] [address]    Culture STAPHYLOCOCCUS EPIDERMIDIS (A)  Final   Report Status 12/07/2020 FINAL  Final   Organism ID, Bacteria STAPHYLOCOCCUS EPIDERMIDIS  Final      Susceptibility   Staphylococcus epidermidis - MIC*    CIPROFLOXACIN >=8 RESISTANT Resistant     ERYTHROMYCIN >=8 RESISTANT Resistant     GENTAMICIN >=16 RESISTANT Resistant     OXACILLIN >=4 RESISTANT Resistant     TETRACYCLINE 2 SENSITIVE Sensitive     VANCOMYCIN 4 SENSITIVE Sensitive     TRIMETH/SULFA 80 RESISTANT Resistant     CLINDAMYCIN >=8 RESISTANT Resistant     RIFAMPIN <=0.5 SENSITIVE Sensitive     Inducible Clindamycin NEGATIVE Sensitive     * STAPHYLOCOCCUS EPIDERMIDIS  Resp Panel by RT-PCR (Flu A&B, Covid) Nasopharyngeal Swab     Status: Abnormal   Collection Time: 12/03/20 12:03 PM   Specimen: Nasopharyngeal Swab; Nasopharyngeal(NP) swabs in vial transport medium  Result Value Ref Range Status   SARS Coronavirus 2 by RT PCR POSITIVE (A) NEGATIVE Final    Comment: RESULT CALLED TO, READ BACK BY AND VERIFIED WITH: JACOBS,D. RN @1310  ON 12/03/2020 BY COHEN,K (NOTE) SARS-CoV-2 target nucleic acids are DETECTED.  The SARS-CoV-2 RNA is generally detectable in upper respiratory specimens during the acute phase of infection. Positive results are indicative of the presence of the identified virus, but do not rule out bacterial infection or co-infection with other pathogens not detected by the test. Clinical correlation with patient history and other diagnostic information is necessary to determine patient infection status. The expected result is Negative.  Fact Sheet for Patients: EntrepreneurPulse.com.au  Fact Sheet for Healthcare Providers: IncredibleEmployment.be  This test is not yet approved  or cleared by the [place] FDA and  has been authorized for detection and/or diagnosis of SARS-CoV-2 by FDA under an Emergency Use Authorization (EUA).  This EUA will remain in effect (meaning this t est can be used) for the duration of  the COVID-19 declaration under Section 564(b)(1) of the Act, 21 U.S.C. section 360bbb-3(b)(1), unless the authorization is terminated or revoked sooner.     Influenza A by PCR NEGATIVE NEGATIVE Final   Influenza B by PCR NEGATIVE NEGATIVE Final    Comment: (NOTE) The Xpert Xpress SARS-CoV-2/FLU/RSV plus assay is intended as an aid in the diagnosis of influenza from Nasopharyngeal swab specimens and should not be used as a sole basis for treatment. Nasal washings and aspirates are unacceptable for Xpert Xpress SARS-CoV-2/FLU/RSV testing.  Fact Sheet for Patients: EntrepreneurPulse.com.au  Fact Sheet for Healthcare Providers: IncredibleEmployment.be  This test is not yet approved or cleared by the [place] FDA and has been authorized for detection and/or diagnosis of SARS-CoV-2 by FDA under an Emergency Use Authorization (EUA). This EUA will remain in effect (meaning this test can be used) for the duration of the COVID-19 declaration under Section 564(b)(1) of the Act, 21 U.S.C. section 360bbb-3(b)(1), unless the authorization is terminated or revoked.  Performed at [facility], [place] [address], [place], [place] [address]   Urine Culture     Status: Abnormal   Collection Time: 12/03/20  2:59 PM   Specimen: Urine, Clean Catch  Result Value Ref Range Status   Specimen Description   Final  URINE, CLEAN CATCH Performed at [facility], [place] [address] [place], [place] [address]    Special Requests   Final    NONE Performed at [facility], [place] [address] [place], [place] [address]    Culture (A)  Final    >=100,000 COLONIES/mL PROTEUS  VULGARIS >=100,000 COLONIES/mL ENTEROCOCCUS FAECALIS    Report Status 12/07/2020 FINAL  Final   Organism ID, Bacteria PROTEUS VULGARIS (A)  Final   Organism ID, Bacteria ENTEROCOCCUS FAECALIS (A)  Final      Susceptibility   Enterococcus faecalis - MIC*    AMPICILLIN <=2 SENSITIVE Sensitive     NITROFURANTOIN <=16 SENSITIVE Sensitive     VANCOMYCIN 1 SENSITIVE Sensitive     * >=100,000 COLONIES/mL ENTEROCOCCUS FAECALIS   Proteus vulgaris - MIC*    AMPICILLIN >=32 RESISTANT Resistant     CEFAZOLIN >=64 RESISTANT Resistant     CEFEPIME <=0.12 SENSITIVE Sensitive     CIPROFLOXACIN <=0.25 SENSITIVE Sensitive     GENTAMICIN <=1 SENSITIVE Sensitive     IMIPENEM 2 SENSITIVE Sensitive     NITROFURANTOIN 128 RESISTANT Resistant     TRIMETH/SULFA <=20 SENSITIVE Sensitive     AMPICILLIN/SULBACTAM 16 INTERMEDIATE Intermediate     PIP/TAZO <=4 SENSITIVE Sensitive     * >=100,000 COLONIES/mL PROTEUS VULGARIS  MRSA Next Gen by PCR, Nasal     Status: None   Collection Time: 12/04/20  4:06 PM   Specimen: Nasal Mucosa; Nasal Swab  Result Value Ref Range Status   MRSA by PCR Next Gen NOT DETECTED NOT DETECTED Final    Comment: (NOTE) The GeneXpert MRSA Assay (FDA approved for NASAL specimens only), is one component of a comprehensive MRSA colonization surveillance program. It is not intended to diagnose MRSA infection nor to guide or monitor treatment for MRSA infections. Test performance is not FDA approved in patients less than 29 years old. Performed at [facility], [place] [address], [place], [place] [address]   Culture, blood (routine x 2)     Status: None (Preliminary result)   Collection Time: 12/05/20  7:38 AM   Specimen: BLOOD RIGHT HAND  Result Value Ref Range Status   Specimen Description   Final    BLOOD RIGHT HAND Performed at [place] [address] [place], [place] [address]    Special Requests   Final    BOTTLES DRAWN AEROBIC ONLY  Blood Culture adequate volume Performed at [place] [address] [place], [place] [address]    Culture   Final    NO GROWTH 4 DAYS Performed at [facility], [place] [address], [place], [place] [address]    Report Status PENDING  Incomplete  Culture, blood (routine x 2)     Status: None (Preliminary result)   Collection Time: 12/05/20  7:38 AM   Specimen: BLOOD  Result Value Ref Range Status   Specimen Description   Final    BLOOD RIGHT ANTECUBITAL Performed at [place] [address] [place], [place] [address]    Special Requests   Final    BOTTLES DRAWN AEROBIC ONLY Blood Culture adequate volume Performed at [place] [address] [place], [place] [address]    Culture   Final    NO GROWTH 4 DAYS Performed at [facility], [place] [address], [place], [place] [address]    Report Status PENDING  Incomplete  Body fluid culture w  Gram Stain     Status: None   Collection Time: 12/05/20  3:27 PM   Specimen: PATH Cytology Pleural fluid  Result Value Ref Range Status   Specimen Description   Final    PLEURAL Performed at [place] [address] [place], [place] [address]    Special Requests   Final    NONE Performed at [facility], [place] [address] [place], [place] [address]    Gram Stain   Final    FEW SQUAMOUS EPITHELIAL CELLS PRESENT FEW WBC SEEN NO ORGANISMS SEEN    Culture   Final    NO GROWTH 3 DAYS Performed at [facility], [address], [place], [place] [address]    Report Status 12/09/2020 FINAL  Final      Radiology Studies: ECHOCARDIOGRAM COMPLETE  Result Date: 12/08/2020    ECHOCARDIOGRAM REPORT   Patient Name:   ARZU MCGAUGHEY Arenson Date of Exam: 12/08/2020 Medical Rec #:  [id number]              Height:       62.0 in Accession #:    [id number]             Weight:       126.8 lb Date of Birth:  01/13/1925               BSA:           1.575 m Patient Age:    39 years               BP:           97/57 mmHg Patient Gender: F                      HR:           120 bpm. Exam Location:  Inpatient Procedure: 2D Echo, Color Doppler and Cardiac Doppler Indications:    T51.76 Acute diastolic (congestive) heart failure  History:        Patient has prior history of Echocardiogram examinations, most                 recent 09/14/2020. CHF; COPD. Recent COVID Infection.  Sonographer:    Raquel Sarna Senior RDCS Referring Phys: [id number] Patrecia Pour  Sonographer Comments: Technically difficult due to COPD IMPRESSIONS  1. ? patient in rapid flutter during exam . Left ventricular ejection fraction, by estimation, is 60 to 65%. The left ventricle has normal function. The left ventricle has no regional wall motion abnormalities. Left ventricular diastolic parameters are indeterminate.  2. Right ventricular systolic function is normal. The right ventricular size is normal.  3. The mitral valve is normal in structure. No evidence of mitral valve regurgitation. No evidence of mitral stenosis.  4. The aortic valve is tricuspid. Aortic valve regurgitation is not visualized. Mild to moderate aortic valve sclerosis/calcification is present, without any evidence of aortic stenosis.  5. The inferior vena cava is normal in size with greater than 50% respiratory variability, suggesting right atrial pressure of 3 mmHg. FINDINGS  Left Ventricle: ? patient in rapid flutter during exam. Left ventricular ejection fraction, by estimation, is 60 to 65%. The left ventricle has normal function. The left ventricle has no regional wall motion abnormalities. The left ventricular internal cavity size was normal in size. There is no left ventricular hypertrophy. Left ventricular diastolic parameters are indeterminate. Right Ventricle: The right ventricular size is normal.  No increase in right ventricular wall thickness. Right ventricular systolic function is normal. Left Atrium: Left atrial size  was normal in size. Right Atrium: Right atrial size was normal in size. Pericardium: There is no evidence of pericardial effusion. Mitral Valve: The mitral valve is normal in structure. No evidence of mitral valve regurgitation. No evidence of mitral valve stenosis. Tricuspid Valve: The tricuspid valve is normal in structure. Tricuspid valve regurgitation is trivial. No evidence of tricuspid stenosis. Aortic Valve: The aortic valve is tricuspid. Aortic valve regurgitation is not visualized. Mild to moderate aortic valve sclerosis/calcification is present, without any evidence of aortic stenosis. Pulmonic Valve: The pulmonic valve was normal in structure. Pulmonic valve regurgitation is not visualized. No evidence of pulmonic stenosis. Aorta: The aortic root is normal in size and structure. Venous: The inferior vena cava is normal in size with greater than 50% respiratory variability, suggesting right atrial pressure of 3 mmHg. IAS/Shunts: The interatrial septum was not well visualized.  LEFT VENTRICLE PLAX 2D LVIDd:         2.90 cm LVIDs:         1.90 cm LV PW:         0.90 cm LV IVS:        1.00 cm LVOT diam:     1.80 cm LV SV:         34 LV SV Index:   22 LVOT Area:     2.54 cm  RIGHT VENTRICLE RV S prime:     7.83 cm/s TAPSE (M-mode): 1.0 cm LEFT ATRIUM           Index       RIGHT ATRIUM           Index LA diam:      3.20 cm 2.03 cm/m  RA Area:     10.90 cm LA Vol (A2C): 17.8 ml 11.30 ml/m RA Volume:   21.00 ml  13.33 ml/m LA Vol (A4C): 44.3 ml 28.13 ml/m  AORTIC VALVE LVOT Vmax:   69.80 cm/s LVOT Vmean:  56.700 cm/s LVOT VTI:    0.135 m  AORTA Ao Root diam: 3.20 cm  SHUNTS Systemic VTI:  0.14 m Systemic Diam: 1.80 cm Jenkins Rouge MD Electronically signed by Jenkins Rouge MD Signature Date/Time: 12/08/2020/1:13:34 PM    Final     Scheduled Meds:  albuterol  2 puff Inhalation BID   Chlorhexidine Gluconate Cloth  6 each Topical Q0600   digoxin  0.125 mg Oral Daily   enoxaparin (LOVENOX) injection  1 mg/kg  Subcutaneous Q12H   feeding supplement  237 mL Oral BID BM   flecainide  75 mg Oral BID   fluticasone furoate-vilanterol  1 puff Inhalation Daily   And   umeclidinium bromide  1 puff Inhalation Daily   metoprolol tartrate  12.5 mg Oral BID   mirtazapine  7.5 mg Oral QHS   multivitamin with minerals  1 tablet Oral Daily   sodium chloride flush  3 mL Intravenous Q12H   Continuous Infusions:  sodium chloride     ceFEPime (MAXIPIME) IV 2 g (12/09/20 2205)   vancomycin 500 mg (12/09/20 1224)     LOS: 7 days   Time spent: 35 minutes.  Little Ishikawa, DO  [facility] Pager: Secure chat After 7P - contact on [email or web address] 12/10/2020, 7:33 AM

## 2020-12-10 NOTE — Progress Notes (Addendum)
Pharmacy Antibiotic Note  Samantha Fitzgerald is a 87 y.o. female admitted on 12/03/2020 with sepsis. Patient is currently on antibiotics for a UTI as well as concern for bacteremia and cellulitis. Urine culture growing proteus vulgaris and enterococcus faecalis; BCID showing MRSE.   Pharmacy has been consulted for vancomycin and cefepime dosing.  Today, 12/10/20 WBC remains slightly elevated but is trending down overall SCr stable; CrCl ~34 mL/min Afebrile  This is day #6 of vancomycin and cefepime.   Plan: Continue cefepime 2 g IV q12h Continue vancomycin 500 mg IV q24h for estimated AUC of 437 Goal vancomycin AUC 400-550.  Monitor renal function, culture data. Repeat blood cultures drawn on 9/21 showing ngtd F/U plans for antibiotics   Height: 5\' 2"  (157.5 cm) Weight: 59.5 kg (131 lb 2.8 oz) IBW/kg (Calculated) : 50.1  Temp (24hrs), Avg:95.4 F (35.2 C), Min:84 F (28.9 C), Max:98.9 F (37.2 C)  Recent Labs  Lab 12/03/20 1058 12/03/20 1327 12/04/20 0420 12/06/20 0355 12/07/20 0341 12/08/20 0354 12/09/20 0318 12/10/20 0326  WBC 24.2*  --    < > 12.7* 13.1* 15.3* 13.1* 14.7*  CREATININE 1.00  --    < > 0.64 0.60 0.67 0.69 0.68  LATICACIDVEN 2.2* 2.0*  --   --   --   --   --   --    < > = values in this interval not displayed.     Estimated Creatinine Clearance: 34 mL/min (by C-G formula based on SCr of 0.68 mg/dL).    Allergies  Allergen Reactions   Amoxicillin Other (See Comments)    Listed on MAR   Doxycycline Other (See Comments)    Listed on MAR   Levofloxacin Other (See Comments)    Listed on MAR   Morphine And Related Other (See Comments)    Listed on MAR    Antimicrobials this admission: 9/19 Vancomycin x 1, resumed 9/21 >> 9/19 Cefepime x 1, resumed 9/21 >> 9/19 Metronidazole x 1 9/20 Ceftriaxone >> 9/21  Dose adjustments this admission: 9/24 vancomycin 750 mg IV q24h >> 500 mg IV q24h  Microbiology results: 9/19 BCx: aerobic bottle of  both sets with GPC in clusters. BCID = Staph epi (mecA/C detected) 9/19 UCx: > 100K Proteus vulgaris, >100K enterococcus faecalis 9/20 MRSA PCR: negative  9/21 repeat BCx: NGTD  Thank you for allowing pharmacy to be a part of this patient's care.  Napoleon Form, PharmD 12/10/20 8:33 AM

## 2020-12-10 NOTE — Progress Notes (Signed)
OT Cancellation Note  Patient Details Name: Samantha Fitzgerald MRN: [id number] DOB: February 14, 1925   Cancelled Treatment:     Patient was in deep sleep at this time. Nurse reported having had challenging PT session earlier. Will check back as able and continue to follow.   Jackelyn Poling OTR/L, [place] [facility] Office# [phone] Pager# [phone]  12/10/2020, 3:34 PM

## 2020-12-11 DIAGNOSIS — R54 Age-related physical debility: Secondary | ICD-10-CM

## 2020-12-11 DIAGNOSIS — N390 Urinary tract infection, site not specified: Secondary | ICD-10-CM | POA: Diagnosis not present

## 2020-12-11 DIAGNOSIS — Z7189 Other specified counseling: Secondary | ICD-10-CM | POA: Diagnosis not present

## 2020-12-11 DIAGNOSIS — I5032 Chronic diastolic (congestive) heart failure: Secondary | ICD-10-CM | POA: Diagnosis not present

## 2020-12-11 LAB — CBC
HCT: 32 % — ABNORMAL LOW (ref 36.0–46.0)
Hemoglobin: 10.3 g/dL — ABNORMAL LOW (ref 12.0–15.0)
MCH: 29.1 pg (ref 26.0–34.0)
MCHC: 32.2 g/dL (ref 30.0–36.0)
MCV: 90.4 fL (ref 80.0–100.0)
Platelets: 336 10*3/uL (ref 150–400)
RBC: 3.54 MIL/uL — ABNORMAL LOW (ref 3.87–5.11)
RDW: 14 % (ref 11.5–15.5)
WBC: 12.6 10*3/uL — ABNORMAL HIGH (ref 4.0–10.5)
nRBC: 0 % (ref 0.0–0.2)

## 2020-12-11 LAB — BASIC METABOLIC PANEL
Anion gap: 7 (ref 5–15)
BUN: 25 mg/dL — ABNORMAL HIGH (ref 8–23)
CO2: 33 mmol/L — ABNORMAL HIGH (ref 22–32)
Calcium: 8.3 mg/dL — ABNORMAL LOW (ref 8.9–10.3)
Chloride: 97 mmol/L — ABNORMAL LOW (ref 98–111)
Creatinine, Ser: 0.62 mg/dL (ref 0.44–1.00)
GFR, Estimated: >60 mL/min (ref >60)
Glucose, Bld: 90 mg/dL (ref 70–99)
Potassium: 4.3 mmol/L (ref 3.5–5.1)
Sodium: 137 mmol/L (ref 135–145)

## 2020-12-11 NOTE — Consult Note (Signed)
WOC Nurse Consult Note: Patient receiving care in [place]. Reason for Consult: pressure injury remains in flowsheet section of EMR from July 2022. Wanted re-eval of the area Wound type: NO PI found to sacrum/coccyx Patient has on a prophylactic sacral foam dressing.  Patient prefers having the [facility] very elevated.  This type of high elevation of the HOB can contribute to PI to bony prominences, fortunately, today I did not find any PIs. WOC nurse will not follow at this time.  Please re-consult the [place] team if needed.  Val Riles, RN, MSN, CWOCN, CNS-BC, pager [phone]

## 2020-12-11 NOTE — Progress Notes (Signed)
Daily Progress Note   Patient Name: Samantha Fitzgerald       Date: 12/11/2020 DOB: 1924/06/13  Age: 87 y.o. MRN#: [id number] Attending Physician: Little Ishikawa, MD Primary Care Physician: System, Provider Not In Admit Date: 12/03/2020  Reason for Consultation/Follow-up: Establishing goals of care  Patient Profile/HPI:   87 y.o. female  with past medical history of COPD, grade 1 diastolic heart failure, SSS,  recurrent pleural effusions, Covid infection in June of this year admitted on 12/03/2020 with urosepsis, Covid positivity (low CT not likely to be an active infection), bilateral pleural effusions (presumed d/t hypoalbuminemia), lower extremity DVT.  Palliative consulted for [place]. 9/22- Blood cultures have also now returned growing Staph epidermis- she has been started on vancomycin.   Subjective: Samantha Fitzgerald is much less responsive than when I saw her on Thursday. She is not able to answer any of my questions.  She is not eating or drinking.  Her oxygen requirements increased with sitting on EOB with PT yesterday.  I met with Samantha Fitzgerald outside the room.  I expressed my concerns that Samantha Fitzgerald is dying. We discussed the difficulty in recovering from her multiple medical problems and her poor reserve that she had prior to this insult.   Samantha Fitzgerald notes that Samantha Fitzgerald has also expressed this concern- she asked Samantha Fitzgerald and the cardiologist yesterday if she was dying.  I discussed with Samantha Fitzgerald what goals would need to be met if her Mom was dying- Samantha Fitzgerald notes that her sister needs to come into town. Samantha Fitzgerald is going to call her and have her come ASAP.  I discussed option for ongoing aggressive interventions- IV antibiotics, IV fluids (what little she can tolerate in the setting of CHF and pleural effusions),  labs versus transition to full comfort measures only- dc'ing those interventions not contributing to comfort and possibly hospice placement.  We discussed quality of life that is important to [name]. Also discussed maintaining dignity at end of life.    Review of Systems  Unable to perform ROS: Mental status change    Physical Exam Vitals and nursing note reviewed.  Constitutional:      Appearance: She is ill-appearing.  Pulmonary:     Effort: Pulmonary effort is normal.  Neurological:     Mental Status: She is alert.     Comments: Unintelligible mouthing  of words, no phonation, not following commands            Vital Signs: BP (!) 147/56 (BP Location: Right Arm)   Pulse 69   Temp 97.6 F (36.4 C) (Oral)   Resp (!) 22   Ht '5\' 2"'  (1.575 m)   Wt 59.5 kg   SpO2 95%   BMI 23.99 kg/m  SpO2: SpO2: 95 % O2 Device: O2 Device: High Flow Nasal Cannula O2 Flow Rate: O2 Flow Rate (L/min): 4 L/min  Intake/output summary:  Intake/Output Summary (Last 24 hours) at 12/11/2020 0949 Last data filed at 12/11/2020 0845 Gross per 24 hour  Intake 90 ml  Output 845 ml  Net -755 ml   LBM: Last BM Date: 12/10/20 Baseline Weight: Weight: 59 kg Most recent weight: Weight: 59.5 kg       Palliative Assessment/Data: PPS: 20%      Patient Active Problem List   Diagnosis Date Noted   Sepsis ([place]) 12/03/2020   COVID-19 virus infection 12/03/2020   Acute lower UTI 12/03/2020   Cellulitis 12/03/2020   Chronic diastolic CHF (congestive heart failure) ([place]) 12/03/2020   Hypoalbuminemia 12/03/2020   AKI (acute kidney injury) ([place]) 12/03/2020   Pleural effusion on left 09/12/2020   COPD (chronic obstructive pulmonary disease) ([place]) 09/12/2020   Sick sinus syndrome ([place]) 09/12/2020   Hypoxia 09/12/2020    Palliative Care Assessment & Plan    Assessment/Recommendations/Plan  DNR Continue current plan PMT will continue to follow- Samantha Fitzgerald has PMT contact number- she is considering  options   Code Status: DNR  Prognosis:  Unable to determine although I suspect less than 2 weeks regardless of path of care  Discharge Planning: To Be Determined  Care plan was discussed with patient's daughter and care team.  Thank you for allowing the Palliative Medicine Team to assist in the care of this patient.  Total time:  Prolonged billing:      Greater than 50%  of this time was spent counseling and coordinating care related to the above assessment and plan.  Samantha Fitzgerald, AGNP-C Palliative Medicine   Please contact Palliative Medicine Team phone at [phone] for questions and concerns.

## 2020-12-11 NOTE — Progress Notes (Signed)
PROGRESS NOTE  Samantha Fitzgerald  XQJ:[id number] DOB: 10-30-1924 DOA: 12/03/2020 PCP: System, Provider Not In   Brief Narrative: Samantha Fitzgerald is a 87 y.o. female with medical history significant of COPD 2 L nasal cannula, sick sinus syndrome and SVT, Grade 1 diastolic HF, with recurrent large left-sided pleural effusion, recently diagnosed with covid June/July 2022.  Patient presents to the ED from [facility] facility with hypotension general weakness with reportedly acute onset blood pressure noted to be as low as 80/60 with bilateral lower extremity edema and worsening hypoxic respiratory failure. She was found to have severe sepsis with Proteus UTI, LLE cellulitis, and volume overload with pleural effusion. Thoracentesis was performed and diuresis begun with improvement in respiratory status. On 9/24, the patient developed SVT terminated with metoprolol IV.  Assessment & Plan:  Severe sepsis secondary to polymicrobial UTI, POA, ongoing:  MDR Staph epidermidis bacteremia suspected Rule out concurrent cellulitis of LLE -Minimally improving over the past 48 hours, still profoundly weak, continues to have mental status changes compared to baseline - Proteus with resistance noted on cultures, continue cefepime.  - Resistant Enterococcus covered by vancomycin as below  - Cefepime and vancomycin being given. Vanc added for GPC's discussed below. - 2 aerobic bottles out of 4 total bottles collected on 9/19 from right AC, left forearm.  Repeat cultures preliminary negative - Continue vancomycin. With ongoing leukocytosis, no vegetations on echo. - Repeat blood cultures 9/21 remain negative  Acute metabolic encephalopathy, improving -Likely multifactorial in setting of infection, sundowning/hospital delirium, polypharmacy. -Improving somewhat over the past 24 hours, more awake alert oriented this morning without overt deficits  Acute on chronic hypoxic respiratory failure, improving:   - Likely related to symptomatic pleural effusions (R > L) - Left Thoracentesis 12/05/2020 with 800cc red-tinged w/protein <3, cyto pending, with negative gram stain. Thora last done 6/30 with 850cc removed. - Continue to titrate oxygen down to baseline 2 L nasal cannula    Acute on chronic diastolic CHF: - IVF discontinued - Continue to hold diuretics in the setting of poor p.o. intake, monitor for dehydration -we will likely resume in the next few days pending p.o. tolerance and volume status.     Acute DVT of the right common femoral vein and SF junction:  - Continue lovenox 1mg /kg q12h -further discussion with family and patient about risks of full dose anticoagulation in the setting of a patient with high fall risk and ambulatory dysfunction - Benefits of anticoagulation in the hospital and at SNF while monitored certainly outweigh the risks, given patient's independent living situation however this may need to be readdressed after rehab if she is a high fall risk  SVT, transient, resolved:  - Converted to NSR after recurrent digoxin dose - continue digoxin (level 1.4) and flecainide.  - Metoprolol was held due to hypotension initially, continue low dose - Cardiology following - appreciate insight/recs   Abnormal spastic/choreiform movement, transient  -Without focal deficits but patient has occasional episode of "twitching" and "spasm" of arms and legs on occasion with intention somewhat limiting physical therapy -Discontinue Remeron, unclear if secondary to polypharmacy, recent infection or some chronic underlying issue that has surfaced in the setting of profound sepsis  COPD: Not in acute exacerbation.  - Continue inhaled therapies.    History of covid-19 infection:  -Previously treated with remdesivir and steroids.  -No pulmonary symptoms, infiltrate on CXR. CT not consistent with active infection.  - No isolation/Tx required.    Hypokalemia/Hypomagnesemia:  - Continue  monitoring.   DVT prophylaxis: Lovenox treatment dose - 1mg /kg q12h   Code Status: DNR Family Communication: Daughter at bedside Disposition Plan:  Status is: Inpatient  Remains inpatient appropriate because:Ongoing diagnostic testing needed not appropriate for outpatient work up and Inpatient level of care appropriate due to severity of illness  Dispo: The patient is from: ALF              Anticipated d/c is to: ALF vs. SNF depending on mobility improvements during admission.              Patient currently is not medically stable to d/c.   Consultants:  Cardiology, IR  Procedures:  U/S-guided thoracentesis on left 12/05/2020 by Dr. Serafina Royals.  Antimicrobials: Vancomycin 9/19, then 9/21 >> completed 12/11/2020 Ceftriaxone 9/19 - 9/20 Cefepime 9/19, then 9/21 >> completed 12/11/2020  Subjective: No acute issues or events overnight, remains pleasantly confused at bedside, much more awake and alert today than previous, oriented to person and place as well as family at bedside but cannot recall her birthday which is abnormal for her baseline.  Review of systems somewhat limited but denies chest pain shortness of breath headache fevers or chills  Objective: Vitals:   12/10/20 0437 12/10/20 1648 12/10/20 2039 12/11/20 0525  BP: (!) 156/71 (!) 170/76 117/67 129/80  Pulse: 73 86 82 67  Resp: 20 20 (!) 22 (!) 24  Temp: 98 F (36.7 C) 98.8 F (37.1 C) 97.8 F (36.6 C) 97.9 F (36.6 C)  TempSrc: Axillary  Oral Oral  SpO2: 99% 97% 100% 97%  Weight:      Height:        Intake/Output Summary (Last 24 hours) at 12/11/2020 0754 Last data filed at 12/11/2020 0530 Gross per 24 hour  Intake 90 ml  Output 600 ml  Net -510 ml    Filed Weights   12/08/20 0500 12/09/20 0425 12/10/20 0433  Weight: 57.5 kg 60.3 kg 59.5 kg   General: Pleasantly resting in bed, No acute distress.  Alert to person/family only HEENT:  Normocephalic atraumatic.  Sclerae nonicteric, noninjected.  Extraocular  movements intact bilaterally. Neck:  Without mass or deformity.  Trachea is midline. Lungs:  Clear to auscultate bilaterally without rhonchi, wheeze, or rales. Heart:  Regular rate and rhythm.  Without murmurs, rubs, or gallops. Abdomen:  Soft, nontender, nondistended.  Without guarding or rebound. Extremities: Without cyanosis, clubbing, edema -spastic upper extremity motion, both at rest and with intention without focal deficit Vascular:  Dorsalis pedis and posterior tibial pulses palpable bilaterally. Skin: Bandages clean dry intact  Data Reviewed: I have personally reviewed following labs and imaging studies  CBC: Recent Labs  Lab 12/05/20 0335 12/06/20 0355 12/07/20 0341 12/08/20 0354 12/09/20 0318 12/10/20 0326 12/11/20 0416  WBC 15.8* 12.7* 13.1* 15.3* 13.1* 14.7* 12.6*  NEUTROABS 13.3* 10.7* 10.3* 12.3* 10.1*  --   --   HGB 11.2* 10.6* 10.8* 11.6* 11.2* 11.4* 10.3*  HCT 34.4* 32.2* 33.0* 36.4 35.8* 35.5* 32.0*  MCV 88.2 87.7 88.7 89.7 90.6 90.3 90.4  PLT 342 327 310 341 310 341 761    Basic Metabolic Panel: Recent Labs  Lab 12/07/20 0341 12/08/20 0354 12/09/20 0318 12/10/20 0326 12/11/20 0416  NA 136 135 138 134* 137  K 3.7 4.4 4.6 4.6 4.3  CL 94* 90* 90* 91* 97*  CO2 31 37* 39* 35* 33*  GLUCOSE 99 143* 99 93 90  BUN 18 24* 29* 25* 25*  CREATININE 0.60 0.67 0.69 0.68 0.62  CALCIUM  8.1* 8.7* 8.9 8.4* 8.3*  MG  --   --  2.2  --   --     GFR: Estimated Creatinine Clearance: 34 mL/min (by C-G formula based on SCr of 0.62 mg/dL). Liver Function Tests: Recent Labs  Lab 12/05/20 0335 12/06/20 0355 12/07/20 0341 12/08/20 0354  AST 15 14* 18 28  ALT 11 10 12 17   ALKPHOS 83 77 76 80  BILITOT 0.9 0.5 0.4 0.6  PROT 5.3* 5.3* 5.1* 5.6*  ALBUMIN 2.6* 2.5* 2.4* 2.4*    No results for input(s): LIPASE, AMYLASE in the last 168 hours. No results for input(s): AMMONIA in the last 168 hours. Coagulation Profile: No results for input(s): INR, PROTIME in the last  168 hours.  Cardiac Enzymes: No results for input(s): CKTOTAL, CKMB, CKMBINDEX, TROPONINI in the last 168 hours.  BNP (last 3 results) No results for input(s): PROBNP in the last 8760 hours. HbA1C: No results for input(s): HGBA1C in the last 72 hours. CBG: No results for input(s): GLUCAP in the last 168 hours. Lipid Profile: No results for input(s): CHOL, HDL, LDLCALC, TRIG, CHOLHDL, LDLDIRECT in the last 72 hours. Thyroid Function Tests: No results for input(s): TSH, T4TOTAL, FREET4, T3FREE, THYROIDAB in the last 72 hours.  Anemia Panel: No results for input(s): VITAMINB12, FOLATE, FERRITIN, TIBC, IRON, RETICCTPCT in the last 72 hours.  Urine analysis:    Component Value Date/Time   COLORURINE STRAW (A) 12/03/2020 1458   APPEARANCEUR TURBID (A) 12/03/2020 1458   LABSPEC  12/03/2020 1458    TEST NOT REPORTED DUE TO COLOR INTERFERENCE OF URINE PIGMENT   PHURINE  12/03/2020 1458    TEST NOT REPORTED DUE TO COLOR INTERFERENCE OF URINE PIGMENT   GLUCOSEU (A) 12/03/2020 1458    TEST NOT REPORTED DUE TO COLOR INTERFERENCE OF URINE PIGMENT   HGBUR (A) 12/03/2020 1458    TEST NOT REPORTED DUE TO COLOR INTERFERENCE OF URINE PIGMENT   BILIRUBINUR (A) 12/03/2020 1458    TEST NOT REPORTED DUE TO COLOR INTERFERENCE OF URINE PIGMENT   KETONESUR (A) 12/03/2020 1458    TEST NOT REPORTED DUE TO COLOR INTERFERENCE OF URINE PIGMENT   PROTEINUR (A) 12/03/2020 1458    TEST NOT REPORTED DUE TO COLOR INTERFERENCE OF URINE PIGMENT   NITRITE (A) 12/03/2020 1458    TEST NOT REPORTED DUE TO COLOR INTERFERENCE OF URINE PIGMENT   LEUKOCYTESUR (A) 12/03/2020 1458    TEST NOT REPORTED DUE TO COLOR INTERFERENCE OF URINE PIGMENT   Recent Results (from the past 240 hour(s))  Culture, blood (Routine x 2)     Status: Abnormal   Collection Time: 12/03/20 10:54 AM   Specimen: BLOOD LEFT FOREARM  Result Value Ref Range Status   Specimen Description   Final    BLOOD LEFT FOREARM Performed at [facility], [place] [address], [place], [place] [address]    Special Requests   Final    BOTTLES DRAWN AEROBIC AND ANAEROBIC Blood Culture adequate volume Performed at [place] [address] [place], [place] [address]    Culture  Setup Time   Final    GRAM POSITIVE COCCI IN CLUSTERS AEROBIC BOTTLE ONLY Organism ID to follow CRITICAL RESULT CALLED TO, READ BACK BY AND VERIFIED WITH: A TUCKER PHARMD 1652 12/04/20 A BROWNING    Culture (A)  Final    STAPHYLOCOCCUS EPIDERMIDIS SUSCEPTIBILITIES PERFORMED ON PREVIOUS CULTURE WITHIN THE LAST 5 DAYS. Performed at [facility], [place] [address],  [place], [place] [address]    Report Status 12/07/2020 FINAL  Final  Blood Culture ID Panel (Reflexed)     Status: Abnormal   Collection Time: 12/03/20 10:54 AM  Result Value Ref Range Status   Enterococcus faecalis NOT DETECTED NOT DETECTED Final   Enterococcus Faecium NOT DETECTED NOT DETECTED Final   Listeria monocytogenes NOT DETECTED NOT DETECTED Final   Staphylococcus species DETECTED (A) NOT DETECTED Final    Comment: CRITICAL RESULT CALLED TO, READ BACK BY AND VERIFIED WITH: A TUCKER PHARMD 1652 12/04/20 A BROWNING    Staphylococcus aureus (BCID) NOT DETECTED NOT DETECTED Final   Staphylococcus epidermidis DETECTED (A) NOT DETECTED Final    Comment: Methicillin (oxacillin) resistant coagulase negative staphylococcus. Possible blood culture contaminant (unless isolated from more than one blood culture draw or clinical case suggests pathogenicity). No antibiotic treatment is indicated for blood  culture contaminants. CRITICAL RESULT CALLED TO, READ BACK BY AND VERIFIED WITH: A TUCKER PHARMD 1652 12/04/20 A BROWNING    Staphylococcus lugdunensis NOT DETECTED NOT DETECTED Final   Streptococcus species NOT DETECTED NOT DETECTED Final   Streptococcus agalactiae NOT DETECTED NOT DETECTED Final   Streptococcus pneumoniae NOT DETECTED NOT DETECTED Final    Streptococcus pyogenes NOT DETECTED NOT DETECTED Final   A.calcoaceticus-baumannii NOT DETECTED NOT DETECTED Final   Bacteroides fragilis NOT DETECTED NOT DETECTED Final   Enterobacterales NOT DETECTED NOT DETECTED Final   Enterobacter cloacae complex NOT DETECTED NOT DETECTED Final   Escherichia coli NOT DETECTED NOT DETECTED Final   Klebsiella aerogenes NOT DETECTED NOT DETECTED Final   Klebsiella oxytoca NOT DETECTED NOT DETECTED Final   Klebsiella pneumoniae NOT DETECTED NOT DETECTED Final   Proteus species NOT DETECTED NOT DETECTED Final   Salmonella species NOT DETECTED NOT DETECTED Final   Serratia marcescens NOT DETECTED NOT DETECTED Final   Haemophilus influenzae NOT DETECTED NOT DETECTED Final   Neisseria meningitidis NOT DETECTED NOT DETECTED Final   Pseudomonas aeruginosa NOT DETECTED NOT DETECTED Final   Stenotrophomonas maltophilia NOT DETECTED NOT DETECTED Final   Candida albicans NOT DETECTED NOT DETECTED Final   Candida auris NOT DETECTED NOT DETECTED Final   Candida glabrata NOT DETECTED NOT DETECTED Final   Candida krusei NOT DETECTED NOT DETECTED Final   Candida parapsilosis NOT DETECTED NOT DETECTED Final   Candida tropicalis NOT DETECTED NOT DETECTED Final   Cryptococcus neoformans/gattii NOT DETECTED NOT DETECTED Final   Methicillin resistance mecA/C DETECTED (A) NOT DETECTED Final    Comment: CRITICAL RESULT CALLED TO, READ BACK BY AND VERIFIED WITHAustin Miles PHARMD 1652 12/04/20 A BROWNING Performed at [facility], [address], [place], [place] [address]   Culture, blood (Routine x 2)     Status: Abnormal   Collection Time: 12/03/20 10:59 AM   Specimen: BLOOD  Result Value Ref Range Status   Specimen Description   Final    BLOOD RIGHT ANTECUBITAL Performed at [facility] [address], [place], [place] [address]    Special Requests   Final    BOTTLES DRAWN AEROBIC AND ANAEROBIC Blood Culture adequate volume Performed at  [facility] [place] [address] [place], [place] [address]    Culture  Setup Time   Final    GRAM POSITIVE COCCI IN CLUSTERS AEROBIC BOTTLE ONLY CRITICAL VALUE NOTED.  VALUE IS CONSISTENT WITH PREVIOUSLY REPORTED AND CALLED VALUE. Performed at [facility], [place] [address] [place], [place] [address]    Culture STAPHYLOCOCCUS EPIDERMIDIS (  A)  Final   Report Status 12/07/2020 FINAL  Final   Organism ID, Bacteria STAPHYLOCOCCUS EPIDERMIDIS  Final      Susceptibility   Staphylococcus epidermidis - MIC*    CIPROFLOXACIN >=8 RESISTANT Resistant     ERYTHROMYCIN >=8 RESISTANT Resistant     GENTAMICIN >=16 RESISTANT Resistant     OXACILLIN >=4 RESISTANT Resistant     TETRACYCLINE 2 SENSITIVE Sensitive     VANCOMYCIN 4 SENSITIVE Sensitive     TRIMETH/SULFA 80 RESISTANT Resistant     CLINDAMYCIN >=8 RESISTANT Resistant     RIFAMPIN <=0.5 SENSITIVE Sensitive     Inducible Clindamycin NEGATIVE Sensitive     * STAPHYLOCOCCUS EPIDERMIDIS  Resp Panel by RT-PCR (Flu A&B, Covid) Nasopharyngeal Swab     Status: Abnormal   Collection Time: 12/03/20 12:03 PM   Specimen: Nasopharyngeal Swab; Nasopharyngeal(NP) swabs in vial transport medium  Result Value Ref Range Status   SARS Coronavirus 2 by RT PCR POSITIVE (A) NEGATIVE Final    Comment: RESULT CALLED TO, READ BACK BY AND VERIFIED WITH: JACOBS,D. RN @1310  ON 12/03/2020 BY COHEN,K (NOTE) SARS-CoV-2 target nucleic acids are DETECTED.  The SARS-CoV-2 RNA is generally detectable in upper respiratory specimens during the acute phase of infection. Positive results are indicative of the presence of the identified virus, but do not rule out bacterial infection or co-infection with other pathogens not detected by the test. Clinical correlation with patient history and other diagnostic information is necessary to determine patient infection status. The expected result is Negative.  Fact Sheet for  Patients: EntrepreneurPulse.com.au  Fact Sheet for Healthcare Providers: IncredibleEmployment.be  This test is not yet approved or cleared by the [place] FDA and  has been authorized for detection and/or diagnosis of SARS-CoV-2 by FDA under an Emergency Use Authorization (EUA).  This EUA will remain in effect (meaning this t est can be used) for the duration of  the COVID-19 declaration under Section 564(b)(1) of the Act, 21 U.S.C. section 360bbb-3(b)(1), unless the authorization is terminated or revoked sooner.     Influenza A by PCR NEGATIVE NEGATIVE Final   Influenza B by PCR NEGATIVE NEGATIVE Final    Comment: (NOTE) The Xpert Xpress SARS-CoV-2/FLU/RSV plus assay is intended as an aid in the diagnosis of influenza from Nasopharyngeal swab specimens and should not be used as a sole basis for treatment. Nasal washings and aspirates are unacceptable for Xpert Xpress SARS-CoV-2/FLU/RSV testing.  Fact Sheet for Patients: EntrepreneurPulse.com.au  Fact Sheet for Healthcare Providers: IncredibleEmployment.be  This test is not yet approved or cleared by the [place] FDA and has been authorized for detection and/or diagnosis of SARS-CoV-2 by FDA under an Emergency Use Authorization (EUA). This EUA will remain in effect (meaning this test can be used) for the duration of the COVID-19 declaration under Section 564(b)(1) of the Act, 21 U.S.C. section 360bbb-3(b)(1), unless the authorization is terminated or revoked.  Performed at [facility], [place] [address], [place], [place] [address]   Urine Culture     Status: Abnormal   Collection Time: 12/03/20  2:59 PM   Specimen: Urine, Clean Catch  Result Value Ref Range Status   Specimen Description   Final    URINE, CLEAN CATCH Performed at [facility], [place] [address] [place], [place] [address]    Special Requests    Final    NONE Performed at [facility], [place] [address] [place], [place] [address]    Culture (A)  Final    >=100,000 COLONIES/mL PROTEUS VULGARIS >=100,000 COLONIES/mL ENTEROCOCCUS FAECALIS    Report Status 12/07/2020 FINAL  Final   Organism ID, Bacteria PROTEUS VULGARIS (A)  Final   Organism ID, Bacteria ENTEROCOCCUS FAECALIS (A)  Final      Susceptibility   Enterococcus faecalis - MIC*    AMPICILLIN <=2 SENSITIVE Sensitive     NITROFURANTOIN <=16 SENSITIVE Sensitive     VANCOMYCIN 1 SENSITIVE Sensitive     * >=100,000 COLONIES/mL ENTEROCOCCUS FAECALIS   Proteus vulgaris - MIC*    AMPICILLIN >=32 RESISTANT Resistant     CEFAZOLIN >=64 RESISTANT Resistant     CEFEPIME <=0.12 SENSITIVE Sensitive     CIPROFLOXACIN <=0.25 SENSITIVE Sensitive     GENTAMICIN <=1 SENSITIVE Sensitive     IMIPENEM 2 SENSITIVE Sensitive     NITROFURANTOIN 128 RESISTANT Resistant     TRIMETH/SULFA <=20 SENSITIVE Sensitive     AMPICILLIN/SULBACTAM 16 INTERMEDIATE Intermediate     PIP/TAZO <=4 SENSITIVE Sensitive     * >=100,000 COLONIES/mL PROTEUS VULGARIS  MRSA Next Gen by PCR, Nasal     Status: None   Collection Time: 12/04/20  4:06 PM   Specimen: Nasal Mucosa; Nasal Swab  Result Value Ref Range Status   MRSA by PCR Next Gen NOT DETECTED NOT DETECTED Final    Comment: (NOTE) The GeneXpert MRSA Assay (FDA approved for NASAL specimens only), is one component of a comprehensive MRSA colonization surveillance program. It is not intended to diagnose MRSA infection nor to guide or monitor treatment for MRSA infections. Test performance is not FDA approved in patients less than 45 years old. Performed at [facility], [place] [address] [place], [place] [address]   Culture, blood (routine x 2)     Status: None   Collection Time: 12/05/20  7:38 AM   Specimen: BLOOD RIGHT HAND  Result Value Ref Range Status   Specimen Description   Final    BLOOD RIGHT  HAND Performed at [facility] [address], [place], [place] [address]    Special Requests   Final    BOTTLES DRAWN AEROBIC ONLY Blood Culture adequate volume Performed at [facility] [address], [place], [place] [address]    Culture   Final    NO GROWTH 5 DAYS Performed at [facility], [place] [address] [place], [place] [address]    Report Status 12/10/2020 FINAL  Final  Culture, blood (routine x 2)     Status: None   Collection Time: 12/05/20  7:38 AM   Specimen: BLOOD  Result Value Ref Range Status   Specimen Description   Final    BLOOD RIGHT ANTECUBITAL Performed at [facility] [place] [address] [place], [place] [address]    Special Requests   Final    BOTTLES DRAWN AEROBIC ONLY Blood Culture adequate volume Performed at [facility] [facility] [address], [place], [place] [address]    Culture   Final    NO GROWTH 5 DAYS Performed at [facility], [place] [address], [place], [place] [address]    Report Status 12/10/2020 FINAL  Final  Body fluid culture w Gram Stain     Status: None   Collection Time: 12/05/20  3:27 PM   Specimen: PATH Cytology Pleural fluid  Result Value Ref Range Status   Specimen Description   Final    PLEURAL Performed at [facility] [place]  [place], [place] [address]    Special Requests   Final    NONE Performed at [facility], [place] [address] [place], [place] [address]    Gram Stain   Final    FEW SQUAMOUS EPITHELIAL CELLS PRESENT FEW WBC SEEN NO ORGANISMS SEEN    Culture   Final    NO GROWTH 3 DAYS Performed at [facility], [address] [place], [place] [address]    Report Status 12/09/2020 FINAL  Final      Radiology Studies: No results found.  Scheduled Meds:  albuterol  2 puff Inhalation BID   Chlorhexidine Gluconate Cloth  6 each Topical Q0600   digoxin  0.125 mg Oral Daily    enoxaparin (LOVENOX) injection  1 mg/kg Subcutaneous Q12H   feeding supplement  237 mL Oral BID BM   flecainide  75 mg Oral BID   fluticasone furoate-vilanterol  1 puff Inhalation Daily   And   umeclidinium bromide  1 puff Inhalation Daily   metoprolol tartrate  12.5 mg Oral BID   mirtazapine  7.5 mg Oral QHS   multivitamin with minerals  1 tablet Oral Daily   sodium chloride flush  3 mL Intravenous Q12H   Continuous Infusions:  sodium chloride     ceFEPime (MAXIPIME) IV 2 g (12/10/20 2134)   vancomycin 500 mg (12/10/20 1305)     LOS: 8 days   Time spent: 35 minutes.  Little Ishikawa, DO  [facility] Pager: Secure chat  After 7P - contact on [email or web address] 12/11/2020, 7:54 AM

## 2020-12-11 NOTE — Progress Notes (Signed)
Nutrition Follow-up RD working remotely.  DOCUMENTATION CODES:   Not applicable  INTERVENTION:  - monitor for decisions concerning GOC. - continue Ensure Plus BID. - liberalize diet from Heart Healthy to Regular for patient who Palliative Care indicates may be at or approaching EOL.    NUTRITION DIAGNOSIS:   Increased nutrient needs related to acute illness, catabolic illness (UYEBX-43 infection) as evidenced by estimated needs. -ongoing  GOAL:   Patient will meet greater than or equal to 90% of their needs -unmet  MONITOR:   PO intake, Supplement acceptance, Labs, Weight trends, I & O's   ASSESSMENT:   87 y.o. female with medical history of COPD (2L O2 at baseline), sick sinus syndrome, CHF, large L-sided pleural effusion, COVID-19 in 08/2020. She presented to the ED due to generalized weakness and increased BLE edema. Patient was unable to provide information/history in the ED. She underwent a thoracentesis on 09/13/20 with 850 ml removed. She received the flu shot 4 days PTA.  Recently documented meal intakes (the past 3 days) are 0-20%. She has been accepting Ensure nearly 100% of the time offered.   Weight has been fairly stable throughout hospitalization. Mild pitting edema to BLE documented in the edema section of flow sheet.  Palliative Care talked with her daughter earlier today. Note indicates PPS of 20% and that prognosis is likely <2 weeks. Pending formal decision from family concerning [facility] moving forward.    Labs reviewed; Cl: 97 mmol/l, BUN: 25 mg/dl, Ca: 8.3 mg/dl.  Medications reviewed; 1 tablet multivitamin with minerals/day.   Diet Order:   Diet Order             Diet Heart Room service appropriate? Yes; Fluid consistency: Thin  Diet effective now                   EDUCATION NEEDS:   Not appropriate for education at this time  Skin:  Skin Assessment: Reviewed RN Assessment  Last BM:  9/26 (type 6 x1)  Height:   Ht Readings from Last 1  Encounters:  12/04/20 5\' 2"  (1.575 m)    Weight:   Wt Readings from Last 1 Encounters:  12/10/20 59.5 kg     Estimated Nutritional Needs:  Kcal:  1500-1700 kcal Protein:  80-95 grams Fluid:  >/= 2 L/day     Jarome Matin, MS, RD, LDN, CNSC Inpatient Clinical Dietitian RD pager # available in AMION  After hours/weekend pager # available in [facility]

## 2020-12-11 NOTE — Progress Notes (Signed)
Occupational Therapy Treatment Patient Details Name: Samantha Fitzgerald MRN: [id number] DOB: 1924/08/22 Today's Date: 12/11/2020   History of present illness Patient is 87 y.o. female with PMH for COPD on 2L nasal cannula, sick sinus syndrome, CHF admitted for shortness of breath and dry cough.  Found to have large left-sided pleural effusion, hx of recent COVID-19 positive and Severe Sepsis secondary to UTI   OT comments  Patient was lying in bed with O2 saturation 100% on 4L/min and HR 73 bpm. Patient was awake and agreed to attempt to get into recliner for breakfast. When attempted sitting on edge of bed patient was noted to have uncontrolled "jerking" movement of trunk and BUE. Patient was returned to supine and nurse was consulted. MD messaged on secure chat. Patient reported it was not normal movements for her. Patient's discharge plan remains appropriate at this time. OT will continue to follow acutely.     Recommendations for follow up therapy are one component of a multi-disciplinary discharge planning process, led by the attending physician.  Recommendations may be updated based on patient status, additional functional criteria and insurance authorization.    Follow Up Recommendations  SNF    Equipment Recommendations  None recommended by OT    Recommendations for Other Services      Precautions / Restrictions Precautions Precautions: Fall Precaution Comments: currently on 6L HFNC (2L O2 baseline) Restrictions Weight Bearing Restrictions: No       Mobility Bed Mobility Overal bed mobility: Needs Assistance Bed Mobility: Supine to Sit     Supine to sit: Max assist;HOB elevated     General bed mobility comments: patient attempted sitting on edge of bed on this date with patient noted to have increased uncontrolled movement of trunk and BUE with sitting on edge of bed with patient immediately returned to supine. patient's movments stopped with return to supine. nurse  was consulted with patient reporting movements are not typical for her. nurse to consult MD. patient was repositioned in bed with max A x 2 with pillow positioning for pressure relief.    Transfers                      Balance                                           ADL either performed or assessed with clinical judgement   ADL Overall ADL's : Needs assistance/impaired                                             Vision       Perception     Praxis      Cognition                                                Exercises     Shoulder Instructions       General Comments      Pertinent Vitals/ Pain          Home Living  Prior Functioning/Environment              Frequency  Min 2X/week        Progress Toward Goals  OT Goals(current goals can now be found in the care plan section)        Plan Discharge plan remains appropriate    Co-evaluation                 AM-PAC OT "6 Clicks" Daily Activity     Outcome Measure   Help from another person eating meals?: A Lot Help from another person taking care of personal grooming?: A Lot Help from another person toileting, which includes using toliet, bedpan, or urinal?: Total Help from another person bathing (including washing, rinsing, drying)?: Total Help from another person to put on and taking off regular upper body clothing?: Total Help from another person to put on and taking off regular lower body clothing?: Total 6 Click Score: 8    End of Session    OT Visit Diagnosis: Unsteadiness on feet (R26.81);Muscle weakness (generalized) (M62.81)   Activity Tolerance Other (comment) (change in medical status)   Patient Left     Nurse Communication Other (comment) (patients increased uncontrolled movements with movement in trunk and BUE)        Time: 4536-4680 OT  Time Calculation (min): 13 min  Charges: OT General Charges $OT Visit: 1 Visit OT Treatments $Therapeutic Activity: 8-22 mins  Jackelyn Poling OTR/L, MS Acute Rehabilitation Department Office# [phone] Pager# [phone]   [place] 12/11/2020, 8:44 AM

## 2020-12-12 ENCOUNTER — Inpatient Hospital Stay (HOSPITAL_COMMUNITY): Payer: Medicare Other

## 2020-12-12 DIAGNOSIS — R531 Weakness: Secondary | ICD-10-CM | POA: Diagnosis not present

## 2020-12-12 DIAGNOSIS — Z7189 Other specified counseling: Secondary | ICD-10-CM | POA: Diagnosis not present

## 2020-12-12 DIAGNOSIS — R7881 Bacteremia: Secondary | ICD-10-CM | POA: Diagnosis not present

## 2020-12-12 DIAGNOSIS — Z9889 Other specified postprocedural states: Secondary | ICD-10-CM

## 2020-12-12 DIAGNOSIS — I5032 Chronic diastolic (congestive) heart failure: Secondary | ICD-10-CM | POA: Diagnosis not present

## 2020-12-12 DIAGNOSIS — Z515 Encounter for palliative care: Secondary | ICD-10-CM | POA: Diagnosis not present

## 2020-12-12 DIAGNOSIS — N390 Urinary tract infection, site not specified: Secondary | ICD-10-CM | POA: Diagnosis not present

## 2020-12-12 DIAGNOSIS — N179 Acute kidney failure, unspecified: Secondary | ICD-10-CM | POA: Diagnosis not present

## 2020-12-12 DIAGNOSIS — A419 Sepsis, unspecified organism: Secondary | ICD-10-CM

## 2020-12-12 DIAGNOSIS — J449 Chronic obstructive pulmonary disease, unspecified: Secondary | ICD-10-CM | POA: Diagnosis not present

## 2020-12-12 LAB — BASIC METABOLIC PANEL
Anion gap: 7 (ref 5–15)
BUN: 27 mg/dL — ABNORMAL HIGH (ref 8–23)
CO2: 32 mmol/L (ref 22–32)
Calcium: 8.6 mg/dL — ABNORMAL LOW (ref 8.9–10.3)
Chloride: 98 mmol/L (ref 98–111)
Creatinine, Ser: 0.64 mg/dL (ref 0.44–1.00)
GFR, Estimated: >60 mL/min (ref >60)
Glucose, Bld: 91 mg/dL (ref 70–99)
Potassium: 4.3 mmol/L (ref 3.5–5.1)
Sodium: 137 mmol/L (ref 135–145)

## 2020-12-12 LAB — MISC LABCORP TEST (SEND OUT): Labcorp test code: 9985

## 2020-12-12 LAB — CBC
HCT: 32.1 % — ABNORMAL LOW (ref 36.0–46.0)
Hemoglobin: 10.3 g/dL — ABNORMAL LOW (ref 12.0–15.0)
MCH: 29.2 pg (ref 26.0–34.0)
MCHC: 32.1 g/dL (ref 30.0–36.0)
MCV: 90.9 fL (ref 80.0–100.0)
Platelets: 313 10*3/uL (ref 150–400)
RBC: 3.53 MIL/uL — ABNORMAL LOW (ref 3.87–5.11)
RDW: 14.2 % (ref 11.5–15.5)
WBC: 14.1 10*3/uL — ABNORMAL HIGH (ref 4.0–10.5)
nRBC: 0 % (ref 0.0–0.2)

## 2020-12-12 NOTE — TOC Progression Note (Signed)
Transition of Care [facility]) - Progression Note    Patient Details  Name: Samantha Fitzgerald MRN: [id number] Date of Birth: 29-Apr-1924  [facility] CM/SW Contact  Purcell Mouton, RN Phone Number: 12/12/2020, 11:29 AM  Clinical Narrative:    Spoke with pt's daughter Samantha Fitzgerald at bedside states that shew would like [facility]. A call to [facility] revealed related to pt being total care, would like for her to go to Rehab before returning to ALF. Spoke back with daughter to explain this information from [place]. Daughter selected [facility] used skilled days, will be out of pocket. However, waiting for Palliative for the Goals of Care to consult. TOC will continue to follow.    Expected Discharge Plan: Assisted Living Barriers to Discharge: No Barriers Identified  Expected Discharge Plan and Services Expected Discharge Plan: Assisted Living       Living arrangements for the past 2 months: Assisted Living Facility                                       Social Determinants of Health (SDOH) Interventions    Readmission Risk Interventions No flowsheet data found.

## 2020-12-12 NOTE — Progress Notes (Signed)
Daily Progress Note   Patient Name: Samantha Fitzgerald       Date: 12/12/2020 DOB: 02/21/1925  Age: 87 y.o. MRN#: [id number] Attending Physician: Caren Griffins, MD Primary Care Physician: System, Provider Not In Admit Date: 12/03/2020  Reason for Consultation/Follow-up: Establishing goals of care  Patient Profile/HPI:   87 y.o. female  with past medical history of COPD, grade 1 diastolic heart failure, SSS,  recurrent pleural effusions, Covid infection in June of this year admitted on 12/03/2020 with urosepsis, Covid positivity (low CT not likely to be an active infection), bilateral pleural effusions (presumed d/t hypoalbuminemia), lower extremity DVT.  Palliative consulted for [place]. 9/22- Blood cultures have also now returned growing Staph epidermis- she has been started on vancomycin.  P.o. intake appears to remain minimal.  Infectious disease consulted with recommendations to stop antibiotics and for no further ID workup.   Subjective: Patient is sitting up in a chair.  She appears weak.  She is able to answer a few questions appropriately.  She mumbles words I cannot understand.  And then she says I am sorry.  Call placed and discussed with daughter [name] who arrived with her sister at the bedside and a family meeting regarding next best steps, broad goals of care and appropriate disposition options discussion was held, see below   Review of Systems  Unable to perform ROS: Mental status change    Physical Exam Vitals and nursing note reviewed.  Constitutional:      Appearance: She is ill-appearing.  Pulmonary:     Effort: Pulmonary effort is normal.  Neurological:     Mental Status: She is alert.     Comments: Unintelligible mouthing of words, no phonation, not following commands             Vital Signs: BP (!) 165/67 (BP Location: Right Arm)   Pulse 76   Temp 98.7 F (37.1 C) (Oral)   Resp 16   Ht 5\' 2"  (1.575 m)   Wt 59.2 kg   SpO2 99%   BMI 23.87 kg/m  SpO2: SpO2: 99 % O2 Device: O2 Device: High Flow Nasal Cannula O2 Flow Rate: O2 Flow Rate (L/min): 53 L/min  Intake/output summary:  Intake/Output Summary (Last 24 hours) at 12/12/2020 1408 Last data filed at 12/12/2020 0500 Gross per 24 hour  Intake 240 ml  Output 125 ml  Net 115 ml    LBM: Last BM Date: 12/10/20 Baseline Weight: Weight: 59 kg Most recent weight: Weight: 59.2 kg       Palliative Assessment/Data: PPS: 20%      Patient Active Problem List   Diagnosis Date Noted   Bacteremia    Sepsis ([place]) 12/03/2020   COVID-19 virus infection 12/03/2020   Acute lower UTI 12/03/2020   Cellulitis 12/03/2020   Chronic diastolic CHF (congestive heart failure) ([place]) 12/03/2020   Hypoalbuminemia 12/03/2020   AKI (acute kidney injury) ([place]) 12/03/2020   Pleural effusion on left 09/12/2020   COPD (chronic obstructive pulmonary disease) ([place]) 09/12/2020   Sick sinus syndrome ([place]) 09/12/2020   Hypoxia 09/12/2020    Palliative Care Assessment & Plan    Assessment/Recommendations/Plan  Agree with DNR Continue current plan for now.  Family meeting: Broad goals of care discussions were held with the patient's 2 daughters present at the bedside.  Brief life review performed.  Patient was independent in functional up until May of this year.  She had hip fracture and then she had COVID.  She has had some progressive functional decline however she did well in rehab facility.  We reviewed about how this current hospitalization is going.  Goals wishes and values attempted to be explored.  We compared and contrasted continuing current scope of medical care while considering another skilled nursing facility rehabilitation attempt versus more of a comfort focused pathway of care. Plan remains to continue  current mode of care, to continue current hospitalization and possibly for SNF rehab with palliative.    Code Status: DNR  Prognosis:  Guarded, patient has high likelihood of ongoing decline.  Discharge Planning: To Be Determined  Care plan was discussed with patient's daughters, Dr Cruzita Lederer, [facility] colleague Ms Myraette and care team.  Thank you for allowing the Palliative Medicine Team to assist in the care of this patient.  Total time: 35 Prolonged billing:      Greater than 50%  of this time was spent counseling and coordinating care related to the above assessment and plan.  Loistine Chance MD Palliative Medicine   Please contact Palliative Medicine Team phone at [phone] for questions and concerns from 7 AM to 7  PM. After 7 PM, please call primary service.

## 2020-12-12 NOTE — Progress Notes (Signed)
FC d/c per VO from Dr. Cruzita Lederer at [phone] ... pt tolerated well ... education given.

## 2020-12-12 NOTE — Progress Notes (Signed)
PROGRESS NOTE  Samantha Fitzgerald WUJ:[id number] DOB: 07-03-1924 DOA: 12/03/2020 PCP: System, Provider Not In   LOS: 9 days   Brief Narrative / Interim history: 87 year old female with COPD on 2 L at baseline, sick sinus syndrome, history of SVT, diastolic CHF with recurrent large left-sided pleural effusion comes into the hospital with hypotension, weakness and shortness of breath.  She was found to have severe sepsis with Proteus UTI, left lower extremity cellulitis and volume overload with pleural effusion.  She was placed on antibiotics and underwent a thoracentesis.  She had an episode of SVT on 9/24 that terminated with metoprolol IV.  Subjective / 24h Interval events: She is confused this morning, appears alert but not oriented to situation  Assessment & Plan: Principal Problem Severe sepsis secondary to polymicrobial UTI, POA Questionable MDR staph epidermidis bacteremia Left lower extremity cellulitis -she received cefepime on 9/19, ceftriaxone 9/20 and then resumed back on cefepime on 9/21, last dose was 9/27, currently status post 7 days.  Urine cultures grew Proteus as well as Enterococcus -Initial blood cultures were positive for staph epidermidis, 3/4 bottles.  She is status post 8 days of vancomycin -Consulted ID today to assist with duration of treatment.  2D echo without vegetations.  Active Problems Acute metabolic encephalopathy, improving -Likely multifactorial in setting of infection, sundowning/hospital delirium, polypharmacy. -Her mental status appears to fluctuate   Acute on chronic hypoxic respiratory failure, improving -Likely related to symptomatic pleural effusions (R > L) -Left Thoracentesis 12/05/2020 with 800cc red-tinged w/protein <3, cyto pending, with negative gram stain. Thora last done 6/30 with 850cc removed. -Continue to titrate oxygen down to baseline 2 L nasal cannula    Acute on chronic diastolic CHF -IVF discontinued -Continue to hold  diuretics in the setting of poor p.o. intake, monitor for dehydration  -will likely resume in the next few days pending p.o. tolerance and volume status.     Acute DVT of the right common femoral vein and SF junction:  -Continue lovenox 1mg /kg q12h -further discussion with family and patient about risks of full dose anticoagulation in the setting of a patient with high fall risk and ambulatory dysfunction -Benefits of anticoagulation in the hospital and at SNF while monitored certainly outweigh the risks, given patient's independent living situation however this may need to be readdressed after rehab if she is a high fall risk   SVT, transient, resolved:  -Converted to NSR after recurrent digoxin dose - continue digoxin (level 1.4) and flecainide.  -Metoprolol was held due to hypotension initially, continue low dose -Cardiology consulted, currently seems to be tolerating metoprolol 12.5 along with digoxin   Abnormal spastic/choreiform movement, transient  -Without focal deficits but patient has occasional episode of "twitching" and "spasm" of arms and legs on occasion with intention somewhat limiting physical therapy -Discontinued Remeron, unclear if secondary to polypharmacy, recent infection or some chronic underlying issue that has surfaced in the setting of profound sepsis   COPD: Not in acute exacerbation -Continue inhaled therapies.    History of covid-19 infection -Previously treated with remdesivir and steroids.  -No pulmonary symptoms, infiltrate on CXR. CT not consistent with active infection.  -No isolation/Tx required.     Hypokalemia/Hypomagnesemia -Continue monitoring.   Scheduled Meds:  albuterol  2 puff Inhalation BID   Chlorhexidine Gluconate Cloth  6 each Topical Q0600   digoxin  0.125 mg Oral Daily   enoxaparin (LOVENOX) injection  1 mg/kg Subcutaneous Q12H   feeding supplement  237 mL Oral BID BM  flecainide  75 mg Oral BID   fluticasone furoate-vilanterol  1  puff Inhalation Daily   And   umeclidinium bromide  1 puff Inhalation Daily   metoprolol tartrate  12.5 mg Oral BID   multivitamin with minerals  1 tablet Oral Daily   sodium chloride flush  3 mL Intravenous Q12H   Continuous Infusions:  sodium chloride     PRN Meds:.sodium chloride, acetaminophen **OR** acetaminophen, albuterol, sodium chloride flush  Diet Orders (From admission, onward)     Start     Ordered   12/11/20 1443  Diet regular Room service appropriate? Yes; Fluid consistency: Thin  Diet effective now       Question Answer Comment  Room service appropriate? Yes   Fluid consistency: Thin      12/11/20 1443            DVT prophylaxis: SCDs Start: 12/03/20 2057     Code Status: DNR  Family Communication: no family at bedside   Status is: Inpatient  Remains inpatient appropriate because:IV treatments appropriate due to intensity of illness or inability to take PO  Dispo: The patient is from: SNF              Anticipated d/c is to: SNF              Patient currently is not medically stable to d/c.   Difficult to place patient No  Level of care: Progressive  Consultants:  Cardiology ID  Procedures:  2D echo:  IMPRESSIONS  1. ? patient in rapid flutter during exam . Left ventricular ejection fraction, by estimation, is 60 to 65%. The left ventricle has normal function. The left ventricle has no regional wall motion abnormalities. Left ventricular diastolic parameters are indeterminate.   2. Right ventricular systolic function is normal. The right ventricular size is normal.   3. The mitral valve is normal in structure. No evidence of mitral valve regurgitation. No evidence of mitral stenosis.   4. The aortic valve is tricuspid. Aortic valve regurgitation is not visualized. Mild to moderate aortic valve sclerosis/calcification is present, without any evidence of aortic stenosis.   5. The inferior vena cava is normal in size with greater than 50%  respiratory variability, suggesting right atrial pressure of 3 mmHg.   Doppler US RIGHT:  - Findings consistent with acute deep vein thrombosis involving the right  common femoral vein, and SF junction.  - Findings consistent with acute superficial vein thrombosis involving the  right great saphenous vein.  - Findings consistent with age indeterminate deep vein thrombosis  involving the right proximal profunda vein.   Objective: Vitals:   12/12/20 0429 12/12/20 0431 12/12/20 0700 12/12/20 0850  BP: (!) 165/67     Pulse: 76     Resp: 16     Temp: 98.7 F (37.1 C)     TempSrc: Oral     SpO2: 99%  99% 99%  Weight:  59.2 kg    Height:        Intake/Output Summary (Last 24 hours) at 12/12/2020 1022 Last data filed at 12/12/2020 0500 Gross per 24 hour  Intake 290 ml  Output 125 ml  Net 165 ml   Filed Weights   12/09/20 0425 12/10/20 0433 12/12/20 0431  Weight: 60.3 kg 59.5 kg 59.2 kg    Examination:  Constitutional: NAD Eyes: no scleral icterus ENMT: Mucous membranes are moist.  Neck: normal, supple Respiratory: clear to auscultation bilaterally, no wheezing, no crackles. Normal respiratory effort.  Cardiovascular: Regular rate and rhythm, no murmurs / rubs / gallops.  Abdomen: non distended, no tenderness. Bowel sounds positive.  Musculoskeletal: no clubbing / cyanosis.  Skin: no rashes Neurologic: no focal deficits  Data Reviewed: I have independently reviewed following labs and imaging studies   CBC: Recent Labs  Lab 12/06/20 0355 12/07/20 0341 12/08/20 0354 12/09/20 0318 12/10/20 0326 12/11/20 0416 12/12/20 0409  WBC 12.7* 13.1* 15.3* 13.1* 14.7* 12.6* 14.1*  NEUTROABS 10.7* 10.3* 12.3* 10.1*  --   --   --   HGB 10.6* 10.8* 11.6* 11.2* 11.4* 10.3* 10.3*  HCT 32.2* 33.0* 36.4 35.8* 35.5* 32.0* 32.1*  MCV 87.7 88.7 89.7 90.6 90.3 90.4 90.9  PLT 327 310 341 310 341 336 269   Basic Metabolic Panel: Recent Labs  Lab 12/08/20 0354 12/09/20 0318  12/10/20 0326 12/11/20 0416 12/12/20 0409  NA 135 138 134* 137 137  K 4.4 4.6 4.6 4.3 4.3  CL 90* 90* 91* 97* 98  CO2 37* 39* 35* 33* 32  GLUCOSE 143* 99 93 90 91  BUN 24* 29* 25* 25* 27*  CREATININE 0.67 0.69 0.68 0.62 0.64  CALCIUM 8.7* 8.9 8.4* 8.3* 8.6*  MG  --  2.2  --   --   --    Liver Function Tests: Recent Labs  Lab 12/06/20 0355 12/07/20 0341 12/08/20 0354  AST 14* 18 28  ALT 10 12 17   ALKPHOS 77 76 80  BILITOT 0.5 0.4 0.6  PROT 5.3* 5.1* 5.6*  ALBUMIN 2.5* 2.4* 2.4*   Coagulation Profile: No results for input(s): INR, PROTIME in the last 168 hours. HbA1C: No results for input(s): HGBA1C in the last 72 hours. CBG: No results for input(s): GLUCAP in the last 168 hours.  Recent Results (from the past 240 hour(s))  Culture, blood (Routine x 2)     Status: Abnormal   Collection Time: 12/03/20 10:54 AM   Specimen: BLOOD LEFT FOREARM  Result Value Ref Range Status   Specimen Description   Final    BLOOD LEFT FOREARM Performed at [facility] [address], [place], [place] [address]    Special Requests   Final    BOTTLES DRAWN AEROBIC AND ANAEROBIC Blood Culture adequate volume Performed at [facility] [address] [place], [place] [address]    Culture  Setup Time   Final    GRAM POSITIVE COCCI IN CLUSTERS AEROBIC BOTTLE ONLY Organism ID to follow CRITICAL RESULT CALLED TO, READ BACK BY AND VERIFIED WITH: A TUCKER PHARMD 1652 12/04/20 A BROWNING    Culture (A)  Final    STAPHYLOCOCCUS EPIDERMIDIS SUSCEPTIBILITIES PERFORMED ON PREVIOUS CULTURE WITHIN THE LAST 5 DAYS. Performed at [facility], [place] [address] [place], [place] [address]    Report Status 12/07/2020 FINAL  Final  Blood Culture ID Panel (Reflexed)     Status: Abnormal   Collection Time: 12/03/20 10:54 AM  Result Value Ref Range Status   Enterococcus faecalis NOT DETECTED NOT DETECTED Final   Enterococcus Faecium NOT DETECTED NOT DETECTED  Final   Listeria monocytogenes NOT DETECTED NOT DETECTED Final   Staphylococcus species DETECTED (A) NOT DETECTED Final    Comment: CRITICAL RESULT CALLED TO, READ BACK BY AND VERIFIED WITH: A TUCKER PHARMD 1652 12/04/20 A BROWNING    Staphylococcus aureus (BCID) NOT DETECTED NOT DETECTED Final   Staphylococcus epidermidis DETECTED (A) NOT DETECTED Final    Comment: Methicillin (oxacillin) resistant coagulase negative staphylococcus. Possible blood culture  contaminant (unless isolated from more than one blood culture draw or clinical case suggests pathogenicity). No antibiotic treatment is indicated for blood  culture contaminants. CRITICAL RESULT CALLED TO, READ BACK BY AND VERIFIED WITH: A TUCKER PHARMD 1652 12/04/20 A BROWNING    Staphylococcus lugdunensis NOT DETECTED NOT DETECTED Final   Streptococcus species NOT DETECTED NOT DETECTED Final   Streptococcus agalactiae NOT DETECTED NOT DETECTED Final   Streptococcus pneumoniae NOT DETECTED NOT DETECTED Final   Streptococcus pyogenes NOT DETECTED NOT DETECTED Final   A.calcoaceticus-baumannii NOT DETECTED NOT DETECTED Final   Bacteroides fragilis NOT DETECTED NOT DETECTED Final   Enterobacterales NOT DETECTED NOT DETECTED Final   Enterobacter cloacae complex NOT DETECTED NOT DETECTED Final   Escherichia coli NOT DETECTED NOT DETECTED Final   Klebsiella aerogenes NOT DETECTED NOT DETECTED Final   Klebsiella oxytoca NOT DETECTED NOT DETECTED Final   Klebsiella pneumoniae NOT DETECTED NOT DETECTED Final   Proteus species NOT DETECTED NOT DETECTED Final   Salmonella species NOT DETECTED NOT DETECTED Final   Serratia marcescens NOT DETECTED NOT DETECTED Final   Haemophilus influenzae NOT DETECTED NOT DETECTED Final   Neisseria meningitidis NOT DETECTED NOT DETECTED Final   Pseudomonas aeruginosa NOT DETECTED NOT DETECTED Final   Stenotrophomonas maltophilia NOT DETECTED NOT DETECTED Final   Candida albicans NOT DETECTED NOT DETECTED Final    Candida auris NOT DETECTED NOT DETECTED Final   Candida glabrata NOT DETECTED NOT DETECTED Final   Candida krusei NOT DETECTED NOT DETECTED Final   Candida parapsilosis NOT DETECTED NOT DETECTED Final   Candida tropicalis NOT DETECTED NOT DETECTED Final   Cryptococcus neoformans/gattii NOT DETECTED NOT DETECTED Final   Methicillin resistance mecA/C DETECTED (A) NOT DETECTED Final    Comment: CRITICAL RESULT CALLED TO, READ BACK BY AND VERIFIED WITHAustin Miles PHARMD 1652 12/04/20 A BROWNING Performed at [facility], [address], [place], [place] [address]   Culture, blood (Routine x 2)     Status: Abnormal   Collection Time: 12/03/20 10:59 AM   Specimen: BLOOD  Result Value Ref Range Status   Specimen Description   Final    BLOOD RIGHT ANTECUBITAL Performed at [facility] [address] [place], [place] [address]    Special Requests   Final    BOTTLES DRAWN AEROBIC AND ANAEROBIC Blood Culture adequate volume Performed at [facility] [address] [place], [place] [address]    Culture  Setup Time   Final    GRAM POSITIVE COCCI IN CLUSTERS AEROBIC BOTTLE ONLY CRITICAL VALUE NOTED.  VALUE IS CONSISTENT WITH PREVIOUSLY REPORTED AND CALLED VALUE. Performed at [facility], [place] [address], [place], [place] [address]    Culture STAPHYLOCOCCUS EPIDERMIDIS (A)  Final   Report Status 12/07/2020 FINAL  Final   Organism ID, Bacteria STAPHYLOCOCCUS EPIDERMIDIS  Final      Susceptibility   Staphylococcus epidermidis - MIC*    CIPROFLOXACIN >=8 RESISTANT Resistant     ERYTHROMYCIN >=8 RESISTANT Resistant     GENTAMICIN >=16 RESISTANT Resistant     OXACILLIN >=4 RESISTANT Resistant     TETRACYCLINE 2 SENSITIVE Sensitive     VANCOMYCIN 4 SENSITIVE Sensitive     TRIMETH/SULFA 80 RESISTANT Resistant     CLINDAMYCIN >=8 RESISTANT Resistant     RIFAMPIN <=0.5 SENSITIVE Sensitive     Inducible Clindamycin NEGATIVE Sensitive     *  STAPHYLOCOCCUS EPIDERMIDIS  Resp Panel by RT-PCR (Flu A&B, Covid) Nasopharyngeal  Swab     Status: Abnormal   Collection Time: 12/03/20 12:03 PM   Specimen: Nasopharyngeal Swab; Nasopharyngeal(NP) swabs in vial transport medium  Result Value Ref Range Status   SARS Coronavirus 2 by RT PCR POSITIVE (A) NEGATIVE Final    Comment: RESULT CALLED TO, READ BACK BY AND VERIFIED WITH: JACOBS,D. RN @1310  ON 12/03/2020 BY COHEN,K (NOTE) SARS-CoV-2 target nucleic acids are DETECTED.  The SARS-CoV-2 RNA is generally detectable in upper respiratory specimens during the acute phase of infection. Positive results are indicative of the presence of the identified virus, but do not rule out bacterial infection or co-infection with other pathogens not detected by the test. Clinical correlation with patient history and other diagnostic information is necessary to determine patient infection status. The expected result is Negative.  Fact Sheet for Patients: EntrepreneurPulse.com.au  Fact Sheet for Healthcare Providers: IncredibleEmployment.be  This test is not yet approved or cleared by the [place] FDA and  has been authorized for detection and/or diagnosis of SARS-CoV-2 by FDA under an Emergency Use Authorization (EUA).  This EUA will remain in effect (meaning this t est can be used) for the duration of  the COVID-19 declaration under Section 564(b)(1) of the Act, 21 U.S.C. section 360bbb-3(b)(1), unless the authorization is terminated or revoked sooner.     Influenza A by PCR NEGATIVE NEGATIVE Final   Influenza B by PCR NEGATIVE NEGATIVE Final    Comment: (NOTE) The Xpert Xpress SARS-CoV-2/FLU/RSV plus assay is intended as an aid in the diagnosis of influenza from Nasopharyngeal swab specimens and should not be used as a sole basis for treatment. Nasal washings and aspirates are unacceptable for Xpert Xpress SARS-CoV-2/FLU/RSV testing.  Fact Sheet for  Patients: EntrepreneurPulse.com.au  Fact Sheet for Healthcare Providers: IncredibleEmployment.be  This test is not yet approved or cleared by the [place] FDA and has been authorized for detection and/or diagnosis of SARS-CoV-2 by FDA under an Emergency Use Authorization (EUA). This EUA will remain in effect (meaning this test can be used) for the duration of the COVID-19 declaration under Section 564(b)(1) of the Act, 21 U.S.C. section 360bbb-3(b)(1), unless the authorization is terminated or revoked.  Performed at [facility], [place] [address], [place], [place] [address]   Urine Culture     Status: Abnormal   Collection Time: 12/03/20  2:59 PM   Specimen: Urine, Clean Catch  Result Value Ref Range Status   Specimen Description   Final    URINE, CLEAN CATCH Performed at [facility], [place] [address] [place], [place] [address]    Special Requests   Final    NONE Performed at [facility], [place] [address], [place], [place] [address]    Culture (A)  Final    >=100,000 COLONIES/mL PROTEUS VULGARIS >=100,000 COLONIES/mL ENTEROCOCCUS FAECALIS    Report Status 12/07/2020 FINAL  Final   Organism ID, Bacteria PROTEUS VULGARIS (A)  Final   Organism ID, Bacteria ENTEROCOCCUS FAECALIS (A)  Final      Susceptibility   Enterococcus faecalis - MIC*    AMPICILLIN <=2 SENSITIVE Sensitive     NITROFURANTOIN <=16 SENSITIVE Sensitive     VANCOMYCIN 1 SENSITIVE Sensitive     * >=100,000 COLONIES/mL ENTEROCOCCUS FAECALIS   Proteus vulgaris - MIC*    AMPICILLIN >=32 RESISTANT Resistant     CEFAZOLIN >=64 RESISTANT Resistant     CEFEPIME <=0.12 SENSITIVE Sensitive     CIPROFLOXACIN <=0.25 SENSITIVE Sensitive     GENTAMICIN <=1  SENSITIVE Sensitive     IMIPENEM 2 SENSITIVE Sensitive     NITROFURANTOIN 128 RESISTANT Resistant     TRIMETH/SULFA <=20 SENSITIVE Sensitive     AMPICILLIN/SULBACTAM 16  INTERMEDIATE Intermediate     PIP/TAZO <=4 SENSITIVE Sensitive     * >=100,000 COLONIES/mL PROTEUS VULGARIS  MRSA Next Gen by PCR, Nasal     Status: None   Collection Time: 12/04/20  4:06 PM   Specimen: Nasal Mucosa; Nasal Swab  Result Value Ref Range Status   MRSA by PCR Next Gen NOT DETECTED NOT DETECTED Final    Comment: (NOTE) The GeneXpert MRSA Assay (FDA approved for NASAL specimens only), is one component of a comprehensive MRSA colonization surveillance program. It is not intended to diagnose MRSA infection nor to guide or monitor treatment for MRSA infections. Test performance is not FDA approved in patients less than 14 years old. Performed at [facility], [place] [address] [place], [place] [address]   Culture, blood (routine x 2)     Status: None   Collection Time: 12/05/20  7:38 AM   Specimen: BLOOD RIGHT HAND  Result Value Ref Range Status   Specimen Description   Final    BLOOD RIGHT HAND Performed at [facility] [address], [place], [place] [address]    Special Requests   Final    BOTTLES DRAWN AEROBIC ONLY Blood Culture adequate volume Performed at [facility] [address], [place], [place] [address]    Culture   Final    NO GROWTH 5 DAYS Performed at [facility], [place] [address], [place], [place] [address]    Report Status 12/10/2020 FINAL  Final  Culture, blood (routine x 2)     Status: None   Collection Time: 12/05/20  7:38 AM   Specimen: BLOOD  Result Value Ref Range Status   Specimen Description   Final    BLOOD RIGHT ANTECUBITAL Performed at [facility] [address] [place], [place] [address]    Special Requests   Final    BOTTLES DRAWN AEROBIC ONLY Blood Culture adequate volume Performed at [facility] [address], [place], [place] [address]    Culture   Final    NO GROWTH 5 DAYS Performed at [facility], [place] [address], [place], [place] [address]    Report Status 12/10/2020 FINAL  Final  Body fluid culture w Gram Stain     Status: None   Collection Time: 12/05/20  3:27 PM   Specimen: PATH Cytology Pleural fluid  Result Value Ref Range Status   Specimen Description   Final    PLEURAL Performed at [facility] [address], [place], [place] [address]    Special Requests   Final    NONE Performed at [facility], [place] [address], [place], [place] [address]    Gram Stain   Final    FEW SQUAMOUS EPITHELIAL CELLS PRESENT FEW WBC SEEN NO ORGANISMS SEEN    Culture   Final    NO GROWTH 3 DAYS Performed at [facility], [address], [place],  [address]    Report Status 12/09/2020 FINAL  Final     Radiology Studies: No results found.   Marzetta Board, MD, PhD [facility]  Between 7 am - 7 pm I am available, please contact me via Amion (for emergencies) or Securechat (non urgent messages)  Between  7 pm - 7 am I am not available, please contact night coverage MD/APP via Amion

## 2020-12-12 NOTE — Consult Note (Signed)
[facility]    Date of Admission:  12/03/2020     Reason for Consult: sepsis/bacteremia    Referring Provider: Cruzita Lederer   Lines:  Peripheral iv's  Abx: 9/19-27 vanc 9/19-27 cefepime        Assessment: 87 yo female with copd on home o2, HFpEF, covid infection 08/2020, chronic pleural effusion, admitted 9/19 with sepsis of 1-2 days found to have staph epi bacteremia of high burden along with bacteriuria and bilateral pleural effusion  Effusion tapped, appear transudate. Pathology pending. Cx negative She had similar left pleural fluid analysis 08/2020 that was also transudative and on path no malignant cells  She has no intravascular catheter  I am unclear on what the source of sepsis was. She has 2 of 2 set staph epi bacteremia that quickly cleared and negative tte; suspect might have invaded blood stream from the skin maceration on her legs. She also has 2 species of bacteria in her urine. Both conditions if presumed infectious well treated.   At this time not concerned about persistent of staph epi, which usually occurs only in setting prosthetic intravascular device or joints  Her sepsis had resolved  Plan: Agree with stopping abx No further ID workup needed Ok to discharge from id standpoint when ready per primary team ID will sign off Discussed with primary team   I spent 60 minute reviewing data/chart, and coordinating care and >50% direct face to face time providing counseling/discussing diagnostics/treatment plan with patient       ------------------------------------------------ Active Problems:   Pleural effusion on left   COPD (chronic obstructive pulmonary disease) (HCC)   Sick sinus syndrome (HCC)   Sepsis ([place])   COVID-19 virus infection   Acute lower UTI   Cellulitis   Chronic diastolic CHF (congestive heart failure) (HCC)   Hypoalbuminemia   AKI (acute kidney injury) ([place])    HPI: Corlene Claudetta Sallie is  a 87 y.o. female copd on home o2, chronic pleural effusion, HFpEF, admitted with acute ams, fatigue, meeting sepsis criteria   She develop 1 day acute onset fatigue confusion, decreased appetite  She doesn't recall much of the initiating events/sign-sx  She currently feels well although appears disoriented  She has skin maceration on legs but doesn't recall when how got it  On presentation afebrile, but sbp drops to 80s and has mild leukocytosis and was hypoactive delirious Cxr with bilateral pleural effusion -- I noted no cardiac device Bcx obtained ultimately grew staph epi Urine cs e faecalis and proteus vulgaris She was started on vanc/cefepime which was continued for a week Tte normal ef, no obvious sign endocarditis  She has no chronic intravascular device   Denies rash, n/v/diarrhea, dysuria, flank pain, focal joint tenderness  Family History  Problem Relation Age of Onset   Hypertension Other     Social History   Tobacco Use   Smoking status: Former    Packs/day: 1.00    Years: 25.00    Pack years: 25.00    Types: Cigarettes    Quit date: 10/01/1963    Years since quitting: 57.2    Passive exposure: Never   Smokeless tobacco: Never  Vaping Use   Vaping Use: Never used  Substance Use Topics   Alcohol use: Never   Drug use: Never    Allergies  Allergen Reactions   Amoxicillin Other (See Comments)    Listed on MAR   Doxycycline Other (See Comments)  Listed on MAR   Levofloxacin Other (See Comments)    Listed on MAR   Morphine And Related Other (See Comments)    Listed on MAR    Review of Systems: ROS All Other ROS was negative, except mentioned above   History reviewed. No pertinent past medical history.     Scheduled Meds:  albuterol  2 puff Inhalation BID   Chlorhexidine Gluconate Cloth  6 each Topical Q0600   digoxin  0.125 mg Oral Daily   enoxaparin (LOVENOX) injection  1 mg/kg Subcutaneous Q12H   feeding supplement  237 mL Oral BID BM    flecainide  75 mg Oral BID   fluticasone furoate-vilanterol  1 puff Inhalation Daily   And   umeclidinium bromide  1 puff Inhalation Daily   metoprolol tartrate  12.5 mg Oral BID   multivitamin with minerals  1 tablet Oral Daily   sodium chloride flush  3 mL Intravenous Q12H   Continuous Infusions:  sodium chloride     PRN Meds:.sodium chloride, acetaminophen **OR** acetaminophen, albuterol, sodium chloride flush   OBJECTIVE: Blood pressure (!) 165/67, pulse 76, temperature 98.7 F (37.1 C), temperature source Oral, resp. rate 16, height 5\' 2"  (1.575 m), weight 59.2 kg, SpO2 99 %.  Physical Exam  General/constitutional: no distress, pleasant, conversant; on 4 liters o2; thin HEENT: Normocephalic, PER, Conj Clear, EOMI, Oropharynx clear Neck supple CV: rrr no mrg Lungs: normal respiratory effort Abd: Soft, Nontender Ext: trace to 1+ bilateral distal LE edema Skin: maceration skin bilateral LE Neuro: nonfocal; generalized weakness MSK: no peripheral joint swelling/tenderness/warmth; back spines nontender Psych: alert but disoriented   Lab Results Lab Results  Component Value Date   WBC 14.1 (H) 12/12/2020   HGB 10.3 (L) 12/12/2020   HCT 32.1 (L) 12/12/2020   MCV 90.9 12/12/2020   PLT 313 12/12/2020    Lab Results  Component Value Date   CREATININE 0.64 12/12/2020   BUN 27 (H) 12/12/2020   NA 137 12/12/2020   K 4.3 12/12/2020   CL 98 12/12/2020   CO2 32 12/12/2020    Lab Results  Component Value Date   ALT 17 12/08/2020   AST 28 12/08/2020   ALKPHOS 80 12/08/2020   BILITOT 0.6 12/08/2020      Microbiology: Recent Results (from the past 240 hour(s))  Culture, blood (Routine x 2)     Status: Abnormal   Collection Time: 12/03/20 10:54 AM   Specimen: BLOOD LEFT FOREARM  Result Value Ref Range Status   Specimen Description   Final    BLOOD LEFT FOREARM Performed at [facility] [address] [place], [place] [address]    Special  Requests   Final    BOTTLES DRAWN AEROBIC AND ANAEROBIC Blood Culture adequate volume Performed at [facility] [address], [place], [place] [address]    Culture  Setup Time   Final    GRAM POSITIVE COCCI IN CLUSTERS AEROBIC BOTTLE ONLY Organism ID to follow CRITICAL RESULT CALLED TO, READ BACK BY AND VERIFIED WITH: A TUCKER PHARMD 1652 12/04/20 A BROWNING    Culture (A)  Final    STAPHYLOCOCCUS EPIDERMIDIS SUSCEPTIBILITIES PERFORMED ON PREVIOUS CULTURE WITHIN THE LAST 5 DAYS. Performed at [facility], [place] [address], [place], [place] [address]    Report Status 12/07/2020 FINAL  Final  Blood Culture ID Panel (Reflexed)     Status: Abnormal   Collection Time: 12/03/20 10:54 AM  Result Value Ref Range  Status   Enterococcus faecalis NOT DETECTED NOT DETECTED Final   Enterococcus Faecium NOT DETECTED NOT DETECTED Final   Listeria monocytogenes NOT DETECTED NOT DETECTED Final   Staphylococcus species DETECTED (A) NOT DETECTED Final    Comment: CRITICAL RESULT CALLED TO, READ BACK BY AND VERIFIED WITH: A TUCKER PHARMD 1652 12/04/20 A BROWNING    Staphylococcus aureus (BCID) NOT DETECTED NOT DETECTED Final   Staphylococcus epidermidis DETECTED (A) NOT DETECTED Final    Comment: Methicillin (oxacillin) resistant coagulase negative staphylococcus. Possible blood culture contaminant (unless isolated from more than one blood culture draw or clinical case suggests pathogenicity). No antibiotic treatment is indicated for blood  culture contaminants. CRITICAL RESULT CALLED TO, READ BACK BY AND VERIFIED WITH: A TUCKER PHARMD 1652 12/04/20 A BROWNING    Staphylococcus lugdunensis NOT DETECTED NOT DETECTED Final   Streptococcus species NOT DETECTED NOT DETECTED Final   Streptococcus agalactiae NOT DETECTED NOT DETECTED Final   Streptococcus pneumoniae NOT DETECTED NOT DETECTED Final   Streptococcus pyogenes NOT DETECTED NOT DETECTED Final   A.calcoaceticus-baumannii  NOT DETECTED NOT DETECTED Final   Bacteroides fragilis NOT DETECTED NOT DETECTED Final   Enterobacterales NOT DETECTED NOT DETECTED Final   Enterobacter cloacae complex NOT DETECTED NOT DETECTED Final   Escherichia coli NOT DETECTED NOT DETECTED Final   Klebsiella aerogenes NOT DETECTED NOT DETECTED Final   Klebsiella oxytoca NOT DETECTED NOT DETECTED Final   Klebsiella pneumoniae NOT DETECTED NOT DETECTED Final   Proteus species NOT DETECTED NOT DETECTED Final   Salmonella species NOT DETECTED NOT DETECTED Final   Serratia marcescens NOT DETECTED NOT DETECTED Final   Haemophilus influenzae NOT DETECTED NOT DETECTED Final   Neisseria meningitidis NOT DETECTED NOT DETECTED Final   Pseudomonas aeruginosa NOT DETECTED NOT DETECTED Final   Stenotrophomonas maltophilia NOT DETECTED NOT DETECTED Final   Candida albicans NOT DETECTED NOT DETECTED Final   Candida auris NOT DETECTED NOT DETECTED Final   Candida glabrata NOT DETECTED NOT DETECTED Final   Candida krusei NOT DETECTED NOT DETECTED Final   Candida parapsilosis NOT DETECTED NOT DETECTED Final   Candida tropicalis NOT DETECTED NOT DETECTED Final   Cryptococcus neoformans/gattii NOT DETECTED NOT DETECTED Final   Methicillin resistance mecA/C DETECTED (A) NOT DETECTED Final    Comment: CRITICAL RESULT CALLED TO, READ BACK BY AND VERIFIED WITHAustin Miles PHARMD 1652 12/04/20 A BROWNING Performed at [facility], [address], [place], [place] [address]   Culture, blood (Routine x 2)     Status: Abnormal   Collection Time: 12/03/20 10:59 AM   Specimen: BLOOD  Result Value Ref Range Status   Specimen Description   Final    BLOOD RIGHT ANTECUBITAL Performed at [facility] [address] [place], [place] [address]    Special Requests   Final    BOTTLES DRAWN AEROBIC AND ANAEROBIC Blood Culture adequate volume Performed at [facility] [address], [place], [place] [address]     Culture  Setup Time   Final    GRAM POSITIVE COCCI IN CLUSTERS AEROBIC BOTTLE ONLY CRITICAL VALUE NOTED.  VALUE IS CONSISTENT WITH PREVIOUSLY REPORTED AND CALLED VALUE. Performed at [facility], [place] [address] [place],  [address]    Culture STAPHYLOCOCCUS EPIDERMIDIS (A)  Final   Report Status 12/07/2020 FINAL  Final   Organism ID, Bacteria STAPHYLOCOCCUS EPIDERMIDIS  Final      Susceptibility   Staphylococcus epidermidis - MIC*    CIPROFLOXACIN >=  8 RESISTANT Resistant     ERYTHROMYCIN >=8 RESISTANT Resistant     GENTAMICIN >=16 RESISTANT Resistant     OXACILLIN >=4 RESISTANT Resistant     TETRACYCLINE 2 SENSITIVE Sensitive     VANCOMYCIN 4 SENSITIVE Sensitive     TRIMETH/SULFA 80 RESISTANT Resistant     CLINDAMYCIN >=8 RESISTANT Resistant     RIFAMPIN <=0.5 SENSITIVE Sensitive     Inducible Clindamycin NEGATIVE Sensitive     * STAPHYLOCOCCUS EPIDERMIDIS  Resp Panel by RT-PCR (Flu A&B, Covid) Nasopharyngeal Swab     Status: Abnormal   Collection Time: 12/03/20 12:03 PM   Specimen: Nasopharyngeal Swab; Nasopharyngeal(NP) swabs in vial transport medium  Result Value Ref Range Status   SARS Coronavirus 2 by RT PCR POSITIVE (A) NEGATIVE Final    Comment: RESULT CALLED TO, READ BACK BY AND VERIFIED WITH: JACOBS,D. RN @1310  ON 12/03/2020 BY COHEN,K (NOTE) SARS-CoV-2 target nucleic acids are DETECTED.  The SARS-CoV-2 RNA is generally detectable in upper respiratory specimens during the acute phase of infection. Positive results are indicative of the presence of the identified virus, but do not rule out bacterial infection or co-infection with other pathogens not detected by the test. Clinical correlation with patient history and other diagnostic information is necessary to determine patient infection status. The expected result is Negative.  Fact Sheet for Patients: EntrepreneurPulse.com.au  Fact Sheet for Healthcare  Providers: IncredibleEmployment.be  This test is not yet approved or cleared by the [place] FDA and  has been authorized for detection and/or diagnosis of SARS-CoV-2 by FDA under an Emergency Use Authorization (EUA).  This EUA will remain in effect (meaning this t est can be used) for the duration of  the COVID-19 declaration under Section 564(b)(1) of the Act, 21 U.S.C. section 360bbb-3(b)(1), unless the authorization is terminated or revoked sooner.     Influenza A by PCR NEGATIVE NEGATIVE Final   Influenza B by PCR NEGATIVE NEGATIVE Final    Comment: (NOTE) The Xpert Xpress SARS-CoV-2/FLU/RSV plus assay is intended as an aid in the diagnosis of influenza from Nasopharyngeal swab specimens and should not be used as a sole basis for treatment. Nasal washings and aspirates are unacceptable for Xpert Xpress SARS-CoV-2/FLU/RSV testing.  Fact Sheet for Patients: EntrepreneurPulse.com.au  Fact Sheet for Healthcare Providers: IncredibleEmployment.be  This test is not yet approved or cleared by the [place] FDA and has been authorized for detection and/or diagnosis of SARS-CoV-2 by FDA under an Emergency Use Authorization (EUA). This EUA will remain in effect (meaning this test can be used) for the duration of the COVID-19 declaration under Section 564(b)(1) of the Act, 21 U.S.C. section 360bbb-3(b)(1), unless the authorization is terminated or revoked.  Performed at [facility], [place] [address] [place], [place] [address]   Urine Culture     Status: Abnormal   Collection Time: 12/03/20  2:59 PM   Specimen: Urine, Clean Catch  Result Value Ref Range Status   Specimen Description   Final    URINE, CLEAN CATCH Performed at [facility], [place] [address], [place], [place] [address]    Special Requests   Final    NONE Performed at [facility] [place] [address] [place], [place] [address]    Culture (A)  Final    >=100,000 COLONIES/mL PROTEUS VULGARIS >=100,000 COLONIES/mL ENTEROCOCCUS FAECALIS    Report Status 12/07/2020 FINAL  Final   Organism ID, Bacteria PROTEUS VULGARIS (A)  Final   Organism ID, Bacteria  ENTEROCOCCUS FAECALIS (A)  Final      Susceptibility   Enterococcus faecalis - MIC*    AMPICILLIN <=2 SENSITIVE Sensitive     NITROFURANTOIN <=16 SENSITIVE Sensitive     VANCOMYCIN 1 SENSITIVE Sensitive     * >=100,000 COLONIES/mL ENTEROCOCCUS FAECALIS   Proteus vulgaris - MIC*    AMPICILLIN >=32 RESISTANT Resistant     CEFAZOLIN >=64 RESISTANT Resistant     CEFEPIME <=0.12 SENSITIVE Sensitive     CIPROFLOXACIN <=0.25 SENSITIVE Sensitive     GENTAMICIN <=1 SENSITIVE Sensitive     IMIPENEM 2 SENSITIVE Sensitive     NITROFURANTOIN 128 RESISTANT Resistant     TRIMETH/SULFA <=20 SENSITIVE Sensitive     AMPICILLIN/SULBACTAM 16 INTERMEDIATE Intermediate     PIP/TAZO <=4 SENSITIVE Sensitive     * >=100,000 COLONIES/mL PROTEUS VULGARIS  MRSA Next Gen by PCR, Nasal     Status: None   Collection Time: 12/04/20  4:06 PM   Specimen: Nasal Mucosa; Nasal Swab  Result Value Ref Range Status   MRSA by PCR Next Gen NOT DETECTED NOT DETECTED Final    Comment: (NOTE) The GeneXpert MRSA Assay (FDA approved for NASAL specimens only), is one component of a comprehensive MRSA colonization surveillance program. It is not intended to diagnose MRSA infection nor to guide or monitor treatment for MRSA infections. Test performance is not FDA approved in patients less than 33 years old. Performed at [facility], [place] [address] [place], [place] [address]   Culture, blood (routine x 2)     Status: None   Collection Time: 12/05/20  7:38 AM   Specimen: BLOOD RIGHT HAND  Result Value Ref Range Status   Specimen Description   Final    BLOOD RIGHT HAND Performed at [facility] [address] [place], [place]  [address]    Special Requests   Final    BOTTLES DRAWN AEROBIC ONLY Blood Culture adequate volume Performed at [facility] [address], [place], [place] [address]    Culture   Final    NO GROWTH 5 DAYS Performed at [facility], [place] [address] [place], [place] [address]    Report Status 12/10/2020 FINAL  Final  Culture, blood (routine x 2)     Status: None   Collection Time: 12/05/20  7:38 AM   Specimen: BLOOD  Result Value Ref Range Status   Specimen Description   Final    BLOOD RIGHT ANTECUBITAL Performed at [facility] [address] [place], [place] [address]    Special Requests   Final    BOTTLES DRAWN AEROBIC ONLY Blood Culture adequate volume Performed at [facility] [address], [place], [place] [address]    Culture   Final    NO GROWTH 5 DAYS Performed at [facility], [place] [address], [place], [place] [address]    Report Status 12/10/2020 FINAL  Final  Body fluid culture w Gram Stain     Status: None   Collection Time: 12/05/20  3:27 PM   Specimen: PATH Cytology Pleural fluid  Result Value Ref Range Status   Specimen Description   Final    PLEURAL Performed at [facility] [address], [place], [place] [address]    Special Requests   Final    NONE Performed at [facility], [place] [address] [place], [place] [address]    Gram Stain   Final  FEW SQUAMOUS EPITHELIAL CELLS PRESENT FEW WBC SEEN NO ORGANISMS SEEN    Culture   Final    NO GROWTH 3 DAYS Performed at [facility], [place] [address], [place], [place] [address]    Report Status 12/09/2020 FINAL  Final   9/21 left pleural fluid cx negative 9/21 bcx negative 9/19 bcx staph epi 9/19 ucx e faecalis, proteus vulgaris  Serology:  9/21 body fluid analysis 800 mL left pleural effusion <3 protein, 1000 wbc 80% lymph,   Imaging: If present, new imagings (plain films, ct scans,  and mri) have been personally visualized and interpreted; radiology reports have been reviewed. Decision making incorporated into the Impression / Recommendations.  9/24 tte  1. ? patient in rapid flutter during exam . Left ventricular ejection  fraction, by estimation, is 60 to 65%. The left ventricle has normal  function. The left ventricle has no regional wall motion abnormalities.  Left ventricular diastolic parameters are  indeterminate.   2. Right ventricular systolic function is normal. The right ventricular  size is normal.   3. The mitral valve is normal in structure. No evidence of mitral valve  regurgitation. No evidence of mitral stenosis.   4. The aortic valve is tricuspid. Aortic valve regurgitation is not  visualized. Mild to moderate aortic valve sclerosis/calcification is  present, without any evidence of aortic stenosis.   5. The inferior vena cava is normal in size with greater than 50%  respiratory variability, suggesting right atrial pressure of 3 mmHg.  9/21 cxr 1. Tiny left apical pneumothorax. 2. Small left pleural effusion has decreased. 3. Small right pleural effusion is unchanged. 4.  Aortic Atherosclerosis  Jabier Mutton, [facility] [place] [phone] pager    12/12/2020, 10:57 AM

## 2020-12-13 DIAGNOSIS — Z7189 Other specified counseling: Secondary | ICD-10-CM | POA: Diagnosis not present

## 2020-12-13 DIAGNOSIS — R531 Weakness: Secondary | ICD-10-CM | POA: Diagnosis not present

## 2020-12-13 LAB — CBC
HCT: 33.9 % — ABNORMAL LOW (ref 36.0–46.0)
Hemoglobin: 10.8 g/dL — ABNORMAL LOW (ref 12.0–15.0)
MCH: 29.1 pg (ref 26.0–34.0)
MCHC: 31.9 g/dL (ref 30.0–36.0)
MCV: 91.4 fL (ref 80.0–100.0)
Platelets: 329 10*3/uL (ref 150–400)
RBC: 3.71 MIL/uL — ABNORMAL LOW (ref 3.87–5.11)
RDW: 14.3 % (ref 11.5–15.5)
WBC: 13.3 10*3/uL — ABNORMAL HIGH (ref 4.0–10.5)
nRBC: 0 % (ref 0.0–0.2)

## 2020-12-13 LAB — COMPREHENSIVE METABOLIC PANEL
ALT: 21 U/L (ref 0–44)
AST: 27 U/L (ref 15–41)
Albumin: 2.4 g/dL — ABNORMAL LOW (ref 3.5–5.0)
Alkaline Phosphatase: 77 U/L (ref 38–126)
Anion gap: 7 (ref 5–15)
BUN: 26 mg/dL — ABNORMAL HIGH (ref 8–23)
CO2: 33 mmol/L — ABNORMAL HIGH (ref 22–32)
Calcium: 8.8 mg/dL — ABNORMAL LOW (ref 8.9–10.3)
Chloride: 100 mmol/L (ref 98–111)
Creatinine, Ser: 0.64 mg/dL (ref 0.44–1.00)
GFR, Estimated: >60 mL/min (ref >60)
Glucose, Bld: 92 mg/dL (ref 70–99)
Potassium: 4.5 mmol/L (ref 3.5–5.1)
Sodium: 140 mmol/L (ref 135–145)
Total Bilirubin: 0.4 mg/dL (ref 0.3–1.2)
Total Protein: 5.4 g/dL — ABNORMAL LOW (ref 6.5–8.1)

## 2020-12-13 MED ORDER — FUROSEMIDE 20 MG PO TABS
20.0000 mg | ORAL_TABLET | Freq: Every day | ORAL | Status: DC
  Administered 2020-12-14: 20 mg via ORAL
  Filled 2020-12-13: qty 1

## 2020-12-13 MED ORDER — TAMSULOSIN HCL 0.4 MG PO CAPS
0.4000 mg | ORAL_CAPSULE | Freq: Every day | ORAL | Status: DC
  Administered 2020-12-13 – 2020-12-14 (×2): 0.4 mg via ORAL
  Filled 2020-12-13 (×2): qty 1

## 2020-12-13 MED ORDER — APIXABAN 5 MG PO TABS
5.0000 mg | ORAL_TABLET | Freq: Two times a day (BID) | ORAL | Status: DC
  Administered 2020-12-13 – 2020-12-14 (×2): 5 mg via ORAL
  Filled 2020-12-13 (×2): qty 1

## 2020-12-13 NOTE — Progress Notes (Signed)
Daily Progress Note   Patient Name: Samantha Fitzgerald       Date: 12/13/2020 DOB: 02-Mar-1925  Age: 87 y.o. MRN#: [id number] Attending Physician: Caren Griffins, MD Primary Care Physician: System, Provider Not In Admit Date: 12/03/2020  Reason for Consultation/Follow-up: Establishing goals of care  Patient Profile/HPI:   87 y.o. female  with past medical history of COPD, grade 1 diastolic heart failure, SSS,  recurrent pleural effusions, Covid infection in June of this year admitted on 12/03/2020 with urosepsis, Covid positivity (low CT not likely to be an active infection), bilateral pleural effusions (presumed d/t hypoalbuminemia), lower extremity DVT.  Palliative consulted for [facility]. 9/22- Blood cultures have also now returned growing Staph epidermis- she has been started on vancomycin.  P.o. intake appears to remain minimal.  Infectious disease consulted with recommendations to stop antibiotics and for no further ID workup.   Subjective: Patient is resting in bed today, daughter at bedside.  Patient does not appear to be in acute distress.  I rediscussed with daughter again about broad goals of care and appropriate disposition options    Review of Systems  Unable to perform ROS: Mental status change    Physical exam: Patient is awake alert resting in bed Responds appropriately today Not noticed to have mumbling of words today Regular work of breathing S1-S2 No edema Abdomen not distended.           Vital Signs: BP (!) 153/58 (BP Location: Right Arm)   Pulse 67   Temp 98.4 F (36.9 C) (Oral)   Resp 17   Ht 5\' 2"  (1.575 m)   Wt 57.8 kg   SpO2 91%   BMI 23.31 kg/m  SpO2: SpO2: 91 % O2 Device: O2 Device: Nasal Cannula O2 Flow Rate: O2 Flow Rate (L/min): 3  L/min  Intake/output summary:  Intake/Output Summary (Last 24 hours) at 12/13/2020 1156 Last data filed at 12/13/2020 1045 Gross per 24 hour  Intake 480 ml  Output 915 ml  Net -435 ml    LBM: Last BM Date: 12/12/20 Baseline Weight: Weight: 59 kg Most recent weight: Weight: 57.8 kg       Palliative Assessment/Data: PPS: 40%      Patient Active Problem List   Diagnosis Date Noted   Bacteremia    Palliative care by specialist  Goals of care, counseling/discussion    General weakness    Sepsis ([place]) 12/03/2020   COVID-19 virus infection 12/03/2020   Acute lower UTI 12/03/2020   Cellulitis 12/03/2020   Chronic diastolic CHF (congestive heart failure) ([place]) 12/03/2020   Hypoalbuminemia 12/03/2020   AKI (acute kidney injury) ([place]) 12/03/2020   Pleural effusion on left 09/12/2020   COPD (chronic obstructive pulmonary disease) ([place]) 09/12/2020   Sick sinus syndrome ([place]) 09/12/2020   Hypoxia 09/12/2020    Palliative Care Assessment & Plan    Assessment/Recommendations/Plan  Agree with DNR Continue current plan for now.  Family meeting: Broad goals of care discussions were held with the patient's   daughter  present at the bedside.     SNF rehab with palliative. Consider addition of hospice services once rehab trial is completed, if appropriate at that time.     Code Status: DNR  Prognosis:  Guarded, patient has high likelihood of ongoing decline.  Discharge Planning: SNF rehab with palliative.   Care plan was discussed with patient's daughter, Dr Cruzita Lederer,   Thank you for allowing the Palliative Medicine Team to assist in the care of this patient.  Total time: 25 Prolonged billing: no     Greater than 50%  of this time was spent counseling and coordinating care related to the above assessment and plan.  Loistine Chance MD Palliative Medicine   Please contact Palliative Medicine Team phone at [phone] for questions and concerns from 7 AM to 7  PM. After 7  PM, please call primary service.

## 2020-12-13 NOTE — Discharge Instructions (Signed)
Information on my medicine - ELIQUIS (apixaban)  This medication education was reviewed with me or my healthcare representative as part of my discharge preparation.    Why was Eliquis prescribed for you? Eliquis was prescribed to treat blood clots that may have been found in the veins of your legs (deep vein thrombosis) or in your lungs (pulmonary embolism) and to reduce the risk of them occurring again.  What do You need to know about Eliquis ? The dose is ONE 5 mg tablet taken TWICE daily.  Eliquis may be taken with or without food.   Try to take the dose about the same time in the morning and in the evening. If you have difficulty swallowing the tablet whole please discuss with your pharmacist how to take the medication safely.  Take Eliquis exactly as prescribed and DO NOT stop taking Eliquis without talking to the doctor who prescribed the medication.  Stopping may increase your risk of developing a new blood clot.  Refill your prescription before you run out.  After discharge, you should have regular check-up appointments with your healthcare provider that is prescribing your Eliquis.    What do you do if you miss a dose? If a dose of ELIQUIS is not taken at the scheduled time, take it as soon as possible on the same day and twice-daily administration should be resumed. The dose should not be doubled to make up for a missed dose.  Important Safety Information A possible side effect of Eliquis is bleeding. You should call your healthcare provider right away if you experience any of the following: Bleeding from an injury or your nose that does not stop. Unusual colored urine (red or dark brown) or unusual colored stools (red or black). Unusual bruising for unknown reasons. A serious fall or if you hit your head (even if there is no bleeding).  Some medicines may interact with Eliquis and might increase your risk of bleeding or clotting while on Eliquis. To help avoid this,  consult your healthcare provider or pharmacist prior to using any new prescription or non-prescription medications, including herbals, vitamins, non-steroidal anti-inflammatory drugs (NSAIDs) and supplements.  This website has more information on Eliquis (apixaban): http://www.eliquis.com/eliquis/home

## 2020-12-13 NOTE — Progress Notes (Addendum)
ANTICOAGULATION CONSULT NOTE - Initial Consult  Pharmacy Consult for enoxaparin Indication: acute DVT  Allergies  Allergen Reactions   Amoxicillin Other (See Comments)    Listed on MAR   Doxycycline Other (See Comments)    Listed on MAR   Levofloxacin Other (See Comments)    Listed on MAR   Morphine And Related Other (See Comments)    Listed on [facility]    Patient Measurements: Height: 5\' 2"  (157.5 cm) Weight: 57.8 kg (127 lb 6.8 oz) IBW/kg (Calculated) : 50.1  Vital Signs: Temp: 97.6 F (36.4 C) (09/29 0450) Temp Source: Oral (09/29 0450) BP: 151/52 (09/29 0450) Pulse Rate: 61 (09/29 0450)  Labs: Recent Labs    12/11/20 0416 12/12/20 0409 12/13/20 0342  HGB 10.3* 10.3* 10.8*  HCT 32.0* 32.1* 33.9*  PLT 336 313 329  CREATININE 0.62 0.64 0.64     Estimated Creatinine Clearance: 34 mL/min (by C-G formula based on SCr of 0.64 mg/dL).   Medical History: History reviewed. No pertinent past medical history.  Medications:  Scheduled:   albuterol  2 puff Inhalation BID   digoxin  0.125 mg Oral Daily   enoxaparin (LOVENOX) injection  1 mg/kg Subcutaneous Q12H   feeding supplement  237 mL Oral BID BM   flecainide  75 mg Oral BID   fluticasone furoate-vilanterol  1 puff Inhalation Daily   And   umeclidinium bromide  1 puff Inhalation Daily   metoprolol tartrate  12.5 mg Oral BID   multivitamin with minerals  1 tablet Oral Daily   sodium chloride flush  3 mL Intravenous Q12H   tamsulosin  0.4 mg Oral QPC breakfast   Infusions:   sodium chloride      Assessment: 87 yo female presenting with hypotension, general weakness, low BP, and bilateral lower extremity edema.  No anticoagulation noted PTA. Found to have acute DVT of the R common femoral vein and SF junction.  Pharmacy consulted to dose enoxaparin for DVT treatment.  Enoxaparin was chosen over heparin to limit the amount of extra fluid patient is receiving due to edema.   Today, 12/13/2020: - cbc stable - no  bleeding documented - scr  stable at 0.64 (crcl~44)   Goal of Therapy:  Anti-Xa level 0.6-1 units/ml 4hrs after LMWH dose given as necessary Monitor platelets by anticoagulation protocol: Yes   Plan:  - Continue enoxaparin 1 mg/kg (60 mg) subQ q12h - with table renal function, will sign off for lovenox but will follow patient peripherally along with you. Re-consult [place] if need further assistance  Lynelle Doctor, PharmD 12/13/20 8:31 AM ______________________________ Adden: Pharmacy has been consulted to transition patient to Eliquis. - d/c lovenox  - start 5 mg bid at 9p tonight since last dose of lovenox was given to pt at 9:30a. Will not do the 10 mg bid x7 days since patient has been on therapeutic LMWH dose for >7 days - pharmacy will sign off for Eliquis.  Dia Sitter, PharmD, BCPS 12/13/2020 1:29 PM

## 2020-12-13 NOTE — Progress Notes (Signed)
PROGRESS NOTE  Samantha Fitzgerald VPX:[id number] DOB: 03-22-1924 DOA: 12/03/2020 PCP: System, Provider Not In   LOS: 10 days   Brief Narrative / Interim history: 87 year old female with COPD on 2 L at baseline, sick sinus syndrome, history of SVT, diastolic CHF with recurrent large left-sided pleural effusion comes into the hospital with hypotension, weakness and shortness of breath.  She was found to have severe sepsis with Proteus UTI, left lower extremity cellulitis and volume overload with pleural effusion.  She was placed on antibiotics and underwent a thoracentesis.  She had an episode of SVT on 9/24 that terminated with metoprolol IV.  Subjective / 24h Interval events: Remains mildly confused but pleasant, no complaints  Assessment & Plan: Principal Problem Severe sepsis secondary to polymicrobial UTI, POA Questionable MDR staph epidermidis bacteremia Left lower extremity cellulitis -she received cefepime on 9/19, ceftriaxone 9/20 and then resumed back on cefepime on 9/21, last dose was 9/27, currently status post 7 days.  Urine cultures grew Proteus as well as Enterococcus -Initial blood cultures were positive for staph epidermidis, 3/4 bottles.  She is status post 8 days of vancomycin -2D echo without vegetations. -ID consulted and followed patient, no further antibiotics needed at this point  Active Problems Acute metabolic encephalopathy, improving -Likely multifactorial in setting of infection, sundowning/hospital delirium, polypharmacy. -Her mental status appears to fluctuate   Acute on chronic hypoxic respiratory failure, improving -Likely related to symptomatic pleural effusions (R > L) -Left Thoracentesis 12/05/2020 with 800cc red-tinged w/protein <3, cyto pending, with negative gram stain. Thora last done 6/30 with 850cc removed. -Continue to titrate oxygen down to baseline 2 L nasal cannula    Acute on chronic diastolic CHF -IVF discontinued -Resume furosemide     Acute DVT of the right common femoral vein and SF junction:  -Continue lovenox 1mg /kg q12h -further discussion with family and patient about risks of full dose anticoagulation in the setting of a patient with high fall risk and ambulatory dysfunction -Benefits of anticoagulation in the hospital and at SNF while monitored certainly outweigh the risks, given patient's independent living situation however this may need to be readdressed after rehab if she is a high fall risk.  Convert to Eliquis   SVT, transient, resolved:  -Converted to NSR after recurrent digoxin dose - continue digoxin (level 1.4) and flecainide.  -Metoprolol was held due to hypotension initially, continue low dose -Cardiology consulted, currently seems to be tolerating metoprolol 12.5 along with digoxin   Abnormal spastic/choreiform movement, transient  -Without focal deficits but patient has occasional episode of "twitching" and "spasm" of arms and legs on occasion with intention somewhat limiting physical therapy -Discontinued Remeron, unclear if secondary to polypharmacy, recent infection or some chronic underlying issue that has surfaced in the setting of profound sepsis   COPD: Not in acute exacerbation -Continue inhaled therapies.    History of covid-19 infection -Previously treated with remdesivir and steroids.  -No pulmonary symptoms, infiltrate on CXR. CT not consistent with active infection.  -No isolation/Tx required.     Hypokalemia/Hypomagnesemia -Continue monitoring.   Scheduled Meds:  albuterol  2 puff Inhalation BID   digoxin  0.125 mg Oral Daily   enoxaparin (LOVENOX) injection  1 mg/kg Subcutaneous Q12H   feeding supplement  237 mL Oral BID BM   flecainide  75 mg Oral BID   fluticasone furoate-vilanterol  1 puff Inhalation Daily   And   umeclidinium bromide  1 puff Inhalation Daily   metoprolol tartrate  12.5 mg Oral BID  multivitamin with minerals  1 tablet Oral Daily   sodium chloride flush   3 mL Intravenous Q12H   tamsulosin  0.4 mg Oral QPC breakfast   Continuous Infusions:  sodium chloride     PRN Meds:.sodium chloride, acetaminophen **OR** acetaminophen, albuterol, sodium chloride flush  Diet Orders (From admission, onward)     Start     Ordered   12/11/20 1443  Diet regular Room service appropriate? Yes; Fluid consistency: Thin  Diet effective now       Question Answer Comment  Room service appropriate? Yes   Fluid consistency: Thin      12/11/20 1443            DVT prophylaxis: SCDs Start: 12/03/20 2057     Code Status: DNR  Family Communication: Daughter at bedside  Status is: Inpatient  Remains inpatient appropriate because:IV treatments appropriate due to intensity of illness or inability to take PO  Dispo: The patient is from: SNF              Anticipated d/c is to: SNF              Patient currently is not medically stable to d/c.   Difficult to place patient No  Level of care: Progressive  Consultants:  Cardiology ID  Procedures:  2D echo:  IMPRESSIONS  1. ? patient in rapid flutter during exam . Left ventricular ejection fraction, by estimation, is 60 to 65%. The left ventricle has normal function. The left ventricle has no regional wall motion abnormalities. Left ventricular diastolic parameters are indeterminate.   2. Right ventricular systolic function is normal. The right ventricular size is normal.   3. The mitral valve is normal in structure. No evidence of mitral valve regurgitation. No evidence of mitral stenosis.   4. The aortic valve is tricuspid. Aortic valve regurgitation is not visualized. Mild to moderate aortic valve sclerosis/calcification is present, without any evidence of aortic stenosis.   5. The inferior vena cava is normal in size with greater than 50% respiratory variability, suggesting right atrial pressure of 3 mmHg.   Doppler US RIGHT:  - Findings consistent with acute deep vein thrombosis involving the right   common femoral vein, and SF junction.  - Findings consistent with acute superficial vein thrombosis involving the  right great saphenous vein.  - Findings consistent with age indeterminate deep vein thrombosis  involving the right proximal profunda vein.   Objective: Vitals:   12/13/20 0450 12/13/20 0600 12/13/20 0930 12/13/20 1145  BP: (!) 151/52  (!) 153/58   Pulse: 61  67   Resp: 17     Temp: 97.6 F (36.4 C)  98.4 F (36.9 C)   TempSrc: Oral  Oral   SpO2: 99%  99% 91%  Weight:  57.8 kg    Height:        Intake/Output Summary (Last 24 hours) at 12/13/2020 1304 Last data filed at 12/13/2020 1045 Gross per 24 hour  Intake 480 ml  Output 915 ml  Net -435 ml    Filed Weights   12/10/20 0433 12/12/20 0431 12/13/20 0600  Weight: 59.5 kg 59.2 kg 57.8 kg    Examination:  Constitutional: NAD Eyes: No scleral icterus ENMT: mmm Neck: normal, supple Respiratory: Clear bilaterally, no wheezing, no crackles Cardiovascular: Regular, no murmurs Abdomen: Soft, NT, ND, bowel sounds positive Musculoskeletal: no clubbing / cyanosis.  Skin: No rashes seen Neurologic: No focal deficits  Data Reviewed: I have independently reviewed following labs and  imaging studies   CBC: Recent Labs  Lab 12/07/20 0341 12/08/20 0354 12/09/20 0318 12/10/20 0326 12/11/20 0416 12/12/20 0409 12/13/20 0342  WBC 13.1* 15.3* 13.1* 14.7* 12.6* 14.1* 13.3*  NEUTROABS 10.3* 12.3* 10.1*  --   --   --   --   HGB 10.8* 11.6* 11.2* 11.4* 10.3* 10.3* 10.8*  HCT 33.0* 36.4 35.8* 35.5* 32.0* 32.1* 33.9*  MCV 88.7 89.7 90.6 90.3 90.4 90.9 91.4  PLT 310 341 310 341 336 313 867    Basic Metabolic Panel: Recent Labs  Lab 12/09/20 0318 12/10/20 0326 12/11/20 0416 12/12/20 0409 12/13/20 0342  NA 138 134* 137 137 140  K 4.6 4.6 4.3 4.3 4.5  CL 90* 91* 97* 98 100  CO2 39* 35* 33* 32 33*  GLUCOSE 99 93 90 91 92  BUN 29* 25* 25* 27* 26*  CREATININE 0.69 0.68 0.62 0.64 0.64  CALCIUM 8.9 8.4* 8.3*  8.6* 8.8*  MG 2.2  --   --   --   --     Liver Function Tests: Recent Labs  Lab 12/07/20 0341 12/08/20 0354 12/13/20 0342  AST 18 28 27   ALT 12 17 21   ALKPHOS 76 80 77  BILITOT 0.4 0.6 0.4  PROT 5.1* 5.6* 5.4*  ALBUMIN 2.4* 2.4* 2.4*    Coagulation Profile: No results for input(s): INR, PROTIME in the last 168 hours. HbA1C: No results for input(s): HGBA1C in the last 72 hours. CBG: No results for input(s): GLUCAP in the last 168 hours.  Recent Results (from the past 240 hour(s))  Urine Culture     Status: Abnormal   Collection Time: 12/03/20  2:59 PM   Specimen: Urine, Clean Catch  Result Value Ref Range Status   Specimen Description   Final    URINE, CLEAN CATCH Performed at [facility], [place] [address], [place], [place] [address]    Special Requests   Final    NONE Performed at [facility], [place] [address] [place], [place] [address]    Culture (A)  Final    >=100,000 COLONIES/mL PROTEUS VULGARIS >=100,000 COLONIES/mL ENTEROCOCCUS FAECALIS    Report Status 12/07/2020 FINAL  Final   Organism ID, Bacteria PROTEUS VULGARIS (A)  Final   Organism ID, Bacteria ENTEROCOCCUS FAECALIS (A)  Final      Susceptibility   Enterococcus faecalis - MIC*    AMPICILLIN <=2 SENSITIVE Sensitive     NITROFURANTOIN <=16 SENSITIVE Sensitive     VANCOMYCIN 1 SENSITIVE Sensitive     * >=100,000 COLONIES/mL ENTEROCOCCUS FAECALIS   Proteus vulgaris - MIC*    AMPICILLIN >=32 RESISTANT Resistant     CEFAZOLIN >=64 RESISTANT Resistant     CEFEPIME <=0.12 SENSITIVE Sensitive     CIPROFLOXACIN <=0.25 SENSITIVE Sensitive     GENTAMICIN <=1 SENSITIVE Sensitive     IMIPENEM 2 SENSITIVE Sensitive     NITROFURANTOIN 128 RESISTANT Resistant     TRIMETH/SULFA <=20 SENSITIVE Sensitive     AMPICILLIN/SULBACTAM 16 INTERMEDIATE Intermediate     PIP/TAZO <=4 SENSITIVE Sensitive     * >=100,000 COLONIES/mL PROTEUS VULGARIS  MRSA Next Gen by PCR, Nasal      Status: None   Collection Time: 12/04/20  4:06 PM   Specimen: Nasal Mucosa; Nasal Swab  Result Value Ref Range Status   MRSA by PCR Next Gen NOT DETECTED NOT DETECTED Final    Comment: (NOTE) The GeneXpert MRSA Assay (FDA approved for NASAL specimens only), is one component of  a comprehensive MRSA colonization surveillance program. It is not intended to diagnose MRSA infection nor to guide or monitor treatment for MRSA infections. Test performance is not FDA approved in patients less than 20 years old. Performed at [facility], [place] [address] [place], [place] [address]   Culture, blood (routine x 2)     Status: None   Collection Time: 12/05/20  7:38 AM   Specimen: BLOOD RIGHT HAND  Result Value Ref Range Status   Specimen Description   Final    BLOOD RIGHT HAND Performed at [place] [address] [place], [place] [address]    Special Requests   Final    BOTTLES DRAWN AEROBIC ONLY Blood Culture adequate volume Performed at [place] [address] [place], [place] [address]    Culture   Final    NO GROWTH 5 DAYS Performed at [facility], [place] [address] [place], [place] [address]    Report Status 12/10/2020 FINAL  Final  Culture, blood (routine x 2)     Status: None   Collection Time: 12/05/20  7:38 AM   Specimen: BLOOD  Result Value Ref Range Status   Specimen Description   Final    BLOOD RIGHT ANTECUBITAL Performed at [place] [address] [place], [place] [address]    Special Requests   Final    BOTTLES DRAWN AEROBIC ONLY Blood Culture adequate volume Performed at [place] [address] [place], [place] [address]    Culture   Final    NO GROWTH 5 DAYS Performed at [facility], [place] [address] [place], [place] [address]    Report Status 12/10/2020 FINAL  Final  Body fluid culture w Gram Stain     Status: None   Collection Time:  12/05/20  3:27 PM   Specimen: PATH Cytology Pleural fluid  Result Value Ref Range Status   Specimen Description   Final    PLEURAL Performed at [place] [address] [place], [place] [address]    Special Requests   Final    NONE Performed at [facility], [place] [address] [place], [place] [address]    Gram Stain   Final    FEW SQUAMOUS EPITHELIAL CELLS PRESENT FEW WBC SEEN NO ORGANISMS SEEN    Culture   Final    NO GROWTH 3 DAYS Performed at [facility], [address], [place], [place] [address]    Report Status 12/09/2020 FINAL  Final      Radiology Studies: MR BRAIN WO CONTRAST  Result Date: 12/12/2020 CLINICAL DATA:  Encephalopathy EXAM: MRI HEAD WITHOUT CONTRAST TECHNIQUE: Multiplanar, multiecho pulse sequences of the brain and surrounding structures were obtained without intravenous contrast. COMPARISON:  None. FINDINGS: Brain: No acute infarct, mass effect or extra-axial collection. No acute or chronic hemorrhage. Confluent hyperintense T2-weighted white matter signal. Moderate diffuse atrophy. The midline structures are normal. Vascular: Major flow voids are preserved. Skull and upper cervical spine: Normal calvarium and skull base. Visualized upper cervical spine and soft tissues are normal. Sinuses/Orbits:No paranasal sinus fluid levels or advanced mucosal thickening. No mastoid or middle ear effusion. Normal orbits. IMPRESSION: 1. No acute intracranial abnormality. 2. Moderate diffuse atrophy and findings of chronic small vessel disease. Electronically Signed   By: Ulyses Jarred M.D.   On: 12/12/2020 21:45     Marzetta Board, MD, PhD [facility]  Between 7  am - 7 pm I am available, please contact me via Amion (for emergencies) or Securechat (non urgent messages)  Between 7 pm - 7 am I am not available, please contact night coverage MD/APP via Amion

## 2020-12-13 NOTE — Progress Notes (Signed)
Pt's daughter [name]) brought in [place] [facility] will ... copies made and given to Nurse Sec ... original's given back to [facility].

## 2020-12-14 DIAGNOSIS — R7881 Bacteremia: Secondary | ICD-10-CM | POA: Diagnosis not present

## 2020-12-14 DIAGNOSIS — I5032 Chronic diastolic (congestive) heart failure: Secondary | ICD-10-CM | POA: Diagnosis not present

## 2020-12-14 DIAGNOSIS — N179 Acute kidney failure, unspecified: Secondary | ICD-10-CM | POA: Diagnosis not present

## 2020-12-14 LAB — BASIC METABOLIC PANEL
Anion gap: 6 (ref 5–15)
BUN: 26 mg/dL — ABNORMAL HIGH (ref 8–23)
CO2: 33 mmol/L — ABNORMAL HIGH (ref 22–32)
Calcium: 8.7 mg/dL — ABNORMAL LOW (ref 8.9–10.3)
Chloride: 99 mmol/L (ref 98–111)
Creatinine, Ser: 0.68 mg/dL (ref 0.44–1.00)
GFR, Estimated: >60 mL/min (ref >60)
Glucose, Bld: 86 mg/dL (ref 70–99)
Potassium: 4.4 mmol/L (ref 3.5–5.1)
Sodium: 138 mmol/L (ref 135–145)

## 2020-12-14 LAB — CBC
HCT: 31 % — ABNORMAL LOW (ref 36.0–46.0)
Hemoglobin: 9.9 g/dL — ABNORMAL LOW (ref 12.0–15.0)
MCH: 29 pg (ref 26.0–34.0)
MCHC: 31.9 g/dL (ref 30.0–36.0)
MCV: 90.9 fL (ref 80.0–100.0)
Platelets: 332 10*3/uL (ref 150–400)
RBC: 3.41 MIL/uL — ABNORMAL LOW (ref 3.87–5.11)
RDW: 14.6 % (ref 11.5–15.5)
WBC: 12.3 10*3/uL — ABNORMAL HIGH (ref 4.0–10.5)
nRBC: 0 % (ref 0.0–0.2)

## 2020-12-14 MED ORDER — TAMSULOSIN HCL 0.4 MG PO CAPS: 0.4000 mg | ORAL_CAPSULE | Freq: Every day | ORAL | Status: DC

## 2020-12-14 MED ORDER — APIXABAN 5 MG PO TABS: 5.0000 mg | ORAL_TABLET | Freq: Two times a day (BID) | ORAL | Status: DC

## 2020-12-14 NOTE — Care Management Important Message (Signed)
Medicare IM printed for Social Work at [employer] to give to the patient

## 2020-12-14 NOTE — TOC Progression Note (Signed)
Transition of Care [facility]) - Progression Note    Patient Details  Name: Samantha Fitzgerald MRN: [id number] Date of Birth: [birth year]  Transition of Care [facility]) CM/SW Contact  Purcell Mouton, RN Phone Number: 12/14/2020, 9:20 AM  Clinical Narrative:    Corey Harold was called for pt to transport to [facility] today. RN is ware.   Expected Discharge Plan: Assisted Living Barriers to Discharge: No Barriers Identified  Expected Discharge Plan and Services Expected Discharge Plan: Assisted Living       Living arrangements for the past 2 months: [place] Expected Discharge Date: 12/14/20                                     Social Determinants of Health (SDOH) Interventions    Readmission Risk Interventions No flowsheet data found.

## 2020-12-14 NOTE — Progress Notes (Signed)
Daughter Sheilagh notified of discharge to [facility].

## 2020-12-14 NOTE — Plan of Care (Signed)
Patient to discharge to [facility]  Problem: Education: Goal: Knowledge of General Education information will improve Description: Including pain rating scale, medication(s)/side effects and non-pharmacologic comfort measures Outcome: Adequate for Discharge   Problem: Health Behavior/Discharge Planning: Goal: Ability to manage health-related needs will improve Outcome: Adequate for Discharge   Problem: Clinical Measurements: Goal: Ability to maintain clinical measurements within normal limits will improve Outcome: Adequate for Discharge Goal: Will remain free from infection Outcome: Adequate for Discharge Goal: Diagnostic test results will improve Outcome: Adequate for Discharge Goal: Respiratory complications will improve Outcome: Adequate for Discharge Goal: Cardiovascular complication will be avoided Outcome: Adequate for Discharge   Problem: Activity: Goal: Risk for activity intolerance will decrease Outcome: Adequate for Discharge   Problem: Nutrition: Goal: Adequate nutrition will be maintained Outcome: Adequate for Discharge   Problem: Coping: Goal: Level of anxiety will decrease Outcome: Adequate for Discharge   Problem: Elimination: Goal: Will not experience complications related to bowel motility Outcome: Adequate for Discharge Goal: Will not experience complications related to urinary retention Outcome: Adequate for Discharge   Problem: Pain Managment: Goal: General experience of comfort will improve Outcome: Adequate for Discharge   Problem: Safety: Goal: Ability to remain free from injury will improve Outcome: Adequate for Discharge   Problem: Skin Integrity: Goal: Risk for impaired skin integrity will decrease Outcome: Adequate for Discharge

## 2020-12-14 NOTE — Discharge Summary (Addendum)
Physician Discharge Summary  Samantha Fitzgerald GHW:[id number] DOB: June 14, 1924 DOA: 12/03/2020  PCP: System, Provider Not In  Admit date: 12/03/2020 Discharge date: 12/14/2020  Admitted From: ALF Disposition:  SNF  Recommendations for Outpatient Follow-up:  Follow up with PCP in 1-2 weeks Please obtain BMP/CBC in one week Recommend palliative follow up   [place]: none Equipment/Devices: none  Discharge Condition: stable CODE STATUS: DNR Diet recommendation: regular  HPI: Per admitting MD, Samantha Fitzgerald is a 87 y.o. female with medical history significant of   COPD 2 L nasal cannula, sick sinus syndrome, CHF large left-sided pleural effusion, Covid positive September 12 2020 Presented with   generalized weakness started today. Hypotensive 80/60s  but pitting edema bilateraly patient herself unable to provide history.  Oral membranes appear to be dry Hx of Pleural Effusion: Underwent thoracentesis 6/30, 850 cc removed Pleural fluid studies-negative. Re COVID was treated with steroids and remdesivir.  Complete her course in the hospital, slowly she was weaned down to 2 L nasal cannula She had a recent flu shot 4 days [facility] Course / Discharge diagnoses: Principal Problem Severe sepsis secondary to polymicrobial UTI, POA Questionable MDR staph epidermidis bacteremia Left lower extremity cellulitis -she received cefepime on 9/19, ceftriaxone 9/20 and then resumed back on cefepime on 9/21, last dose was 9/27, currently status post 7 days.  Urine cultures grew Proteus as well as Enterococcus. Initial blood cultures were positive for staph epidermidis, 3/4 bottles.  She is status post 8 days of vancomycin. 2D echo without vegetations. ID consulted and followed patient, no further antibiotics needed at this point   Active Problems Acute metabolic encephalopathy, improving -Likely multifactorial in setting of infection, sundowning/hospital delirium, polypharmacy. Acute on  chronic hypoxic respiratory failure, improving-Likely related to symptomatic pleural effusions (R > L). Left Thoracentesis 12/05/2020 with 800cc red-tinged w/protein <3, cyto pending, with negative gram stain. Thora last done 6/30 with 850cc removed. Continue to titrate oxygen down to baseline 2 L nasal cannula Acute on chronic diastolic CHF -Resume furosemide  Acute DVT of the right common femoral vein and SF junction:  -discussion with family and patient about risks of full dose anticoagulation in the setting of a patient with high fall risk and ambulatory dysfunction. Benefits of anticoagulation in the hospital and at SNF while monitored certainly outweigh the risks, given patient's independent living situation however this may need to be readdressed after rehab if she is a high fall risk.  Convert to Eliquis SVT, transient, resolved:  -Converted to NSR after recurrent digoxin dose - continue digoxin (level 1.4) and flecainide. Metoprolol was held due to hypotension initially, but now resumed. Cardiology consulted, currently seems to be tolerating metoprolol 12.5 along with digoxin Abnormal spastic/choreiform movement, transient  -Without focal deficits but patient has occasional episode of "twitching" and "spasm" of arms and legs on occasion with intention somewhat limiting physical therapy. Discontinued Remeron, unclear if secondary to polypharmacy, recent infection or some chronic underlying issue that has surfaced in the setting of profound sepsis COPD: Not in acute exacerbation -Continue inhaled therapies.  History of covid-19 infection -Previously treated with remdesivir and steroids. No pulmonary symptoms, infiltrate on CXR. CT not consistent with active infection.  Hypokalemia/Hypomagnesemia -Continue monitoring.  Urinary retention-continue Foley on discharge, give voiding trial when more mobile.  Continue tamsulosin for now but if retention resolves can be stopped later  Sepsis ruled  out   Discharge Instructions   Allergies as of 12/14/2020  Reactions   Amoxicillin Other (See Comments)   Listed on MAR   Doxycycline Other (See Comments)   Listed on MAR   Levofloxacin Other (See Comments)   Listed on MAR   Morphine And Related Other (See Comments)   Listed on MAR        Medication List     TAKE these medications    albuterol 108 (90 Base) MCG/ACT inhaler Commonly known as: VENTOLIN HFA Inhale 2 puffs into the lungs See admin instructions. Inhale 2 puffs by mouth twice daily. May inhale 2 puffs by mouth every 4 hours as needed for wheezing.   apixaban 5 MG Tabs tablet Commonly known as: ELIQUIS Take 1 tablet (5 mg total) by mouth 2 (two) times daily.   digoxin 0.125 MG tablet Commonly known as: LANOXIN Take 0.125 mg by mouth daily. Hold and notify MD if HR less than 60   Ensure Take 237 mLs by mouth 2 (two) times daily between meals.   esomeprazole 10 MG packet Commonly known as: NEXIUM Take 10 mg by mouth every 12 (twelve) hours as needed for heartburn.   flecainide 50 MG tablet Commonly known as: TAMBOCOR Take 75 mg by mouth 2 (two) times daily.   furosemide 20 MG tablet Commonly known as: LASIX Take 20 mg by mouth daily.   levalbuterol 0.63 MG/3ML nebulizer solution Commonly known as: XOPENEX Take 3 mLs by nebulization every 6 (six) hours as needed for wheezing.   metoprolol tartrate 25 MG tablet Commonly known as: LOPRESSOR Take 25 mg by mouth 2 (two) times daily.   multivitamin with minerals tablet Take 1 tablet by mouth daily.   OXYGEN Inhale 2 L into the lungs continuous.   tamsulosin 0.4 MG Caps capsule Commonly known as: FLOMAX Take 1 capsule (0.4 mg total) by mouth daily after breakfast.   Trelegy Ellipta 100-62.5-25 MCG/INH Aepb Generic drug: Fluticasone-Umeclidin-Vilant Take 1 puff by mouth daily.         Consultations: Cardiology   Procedures/Studies:  DG Chest 1 View  Result Date:  12/05/2020 CLINICAL DATA:  Post thoracentesis. EXAM: CHEST  1 VIEW COMPARISON:  Chest x-ray 12/03/2020. FINDINGS: There is a small left pleural effusion which is decreased from the prior examination. There are findings suspicious for tiny left apical pneumothorax. There is no mediastinal shift. Small right pleural effusion is unchanged from the prior examination. The cardiomediastinal silhouette is stable. Surgical changes overlie the right hilar region. There are atherosclerotic calcifications of the aorta. Osseous structures are within normal limits. IMPRESSION: 1. Tiny left apical pneumothorax. 2. Small left pleural effusion has decreased. 3. Small right pleural effusion is unchanged. 4.  Aortic Atherosclerosis (ICD10-I70.0). Electronically Signed   By: Ronney Asters M.D.   On: 12/05/2020 15:48   DG Chest 2 View  Result Date: 12/03/2020 CLINICAL DATA:  Weakness, hypotensive, pitting in legs EXAM: CHEST - 2 VIEW COMPARISON:  Chest radiograph 09/15/2020 FINDINGS: The heart is mildly enlarged, unchanged. The mediastinal contours are stable. There is a small to moderate size left pleural effusion, increased in size since 09/16/2020. There is adjacent airspace disease. The left upper lobe is well aerated. There is no focal consolidation on the right. There is no significant right pleural effusion. There is no pneumothorax. There is no acute osseous abnormality. IMPRESSION: Since 09/15/2020, there has been interval increase in size of the small to moderate size left pleural effusion with worsening aeration at the left base. Electronically Signed   By: Court Joy.D.  On: 12/03/2020 12:04   MR BRAIN WO CONTRAST  Result Date: 12/12/2020 CLINICAL DATA:  Encephalopathy EXAM: MRI HEAD WITHOUT CONTRAST TECHNIQUE: Multiplanar, multiecho pulse sequences of the brain and surrounding structures were obtained without intravenous contrast. COMPARISON:  None. FINDINGS: Brain: No acute infarct, mass effect or extra-axial  collection. No acute or chronic hemorrhage. Confluent hyperintense T2-weighted white matter signal. Moderate diffuse atrophy. The midline structures are normal. Vascular: Major flow voids are preserved. Skull and upper cervical spine: Normal calvarium and skull base. Visualized upper cervical spine and soft tissues are normal. Sinuses/Orbits:No paranasal sinus fluid levels or advanced mucosal thickening. No mastoid or middle ear effusion. Normal orbits. IMPRESSION: 1. No acute intracranial abnormality. 2. Moderate diffuse atrophy and findings of chronic small vessel disease. Electronically Signed   By: Ulyses Jarred M.D.   On: 12/12/2020 21:45   DG CHEST PORT 1 VIEW  Result Date: 12/03/2020 CLINICAL DATA:  Dyspnea EXAM: PORTABLE CHEST 1 VIEW COMPARISON:  12/03/2020 FINDINGS: Cardiac enlargement. Bilateral pleural effusions, greater on the left, with left greater than right perihilar infiltration. This could represent edema or pneumonia. Surgical clips in the right hilum with hilar prominence, possibly lymphadenopathy. Old right rib fractures. Degenerative changes in the spine and shoulders. Calcification of the aorta. IMPRESSION: Bilateral pleural effusions and perihilar infiltrates, greater on the left. Postoperative changes and nodular prominence in the left hilum. Electronically Signed   By: Lucienne Capers M.D.   On: 12/03/2020 21:59   ECHOCARDIOGRAM COMPLETE  Result Date: 12/08/2020    ECHOCARDIOGRAM REPORT   Patient Name:   TERRILEE DUDZIK Friddle Date of Exam: 12/08/2020 Medical Rec #:  [id number]              Height:       62.0 in Accession #:    [id number]             Weight:       126.8 lb Date of Birth:  07/06/1924               BSA:          1.575 m Patient Age:    36 years               BP:           97/57 mmHg Patient Gender: F                      HR:           120 bpm. Exam Location:  Inpatient Procedure: 2D Echo, Color Doppler and Cardiac Doppler Indications:    W10.27 Acute diastolic  (congestive) heart failure  History:        Patient has prior history of Echocardiogram examinations, most                 recent 09/14/2020. CHF; COPD. Recent COVID Infection.  Sonographer:    Raquel Sarna Senior RDCS Referring Phys: [id number] Patrecia Pour  Sonographer Comments: Technically difficult due to COPD IMPRESSIONS  1. ? patient in rapid flutter during exam . Left ventricular ejection fraction, by estimation, is 60 to 65%. The left ventricle has normal function. The left ventricle has no regional wall motion abnormalities. Left ventricular diastolic parameters are indeterminate.  2. Right ventricular systolic function is normal. The right ventricular size is normal.  3. The mitral valve is normal in structure. No evidence of mitral valve regurgitation. No evidence of mitral stenosis.  4. The aortic valve is tricuspid.  Aortic valve regurgitation is not visualized. Mild to moderate aortic valve sclerosis/calcification is present, without any evidence of aortic stenosis.  5. The inferior vena cava is normal in size with greater than 50% respiratory variability, suggesting right atrial pressure of 3 mmHg. FINDINGS  Left Ventricle: ? patient in rapid flutter during exam. Left ventricular ejection fraction, by estimation, is 60 to 65%. The left ventricle has normal function. The left ventricle has no regional wall motion abnormalities. The left ventricular internal cavity size was normal in size. There is no left ventricular hypertrophy. Left ventricular diastolic parameters are indeterminate. Right Ventricle: The right ventricular size is normal. No increase in right ventricular wall thickness. Right ventricular systolic function is normal. Left Atrium: Left atrial size was normal in size. Right Atrium: Right atrial size was normal in size. Pericardium: There is no evidence of pericardial effusion. Mitral Valve: The mitral valve is normal in structure. No evidence of mitral valve regurgitation. No evidence of mitral valve  stenosis. Tricuspid Valve: The tricuspid valve is normal in structure. Tricuspid valve regurgitation is trivial. No evidence of tricuspid stenosis. Aortic Valve: The aortic valve is tricuspid. Aortic valve regurgitation is not visualized. Mild to moderate aortic valve sclerosis/calcification is present, without any evidence of aortic stenosis. Pulmonic Valve: The pulmonic valve was normal in structure. Pulmonic valve regurgitation is not visualized. No evidence of pulmonic stenosis. Aorta: The aortic root is normal in size and structure. Venous: The inferior vena cava is normal in size with greater than 50% respiratory variability, suggesting right atrial pressure of 3 mmHg. IAS/Shunts: The interatrial septum was not well visualized.  LEFT VENTRICLE PLAX 2D LVIDd:         2.90 cm LVIDs:         1.90 cm LV PW:         0.90 cm LV IVS:        1.00 cm LVOT diam:     1.80 cm LV SV:         34 LV SV Index:   22 LVOT Area:     2.54 cm  RIGHT VENTRICLE RV S prime:     7.83 cm/s TAPSE (M-mode): 1.0 cm LEFT ATRIUM           Index       RIGHT ATRIUM           Index LA diam:      3.20 cm 2.03 cm/m  RA Area:     10.90 cm LA Vol (A2C): 17.8 ml 11.30 ml/m RA Volume:   21.00 ml  13.33 ml/m LA Vol (A4C): 44.3 ml 28.13 ml/m  AORTIC VALVE LVOT Vmax:   69.80 cm/s LVOT Vmean:  56.700 cm/s LVOT VTI:    0.135 m  AORTA Ao Root diam: 3.20 cm  SHUNTS Systemic VTI:  0.14 m Systemic Diam: 1.80 cm Jenkins Rouge MD Electronically signed by Jenkins Rouge MD Signature Date/Time: 12/08/2020/1:13:34 PM    Final    VAS [place] LOWER EXTREMITY VENOUS (DVT)  Result Date: 12/04/2020  Lower Venous DVT Study Patient Name:  JUDA LAJEUNESSE Banning  Date of Exam:   12/04/2020 Medical Rec #: [id number]               Accession #:    [id number] Date of Birth: 1924-12-19                Patient Gender: F Patient Age:   29 years Exam Location:  [facility] Procedure:  VAS [place] LOWER EXTREMITY VENOUS (DVT) Referring Phys: ANASTASSIA DOUTOVA  --------------------------------------------------------------------------------  Indications: Edema. Other Indications: Covid+. Limitations: Poor ultrasound/tissue interface and edema, patient immobility. Comparison Study: No previous exams Performing Technologist: Jody Hill RVT, RDMS  Examination Guidelines: A complete evaluation includes B-mode imaging, spectral Doppler, color Doppler, and power Doppler as needed of all accessible portions of each vessel. Bilateral testing is considered an integral part of a complete examination. Limited examinations for reoccurring indications may be performed as noted. The reflux portion of the exam is performed with the patient in reverse Trendelenburg.  +---------+---------------+---------+-----------+----------+-------------------+ RIGHT    CompressibilityPhasicitySpontaneityPropertiesThrombus Aging      +---------+---------------+---------+-----------+----------+-------------------+ CFV      Partial        Yes      Yes                  Acute               +---------+---------------+---------+-----------+----------+-------------------+ SFJ      Partial                                      Acute               +---------+---------------+---------+-----------+----------+-------------------+ FV Prox  Full           Yes      Yes                                      +---------+---------------+---------+-----------+----------+-------------------+ FV Mid   Full           Yes      Yes                                      +---------+---------------+---------+-----------+----------+-------------------+ FV DistalFull           Yes      Yes                                      +---------+---------------+---------+-----------+----------+-------------------+ PFV      Partial        Yes      Yes                  Age Indeterminate   +---------+---------------+---------+-----------+----------+-------------------+ POP      Full           Yes       Yes                                      +---------+---------------+---------+-----------+----------+-------------------+ PTV      Full                                         Not well visualized +---------+---------------+---------+-----------+----------+-------------------+ PERO     Full  Not well visualized +---------+---------------+---------+-----------+----------+-------------------+ GSV      Partial        Yes      Yes                  GSV origin -                                                              partial acute       +---------+---------------+---------+-----------+----------+-------------------+   +---------+---------------+---------+-----------+----------+--------------+ LEFT     CompressibilityPhasicitySpontaneityPropertiesThrombus Aging +---------+---------------+---------+-----------+----------+--------------+ CFV      Full           Yes      Yes                                 +---------+---------------+---------+-----------+----------+--------------+ SFJ      Full                                                        +---------+---------------+---------+-----------+----------+--------------+ FV Prox  Full           Yes      Yes                                 +---------+---------------+---------+-----------+----------+--------------+ FV Mid   Full           Yes      Yes                                 +---------+---------------+---------+-----------+----------+--------------+ FV DistalFull           Yes      Yes                                 +---------+---------------+---------+-----------+----------+--------------+ PFV      Full                                                        +---------+---------------+---------+-----------+----------+--------------+ POP      Full           Yes      Yes                                  +---------+---------------+---------+-----------+----------+--------------+ PTV      Full                                                        +---------+---------------+---------+-----------+----------+--------------+ PERO  Not visualized +---------+---------------+---------+-----------+----------+--------------+   Left Technical Findings: Not visualized segments include peroneal veins.   Summary: BILATERAL: -No evidence of popliteal cyst, bilaterally. RIGHT: - Findings consistent with acute deep vein thrombosis involving the right common femoral vein, and SF junction. - Findings consistent with acute superficial vein thrombosis involving the right great saphenous vein. - Findings consistent with age indeterminate deep vein thrombosis involving the right proximal profunda vein. - Common femoral vein obstruction doesn't appear to extend above inguinal ligament.  LEFT: - Portions of this examination were limited- see technologist comments above. - There is no evidence of deep vein thrombosis in the lower extremity. - There is no evidence of superficial venous thrombosis.  *See table(s) above for measurements and observations. Electronically signed by Deitra Mayo MD on 12/04/2020 at 4:01:08 PM.    Final    US THORACENTESIS ASP PLEURAL SPACE W/IMG GUIDE  Result Date: 12/05/2020 INDICATION: Pleural effusion. EXAM: ULTRASOUND GUIDED DIAGNOSITIC AND THERAPEUTIC THORACENTESIS MEDICATIONS: None. COMPLICATIONS: None immediate. PROCEDURE: An ultrasound guided thoracentesis was thoroughly discussed with the patient and questions answered. The benefits, risks, alternatives and complications were also discussed. The patient understands and wishes to proceed with the procedure. Written consent was obtained. Ultrasound was performed to localize and mark an adequate pocket of fluid in the left chest. The area was then prepped and draped in the normal sterile  fashion. 1% Lidocaine was used for local anesthesia. Under ultrasound guidance a 6 Fr Safe-T-Centesis catheter was introduced. Thoracentesis was performed. The catheter was removed and a dressing applied. FINDINGS: A total of approximately 800 of red-tinged fluid was removed. Samples were sent to the laboratory as requested by the clinical team. IMPRESSION: Successful ultrasound guided diagnostic and therapeutic thoracentesis yielding 800cc of pleural fluid. Electronically Signed   By: Ruthann Cancer M.D.   On: 12/05/2020 15:32     Subjective: - no chest pain, shortness of breath, no abdominal pain, nausea or vomiting.    Discharge Exam: BP (!) 163/62 (BP Location: Left Arm)   Pulse 65   Temp 97.8 F (36.6 C) (Axillary)   Resp 18   Ht 5\' 2"  (1.575 m)   Wt 57.8 kg   SpO2 92%   BMI 23.31 kg/m   General: Pt is alert, awake, not in acute distress Cardiovascular: RRR, S1/S2 +, no rubs, no gallops Respiratory: CTA bilaterally, no wheezing, no rhonchi Abdominal: Soft, NT, ND, bowel sounds + Extremities: no edema, no cyanosis    The results of significant diagnostics from this hospitalization (including imaging, microbiology, ancillary and laboratory) are listed below for reference.     Microbiology: Recent Results (from the past 240 hour(s))  MRSA Next Gen by PCR, Nasal     Status: None   Collection Time: 12/04/20  4:06 PM   Specimen: Nasal Mucosa; Nasal Swab  Result Value Ref Range Status   MRSA by PCR Next Gen NOT DETECTED NOT DETECTED Final    Comment: (NOTE) The GeneXpert MRSA Assay (FDA approved for NASAL specimens only), is one component of a comprehensive MRSA colonization surveillance program. It is not intended to diagnose MRSA infection nor to guide or monitor treatment for MRSA infections. Test performance is not FDA approved in patients less than 3 years old. Performed at [facility], [place] [address], [place], [place] [address]   Culture, blood  (routine x 2)     Status: None   Collection Time: 12/05/20  7:38 AM   Specimen: BLOOD RIGHT HAND  Result Value Ref Range  Status   Specimen Description   Final    BLOOD RIGHT HAND Performed at [place] [address] [place], [place] [address]    Special Requests   Final    BOTTLES DRAWN AEROBIC ONLY Blood Culture adequate volume Performed at [place] [address] [place], [place] [address]    Culture   Final    NO GROWTH 5 DAYS Performed at [facility], [place] [address], [place], [place] [address]    Report Status 12/10/2020 FINAL  Final  Culture, blood (routine x 2)     Status: None   Collection Time: 12/05/20  7:38 AM   Specimen: BLOOD  Result Value Ref Range Status   Specimen Description   Final    BLOOD RIGHT ANTECUBITAL Performed at [place] [address] [place], [place] [address]    Special Requests   Final    BOTTLES DRAWN AEROBIC ONLY Blood Culture adequate volume Performed at [place] [facility] [address], [place], [place] [address]    Culture   Final    NO GROWTH 5 DAYS Performed at [facility], [place] [address], [place], [place] [address]    Report Status 12/10/2020 FINAL  Final  Body fluid culture w Gram Stain     Status: None   Collection Time: 12/05/20  3:27 PM   Specimen: PATH Cytology Pleural fluid  Result Value Ref Range Status   Specimen Description   Final    PLEURAL Performed at [place] [address], [place], [place] [address]    Special Requests   Final    NONE Performed at [facility], [place] [address], [place], [place] [address]    Gram Stain   Final    FEW SQUAMOUS EPITHELIAL CELLS PRESENT FEW WBC SEEN NO ORGANISMS SEEN    Culture   Final    NO GROWTH 3 DAYS Performed at [facility], [address] [place],  [address]    Report Status 12/09/2020 FINAL  Final      Labs: Basic Metabolic Panel: Recent Labs  Lab 12/09/20 0318 12/10/20 0326 12/11/20 0416 12/12/20 0409 12/13/20 0342 12/14/20 0348  NA 138 134* 137 137 140 138  K 4.6 4.6 4.3 4.3 4.5 4.4  CL 90* 91* 97* 98 100 99  CO2 39* 35* 33* 32 33* 33*  GLUCOSE 99 93 90 91 92 86  BUN 29* 25* 25* 27* 26* 26*  CREATININE 0.69 0.68 0.62 0.64 0.64 0.68  CALCIUM 8.9 8.4* 8.3* 8.6* 8.8* 8.7*  MG 2.2  --   --   --   --   --    Liver Function Tests: Recent Labs  Lab 12/08/20 0354 12/13/20 0342  AST 28 27  ALT 17 21  ALKPHOS 80 77  BILITOT 0.6 0.4  PROT 5.6* 5.4*  ALBUMIN 2.4* 2.4*   CBC: Recent Labs  Lab 12/08/20 0354 12/09/20 0318 12/10/20 0326 12/11/20 0416 12/12/20 0409 12/13/20 0342 12/14/20 0348  WBC 15.3* 13.1* 14.7* 12.6* 14.1* 13.3* 12.3*  NEUTROABS 12.3* 10.1*  --   --   --   --   --   HGB 11.6* 11.2* 11.4* 10.3* 10.3* 10.8* 9.9*  HCT 36.4 35.8* 35.5* 32.0* 32.1* 33.9* 31.0*  MCV 89.7 90.6 90.3 90.4 90.9 91.4 90.9  PLT 341 310 341 336 313 329 332   CBG: No results for input(s): GLUCAP in the last  168 hours. Hgb A1c No results for input(s): HGBA1C in the last 72 hours. Lipid Profile No results for input(s): CHOL, HDL, LDLCALC, TRIG, CHOLHDL, LDLDIRECT in the last 72 hours. Thyroid function studies No results for input(s): TSH, T4TOTAL, T3FREE, THYROIDAB in the last 72 hours.  Invalid input(s): FREET3 Urinalysis    Component Value Date/Time   COLORURINE STRAW (A) 12/03/2020 1458   APPEARANCEUR TURBID (A) 12/03/2020 1458   LABSPEC  12/03/2020 1458    TEST NOT REPORTED DUE TO COLOR INTERFERENCE OF URINE PIGMENT   PHURINE  12/03/2020 1458    TEST NOT REPORTED DUE TO COLOR INTERFERENCE OF URINE PIGMENT   GLUCOSEU (A) 12/03/2020 1458    TEST NOT REPORTED DUE TO COLOR INTERFERENCE OF URINE PIGMENT   HGBUR (A) 12/03/2020 1458    TEST NOT REPORTED DUE TO COLOR INTERFERENCE OF URINE PIGMENT   BILIRUBINUR (A) 12/03/2020 1458    TEST NOT REPORTED DUE TO COLOR  INTERFERENCE OF URINE PIGMENT   KETONESUR (A) 12/03/2020 1458    TEST NOT REPORTED DUE TO COLOR INTERFERENCE OF URINE PIGMENT   PROTEINUR (A) 12/03/2020 1458    TEST NOT REPORTED DUE TO COLOR INTERFERENCE OF URINE PIGMENT   NITRITE (A) 12/03/2020 1458    TEST NOT REPORTED DUE TO COLOR INTERFERENCE OF URINE PIGMENT   LEUKOCYTESUR (A) 12/03/2020 1458    TEST NOT REPORTED DUE TO COLOR INTERFERENCE OF URINE PIGMENT    FURTHER DISCHARGE INSTRUCTIONS:   Get Medicines reviewed and adjusted: Please take all your medications with you for your next visit with your Primary MD   Laboratory/radiological data: Please request your Primary MD to go over all hospital tests and procedure/radiological results at the follow up, please ask your Primary MD to get all Hospital records sent to his/her office.   In some cases, they will be blood work, cultures and biopsy results pending at the time of your discharge. Please request that your primary care M.D. goes through all the records of your hospital data and follows up on these results.   Also Note the following: If you experience worsening of your admission symptoms, develop shortness of breath, life threatening emergency, suicidal or homicidal thoughts you must seek medical attention immediately by calling 911 or calling your MD immediately  if symptoms less severe.   You must read complete instructions/literature along with all the possible adverse reactions/side effects for all the Medicines you take and that have been prescribed to you. Take any new Medicines after you have completely understood and accpet all the possible adverse reactions/side effects.    Do not drive when taking Pain medications or sleeping medications (Benzodaizepines)   Do not take more than prescribed Pain, Sleep and Anxiety Medications. It is not advisable to combine anxiety,sleep and pain medications without talking with your primary care practitioner   Special Instructions:  If you have smoked or chewed Tobacco  in the last 2 yrs please stop smoking, stop any regular Alcohol  and or any Recreational drug use.   Wear Seat belts while driving.   Please note: You were cared for by a hospitalist during your hospital stay. Once you are discharged, your primary care physician will handle any further medical issues. Please note that NO REFILLS for any discharge medications will be authorized once you are discharged, as it is imperative that you return to your primary care physician (or establish a relationship with a primary care physician if you do not have one) for your post hospital discharge  needs so that they can reassess your need for medications and monitor your lab values.  Time coordinating discharge: 35 minutes  SIGNED:  Marzetta Board, MD, PhD 12/14/2020, 8:00 AM  35

## 2020-12-15 LAB — FLECAINIDE LEVEL: Flecainide: 0.56 ug/mL (ref 0.20–1.00)

## 2021-01-29 ENCOUNTER — Other Ambulatory Visit: Payer: Self-pay

## 2021-01-29 ENCOUNTER — Encounter (HOSPITAL_COMMUNITY): Payer: Self-pay

## 2021-01-29 ENCOUNTER — Emergency Department (HOSPITAL_COMMUNITY)
Admission: EM | Admit: 2021-01-29 | Discharge: 2021-01-29 | Disposition: A | Discharge status: Home / Self Care | Payer: Medicare Other | Attending: Emergency Medicine | Admitting: Emergency Medicine

## 2021-01-29 ENCOUNTER — Emergency Department (HOSPITAL_COMMUNITY): Payer: Medicare Other

## 2021-01-29 DIAGNOSIS — R000 Tachycardia, unspecified: Secondary | ICD-10-CM | POA: Diagnosis not present

## 2021-01-29 DIAGNOSIS — Z8616 Personal history of COVID-19: Secondary | ICD-10-CM | POA: Insufficient documentation

## 2021-01-29 DIAGNOSIS — J449 Chronic obstructive pulmonary disease, unspecified: Secondary | ICD-10-CM | POA: Insufficient documentation

## 2021-01-29 DIAGNOSIS — I5032 Chronic diastolic (congestive) heart failure: Secondary | ICD-10-CM | POA: Diagnosis not present

## 2021-01-29 DIAGNOSIS — Z7901 Long term (current) use of anticoagulants: Secondary | ICD-10-CM | POA: Diagnosis not present

## 2021-01-29 DIAGNOSIS — Z87891 Personal history of nicotine dependence: Secondary | ICD-10-CM | POA: Insufficient documentation

## 2021-01-29 LAB — CBC WITH DIFFERENTIAL/PLATELET
Abs Immature Granulocytes: 0.05 10*3/uL (ref 0.00–0.07)
Basophils Absolute: 0 10*3/uL (ref 0.0–0.1)
Basophils Relative: 0 %
Eosinophils Absolute: 0.1 10*3/uL (ref 0.0–0.5)
Eosinophils Relative: 1 %
HCT: 30.6 % — ABNORMAL LOW (ref 36.0–46.0)
Hemoglobin: 9.8 g/dL — ABNORMAL LOW (ref 12.0–15.0)
Immature Granulocytes: 1 %
Lymphocytes Relative: 15 %
Lymphs Abs: 1.7 10*3/uL (ref 0.7–4.0)
MCH: 30.2 pg (ref 26.0–34.0)
MCHC: 32 g/dL (ref 30.0–36.0)
MCV: 94.2 fL (ref 80.0–100.0)
Monocytes Absolute: 0.7 10*3/uL (ref 0.1–1.0)
Monocytes Relative: 6 %
Neutro Abs: 8.3 10*3/uL — ABNORMAL HIGH (ref 1.7–7.7)
Neutrophils Relative %: 77 %
Platelets: 311 10*3/uL (ref 150–400)
RBC: 3.25 MIL/uL — ABNORMAL LOW (ref 3.87–5.11)
RDW: 14.9 % (ref 11.5–15.5)
WBC: 10.8 10*3/uL — ABNORMAL HIGH (ref 4.0–10.5)
nRBC: 0 % (ref 0.0–0.2)

## 2021-01-29 LAB — BASIC METABOLIC PANEL
Anion gap: 9 (ref 5–15)
BUN: 11 mg/dL (ref 8–23)
CO2: 31 mmol/L (ref 22–32)
Calcium: 8.2 mg/dL — ABNORMAL LOW (ref 8.9–10.3)
Chloride: 96 mmol/L — ABNORMAL LOW (ref 98–111)
Creatinine, Ser: 0.61 mg/dL (ref 0.44–1.00)
GFR, Estimated: >60 mL/min (ref >60)
Glucose, Bld: 91 mg/dL (ref 70–99)
Potassium: 4.5 mmol/L (ref 3.5–5.1)
Sodium: 136 mmol/L (ref 135–145)

## 2021-01-29 LAB — DIGOXIN LEVEL: Digoxin Level: 0.5 ng/mL — ABNORMAL LOW (ref 0.8–2.0)

## 2021-01-29 NOTE — ED Provider Notes (Signed)
[facility] EMERGENCY DEPARTMENT Provider Note   CSN: [id number] Arrival date & time: 01/29/21  3329     History No chief complaint on file.   Samantha Fitzgerald is a 87 y.o. female.  87 year old female with medical history as detailed below presents for evaluation.  Patient arrives by EMS.  Patient reports that she had an episode of "fast heartbeats."  This occurred this morning.  Patient denies associated chest pain or shortness of breath.  Patient denies feelings of syncope.  Patient reports that the sensation lasted several minutes.  Apparently the sensation of "fast heartbeats" improved with administration of Ativan.  Patient with documented history of SVT.  Patient is currently on metoprolol, digoxin, and Eliquis.  Patient with valid DNR.  At time of evaluation, patient is without complaint.  She reports feeling "fine."  The history is provided by the patient, medical records and the EMS personnel.  Palpitations Palpitations quality:  Fast Onset quality:  Sudden Duration:  5 minutes Timing:  Sporadic Progression:  Resolved Chronicity:  Recurrent Relieved by:  Nothing Worsened by:  Nothing     No past medical history on file.  Patient Active Problem List   Diagnosis Date Noted   Bacteremia    Palliative care by specialist    Goals of care, counseling/discussion    General weakness    Sepsis ([place]) 12/03/2020   COVID-19 virus infection 12/03/2020   Acute lower UTI 12/03/2020   Cellulitis 12/03/2020   Chronic diastolic CHF (congestive heart failure) ([place]) 12/03/2020   Hypoalbuminemia 12/03/2020   AKI (acute kidney injury) ([place]) 12/03/2020   Pleural effusion on left 09/12/2020   COPD (chronic obstructive pulmonary disease) ([place]) 09/12/2020   Sick sinus syndrome ([place]) 09/12/2020   Hypoxia 09/12/2020    No past surgical history on file.   OB History   No obstetric history on file.     Family History  Problem Relation Age of Onset    Hypertension Other     Social History   Tobacco Use   Smoking status: Former    Packs/day: 1.00    Years: 25.00    Pack years: 25.00    Types: Cigarettes    Quit date: 10/01/1963    Years since quitting: 57.3    Passive exposure: Never   Smokeless tobacco: Never  Vaping Use   Vaping Use: Never used  Substance Use Topics   Alcohol use: Never   Drug use: Never    Home Medications Prior to Admission medications   Medication Sig Start Date End Date Taking? Authorizing Provider  albuterol (VENTOLIN HFA) 108 (90 Base) MCG/ACT inhaler Inhale 2 puffs into the lungs See admin instructions. Inhale 2 puffs by mouth twice daily. May inhale 2 puffs by mouth every 4 hours as needed for wheezing.    [provider]  apixaban (ELIQUIS) 5 MG TABS tablet Take 1 tablet (5 mg total) by mouth 2 (two) times daily. 12/14/20   Caren Griffins, MD  digoxin (LANOXIN) 0.125 MG tablet Take 0.125 mg by mouth daily. Hold and notify MD if HR less than 60 04/25/20   [provider]  Ensure (ENSURE) Take 237 mLs by mouth 2 (two) times daily between meals.    [provider]  esomeprazole (NEXIUM) 10 MG packet Take 10 mg by mouth every 12 (twelve) hours as needed for heartburn.    [provider]  flecainide (TAMBOCOR) 50 MG tablet Take 75 mg by mouth 2 (two) times daily. 07/27/20  [provider]  furosemide (LASIX) 20 MG tablet Take 20 mg by mouth daily. 08/05/20   [provider]  levalbuterol Penne Lash) 0.63 MG/3ML nebulizer solution Take 3 mLs by nebulization every 6 (six) hours as needed for wheezing. 07/20/19   [provider]  metoprolol tartrate (LOPRESSOR) 25 MG tablet Take 25 mg by mouth 2 (two) times daily. 04/25/20   [provider]  Multiple Vitamins-Minerals (MULTIVITAMIN WITH MINERALS) tablet Take 1 tablet by mouth daily.    [provider]  OXYGEN Inhale 2 L into the lungs continuous.    [provider]   tamsulosin (FLOMAX) 0.4 MG CAPS capsule Take 1 capsule (0.4 mg total) by mouth daily after breakfast. 12/14/20   Gherghe, Vella Redhead, MD  TRELEGY ELLIPTA 100-62.5-25 MCG/INH AEPB Take 1 puff by mouth daily. 08/06/20   [provider]    Allergies    Amoxicillin, Doxycycline, Levofloxacin, and Morphine and related  Review of Systems   Review of Systems  Cardiovascular:  Positive for palpitations.  All other systems reviewed and are negative.  Physical Exam Updated Vital Signs There were no vitals taken for this visit.  Physical Exam Vitals and nursing note reviewed.  Constitutional:      General: She is not in acute distress.    Appearance: Normal appearance. She is well-developed.  HENT:     Head: Normocephalic and atraumatic.  Eyes:     Conjunctiva/sclera: Conjunctivae normal.     Pupils: Pupils are equal, round, and reactive to light.  Cardiovascular:     Rate and Rhythm: Normal rate and regular rhythm.     Heart sounds: Normal heart sounds.  Pulmonary:     Effort: Pulmonary effort is normal. No respiratory distress.     Breath sounds: Normal breath sounds.  Abdominal:     General: There is no distension.     Palpations: Abdomen is soft.     Tenderness: There is no abdominal tenderness.  Musculoskeletal:        General: No deformity. Normal range of motion.     Cervical back: Normal range of motion and neck supple.  Skin:    General: Skin is warm and dry.  Neurological:     General: No focal deficit present.     Mental Status: She is alert and oriented to person, place, and time.    ED Results / Procedures / Treatments   Labs (all labs ordered are listed, but only abnormal results are displayed) Labs Reviewed  BASIC METABOLIC PANEL  CBC WITH DIFFERENTIAL/PLATELET  DIGOXIN LEVEL    EKG None  Radiology No results found.  Procedures Procedures   Medications Ordered in ED Medications - No data to display  ED Course  I have reviewed the triage  vital signs and the nursing notes.  Pertinent labs & imaging results that were available during my care of the patient were reviewed by me and considered in my medical decision making (see chart for details).    MDM Rules/Calculators/A&P                           MDM  MSE complete  Samantha Fitzgerald was evaluated in Emergency Department on 01/29/2021 for the symptoms described in the history of present illness. She was evaluated in the context of the global COVID-19 pandemic, which necessitated consideration that the patient might be at risk for infection with the SARS-CoV-2 virus that causes COVID-19. Institutional protocols and algorithms  that pertain to the evaluation of patients at risk for COVID-19 are in a state of rapid change based on information released by regulatory bodies including the CDC and federal and state organizations. These policies and algorithms were followed during the patient's care in the ED.  Patient with reported history of SVT.   She reports episode of rapid heart rate. Episode was brief.   She is completely asymptomatic.   Screening labs obtained are without significant abnormality.  Patient's daughter is at bedside.  Patient and patient's daughter desired DC home.  All labs and imaging findings discussed with patient and patient's daughter at bedside. They understand need for close follow-up.  They understand importance of returning to ED if recurrent palpitations or chest pain or shortness of breath develop.   Final Clinical Impression(s) / ED Diagnoses Final diagnoses:  Tachycardia    Rx / DC Orders ED Discharge Orders     None        Valarie Merino, MD 01/29/21 1106

## 2021-01-29 NOTE — Discharge Instructions (Signed)
Return for any problem.  ?

## 2021-01-29 NOTE — ED Triage Notes (Signed)
Pt bib [facility] from [facility] where they found here with a fast heart rate this morning when they checked her vitals. Pt does have history of anxiety so facility gave her ativan. Pt arrives with no complaints. VSS. AOx4

## 2021-02-26 ENCOUNTER — Encounter (HOSPITAL_COMMUNITY): Payer: Self-pay | Admitting: Emergency Medicine

## 2021-02-26 ENCOUNTER — Other Ambulatory Visit: Payer: Self-pay

## 2021-02-26 ENCOUNTER — Emergency Department (HOSPITAL_COMMUNITY): Payer: Medicare Other

## 2021-02-26 ENCOUNTER — Inpatient Hospital Stay (HOSPITAL_COMMUNITY)
Admission: EM | Admit: 2021-02-26 | Discharge: 2021-03-02 | DRG: 193 | Disposition: A | Discharge status: Skilled Nursing Facility | Payer: Medicare Other | Source: Skilled Nursing Facility | Attending: Internal Medicine | Admitting: Internal Medicine

## 2021-02-26 DIAGNOSIS — Z85118 Personal history of other malignant neoplasm of bronchus and lung: Secondary | ICD-10-CM | POA: Diagnosis not present

## 2021-02-26 DIAGNOSIS — Z86718 Personal history of other venous thrombosis and embolism: Secondary | ICD-10-CM

## 2021-02-26 DIAGNOSIS — Z8249 Family history of ischemic heart disease and other diseases of the circulatory system: Secondary | ICD-10-CM

## 2021-02-26 DIAGNOSIS — Z7951 Long term (current) use of inhaled steroids: Secondary | ICD-10-CM

## 2021-02-26 DIAGNOSIS — Z79899 Other long term (current) drug therapy: Secondary | ICD-10-CM

## 2021-02-26 DIAGNOSIS — I5032 Chronic diastolic (congestive) heart failure: Secondary | ICD-10-CM | POA: Diagnosis present

## 2021-02-26 DIAGNOSIS — Z20822 Contact with and (suspected) exposure to covid-19: Secondary | ICD-10-CM | POA: Diagnosis present

## 2021-02-26 DIAGNOSIS — R0902 Hypoxemia: Secondary | ICD-10-CM | POA: Diagnosis present

## 2021-02-26 DIAGNOSIS — J9622 Acute and chronic respiratory failure with hypercapnia: Secondary | ICD-10-CM | POA: Diagnosis present

## 2021-02-26 DIAGNOSIS — I509 Heart failure, unspecified: Secondary | ICD-10-CM

## 2021-02-26 DIAGNOSIS — G9341 Metabolic encephalopathy: Secondary | ICD-10-CM | POA: Diagnosis present

## 2021-02-26 DIAGNOSIS — J441 Chronic obstructive pulmonary disease with (acute) exacerbation: Secondary | ICD-10-CM | POA: Diagnosis present

## 2021-02-26 DIAGNOSIS — I495 Sick sinus syndrome: Secondary | ICD-10-CM | POA: Diagnosis present

## 2021-02-26 DIAGNOSIS — J189 Pneumonia, unspecified organism: Secondary | ICD-10-CM | POA: Diagnosis present

## 2021-02-26 DIAGNOSIS — Z7901 Long term (current) use of anticoagulants: Secondary | ICD-10-CM | POA: Diagnosis not present

## 2021-02-26 DIAGNOSIS — Z87891 Personal history of nicotine dependence: Secondary | ICD-10-CM | POA: Diagnosis not present

## 2021-02-26 DIAGNOSIS — R413 Other amnesia: Secondary | ICD-10-CM | POA: Diagnosis present

## 2021-02-26 DIAGNOSIS — Z66 Do not resuscitate: Secondary | ICD-10-CM | POA: Diagnosis present

## 2021-02-26 DIAGNOSIS — Z9981 Dependence on supplemental oxygen: Secondary | ICD-10-CM

## 2021-02-26 DIAGNOSIS — I5033 Acute on chronic diastolic (congestive) heart failure: Secondary | ICD-10-CM | POA: Diagnosis present

## 2021-02-26 DIAGNOSIS — F419 Anxiety disorder, unspecified: Secondary | ICD-10-CM | POA: Diagnosis present

## 2021-02-26 DIAGNOSIS — Z87440 Personal history of urinary (tract) infections: Secondary | ICD-10-CM | POA: Diagnosis not present

## 2021-02-26 DIAGNOSIS — L89151 Pressure ulcer of sacral region, stage 1: Secondary | ICD-10-CM | POA: Diagnosis present

## 2021-02-26 DIAGNOSIS — R531 Weakness: Secondary | ICD-10-CM

## 2021-02-26 DIAGNOSIS — Z880 Allergy status to penicillin: Secondary | ICD-10-CM | POA: Diagnosis not present

## 2021-02-26 DIAGNOSIS — Z885 Allergy status to narcotic agent status: Secondary | ICD-10-CM | POA: Diagnosis not present

## 2021-02-26 DIAGNOSIS — J449 Chronic obstructive pulmonary disease, unspecified: Secondary | ICD-10-CM | POA: Diagnosis present

## 2021-02-26 DIAGNOSIS — L8990 Pressure ulcer of unspecified site, unspecified stage: Secondary | ICD-10-CM | POA: Insufficient documentation

## 2021-02-26 DIAGNOSIS — J9621 Acute and chronic respiratory failure with hypoxia: Secondary | ICD-10-CM | POA: Diagnosis present

## 2021-02-26 DIAGNOSIS — Z881 Allergy status to other antibiotic agents status: Secondary | ICD-10-CM

## 2021-02-26 DIAGNOSIS — J90 Pleural effusion, not elsewhere classified: Secondary | ICD-10-CM | POA: Diagnosis present

## 2021-02-26 LAB — URINALYSIS, ROUTINE W REFLEX MICROSCOPIC
Bilirubin Urine: NEGATIVE
Glucose, UA: NEGATIVE mg/dL
Hgb urine dipstick: NEGATIVE
Ketones, ur: NEGATIVE mg/dL
Nitrite: NEGATIVE
Protein, ur: NEGATIVE mg/dL
Specific Gravity, Urine: 1.009 (ref 1.005–1.030)
pH: 5 (ref 5.0–8.0)

## 2021-02-26 LAB — CBC WITH DIFFERENTIAL/PLATELET
Abs Immature Granulocytes: 0.21 10*3/uL — ABNORMAL HIGH (ref 0.00–0.07)
Basophils Absolute: 0.1 10*3/uL (ref 0.0–0.1)
Basophils Relative: 0 %
Eosinophils Absolute: 0 10*3/uL (ref 0.0–0.5)
Eosinophils Relative: 0 %
HCT: 34 % — ABNORMAL LOW (ref 36.0–46.0)
Hemoglobin: 10.7 g/dL — ABNORMAL LOW (ref 12.0–15.0)
Immature Granulocytes: 1 %
Lymphocytes Relative: 3 %
Lymphs Abs: 0.7 10*3/uL (ref 0.7–4.0)
MCH: 30.1 pg (ref 26.0–34.0)
MCHC: 31.5 g/dL (ref 30.0–36.0)
MCV: 95.5 fL (ref 80.0–100.0)
Monocytes Absolute: 0.7 10*3/uL (ref 0.1–1.0)
Monocytes Relative: 3 %
Neutro Abs: 24.7 10*3/uL — ABNORMAL HIGH (ref 1.7–7.7)
Neutrophils Relative %: 93 %
Platelets: 415 10*3/uL — ABNORMAL HIGH (ref 150–400)
RBC: 3.56 MIL/uL — ABNORMAL LOW (ref 3.87–5.11)
RDW: 14.9 % (ref 11.5–15.5)
WBC: 26.4 10*3/uL — ABNORMAL HIGH (ref 4.0–10.5)
nRBC: 0 % (ref 0.0–0.2)

## 2021-02-26 LAB — COMPREHENSIVE METABOLIC PANEL
ALT: 13 U/L (ref 0–44)
AST: 18 U/L (ref 15–41)
Albumin: 2.9 g/dL — ABNORMAL LOW (ref 3.5–5.0)
Alkaline Phosphatase: 83 U/L (ref 38–126)
Anion gap: 6 (ref 5–15)
BUN: 19 mg/dL (ref 8–23)
CO2: 37 mmol/L — ABNORMAL HIGH (ref 22–32)
Calcium: 8.3 mg/dL — ABNORMAL LOW (ref 8.9–10.3)
Chloride: 89 mmol/L — ABNORMAL LOW (ref 98–111)
Creatinine, Ser: 0.66 mg/dL (ref 0.44–1.00)
GFR, Estimated: >60 mL/min (ref >60)
Glucose, Bld: 159 mg/dL — ABNORMAL HIGH (ref 70–99)
Potassium: 4.2 mmol/L (ref 3.5–5.1)
Sodium: 132 mmol/L — ABNORMAL LOW (ref 135–145)
Total Bilirubin: 0.5 mg/dL (ref 0.3–1.2)
Total Protein: 6.4 g/dL — ABNORMAL LOW (ref 6.5–8.1)

## 2021-02-26 LAB — LACTIC ACID, PLASMA
Lactic Acid, Venous: 1 mmol/L (ref 0.5–1.9)
Lactic Acid, Venous: 1.2 mmol/L (ref 0.5–1.9)

## 2021-02-26 LAB — BLOOD GAS, VENOUS
Acid-Base Excess: 9.3 mmol/L — ABNORMAL HIGH (ref 0.0–2.0)
Bicarbonate: 38 mmol/L — ABNORMAL HIGH (ref 20.0–28.0)
O2 Saturation: 76 %
Patient temperature: 98.6
pCO2, Ven: 85.4 mmHg (ref 44.0–60.0)
pH, Ven: 7.271 (ref 7.250–7.430)
pO2, Ven: 48 mmHg — ABNORMAL HIGH (ref 32.0–45.0)

## 2021-02-26 LAB — BRAIN NATRIURETIC PEPTIDE: B Natriuretic Peptide: 391 pg/mL — ABNORMAL HIGH (ref 0.0–100.0)

## 2021-02-26 LAB — RESP PANEL BY RT-PCR (FLU A&B, COVID) ARPGX2
Influenza A by PCR: NEGATIVE
Influenza B by PCR: NEGATIVE
SARS Coronavirus 2 by RT PCR: NEGATIVE

## 2021-02-26 LAB — PROTIME-INR
INR: 1.5 — ABNORMAL HIGH (ref 0.8–1.2)
Prothrombin Time: 18.4 seconds — ABNORMAL HIGH (ref 11.4–15.2)

## 2021-02-26 MED ORDER — ALBUTEROL SULFATE HFA 108 (90 BASE) MCG/ACT IN AERS
2.0000 | INHALATION_SPRAY | Freq: Once | RESPIRATORY_TRACT | Status: DC
  Filled 2021-02-26: qty 6.7

## 2021-02-26 MED ORDER — FUROSEMIDE 10 MG/ML IJ SOLN
20.0000 mg | Freq: Once | INTRAMUSCULAR | Status: AC
  Administered 2021-02-26: 20 mg via INTRAVENOUS
  Filled 2021-02-26: qty 4

## 2021-02-26 MED ORDER — DEXAMETHASONE SODIUM PHOSPHATE 10 MG/ML IJ SOLN
8.0000 mg | Freq: Once | INTRAMUSCULAR | Status: AC
  Administered 2021-02-26: 8 mg via INTRAVENOUS
  Filled 2021-02-26: qty 1

## 2021-02-26 NOTE — ED Triage Notes (Signed)
Pt arrives from [facility] w/ c/o [facility] and hypoxia. Pt dx w/ pneumonia recently. Nursing home staff unable to tell when dx. Pt is not on any antibiotics currently. Rhonchi heard in lungs. Pt was found at 80% on 4L [place] by EMS. When placed on NRB at 15L, pt satting at 98%. Hx CHF Pt wears 4L at baseline. 13mL fluids given.  110 HR  130/62 BP 27 RR

## 2021-02-26 NOTE — H&P (Signed)
History and Physical    Samantha Fitzgerald QIO:[id number] DOB: 05-Oct-1924 DOA: 02/26/2021  PCP: Pcp, No  Patient coming from: [facility]  I have personally briefly reviewed patient's old medical records in [facility]  Chief Complaint: Shortness of breath, weakness, confusion  HPI: Samantha Fitzgerald is a 87 y.o. female with medical history significant of chronic diastolic congestive heart failure, chronic respiratory failure/COPD on 2-4 L nasal cannula baseline, sick sinus syndrome, history of DVT on Eliquis, Hx Lung cancer s/p wedge resection 10 years ago, anxiety who presented to [facility] ED 12/13 via EMS from [facility] after staff found patient more confused with increased work of breathing.  Apparently patient was diagnosed with pneumonia few days ago and started on antibiotics.  On EMS arrival, patient was noted to be hypoxic with SPO2 in the 80s on her normal oxygen and was placed on nonrebreather and transported to the ED for further evaluation.  ED Course: Temperature 98.1 F, HR 102, RR 23, BP 129/56, SPO2 99% on 15 L nonrebreather mask.  WBC 26.4, hemoglobin 10.7, platelets 415.  INR 1.5.  Lactic acid 1.0, BNP elevated 391.0.  Sodium 132, potassium 4.2, chloride 89, CO2 37, BUN 19, creatinine 0.66.  Glucose 159.  VBG with pH 7.3/PCO2 85.6/PaO2 48.0.  Patient was given albuterol neb, Decadron 8 mg IV x1 and Lasix 20 mg IV x1.  Hospitalist service was consulted for further evaluation management of acute on chronic respiratory failure secondary to pneumonia versus COPD exacerbation versus decompensated diastolic congestive heart failure exacerbation.  Review of Systems:  Constitutional - + Fatigue, No Weight Loss Vision - No impaired vision, decreased visual acuity Ear/Nose/Mouth/Throat - No decreased hearing, + congestion Respiratory - + shortness of breath, + cough Cardiovascular - No chest pain, no palpitations, no peripheral edema Gastrointestinal -  No nausea, no diarrhea, no constipation, Genitourinary - No excessive urination, no urinary incontinence Integumentary - No rashes or concerning skin lesions Neurologic - No numbness, no tingling, no dizziness, no headaches, + confusion + short-term memory loss   History reviewed. No pertinent past medical history.  History reviewed. No pertinent surgical history.   reports that she quit smoking about 57 years ago. Her smoking use included cigarettes. She has a 25.00 pack-year smoking history. She has never been exposed to tobacco smoke. She has never used smokeless tobacco. She reports that she does not drink alcohol and does not use drugs.  Allergies  Allergen Reactions   Amoxicillin Other (See Comments)    Listed on MAR   Doxycycline Other (See Comments)    Listed on MAR   Levofloxacin Other (See Comments)    Listed on MAR   Morphine And Related Other (See Comments)    Listed on MAR    Family History  Problem Relation Age of Onset   Hypertension Other     Family history reviewed and not pertinent   Prior to Admission medications   Medication Sig Start Date End Date Taking? Authorizing Provider  albuterol (VENTOLIN HFA) 108 (90 Base) MCG/ACT inhaler Inhale 2 puffs into the lungs See admin instructions. Inhale 2 puffs by mouth twice daily. May inhale 2 puffs by mouth every 4 hours as needed for wheezing.    [provider]  apixaban (ELIQUIS) 5 MG TABS tablet Take 1 tablet (5 mg total) by mouth 2 (two) times daily. 12/14/20   Caren Griffins, MD  digoxin (LANOXIN) 0.125 MG tablet Take 0.125 mg by mouth daily. Hold and  notify MD if HR less than 60 04/25/20   [provider]  Ensure (ENSURE) Take 237 mLs by mouth 2 (two) times daily between meals.    [provider]  esomeprazole (NEXIUM) 10 MG packet Take 10 mg by mouth every 12 (twelve) hours as needed for heartburn.    [provider]  flecainide (TAMBOCOR) 50 MG tablet Take 75 mg by mouth 2  (two) times daily. 07/27/20   [provider]  furosemide (LASIX) 20 MG tablet Take 20 mg by mouth daily. 08/05/20   [provider]  levalbuterol Penne Lash) 0.63 MG/3ML nebulizer solution Take 3 mLs by nebulization every 6 (six) hours as needed for wheezing. 07/20/19   [provider]  metoprolol tartrate (LOPRESSOR) 25 MG tablet Take 25 mg by mouth 2 (two) times daily. 04/25/20   [provider]  Multiple Vitamins-Minerals (MULTIVITAMIN WITH MINERALS) tablet Take 1 tablet by mouth daily.    [provider]  OXYGEN Inhale 2 L into the lungs continuous.    [provider]  tamsulosin (FLOMAX) 0.4 MG CAPS capsule Take 1 capsule (0.4 mg total) by mouth daily after breakfast. 12/14/20   Gherghe, Vella Redhead, MD  TRELEGY ELLIPTA 100-62.5-25 MCG/INH AEPB Take 1 puff by mouth daily. 08/06/20   [provider]    Physical Exam: Vitals:   02/26/21 1603 02/26/21 1630 02/26/21 1700 02/26/21 1730  BP: (!) 129/56 (!) 117/50 (!) 122/49 (!) 107/50  Pulse: (!) 102 100 95 88  Resp: (!) 23 (!) 22 20 20   Temp: 98.1 F (36.7 C)     TempSrc: Oral     SpO2: 99% 100% 99% 99%    Constitutional: NAD, calm, comfortable elderly/cachectic in appearance Eyes: PERRL, lids and conjunctivae normal ENMT: Mucous membranes are dry. Posterior pharynx clear of any exudate or lesions.Normal dentition.  Neck: normal, supple, no masses, no thyromegaly Respiratory: Decreased breath sounds bilateral bases, + wheezing throughout and crackles lateral bases.  Slightly increased respiratory effort, on 15 L NRB with SPO2 100%. No accessory muscle use.  Cardiovascular: Tachycardic, regular rhythm, no murmurs / rubs / gallops. No extremity edema. 2+ pedal pulses. No carotid bruits.  Elevated JVP Abdomen: no tenderness, no masses palpated. No hepatosplenomegaly. Bowel sounds positive.  Musculoskeletal: no clubbing / cyanosis. No joint deformity upper and lower extremities. Good ROM, no  contractures. Normal muscle tone.  Skin: no rashes, lesions, ulcers. No induration Neurologic: CN 2-12 grossly intact. Sensation intact, DTR normal. Strength 5/5 in all 4.  Psychiatric: Normal judgment and insight. Alert and oriented x 3. Normal mood.    Labs on Admission: I have personally reviewed following labs and imaging studies  CBC: Recent Labs  Lab 02/26/21 1603  WBC 26.4*  NEUTROABS PENDING  HGB 10.7*  HCT 34.0*  MCV 95.5  PLT 657*   Basic Metabolic Panel: Recent Labs  Lab 02/26/21 1603  NA 132*  K 4.2  CL 89*  CO2 37*  GLUCOSE 159*  BUN 19  CREATININE 0.66  CALCIUM 8.3*   GFR: CrCl cannot be calculated (Unknown ideal weight.). Liver Function Tests: Recent Labs  Lab 02/26/21 1603  AST 18  ALT 13  ALKPHOS 83  BILITOT 0.5  PROT 6.4*  ALBUMIN 2.9*   No results for input(s): LIPASE, AMYLASE in the last 168 hours. No results for input(s): AMMONIA in the last 168 hours. Coagulation Profile: Recent Labs  Lab 02/26/21 1603  INR 1.5*   Cardiac Enzymes: No results for input(s): CKTOTAL, CKMB, CKMBINDEX,  TROPONINI in the last 168 hours. BNP (last 3 results) No results for input(s): PROBNP in the last 8760 hours. HbA1C: No results for input(s): HGBA1C in the last 72 hours. CBG: No results for input(s): GLUCAP in the last 168 hours. Lipid Profile: No results for input(s): CHOL, HDL, LDLCALC, TRIG, CHOLHDL, LDLDIRECT in the last 72 hours. Thyroid Function Tests: No results for input(s): TSH, T4TOTAL, FREET4, T3FREE, THYROIDAB in the last 72 hours. Anemia Panel: No results for input(s): VITAMINB12, FOLATE, FERRITIN, TIBC, IRON, RETICCTPCT in the last 72 hours. Urine analysis:    Component Value Date/Time   COLORURINE STRAW (A) 12/03/2020 1458   APPEARANCEUR TURBID (A) 12/03/2020 1458   LABSPEC  12/03/2020 1458    TEST NOT REPORTED DUE TO COLOR INTERFERENCE OF URINE PIGMENT   PHURINE  12/03/2020 1458    TEST NOT REPORTED DUE TO COLOR INTERFERENCE OF  URINE PIGMENT   GLUCOSEU (A) 12/03/2020 1458    TEST NOT REPORTED DUE TO COLOR INTERFERENCE OF URINE PIGMENT   HGBUR (A) 12/03/2020 1458    TEST NOT REPORTED DUE TO COLOR INTERFERENCE OF URINE PIGMENT   BILIRUBINUR (A) 12/03/2020 1458    TEST NOT REPORTED DUE TO COLOR INTERFERENCE OF URINE PIGMENT   KETONESUR (A) 12/03/2020 1458    TEST NOT REPORTED DUE TO COLOR INTERFERENCE OF URINE PIGMENT   PROTEINUR (A) 12/03/2020 1458    TEST NOT REPORTED DUE TO COLOR INTERFERENCE OF URINE PIGMENT   NITRITE (A) 12/03/2020 1458    TEST NOT REPORTED DUE TO COLOR INTERFERENCE OF URINE PIGMENT   LEUKOCYTESUR (A) 12/03/2020 1458    TEST NOT REPORTED DUE TO COLOR INTERFERENCE OF URINE PIGMENT    Radiological Exams on Admission: DG Chest Port 1 View  Result Date: 02/26/2021 CLINICAL DATA:  Shortness of breath, hypoxia, recently diagnosed with pneumonia, rhonchi EXAM: PORTABLE CHEST 1 VIEW COMPARISON:  Portable exam 1630 hours compared to 01/29/2021 FINDINGS: Normal heart size, mediastinal contours, and pulmonary vascularity. Atherosclerotic calcification aorta. Persistent LEFT pleural effusion and basilar atelectasis. Underlying emphysematous changes. Decreased atelectasis and effusion at RIGHT base. Upper lungs clear. And no definite acute infiltrate or pneumothorax. Bones demineralized. IMPRESSION: COPD changes with persistent LEFT basilar effusion and atelectasis. Decreased diffusion and atelectasis at RIGHT base. No new abnormalities. Aortic Atherosclerosis (ICD10-I70.0) and Emphysema (ICD10-J43.9). Electronically Signed   By: Lavonia Dana M.D.   On: 02/26/2021 16:55    EKG: Independently reviewed.   Assessment/Plan Principal Problem:   Acute on chronic respiratory failure with hypoxemia (HCC) Active Problems:   Pleural effusion on left   COPD (chronic obstructive pulmonary disease) (HCC)   Sick sinus syndrome (HCC)   Hypoxia   Chronic diastolic CHF (congestive heart failure) (HCC)   General  weakness   Acute metabolic encephalopathy, POA Patient presenting to the ED from SNF after being found confused by nursing staff.  Etiology likely multifactorial given acute on chronic hypoxic hypercapnic respite failure secondary to COPD exacerbation combined with diastolic congestive heart failure exacerbation and pneumonia. --Continue treatment as below  Acute on chronic hypoxic/hypercapnic respiratory failure, POA Patient was found hypoxic by EMS with SPO2 in the 80s on her normal 2-4 L of oxygen requirement nonrebreather mask.  Etiology likely multifactorial in the setting of COPD exacerbation, decompensated diastolic congestive heart failure exacerbation, pneumonia and pleural effusion.  BG with PCO2 85.4. --BiPAP --Treatment as below  COPD exacerbation, oxygen dependence 2-4L Patient on Trilegy Ellipta and Xopenex nebs at baseline. --Solu-Medrol 60 mg IV q12h --DuoNeb scheduled every  6 hours --Brovana neb twice daily --Pulmicort neb twice daily --Azithromycin 500mg  IV q24h --BiPAP, continue supplemental oxygen, maintain SPO2 >88% --Repeat VBG in the a.m.  Community-acquired pneumonia Chest x-ray with left lower lobe consolidation versus atelectasis.  WBC count elevated 26.4. --Check procalcitonin --Strep pneumo/Legionella urinary antigen --Sputum culture, blood cultures x2, urinalysis/culture --Azithromycin 500 mg IV every 24 hours --Ceftriaxone 2 g IV every 24 hours --CBC daily --Titrate supplemental oxygen to maintain SPO2 greater than 88% --BiPAP as above  Acute on chronic diastolic congestive heart failure exacerbation Patient with elevated BNP 391.0, elevated JVP on physical exam.  Recent TTE 12/08/2020 with LVEF 60 to 65%, no LV regional wall motion abnormalities. --Furosemide 40 mg IV every 12 hours --Strict I's and O's and daily weights --Follow BMP daily  Left pleural effusion --IR for thoracentesis  Anxiety --Sertraline 25 mg p.o. daily --Ativan 0.5mg  IV q8h  PRN   Hx SSS --check dig level --Continue flecainide, digoxin  Hx DVT: --Holding Eliquis for potential thoracentesis tomorrow    DVT prophylaxis: SCDs, holding chemical DVT prophylaxis/Eliquis for planned thoracentesis Code Status: DNR Family Communication: Daughter at bedside Disposition Plan: Anticipate discharge back to [facility] when medically ready Consults called: None Admission status: Inpatient Level of care: Stepdown   Severity of Illness: The appropriate patient status for this patient is INPATIENT. Inpatient status is judged to be reasonable and necessary in order to provide the required intensity of service to ensure the patient's safety. The patient's presenting symptoms, physical exam findings, and initial radiographic and laboratory data in the context of their chronic comorbidities is felt to place them at high risk for further clinical deterioration. Furthermore, it is not anticipated that the patient will be medically stable for discharge from the hospital within 2 midnights of admission. The following factors support the patient status of inpatient.   " The patient's presenting symptoms include confusion, weakness, shortness of breath " The worrisome physical exam findings include confusion, increased work of breathing, hypoxia " The initial radiographic and laboratory data are worrisome because of left pleural effusion, elevated WBC count 26.4, PCO2 85.6 on VBG " The chronic co-morbidities include sick sinus syndrome, COPD, chronic hypoxic respiratory failure on 2-4 L at baseline, diastolic congestive heart failure, anxiety.   * I certify that at the point of admission it is my clinical judgment that the patient will require inpatient hospital care spanning beyond 2 midnights from the point of admission due to high intensity of service, high risk for further deterioration and high frequency of surveillance required.*    Mackinzee Roszak J [place] DO [facility] Available via Epic secure chat 7am-7pm After these hours, please refer to coverage provider listed on [email or web address] 02/26/2021, 6:20 PM

## 2021-02-26 NOTE — ED Provider Notes (Signed)
Samantha Fitzgerald DEPT Provider Note   CSN: [id number] Arrival date & time: 02/26/21  1548     History Chief Complaint  Patient presents with   Shortness of Breath    Samantha Fitzgerald is a 87 y.o. female who presents to the ED today with c/o shortness of breath and a dry cough for the last few days.  History obtained from patient and to a greater extent from her daughter over the phone.  The patient's daughter states that the patient started having a dry cough a few days ago.  She was recently treated for pneumonia about a month ago, finishing treatment with antibiotics with subsequent improvement 1 week after that.  +orthopnea.  Daughter also states that COVID is "running rampant" at [facility] where the patient lives normally.  The daughter was told this morning that the patient has started declining and that her hands were blue, prompting them to call EMS to transfer her to the emergency department.  The patient denies chest pain. She has a history of COPD and is on 4 L at home.  Also with a history of chronic diastolic heart failure and with a h/o DVT on eliquis.      Shortness of Breath Associated symptoms: cough   Associated symptoms: no chest pain      History reviewed. No pertinent past medical history.  Patient Active Problem List   Diagnosis Date Noted   Acute on chronic respiratory failure with hypoxemia ([place]) 02/26/2021   Bacteremia    Palliative care by specialist    Goals of care, counseling/discussion    General weakness    Sepsis ([place]) 12/03/2020   COVID-19 virus infection 12/03/2020   Acute lower UTI 12/03/2020   Cellulitis 12/03/2020   Chronic diastolic CHF (congestive heart failure) ([place]) 12/03/2020   Hypoalbuminemia 12/03/2020   AKI (acute kidney injury) ([place]) 12/03/2020   Pleural effusion on left 09/12/2020   COPD (chronic obstructive pulmonary disease) ([place]) 09/12/2020   Sick sinus syndrome ([place]) 09/12/2020    Hypoxia 09/12/2020    History reviewed. No pertinent surgical history.   OB History   No obstetric history on file.     Family History  Problem Relation Age of Onset   Hypertension Other     Social History   Tobacco Use   Smoking status: Former    Packs/day: 1.00    Years: 25.00    Pack years: 25.00    Types: Cigarettes    Quit date: 10/01/1963    Years since quitting: 57.4    Passive exposure: Never   Smokeless tobacco: Never  Vaping Use   Vaping Use: Never used  Substance Use Topics   Alcohol use: Never   Drug use: Never    Home Medications Prior to Admission medications   Medication Sig Start Date End Date Taking? Authorizing Provider  albuterol (VENTOLIN HFA) 108 (90 Base) MCG/ACT inhaler Inhale 2 puffs into the lungs See admin instructions. Inhale 2 puffs by mouth twice daily. May inhale 2 puffs by mouth every 4 hours as needed for wheezing.    [provider]  apixaban (ELIQUIS) 5 MG TABS tablet Take 1 tablet (5 mg total) by mouth 2 (two) times daily. 12/14/20   Caren Griffins, MD  digoxin (LANOXIN) 0.125 MG tablet Take 0.125 mg by mouth daily. Hold and notify MD if HR less than 60 04/25/20   [provider]  Ensure (ENSURE) Take 237 mLs by mouth 2 (two) times daily between  meals.    [provider]  esomeprazole (NEXIUM) 10 MG packet Take 10 mg by mouth every 12 (twelve) hours as needed for heartburn.    [provider]  flecainide (TAMBOCOR) 50 MG tablet Take 75 mg by mouth 2 (two) times daily. 07/27/20   [provider]  furosemide (LASIX) 20 MG tablet Take 20 mg by mouth daily. 08/05/20   [provider]  levalbuterol Penne Lash) 0.63 MG/3ML nebulizer solution Take 3 mLs by nebulization every 6 (six) hours as needed for wheezing. 07/20/19   [provider]  metoprolol tartrate (LOPRESSOR) 25 MG tablet Take 25 mg by mouth 2 (two) times daily. 04/25/20   [provider]  Multiple Vitamins-Minerals  (MULTIVITAMIN WITH MINERALS) tablet Take 1 tablet by mouth daily.    [provider]  OXYGEN Inhale 2 L into the lungs continuous.    [provider]  tamsulosin (FLOMAX) 0.4 MG CAPS capsule Take 1 capsule (0.4 mg total) by mouth daily after breakfast. 12/14/20   Gherghe, Vella Redhead, MD  TRELEGY ELLIPTA 100-62.5-25 MCG/INH AEPB Take 1 puff by mouth daily. 08/06/20   [provider]    Allergies    Amoxicillin, Doxycycline, Levofloxacin, and Morphine and related  Review of Systems   Review of Systems  Constitutional: Negative.   HENT: Negative.    Eyes: Negative.   Respiratory:  Positive for cough and shortness of breath.   Cardiovascular:  Positive for palpitations. Negative for chest pain.  Gastrointestinal: Negative.   Endocrine: Negative.   Genitourinary: Negative.  Negative for difficulty urinating and dysuria.  Musculoskeletal: Negative.   Skin:  Positive for color change.  Allergic/Immunologic: Negative.   Neurological: Negative.   Hematological: Negative.   Psychiatric/Behavioral: Negative.     Physical Exam Updated Vital Signs BP (!) 130/54    Pulse 88    Temp 98.1 F (36.7 C) (Oral)    Resp (!) 23    SpO2 100%   Physical Exam Constitutional:      Appearance: She is ill-appearing.  HENT:     Head: Normocephalic and atraumatic.  Eyes:     Extraocular Movements: Extraocular movements intact.     Pupils: Pupils are equal, round, and reactive to light.  Cardiovascular:     Rate and Rhythm: Regular rhythm. Tachycardia present.     Heart sounds: No murmur heard.   No friction rub. No gallop.  Pulmonary:     Effort: Tachypnea, accessory muscle usage and respiratory distress present.     Breath sounds: Rales present.  Abdominal:     General: Abdomen is flat. There is no distension.     Tenderness: There is no abdominal tenderness.  Musculoskeletal:        General: No swelling.     Right lower leg: No edema.     Left lower leg: No edema.   Skin:    General: Skin is warm and dry.  Neurological:     General: No focal deficit present.     Mental Status: She is alert and oriented to person, place, and time.  Psychiatric:        Mood and Affect: Mood normal.        Behavior: Behavior normal.    ED Results / Procedures / Treatments   Labs (all labs ordered are listed, but only abnormal results are displayed) Labs Reviewed  COMPREHENSIVE METABOLIC PANEL - Abnormal; Notable for the following components:      Result Value   Sodium 132 (*)  Chloride 89 (*)    CO2 37 (*)    Glucose, Bld 159 (*)    Calcium 8.3 (*)    Total Protein 6.4 (*)    Albumin 2.9 (*)    All other components within normal limits  CBC WITH DIFFERENTIAL/PLATELET - Abnormal; Notable for the following components:   WBC 26.4 (*)    RBC 3.56 (*)    Hemoglobin 10.7 (*)    HCT 34.0 (*)    Platelets 415 (*)    Neutro Abs 24.7 (*)    Abs Immature Granulocytes 0.21 (*)    All other components within normal limits  PROTIME-INR - Abnormal; Notable for the following components:   Prothrombin Time 18.4 (*)    INR 1.5 (*)    All other components within normal limits  BLOOD GAS, VENOUS - Abnormal; Notable for the following components:   pCO2, Ven 85.4 (*)    pO2, Ven 48.0 (*)    Bicarbonate 38.0 (*)    Acid-Base Excess 9.3 (*)    All other components within normal limits  BRAIN NATRIURETIC PEPTIDE - Abnormal; Notable for the following components:   B Natriuretic Peptide 391.0 (*)    All other components within normal limits  RESP PANEL BY RT-PCR (FLU A&B, COVID) ARPGX2  CULTURE, BLOOD (ROUTINE X 2)  CULTURE, BLOOD (ROUTINE X 2)  LACTIC ACID, PLASMA  LACTIC ACID, PLASMA  URINALYSIS, ROUTINE W REFLEX MICROSCOPIC    EKG None  Radiology DG Chest Port 1 View  Result Date: 02/26/2021 CLINICAL DATA:  Shortness of breath, hypoxia, recently diagnosed with pneumonia, rhonchi EXAM: PORTABLE CHEST 1 VIEW COMPARISON:  Portable exam 1630 hours compared to  01/29/2021 FINDINGS: Normal heart size, mediastinal contours, and pulmonary vascularity. Atherosclerotic calcification aorta. Persistent LEFT pleural effusion and basilar atelectasis. Underlying emphysematous changes. Decreased atelectasis and effusion at RIGHT base. Upper lungs clear. And no definite acute infiltrate or pneumothorax. Bones demineralized. IMPRESSION: COPD changes with persistent LEFT basilar effusion and atelectasis. Decreased diffusion and atelectasis at RIGHT base. No new abnormalities. Aortic Atherosclerosis (ICD10-I70.0) and Emphysema (ICD10-J43.9). Electronically Signed   By: Lavonia Dana M.D.   On: 02/26/2021 16:55    Procedures Procedures   Medications Ordered in ED Medications  albuterol (VENTOLIN HFA) 108 (90 Base) MCG/ACT inhaler 2 puff (0 puffs Inhalation Hold 02/26/21 1730)  dexamethasone (DECADRON) injection 8 mg (8 mg Intravenous Given 02/26/21 1730)  furosemide (LASIX) injection 20 mg (20 mg Intravenous Given 02/26/21 1737)    ED Course  I have reviewed the triage vital signs and the nursing notes.  Pertinent labs & imaging results that were available during my care of the patient were reviewed by me and considered in my medical decision making (see chart for details).   MDM Rules/Calculators/A&P                         This is a 87 year old with a history of COPD on 4 L, chronic diastolic heart failure on Lasix, and prior DVT on Eliquis who presented to the ED with shortness of breath and hypoxia.  On arrival, O2 sats were in the 80s, and she was promptly placed on 15 L nonrebreather with O2 sats improving to 99%.  On exam, the patient is satting in the 100s with some accessory muscle use, and she is alert and oriented x3.  Exam notable for bibasilar crackles, no pitting edema was noted.  Labs significant for NA 132, bicarb 37, glucose 159, BUN  19, creatinine 0.66.  WBC 26.4.  Lactic acid WNL.  PT 18.4, INR 1.5.  BNP 391.  VBG significant for 7.27/85.4/48/38. COVID  negative. CXR significant for COPD changes with left basilar effusion and atelectasis.   Overall presentation consistent with acute on chronic respiratory failure, COPD exacerbation versus CHF exacerbation for which pt requires admission to the hospital. Other workup for other possible etiologies such as UTI is currently pending. Pt has received albuterol, IV decadron, and 1x Lasix 20 mg. We have consulted and signed out to the hospitalist team for further management and evaluation.  Final Clinical Impression(s) / ED Diagnoses Final diagnoses:  None    Rx / DC Orders ED Discharge Orders     None        Orvis Brill, MD 02/26/21 2018    Drenda Freeze, MD 02/27/21 [phone]

## 2021-02-26 NOTE — ED Notes (Signed)
Pt brief changed. Blankets provided. [place] provided.

## 2021-02-27 ENCOUNTER — Inpatient Hospital Stay (HOSPITAL_COMMUNITY): Payer: Medicare Other

## 2021-02-27 DIAGNOSIS — J9621 Acute and chronic respiratory failure with hypoxia: Secondary | ICD-10-CM

## 2021-02-27 DIAGNOSIS — L8990 Pressure ulcer of unspecified site, unspecified stage: Secondary | ICD-10-CM | POA: Insufficient documentation

## 2021-02-27 LAB — BLOOD GAS, VENOUS
Acid-Base Excess: 9.1 mmol/L — ABNORMAL HIGH (ref 0.0–2.0)
Bicarbonate: 37.6 mmol/L — ABNORMAL HIGH (ref 20.0–28.0)
O2 Saturation: 70.6 %
Patient temperature: 97.4
pCO2, Ven: 77.7 mmHg (ref 44.0–60.0)
pH, Ven: 7.302 (ref 7.250–7.430)
pO2, Ven: 39.2 mmHg (ref 32.0–45.0)

## 2021-02-27 LAB — MRSA NEXT GEN BY PCR, NASAL: MRSA by PCR Next Gen: NOT DETECTED

## 2021-02-27 LAB — CBC
HCT: 32.9 % — ABNORMAL LOW (ref 36.0–46.0)
Hemoglobin: 10.9 g/dL — ABNORMAL LOW (ref 12.0–15.0)
MCH: 30.4 pg (ref 26.0–34.0)
MCHC: 33.1 g/dL (ref 30.0–36.0)
MCV: 91.9 fL (ref 80.0–100.0)
Platelets: 376 10*3/uL (ref 150–400)
RBC: 3.58 MIL/uL — ABNORMAL LOW (ref 3.87–5.11)
RDW: 14.7 % (ref 11.5–15.5)
WBC: 19.7 10*3/uL — ABNORMAL HIGH (ref 4.0–10.5)
nRBC: 0 % (ref 0.0–0.2)

## 2021-02-27 LAB — BASIC METABOLIC PANEL
Anion gap: 12 (ref 5–15)
BUN: 21 mg/dL (ref 8–23)
CO2: 31 mmol/L (ref 22–32)
Calcium: 8.5 mg/dL — ABNORMAL LOW (ref 8.9–10.3)
Chloride: 90 mmol/L — ABNORMAL LOW (ref 98–111)
Creatinine, Ser: 0.64 mg/dL (ref 0.44–1.00)
GFR, Estimated: >60 mL/min (ref >60)
Glucose, Bld: 111 mg/dL — ABNORMAL HIGH (ref 70–99)
Potassium: 4.7 mmol/L (ref 3.5–5.1)
Sodium: 133 mmol/L — ABNORMAL LOW (ref 135–145)

## 2021-02-27 LAB — PROTEIN, PLEURAL OR PERITONEAL FLUID: Total protein, fluid: 3.3 g/dL

## 2021-02-27 LAB — BODY FLUID CELL COUNT WITH DIFFERENTIAL
Eos, Fluid: 0 %
Lymphs, Fluid: 60 %
Monocyte-Macrophage-Serous Fluid: 23 % — ABNORMAL LOW (ref 50–90)
Neutrophil Count, Fluid: 17 % (ref 0–25)
Total Nucleated Cell Count, Fluid: 453 cu mm (ref 0–1000)

## 2021-02-27 LAB — GLUCOSE, CAPILLARY: Glucose-Capillary: 123 mg/dL — ABNORMAL HIGH (ref 70–99)

## 2021-02-27 LAB — LACTATE DEHYDROGENASE: LDH: 141 U/L (ref 98–192)

## 2021-02-27 LAB — DIGOXIN LEVEL: Digoxin Level: 0.4 ng/mL — ABNORMAL LOW (ref 0.8–2.0)

## 2021-02-27 LAB — LACTATE DEHYDROGENASE, PLEURAL OR PERITONEAL FLUID: LD, Fluid: 52 U/L — ABNORMAL HIGH (ref 3–23)

## 2021-02-27 LAB — PROCALCITONIN: Procalcitonin: 2.12 ng/mL

## 2021-02-27 LAB — PROTEIN, TOTAL: Total Protein: 6.2 g/dL — ABNORMAL LOW (ref 6.5–8.1)

## 2021-02-27 MED ORDER — SODIUM CHLORIDE 0.9 % IV SOLN
2.0000 g | INTRAVENOUS | Status: DC
  Administered 2021-02-27 – 2021-03-02 (×4): 2 g via INTRAVENOUS
  Filled 2021-02-27 (×4): qty 20

## 2021-02-27 MED ORDER — ONDANSETRON HCL 4 MG/2ML IJ SOLN: 4.0000 mg | Freq: Four times a day (QID) | INTRAMUSCULAR | Status: DC | PRN

## 2021-02-27 MED ORDER — TAMSULOSIN HCL 0.4 MG PO CAPS
0.4000 mg | ORAL_CAPSULE | Freq: Every day | ORAL | Status: DC
  Administered 2021-02-28 – 2021-03-02 (×3): 0.4 mg via ORAL
  Filled 2021-02-27 (×3): qty 1

## 2021-02-27 MED ORDER — FUROSEMIDE 10 MG/ML IJ SOLN
40.0000 mg | Freq: Two times a day (BID) | INTRAMUSCULAR | Status: DC
  Administered 2021-02-27 (×2): 40 mg via INTRAVENOUS
  Filled 2021-02-27 (×2): qty 4

## 2021-02-27 MED ORDER — LIDOCAINE HCL 1 % IJ SOLN
INTRAMUSCULAR | Status: AC
  Administered 2021-02-27: 16:00:00 10 mL
  Filled 2021-02-27: qty 20

## 2021-02-27 MED ORDER — ARFORMOTEROL TARTRATE 15 MCG/2ML IN NEBU
15.0000 ug | INHALATION_SOLUTION | Freq: Two times a day (BID) | RESPIRATORY_TRACT | Status: DC
  Administered 2021-02-27 – 2021-03-02 (×7): 15 ug via RESPIRATORY_TRACT
  Filled 2021-02-27 (×7): qty 2

## 2021-02-27 MED ORDER — ACETAMINOPHEN 325 MG PO TABS: 650.0000 mg | ORAL_TABLET | Freq: Four times a day (QID) | ORAL | Status: DC | PRN

## 2021-02-27 MED ORDER — LORAZEPAM 2 MG/ML IJ SOLN
0.5000 mg | Freq: Three times a day (TID) | INTRAMUSCULAR | Status: DC | PRN
  Administered 2021-02-28 – 2021-03-01 (×3): 0.5 mg via INTRAVENOUS
  Filled 2021-02-27 (×3): qty 1

## 2021-02-27 MED ORDER — DIGOXIN 125 MCG PO TABS
0.1250 mg | ORAL_TABLET | Freq: Every day | ORAL | Status: DC
  Administered 2021-02-28 – 2021-03-02 (×3): 0.125 mg via ORAL
  Filled 2021-02-27 (×3): qty 1

## 2021-02-27 MED ORDER — POLYETHYLENE GLYCOL 3350 17 G PO PACK: 17.0000 g | PACK | Freq: Every day | ORAL | Status: DC | PRN

## 2021-02-27 MED ORDER — ACETAMINOPHEN 650 MG RE SUPP: 650.0000 mg | Freq: Four times a day (QID) | RECTAL | Status: DC | PRN

## 2021-02-27 MED ORDER — METHYLPREDNISOLONE SODIUM SUCC 125 MG IJ SOLR
60.0000 mg | Freq: Two times a day (BID) | INTRAMUSCULAR | Status: DC
  Administered 2021-02-27 (×2): 60 mg via INTRAVENOUS
  Filled 2021-02-27: qty 2

## 2021-02-27 MED ORDER — IPRATROPIUM-ALBUTEROL 0.5-2.5 (3) MG/3ML IN SOLN
3.0000 mL | Freq: Four times a day (QID) | RESPIRATORY_TRACT | Status: DC
  Administered 2021-02-27 (×3): 3 mL via RESPIRATORY_TRACT
  Filled 2021-02-27 (×4): qty 3

## 2021-02-27 MED ORDER — CHLORHEXIDINE GLUCONATE CLOTH 2 % EX PADS
6.0000 | MEDICATED_PAD | Freq: Every day | CUTANEOUS | Status: DC
  Administered 2021-02-27 – 2021-03-02 (×3): 6 via TOPICAL

## 2021-02-27 MED ORDER — ORAL CARE MOUTH RINSE: 15.0000 mL | Freq: Two times a day (BID) | OROMUCOSAL | Status: DC

## 2021-02-27 MED ORDER — FLECAINIDE ACETATE 50 MG PO TABS
75.0000 mg | ORAL_TABLET | Freq: Two times a day (BID) | ORAL | Status: DC
  Administered 2021-02-27 – 2021-03-02 (×6): 75 mg via ORAL
  Filled 2021-02-27 (×7): qty 2

## 2021-02-27 MED ORDER — ONDANSETRON HCL 4 MG PO TABS: 4.0000 mg | ORAL_TABLET | Freq: Four times a day (QID) | ORAL | Status: DC | PRN

## 2021-02-27 MED ORDER — BUDESONIDE 0.5 MG/2ML IN SUSP
0.5000 mg | Freq: Two times a day (BID) | RESPIRATORY_TRACT | Status: DC
  Administered 2021-02-27 – 2021-03-02 (×7): 0.5 mg via RESPIRATORY_TRACT
  Filled 2021-02-27 (×7): qty 2

## 2021-02-27 MED ORDER — IPRATROPIUM-ALBUTEROL 0.5-2.5 (3) MG/3ML IN SOLN
3.0000 mL | Freq: Three times a day (TID) | RESPIRATORY_TRACT | Status: DC
  Administered 2021-02-28 – 2021-03-02 (×8): 3 mL via RESPIRATORY_TRACT
  Filled 2021-02-27: qty 3
  Filled 2021-02-27: qty 42
  Filled 2021-02-27 (×6): qty 3

## 2021-02-27 MED ORDER — ORAL CARE MOUTH RINSE
15.0000 mL | Freq: Two times a day (BID) | OROMUCOSAL | Status: DC
  Administered 2021-02-27 – 2021-03-02 (×6): 15 mL via OROMUCOSAL

## 2021-02-27 MED ORDER — SODIUM CHLORIDE 0.9 % IV SOLN
500.0000 mg | INTRAVENOUS | Status: DC
  Administered 2021-02-27 – 2021-02-28 (×2): 500 mg via INTRAVENOUS
  Filled 2021-02-27 (×2): qty 5

## 2021-02-27 NOTE — Procedures (Signed)
PROCEDURE SUMMARY:  Successful image-guided left thoracentesis. Yielded 650 milliliters of clear yellow fluid. Procedure was aborted at this amount due to patient complaints of chest discomfort, small amount of pleural fluid remains on post procedure [place] and is likely not amenable to repeat thoracentesis at this time. Patient tolerated procedure well. EBL < 1 mL No immediate complications.  Specimen was sent for labs. Post procedure CXR pending.  Please see imaging section of Epic for full dictation.  Joaquim Nam PA-C 02/27/2021 4:20 PM

## 2021-02-27 NOTE — TOC Initial Note (Signed)
Transition of Care [facility]) - Initial/Assessment Note   Patient Details  Name: Samantha Fitzgerald MRN: [id number] Date of Birth: 03/05/1925  Transition of Care [facility]) CM/SW Contact:    Sherie Don, LCSW Phone Number: 02/27/2021, 2:22 PM  Clinical Narrative: Readmission checklist completed due to high readmission score. CSW met with patient's daughter/legal guardian, Nehemiah Massed, to complete assessment. Patient was sleeping at this time. Per daughter, patient used her 19 Medicare days for rehab at [facility]) through August 2022. Patient was at an ALF for about 3 weeks before returning to the hospital and being discharged to [facility] where she has been from September 22 through yesterday. Daughter unsure if patient will be able to return to ALF level of care, although the family has been holding the bed for 2 months. Family has paid for a 5 day bed hold at [facility].  CSW spoke with [place] in admissions at [facility]. Per Claiborne Billings, the facility will take the patient back under her Medicare for a few days. The family has been private paying for some time, so the discharge plan is to accept the patient back for short-term rehab and then back to private pay. TOC to follow.  Expected Discharge Plan: [place] Barriers to Discharge: Continued Medical Work up  Patient Goals and CMS Choice Patient states their goals for this hospitalization and ongoing recovery are:: Return to [facility] for rehab CMS Medicare.gov Compare Post Acute Care list provided to:: Patient Represenative (must comment) Choice offered to / list presented to : [facility] POA / Guardian, Adult Children Education officer, museum (daughter/legal guardian))  Expected Discharge Plan and Services Expected Discharge Plan: [place] In-house Referral: Clinical Social Work Post Acute Care Choice: [place] Living arrangements for the past 2 months: [place]          DME Arranged:  N/A DME Agency: NA  Prior Living Arrangements/Services Living arrangements for the past 2 months: [place] Lives with:: Facility Resident Patient language and need for interpreter reviewed:: Yes Do you feel safe going back to the place where you live?: Yes      Need for Family Participation in Patient Care: Yes (Comment) (Daughter is patient's legal guardian.) Care giver support system in place?: Yes (comment) Criminal Activity/Legal Involvement Pertinent to Current Situation/Hospitalization: No - Comment as needed  Activities of Daily Living Home Assistive Devices/Equipment: Cane (specify quad or straight), Oxygen, Walker (specify type) (per previous hx) ADL Screening (condition at time of admission) Patient's cognitive ability adequate to safely complete daily activities?: No Is the patient deaf or have difficulty hearing?: No Does the patient have difficulty seeing, even when wearing glasses/contacts?: No Does the patient have difficulty concentrating, remembering, or making decisions?: Yes Patient able to express need for assistance with ADLs?: Yes Does the patient have difficulty dressing or bathing?: Yes Independently performs ADLs?: No Communication: Independent Dressing (OT): Needs assistance Is this a change from baseline?: Pre-admission baseline Grooming: Needs assistance Is this a change from baseline?: Pre-admission baseline Feeding: Independent Bathing: Needs assistance Is this a change from baseline?: Pre-admission baseline Toileting: Needs assistance Is this a change from baseline?: Pre-admission baseline In/Out Bed: Needs assistance Is this a change from baseline?: Pre-admission baseline Walks in Home: Dependent Is this a change from baseline?: Pre-admission baseline Does the patient have difficulty walking or climbing stairs?: Yes Weakness of Legs: Both Weakness of Arms/Hands: Both  Permission Sought/Granted Permission sought to share  information with : Chartered certified accountant  granted to share information with : Yes, Verbal Permission Granted Permission granted to share info w AGENCY: [facility]  Emotional Assessment Attitude/Demeanor/Rapport: Unable to Assess Affect (typically observed): Unable to Assess Orientation: : Oriented to Self, Oriented to Place, Oriented to  Time, Oriented to Situation Alcohol / Substance Use: Not Applicable  Admission diagnosis:  COPD exacerbation ([place]) [J44.1] Acute on chronic respiratory failure with hypoxemia (HCC) [J96.21] Congestive heart failure, unspecified HF chronicity, unspecified heart failure type [facility]) [I50.9] Patient Active Problem List   Diagnosis Date Noted   Pressure injury of skin 02/27/2021   Acute on chronic respiratory failure with hypoxemia ([place]) 02/26/2021   Bacteremia    Palliative care by specialist    Goals of care, counseling/discussion    General weakness    Sepsis ([place]) 12/03/2020   COVID-19 virus infection 12/03/2020   Acute lower UTI 12/03/2020   Cellulitis 12/03/2020   Chronic diastolic CHF (congestive heart failure) ([place]) 12/03/2020   Hypoalbuminemia 12/03/2020   AKI (acute kidney injury) ([place]) 12/03/2020   Pleural effusion on left 09/12/2020   COPD (chronic obstructive pulmonary disease) ([place]) 09/12/2020   Sick sinus syndrome ([place]) 09/12/2020   Hypoxia 09/12/2020   PCP:  Pcp, No Pharmacy:   [facility] [place], [place] [address] [place] [address] Phone: [phone] Fax: [phone]  Readmission Risk Interventions Readmission Risk Prevention Plan 02/27/2021  Transportation Screening Complete  PCP or Specialist Appt within 3-5 Days Not Complete  Not Complete comments Patient will discharge back to [facility].  [place] or Home Care Consult Not Complete  HRI or Home Care Consult comments Patient will discharge to SNF.  Social Work Consult for [place]  Planning/Counseling [place] Not Applicable

## 2021-02-27 NOTE — Progress Notes (Signed)
PT Cancellation Note  Patient Details Name: Samantha Fitzgerald MRN: [id number] DOB: May 17, 1924   Cancelled Treatment:    Reason Eval/Treat Not Completed: Medical issues which prohibited therapy, on BiPAP. Will check back tomorrow.   Claretha Cooper 02/27/2021, 11:46 AM [place] Pager [phone] Office [phone]

## 2021-02-27 NOTE — Plan of Care (Signed)

## 2021-02-27 NOTE — Progress Notes (Signed)
PROGRESS NOTE    Samantha Fitzgerald  IRC:[id number] DOB: [birth year] DOA: 02/26/2021 PCP: Pcp, No     Brief Narrative:  Samantha Fitzgerald is a 87 y.o. female with medical history significant of chronic diastolic congestive heart failure, chronic respiratory failure/COPD on 2-4 L nasal cannula baseline, sick sinus syndrome, history of DVT on Eliquis, Hx Lung cancer s/p wedge resection 10 years ago, anxiety who presented to [facility] ED 12/13 via EMS from [facility] after staff found patient more confused with increased work of breathing.  Apparently patient was diagnosed with pneumonia few days ago and started on antibiotics.  On EMS arrival, patient was noted to be hypoxic with SPO2 in the 80s on her normal oxygen and was placed on nonrebreather and transported to the ED for further evaluation. Patient was given albuterol neb, Decadron 8 mg IV x1 and Lasix 20 mg IV x1.  Hospitalist service was consulted for further evaluation management of acute on chronic respiratory failure secondary to pneumonia versus COPD exacerbation versus decompensated diastolic congestive heart failure exacerbation.  New events last 24 hours / Subjective: Patient examined this morning.  Patient was alert and oriented.  She was a poor historian overall, denied any shortness of breath, but then remembered that she was short of breath at SNF.  She admits to a dry cough.  Denies any chest pain.  Around 10:30 AM, due to continued hypoxia, patient was placed on BiPAP.  Patient was reexamined with daughter at bedside around 1:30 PM.  Patient remained comfortable on BiPAP, satting 100%.  Daughter states that patient was initially hospitalized in May 2022 in [place].  Hospitalization was followed by 24-month stay at SNF, then transition to ALF.  However, patient had hospitalization due to UTI, had has been back at [facility] since.  Assessment & Plan:   Principal Problem:   Acute on chronic respiratory failure with  hypoxemia (HCC) Active Problems:   Pleural effusion on left   COPD (chronic obstructive pulmonary disease) (HCC)   Sick sinus syndrome (HCC)   Hypoxia   Chronic diastolic CHF (congestive heart failure) (HCC)   General weakness   Pressure injury of skin   Acute metabolic encephalopathy -Likely multifactorial due to respiratory failure, COPD, pneumonia -Continue to monitor  Acute on chronic hypoxemic, hypercapnic respiratory failure -At baseline requires 2 to 4 L nasal cannula O2 -Currently on BiPAP -Patient is a DNR, confirmed with daughter today  COPD exacerbation -Continue Solu-Medrol and breathing treatments, azithromycin  Community-acquired pneumonia -WBC improving.  Continue Rocephin, azithromycin  Acute on chronic diastolic CHF exacerbation -Echo 12/08/2020 showed EF 60 to 65% -Continue IV Lasix, strict I's and O's, daily weight  Left pleural effusion -IR consulted for thoracentesis  Sick sinus syndrome -Continue flecainide, digoxin  History of DVT -Eliquis on hold due to thoracentesis     In agreement with assessment of the pressure ulcer as below:  Pressure Injury 02/27/21 Sacrum Medial Stage 1 -  Intact skin with non-blanchable redness of a localized area usually over a bony prominence. (Active)  02/27/21 0200  Location: Sacrum  Location Orientation: Medial  Staging: Stage 1 -  Intact skin with non-blanchable redness of a localized area usually over a bony prominence.  Wound Description (Comments):   Present on Admission: Yes         DVT prophylaxis: Eliquis on hold due to thoracentesis SCDs Start: 02/27/21 0732  Code Status: DNR, confirmed with daughter at bedside Family Communication: Daughter Disposition Plan:  Status is: Inpatient  Remains inpatient appropriate because: Respiratory failure requiring BiPAP, remains on IV Solu-Medrol, IV antibiotics, IV furosemide    Antimicrobials:  Anti-infectives (From admission, onward)    Start      Dose/Rate Route Frequency Ordered Stop   02/27/21 0900  cefTRIAXone (ROCEPHIN) 2 g in sodium chloride 0.9 % 100 mL IVPB        2 g 200 mL/hr over 30 Minutes Intravenous Every 24 hours 02/27/21 0731 03/04/21 0859   02/27/21 0731  azithromycin (ZITHROMAX) 500 mg in sodium chloride 0.9 % 250 mL IVPB        500 mg 250 mL/hr over 60 Minutes Intravenous Every 24 hours 02/27/21 0731 03/04/21 0829        Objective: Vitals:   02/27/21 1146 02/27/21 1200 02/27/21 1300 02/27/21 1326  BP:  (!) 136/52 (!) 137/53   Pulse: 78 84 81   Resp: 17 (!) 21 (!) 23   Temp:  97.6 F (36.4 C)    TempSrc:  Oral    SpO2: 99% 95% 96% 98%    Intake/Output Summary (Last 24 hours) at 02/27/2021 1351 Last data filed at 02/27/2021 1229 Gross per 24 hour  Intake 333.72 ml  Output --  Net 333.72 ml   There were no vitals filed for this visit.  Examination:  General exam: Appears calm and comfortable  Respiratory system: Diminished breath sounds bilaterally, respiratory effort is normal without distress Cardiovascular system: S1 & S2 heard, RRR. No murmurs. No pedal edema. Gastrointestinal system: Abdomen is nondistended, soft and nontender. Normal bowel sounds heard. Central nervous system: Alert and oriented. No focal neurological deficits. Speech clear.  Extremities: Symmetric in appearance  Skin: No rashes, lesions or ulcers on exposed skin  Psychiatry: Judgement and insight appear poor, poor historian as well. Mood & affect appropriate.   Data Reviewed: I have personally reviewed following labs and imaging studies  CBC: Recent Labs  Lab 02/26/21 1603 02/27/21 0756  WBC 26.4* 19.7*  NEUTROABS 24.7*  --   HGB 10.7* 10.9*  HCT 34.0* 32.9*  MCV 95.5 91.9  PLT 415* 176   Basic Metabolic Panel: Recent Labs  Lab 02/26/21 1603 02/27/21 0756  NA 132* 133*  K 4.2 4.7  CL 89* 90*  CO2 37* 31  GLUCOSE 159* 111*  BUN 19 21  CREATININE 0.66 0.64  CALCIUM 8.3* 8.5*   GFR: CrCl cannot be  calculated (Unknown ideal weight.). Liver Function Tests: Recent Labs  Lab 02/26/21 1603 02/27/21 0756  AST 18  --   ALT 13  --   ALKPHOS 83  --   BILITOT 0.5  --   PROT 6.4* 6.2*  ALBUMIN 2.9*  --    No results for input(s): LIPASE, AMYLASE in the last 168 hours. No results for input(s): AMMONIA in the last 168 hours. Coagulation Profile: Recent Labs  Lab 02/26/21 1603  INR 1.5*   Cardiac Enzymes: No results for input(s): CKTOTAL, CKMB, CKMBINDEX, TROPONINI in the last 168 hours. BNP (last 3 results) No results for input(s): PROBNP in the last 8760 hours. HbA1C: No results for input(s): HGBA1C in the last 72 hours. CBG: Recent Labs  Lab 02/27/21 0358  GLUCAP 123*   Lipid Profile: No results for input(s): CHOL, HDL, LDLCALC, TRIG, CHOLHDL, LDLDIRECT in the last 72 hours. Thyroid Function Tests: No results for input(s): TSH, T4TOTAL, FREET4, T3FREE, THYROIDAB in the last 72 hours. Anemia Panel: No results for input(s): VITAMINB12, FOLATE, FERRITIN, TIBC, IRON, RETICCTPCT in the last 72 hours. Sepsis Labs:  Recent Labs  Lab 02/26/21 1603 02/26/21 1803 02/27/21 0756  PROCALCITON  --   --  2.12  LATICACIDVEN 1.0 1.2  --     Recent Results (from the past 240 hour(s))  Culture, blood (Routine x 2)     Status: None (Preliminary result)   Collection Time: 02/26/21  4:03 PM   Specimen: BLOOD  Result Value Ref Range Status   Specimen Description   Final    BLOOD RIGHT ANTECUBITAL Performed at [facility] [address], [place], [place] [address]    Special Requests   Final    BOTTLES DRAWN AEROBIC AND ANAEROBIC Blood Culture results may not be optimal due to an inadequate volume of blood received in culture bottles Performed at [facility] [address], [place], [place] [address]    Culture   Final    NO GROWTH < 24 HOURS Performed at [facility] [address], [place], [place] [address]    Report Status  PENDING  Incomplete  Culture, blood (Routine x 2)     Status: None (Preliminary result)   Collection Time: 02/26/21  4:08 PM   Specimen: BLOOD  Result Value Ref Range Status   Specimen Description   Final    BLOOD LEFT ANTECUBITAL Performed at [facility] [address], [place], [place] [address]    Special Requests   Final    BOTTLES DRAWN AEROBIC AND ANAEROBIC Blood Culture results may not be optimal due to an excessive volume of blood received in culture bottles Performed at [facility] [address] [place], [place] [address]    Culture   Final    NO GROWTH < 24 HOURS Performed at [facility] [address], [place], [place] [address]    Report Status PENDING  Incomplete  Resp Panel by RT-PCR (Flu A&B, Covid) Nasopharyngeal Swab     Status: None   Collection Time: 02/26/21  4:25 PM   Specimen: Nasopharyngeal Swab; Nasopharyngeal(NP) swabs in vial transport medium  Result Value Ref Range Status   SARS Coronavirus 2 by RT PCR NEGATIVE NEGATIVE Final    Comment: (NOTE) SARS-CoV-2 target nucleic acids are NOT DETECTED.  The SARS-CoV-2 RNA is generally detectable in upper respiratory specimens during the acute phase of infection. The lowest concentration of SARS-CoV-2 viral copies this assay can detect is 138 copies/mL. A negative result does not preclude SARS-Cov-2 infection and should not be used as the sole basis for treatment or other patient management decisions. A negative result may occur with  improper specimen collection/handling, submission of specimen other than nasopharyngeal swab, presence of viral mutation(s) within the areas targeted by this assay, and inadequate number of viral copies(<138 copies/mL). A negative result must be combined with clinical observations, patient history, and epidemiological information. The expected result is Negative.  Fact Sheet for Patients:   EntrepreneurPulse.com.au  Fact Sheet for Healthcare Providers:  IncredibleEmployment.be  This test is no t yet approved or cleared by the [place] FDA and  has been authorized for detection and/or diagnosis of SARS-CoV-2 by FDA under an Emergency Use Authorization (EUA). This EUA will remain  in effect (meaning this test can be used) for the duration of the COVID-19 declaration under Section 564(b)(1) of the Act, 21 U.S.C.section 360bbb-3(b)(1), unless the authorization is terminated  or revoked sooner.       Influenza A by PCR NEGATIVE NEGATIVE Final   Influenza B by PCR NEGATIVE  NEGATIVE Final    Comment: (NOTE) The Xpert Xpress SARS-CoV-2/FLU/RSV plus assay is intended as an aid in the diagnosis of influenza from Nasopharyngeal swab specimens and should not be used as a sole basis for treatment. Nasal washings and aspirates are unacceptable for Xpert Xpress SARS-CoV-2/FLU/RSV testing.  Fact Sheet for Patients: EntrepreneurPulse.com.au  Fact Sheet for Healthcare Providers: IncredibleEmployment.be  This test is not yet approved or cleared by the [place] FDA and has been authorized for detection and/or diagnosis of SARS-CoV-2 by FDA under an Emergency Use Authorization (EUA). This EUA will remain in effect (meaning this test can be used) for the duration of the COVID-19 declaration under Section 564(b)(1) of the Act, 21 U.S.C. section 360bbb-3(b)(1), unless the authorization is terminated or revoked.  Performed at [facility], [place] [address], [place], [place] [address]   MRSA Next Gen by PCR, Nasal     Status: None   Collection Time: 02/27/21  2:24 AM   Specimen: Nasal Mucosa; Nasal Swab  Result Value Ref Range Status   MRSA by PCR Next Gen NOT DETECTED NOT DETECTED Final    Comment: (NOTE) The GeneXpert MRSA Assay (FDA approved for NASAL specimens only), is one  component of a comprehensive MRSA colonization surveillance program. It is not intended to diagnose MRSA infection nor to guide or monitor treatment for MRSA infections. Test performance is not FDA approved in patients less than 76 years old. Performed at [facility], [place] [address], [place], [place] [address]       Radiology Studies: DG Chest Port 1 View  Result Date: 02/26/2021 CLINICAL DATA:  Shortness of breath, hypoxia, recently diagnosed with pneumonia, rhonchi EXAM: PORTABLE CHEST 1 VIEW COMPARISON:  Portable exam 1630 hours compared to 01/29/2021 FINDINGS: Normal heart size, mediastinal contours, and pulmonary vascularity. Atherosclerotic calcification aorta. Persistent LEFT pleural effusion and basilar atelectasis. Underlying emphysematous changes. Decreased atelectasis and effusion at RIGHT base. Upper lungs clear. And no definite acute infiltrate or pneumothorax. Bones demineralized. IMPRESSION: COPD changes with persistent LEFT basilar effusion and atelectasis. Decreased diffusion and atelectasis at RIGHT base. No new abnormalities. Aortic Atherosclerosis (ICD10-I70.0) and Emphysema (ICD10-J43.9). Electronically Signed   By: Lavonia Dana M.D.   On: 02/26/2021 16:55      Scheduled Meds:  arformoterol  15 mcg Nebulization BID   budesonide (PULMICORT) nebulizer solution  0.5 mg Nebulization BID   Chlorhexidine Gluconate Cloth  6 each Topical Daily   digoxin  0.125 mg Oral Daily   flecainide  75 mg Oral BID   furosemide  40 mg Intravenous BID   ipratropium-albuterol  3 mL Nebulization Q6H   mouth rinse  15 mL Mouth Rinse BID   methylPREDNISolone (SOLU-MEDROL) injection  60 mg Intravenous Q12H   tamsulosin  0.4 mg Oral QPC breakfast   Continuous Infusions:  azithromycin Stopped (02/27/21 0939)   cefTRIAXone (ROCEPHIN)  IV Stopped (02/27/21 1108)     LOS: 1 day      Time spent: 40 minutes   Dessa Phi, DO [facility] 02/27/2021, 1:51 PM    Available via Epic secure chat 7am-7pm After these hours, please refer to coverage provider listed on [email or web address]

## 2021-02-28 LAB — CBC
HCT: 31.7 % — ABNORMAL LOW (ref 36.0–46.0)
Hemoglobin: 10.1 g/dL — ABNORMAL LOW (ref 12.0–15.0)
MCH: 30.1 pg (ref 26.0–34.0)
MCHC: 31.9 g/dL (ref 30.0–36.0)
MCV: 94.6 fL (ref 80.0–100.0)
Platelets: 388 10*3/uL (ref 150–400)
RBC: 3.35 MIL/uL — ABNORMAL LOW (ref 3.87–5.11)
RDW: 14.9 % (ref 11.5–15.5)
WBC: 14.4 10*3/uL — ABNORMAL HIGH (ref 4.0–10.5)
nRBC: 0 % (ref 0.0–0.2)

## 2021-02-28 LAB — BASIC METABOLIC PANEL
Anion gap: 10 (ref 5–15)
BUN: 33 mg/dL — ABNORMAL HIGH (ref 8–23)
CO2: 33 mmol/L — ABNORMAL HIGH (ref 22–32)
Calcium: 8.1 mg/dL — ABNORMAL LOW (ref 8.9–10.3)
Chloride: 90 mmol/L — ABNORMAL LOW (ref 98–111)
Creatinine, Ser: 0.88 mg/dL (ref 0.44–1.00)
GFR, Estimated: >60 mL/min (ref >60)
Glucose, Bld: 122 mg/dL — ABNORMAL HIGH (ref 70–99)
Potassium: 4.2 mmol/L (ref 3.5–5.1)
Sodium: 133 mmol/L — ABNORMAL LOW (ref 135–145)

## 2021-02-28 LAB — URINE CULTURE: Culture: <10000 — AB

## 2021-02-28 LAB — PROCALCITONIN: Procalcitonin: 1.91 ng/mL

## 2021-02-28 MED ORDER — METHYLPREDNISOLONE SODIUM SUCC 125 MG IJ SOLR
60.0000 mg | Freq: Every day | INTRAMUSCULAR | Status: DC
Start: 2021-02-28 — End: 2021-03-01
  Administered 2021-02-28: 60 mg via INTRAVENOUS
  Filled 2021-02-28: qty 2

## 2021-02-28 MED ORDER — APIXABAN 5 MG PO TABS
5.0000 mg | ORAL_TABLET | Freq: Two times a day (BID) | ORAL | Status: DC
  Administered 2021-02-28 – 2021-03-02 (×5): 5 mg via ORAL
  Filled 2021-02-28 (×5): qty 1

## 2021-02-28 MED ORDER — FUROSEMIDE 40 MG PO TABS
40.0000 mg | ORAL_TABLET | Freq: Every day | ORAL | Status: DC
  Administered 2021-02-28 – 2021-03-02 (×3): 40 mg via ORAL
  Filled 2021-02-28 (×3): qty 1

## 2021-02-28 MED ORDER — AZITHROMYCIN 250 MG PO TABS
500.0000 mg | ORAL_TABLET | Freq: Every day | ORAL | Status: DC
  Administered 2021-03-01 – 2021-03-02 (×2): 500 mg via ORAL
  Filled 2021-02-28 (×2): qty 2

## 2021-02-28 NOTE — Progress Notes (Signed)
PHARMACIST - PHYSICIAN COMMUNICATION  CONCERNING: Antibiotic IV to Oral Route Change Policy  RECOMMENDATION: This patient is receiving azithromycin by the intravenous route.  Based on criteria approved by the Pharmacy and Therapeutics Committee, the antibiotic(s) is/are being converted to the equivalent oral dose form(s).   DESCRIPTION: These criteria include:  Patient being treated for a respiratory tract infection, urinary tract infection, cellulitis or clostridium difficile associated diarrhea if on metronidazole  The patient is not neutropenic and does not exhibit a GI malabsorption state  The patient is eating (either orally or via tube) and/or has been taking other orally administered medications for a least 24 hours  The patient is improving clinically and has a Tmax < 100.5  If you have questions about this conversion, please contact the Pharmacy Department  []  ( [phone] )  [place] []  ( [phone] )  [facility] []  ( [phone] )  [place] []  ( [phone] )  [facility] [x]  ( [phone] )  [facility]  

## 2021-02-28 NOTE — Progress Notes (Signed)
Patient said that she is " depressed and sad." I asked her if she would like some medicine to help her sleep and she replied " yes." Prn ativan given as prescribed.

## 2021-02-28 NOTE — Evaluation (Signed)
Physical Therapy Evaluation Patient Details Name: Samantha Fitzgerald MRN: [id number] DOB: 03/15/1925 Today's Date: 02/28/2021  History of Present Illness  Patient is 87 y.o. female with PMH for COPD on 2 - 4L nasal cannula, sick sinus syndrome, CHF admitted for shortness of breath and confusion.  Found to have left-sided pleural effusion (s/p thoracentesis 28/41/32) PNA, metabolic encephalopathy, COPD exac, CHF exac, and acute on chronic respiratory failure.  Hx of recent COVID-19 positive and Severe Sepsis secondary to UTI  Clinical Impression  Pt admitted as above and presenting with functional mobility limitations 2* generalized weakness, balance deficits and limited endurance with c/o SOB with min exertion on 4-6L.  Pt would benefit from follow up rehab at SNF level to maximize IND and safety prior to return home.     Recommendations for follow up therapy are one component of a multi-disciplinary discharge planning process, led by the attending physician.  Recommendations may be updated based on patient status, additional functional criteria and insurance authorization.  Follow Up Recommendations Skilled nursing-short term rehab (<3 hours/day)    Assistance Recommended at Discharge Frequent or constant Supervision/Assistance  Functional Status Assessment Patient has had a recent decline in their functional status and demonstrates the ability to make significant improvements in function in a reasonable and predictable amount of time.  Equipment Recommendations  None recommended by PT    Recommendations for Other Services       Precautions / Restrictions Precautions Precautions: Fall Precaution Comments: monitor sats Restrictions Weight Bearing Restrictions: No      Mobility  Bed Mobility Overal bed mobility: Needs Assistance Bed Mobility: Supine to Sit     Supine to sit: Mod assist     General bed mobility comments: Mod assist to bring trunk to upright and to complete  rotation to EOB sitting using bed pad    Transfers Overall transfer level: Needs assistance Equipment used: Rolling walker (2 wheels) Transfers: Sit to/from Stand Sit to Stand: Mod assist           General transfer comment: cues for use of UEs to self assist.  Physical assist to block feet, to bring wt up and fwd and to balance in standing with RW    Ambulation/Gait Ambulation/Gait assistance: Min assist;Mod assist;+2 safety/equipment Gait Distance (Feet): 5 Feet Assistive device: Rolling walker (2 wheels) Gait Pattern/deviations: Step-to pattern;Decreased step length - right;Decreased step length - left;Shuffle;Trunk flexed Gait velocity: decr     General Gait Details: min cues for position from RW and basic safety awareness,  Distance ltd by fatigue and reports of SOB - 94% on 6L  Stairs            Wheelchair Mobility    Modified Rankin (Stroke Patients Only)       Balance Overall balance assessment: Needs assistance Sitting-balance support: No upper extremity supported;Feet supported Sitting balance-Leahy Scale: Good     Standing balance support: Bilateral upper extremity supported Standing balance-Leahy Scale: Poor                               Pertinent Vitals/Pain Pain Assessment: No/denies pain    Home Living Family/patient expects to be discharged to:: Skilled nursing facility                   Additional Comments: Pt has been at [facility] for rehab.    Prior Function Prior Level of Function : Needs assist  Mobility Comments: Pt had been walking up to 59' with RW "until she started getting sick"  per dtr       Hand Dominance   Dominant Hand: Right    Extremity/Trunk Assessment   Upper Extremity Assessment Upper Extremity Assessment: Generalized weakness    Lower Extremity Assessment Lower Extremity Assessment: Generalized weakness    Cervical / Trunk Assessment Cervical / Trunk  Assessment: Kyphotic  Communication   Communication: HOH  Cognition Arousal/Alertness: Awake/alert Behavior During Therapy: WFL for tasks assessed/performed Overall Cognitive Status: Within Functional Limits for tasks assessed                                          General Comments      Exercises     Assessment/Plan    PT Assessment Patient needs continued PT services  PT Problem List Decreased strength;Decreased activity tolerance;Decreased balance;Decreased mobility;Decreased knowledge of use of DME;Decreased safety awareness       PT Treatment Interventions DME instruction;Gait training;Stair training;Functional mobility training;Therapeutic activities;Therapeutic exercise;Patient/family education;Balance training    PT Goals (Current goals can be found in the Care Plan section)  Acute Rehab PT Goals Patient Stated Goal: Regain IND PT Goal Formulation: With patient Time For Goal Achievement: 03/14/21 Potential to Achieve Goals: Fair    Frequency Min 3X/week   Barriers to discharge        Co-evaluation PT/OT/SLP Co-Evaluation/Treatment: Yes Reason for Co-Treatment: To address functional/ADL transfers;For patient/therapist safety PT goals addressed during session: Mobility/safety with mobility OT goals addressed during session: ADL's and self-care       AM-PAC PT "6 Clicks" Mobility  Outcome Measure Help needed turning from your back to your side while in a flat bed without using bedrails?: A Little Help needed moving from lying on your back to sitting on the side of a flat bed without using bedrails?: A Lot Help needed moving to and from a bed to a chair (including a wheelchair)?: A Lot Help needed standing up from a chair using your arms (e.g., wheelchair or bedside chair)?: A Lot Help needed to walk in hospital room?: Total Help needed climbing 3-5 steps with a railing? : Total 6 Click Score: 11    End of Session Equipment Utilized During  Treatment: Gait belt;Oxygen Activity Tolerance: Patient limited by fatigue Patient left: in chair;with call bell/phone within reach;with chair alarm set;with family/visitor present Nurse Communication: Mobility status PT Visit Diagnosis: Difficulty in walking, not elsewhere classified (R26.2);Muscle weakness (generalized) (M62.81)    Time: 9628-3662 PT Time Calculation (min) (ACUTE ONLY): 21 min   Charges:   PT Evaluation $PT Eval Low Complexity: 1 Low          [place] Pager [phone] Office [phone]   Anaissa Macfadden 02/28/2021, 12:56 PM

## 2021-02-28 NOTE — Evaluation (Addendum)
Occupational Therapy Evaluation Patient Details Name: Samantha Fitzgerald MRN: [id number] DOB: 05/19/1924 Today's Date: 02/28/2021   History of Present Illness Patient is 87 y.o. female with PMH for COPD on 2 - 4L nasal cannula, sick sinus syndrome, CHF admitted for shortness of breath and confusion.  Found to have left-sided pleural effusion (s/p thoracentesis 95/62/13) PNA, metabolic encephalopathy, COPD exac, CHF exac, and acute on chronic respiratory failure.  Hx of recent COVID-19 positive and Severe Sepsis secondary to UTI   Clinical Impression   Mrs. Samantha Fitzgerald is a 87 year old woman who presents with generalized weakness, decreased activity tolerance, impaired balance and cardiopulmonary endurance currently on 4 L HFNC. On evaluation she was mod assist to stand, min assist to take steps to recliner with RW and needing max assist for LB ADLs and total assist for toileting and setup to min assist UB ADLs. Patient complained of feeling short of breath after sitting up and o2 increased to 6 L. HR slightly elevated but vital signs WFL. Patient will benefit from skilled OT services while in hospital to improve deficits and learn compensatory strategies as needed in order to return to PLOF. Recommend return to facility at discharge.        Recommendations for follow up therapy are one component of a multi-disciplinary discharge planning process, led by the attending physician.  Recommendations may be updated based on patient status, additional functional criteria and insurance authorization.   Follow Up Recommendations  Skilled nursing-short term rehab (<3 hours/day)    Assistance Recommended at Discharge Frequent or constant Supervision/Assistance  Functional Status Assessment  Patient has had a recent decline in their functional status and demonstrates the ability to make significant improvements in function in a reasonable and predictable amount of time.  Equipment Recommendations   None recommended by OT    Recommendations for Other Services       Precautions / Restrictions Precautions Precautions: Fall Precaution Comments: monitor sats Restrictions Weight Bearing Restrictions: No      Mobility Bed Mobility Overal bed mobility: Needs Assistance Bed Mobility: Supine to Sit     Supine to sit: Mod assist     General bed mobility comments: Mod assist to bring trunk to upright and to complete rotation to EOB sitting using bed pad    Transfers Overall transfer level: Needs assistance   Transfers: Sit to/from Stand Sit to Stand: Mod assist           General transfer comment: cues for use of UEs to self assist.  Physical assist to block feet, to bring wt up and fwd and to balance in standing with RW      Balance Overall balance assessment: Needs assistance Sitting-balance support: No upper extremity supported;Feet supported Sitting balance-Leahy Scale: Good     Standing balance support: Bilateral upper extremity supported Standing balance-Leahy Scale: Poor                             ADL either performed or assessed with clinical judgement   ADL Overall ADL's : Needs assistance/impaired Eating/Feeding: Set up;Sitting   Grooming: Set up;Sitting   Upper Body Bathing: Set up;Sitting   Lower Body Bathing: Maximal assistance;Sit to/from stand   Upper Body Dressing : Minimal assistance;Sitting   Lower Body Dressing: Maximal assistance;Sit to/from stand   Toilet Transfer: Moderate assistance;Rolling walker (2 wheels);BSC/3in1   Toileting- Clothing Manipulation and Hygiene: Maximal assistance;Sitting/lateral lean;Sit to/from stand  Vision Patient Visual Report: No change from baseline       Perception     Praxis      Pertinent Vitals/Pain Pain Assessment: No/denies pain     Hand Dominance Right   Extremity/Trunk Assessment Upper Extremity Assessment Upper Extremity Assessment: RUE  deficits/detail;LUE deficits/detail RUE Deficits / Details: WFL ROM, grossly 4/5 strength throughout except grip 4-/5 RUE Sensation: WNL RUE Coordination: WNL LUE Deficits / Details: WFL ROM, grossly 4/5 strength throughout except grip 4-/5 LUE Sensation: WNL LUE Coordination: WNL   Lower Extremity Assessment Lower Extremity Assessment: Defer to PT evaluation   Cervical / Trunk Assessment Cervical / Trunk Assessment: Kyphotic   Communication Communication Communication: HOH   Cognition   Behavior During Therapy: WFL for tasks assessed/performed Overall Cognitive Status: Within Functional Limits for tasks assessed                                 General Comments: Appears to have some short term memory loss. Reliant on daughter to answer PLOF questions.     General Comments       Exercises     Shoulder Instructions      Home Living Family/patient expects to be discharged to:: Skilled nursing facility                                 Additional Comments: Pt has been at [facility] for rehab.      Prior Functioning/Environment Prior Level of Function : Needs assist             Mobility Comments: Pt had been walking up to 58' with RW "until she started getting sick"  per dtr          OT Problem List: Decreased strength;Decreased activity tolerance;Decreased safety awareness;Decreased cognition;Impaired balance (sitting and/or standing);Decreased knowledge of use of DME or AE;Cardiopulmonary status limiting activity      OT Treatment/Interventions: Self-care/ADL training;Therapeutic exercise;DME and/or AE instruction;Therapeutic activities;Balance training;Patient/family education    OT Goals(Current goals can be found in the care plan section) Acute Rehab OT Goals Patient Stated Goal: get stronger and return to rehab OT Goal Formulation: With family Time For Goal Achievement: 03/14/21 Potential to Achieve Goals: Good  OT  Frequency: Min 2X/week   Barriers to D/C:            Co-evaluation PT/OT/SLP Co-Evaluation/Treatment: Yes Reason for Co-Treatment: To address functional/ADL transfers          AM-PAC OT "6 Clicks" Daily Activity     Outcome Measure Help from another person eating meals?: A Little Help from another person taking care of personal grooming?: A Little Help from another person toileting, which includes using toliet, bedpan, or urinal?: A Lot Help from another person bathing (including washing, rinsing, drying)?: A Lot Help from another person to put on and taking off regular upper body clothing?: A Little Help from another person to put on and taking off regular lower body clothing?: A Lot 6 Click Score: 15   End of Session Equipment Utilized During Treatment: Rolling walker (2 wheels);Gait belt Nurse Communication: Mobility status  Activity Tolerance: Patient tolerated treatment well Patient left: in chair;with call bell/phone within reach;with chair alarm set;with family/visitor present  OT Visit Diagnosis: Unsteadiness on feet (R26.81);Muscle weakness (generalized) (M62.81)  Time: 2111-7356 OT Time Calculation (min): 20 min Charges:  OT General Charges $OT Visit: 1 Visit OT Evaluation $OT Eval Low Complexity: 1 Low  Caleen Taaffe, OTR/L [place]  Office [phone] Pager: [place] 02/28/2021, 4:20 PM

## 2021-02-28 NOTE — Progress Notes (Signed)
PROGRESS NOTE    [name]  IDP:[id number] DOB: Jul 24, 1924 DOA: 02/26/2021 PCP: Pcp, No     Brief Narrative:  Samantha Fitzgerald is a 87 y.o. female with medical history significant of chronic diastolic congestive heart failure, chronic respiratory failure/COPD on 2-4 L nasal cannula baseline, sick sinus syndrome, history of DVT on Eliquis, Hx Lung cancer s/p wedge resection 10 years ago, anxiety who presented to [facility] ED 12/13 via EMS from [facility] after staff found patient more confused with increased work of breathing.  Apparently patient was diagnosed with pneumonia few days ago and started on antibiotics.  On EMS arrival, patient was noted to be hypoxic with SPO2 in the 80s on her normal oxygen and was placed on nonrebreather and transported to the ED for further evaluation. Patient was given albuterol neb, Decadron 8 mg IV x1 and Lasix 20 mg IV x1.  Hospitalist service was consulted for further evaluation management of acute on chronic respiratory failure secondary to pneumonia versus COPD exacerbation versus decompensated diastolic congestive heart failure exacerbation.  New events last 24 hours / Subjective: Patient has been weaned to 3 L oxygen this morning.  She denies any complaints, states that she feels much better after thoracentesis yesterday.  Denies any chest pain.  Assessment & Plan:   Principal Problem:   Acute on chronic respiratory failure with hypoxemia (HCC) Active Problems:   Pleural effusion on left   COPD (chronic obstructive pulmonary disease) (HCC)   Sick sinus syndrome (HCC)   Hypoxia   Chronic diastolic CHF (congestive heart failure) (HCC)   General weakness   Pressure injury of skin   Acute metabolic encephalopathy -Likely multifactorial due to respiratory failure, COPD, pneumonia -Continue to monitor, seems to be back to her baseline  Acute on chronic hypoxemic, hypercapnic respiratory failure -At baseline requires 2 to 4 L  nasal cannula O2 -Patient is a DNR, confirmed with daughter  -Currently on 3 L oxygen  COPD exacerbation -Continue Solu-Medrol and breathing treatments, azithromycin  Community-acquired pneumonia -WBC improving.  Continue Rocephin, azithromycin  Acute on chronic diastolic CHF exacerbation -Echo 12/08/2020 showed EF 60 to 65% -Continue PO  Lasix, strict I's and O's, daily weight  Left pleural effusion -Status post thoracentesis with 650 mL clear yellow fluid removed 12/14.  By lights criteria, exudative in nature  Sick sinus syndrome -Continue flecainide, digoxin  History of DVT -Resume Eliquis    In agreement with assessment of the pressure ulcer as below:  Pressure Injury 02/27/21 Sacrum Medial Stage 1 -  Intact skin with non-blanchable redness of a localized area usually over a bony prominence. (Active)  02/27/21 0200  Location: Sacrum  Location Orientation: Medial  Staging: Stage 1 -  Intact skin with non-blanchable redness of a localized area usually over a bony prominence.  Wound Description (Comments):   Present on Admission: Yes         DVT prophylaxis: Eliquis  SCDs Start: 02/27/21 0732  Code Status: DNR, confirmed with daughter at bedside Family Communication: Daughter updated today Disposition Plan:  Status is: Inpatient  Remains inpatient appropriate because: Remains on IV Solu-Medrol, IV antibiotics   Antimicrobials:  Anti-infectives (From admission, onward)    Start     Dose/Rate Route Frequency Ordered Stop   02/27/21 0900  cefTRIAXone (ROCEPHIN) 2 g in sodium chloride 0.9 % 100 mL IVPB        2 g 200 mL/hr over 30 Minutes Intravenous Every 24 hours 02/27/21 0731 03/04/21 0859  02/27/21 0731  azithromycin (ZITHROMAX) 500 mg in sodium chloride 0.9 % 250 mL IVPB        500 mg 250 mL/hr over 60 Minutes Intravenous Every 24 hours 02/27/21 0731 03/04/21 0829        Objective: Vitals:   02/28/21 0718 02/28/21 0720 02/28/21 0900 02/28/21 1000   BP:   (!) 151/58 (!) 126/48  Pulse: 74  83 84  Resp: 19  (!) 22 20  Temp:  97.8 F (36.6 C)    TempSrc:  Oral    SpO2: 99% 100% 98% 93%  Weight:        Intake/Output Summary (Last 24 hours) at 02/28/2021 1039 Last data filed at 02/28/2021 1037 Gross per 24 hour  Intake 537.98 ml  Output 120 ml  Net 417.98 ml    Filed Weights   02/28/21 0500  Weight: 49.2 kg    Examination:  General exam: Appears calm and comfortable  Respiratory system: Clear to auscultation bilaterally, respiratory effort is normal without distress, no conversational dyspnea Cardiovascular system: S1 & S2 heard, RRR. No murmurs. No pedal edema. Gastrointestinal system: Abdomen is nondistended, soft and nontender. Normal bowel sounds heard. Central nervous system: Alert and oriented. No focal neurological deficits. Speech clear.  Extremities: Symmetric in appearance  Skin: No rashes, lesions or ulcers on exposed skin  Psychiatry: Judgement and insight appear poor, poor historian as well. Mood & affect appropriate.   Data Reviewed: I have personally reviewed following labs and imaging studies  CBC: Recent Labs  Lab 02/26/21 1603 02/27/21 0756 02/28/21 0247  WBC 26.4* 19.7* 14.4*  NEUTROABS 24.7*  --   --   HGB 10.7* 10.9* 10.1*  HCT 34.0* 32.9* 31.7*  MCV 95.5 91.9 94.6  PLT 415* 376 622    Basic Metabolic Panel: Recent Labs  Lab 02/26/21 1603 02/27/21 0756 02/28/21 0247  NA 132* 133* 133*  K 4.2 4.7 4.2  CL 89* 90* 90*  CO2 37* 31 33*  GLUCOSE 159* 111* 122*  BUN 19 21 33*  CREATININE 0.66 0.64 0.88  CALCIUM 8.3* 8.5* 8.1*    GFR: Estimated Creatinine Clearance: 30.4 mL/min (by C-G formula based on SCr of 0.88 mg/dL). Liver Function Tests: Recent Labs  Lab 02/26/21 1603 02/27/21 0756  AST 18  --   ALT 13  --   ALKPHOS 83  --   BILITOT 0.5  --   PROT 6.4* 6.2*  ALBUMIN 2.9*  --     No results for input(s): LIPASE, AMYLASE in the last 168 hours. No results for input(s):  AMMONIA in the last 168 hours. Coagulation Profile: Recent Labs  Lab 02/26/21 1603  INR 1.5*    Cardiac Enzymes: No results for input(s): CKTOTAL, CKMB, CKMBINDEX, TROPONINI in the last 168 hours. BNP (last 3 results) No results for input(s): PROBNP in the last 8760 hours. HbA1C: No results for input(s): HGBA1C in the last 72 hours. CBG: Recent Labs  Lab 02/27/21 0358  GLUCAP 123*    Lipid Profile: No results for input(s): CHOL, HDL, LDLCALC, TRIG, CHOLHDL, LDLDIRECT in the last 72 hours. Thyroid Function Tests: No results for input(s): TSH, T4TOTAL, FREET4, T3FREE, THYROIDAB in the last 72 hours. Anemia Panel: No results for input(s): VITAMINB12, FOLATE, FERRITIN, TIBC, IRON, RETICCTPCT in the last 72 hours. Sepsis Labs: Recent Labs  Lab 02/26/21 1603 02/26/21 1803 02/27/21 0756 02/28/21 0247  PROCALCITON  --   --  2.12 1.91  LATICACIDVEN 1.0 1.2  --   --  Recent Results (from the past 240 hour(s))  Culture, blood (Routine x 2)     Status: None (Preliminary result)   Collection Time: 02/26/21  4:03 PM   Specimen: BLOOD  Result Value Ref Range Status   Specimen Description   Final    BLOOD RIGHT ANTECUBITAL Performed at [facility] [address] [place], [place] [address]    Special Requests   Final    BOTTLES DRAWN AEROBIC AND ANAEROBIC Blood Culture results may not be optimal due to an inadequate volume of blood received in culture bottles Performed at [facility] [address] [place], [place] [address]    Culture   Final    NO GROWTH 2 DAYS Performed at [facility] [address] [place], [place] [address]    Report Status PENDING  Incomplete  Culture, blood (Routine x 2)     Status: None (Preliminary result)   Collection Time: 02/26/21  4:08 PM   Specimen: BLOOD  Result Value Ref Range Status   Specimen Description   Final    BLOOD LEFT ANTECUBITAL Performed at [facility] [address], [place], [place] [address]    Special Requests   Final    BOTTLES DRAWN AEROBIC AND ANAEROBIC Blood Culture results may not be optimal due to an excessive volume of blood received in culture bottles Performed at [facility] [address], [place], [place] [address]    Culture   Final    NO GROWTH 2 DAYS Performed at [facility] [address] [place], [place] [address]    Report Status PENDING  Incomplete  Resp Panel by RT-PCR (Flu A&B, Covid) Nasopharyngeal Swab     Status: None   Collection Time: 02/26/21  4:25 PM   Specimen: Nasopharyngeal Swab; Nasopharyngeal(NP) swabs in vial transport medium  Result Value Ref Range Status   SARS Coronavirus 2 by RT PCR NEGATIVE NEGATIVE Final    Comment: (NOTE) SARS-CoV-2 target nucleic acids are NOT DETECTED.  The SARS-CoV-2 RNA is generally detectable in upper respiratory specimens during the acute phase of infection. The lowest concentration of SARS-CoV-2 viral copies this assay can detect is 138 copies/mL. A negative result does not preclude SARS-Cov-2 infection and should not be used as the sole basis for treatment or other patient management decisions. A negative result may occur with  improper specimen collection/handling, submission of specimen other than nasopharyngeal swab, presence of viral mutation(s) within the areas targeted by this assay, and inadequate number of viral copies(<138 copies/mL). A negative result must be combined with clinical observations, patient history, and epidemiological information. The expected result is Negative.  Fact Sheet for Patients:  EntrepreneurPulse.com.au  Fact Sheet for Healthcare Providers:  IncredibleEmployment.be  This test is no t yet approved or cleared by the [place] FDA and  has been authorized for detection and/or diagnosis of SARS-CoV-2 by FDA under an Emergency Use Authorization  (EUA). This EUA will remain  in effect (meaning this test can be used) for the duration of the COVID-19 declaration under Section 564(b)(1) of the Act, 21 U.S.C.section 360bbb-3(b)(1), unless the authorization is terminated  or revoked sooner.       Influenza A by PCR NEGATIVE NEGATIVE Final   Influenza B by PCR NEGATIVE NEGATIVE Final    Comment: (NOTE) The Xpert Xpress SARS-CoV-2/FLU/RSV plus assay is intended as an aid in the diagnosis of influenza from Nasopharyngeal swab specimens and should not be  used as a sole basis for treatment. Nasal washings and aspirates are unacceptable for Xpert Xpress SARS-CoV-2/FLU/RSV testing.  Fact Sheet for Patients: EntrepreneurPulse.com.au  Fact Sheet for Healthcare Providers: IncredibleEmployment.be  This test is not yet approved or cleared by the [place] FDA and has been authorized for detection and/or diagnosis of SARS-CoV-2 by FDA under an Emergency Use Authorization (EUA). This EUA will remain in effect (meaning this test can be used) for the duration of the COVID-19 declaration under Section 564(b)(1) of the Act, 21 U.S.C. section 360bbb-3(b)(1), unless the authorization is terminated or revoked.  Performed at [facility], [place] [address], [place], [place] [address]   MRSA Next Gen by PCR, Nasal     Status: None   Collection Time: 02/27/21  2:24 AM   Specimen: Nasal Mucosa; Nasal Swab  Result Value Ref Range Status   MRSA by PCR Next Gen NOT DETECTED NOT DETECTED Final    Comment: (NOTE) The GeneXpert MRSA Assay (FDA approved for NASAL specimens only), is one component of a comprehensive MRSA colonization surveillance program. It is not intended to diagnose MRSA infection nor to guide or monitor treatment for MRSA infections. Test performance is not FDA approved in patients less than 62 years old. Performed at [facility], [place] [address] [place], [place] [address]   Body fluid culture w Gram Stain     Status: None (Preliminary result)   Collection Time: 02/27/21  4:26 PM   Specimen: PATH Cytology Pleural fluid  Result Value Ref Range Status   Specimen Description   Final    PLEURAL Performed at [facility] [address] [place], [place] [address]    Special Requests   Final    NONE Performed at [facility], [place] [address], [place], [place] [address]    Gram Stain   Final    NO SQUAMOUS EPITHELIAL CELLS SEEN FEW WBC SEEN NO ORGANISMS SEEN    Culture   Final    NO GROWTH < 24 HOURS Performed at [facility], [place] [address] [place],  [address]    Report Status PENDING  Incomplete       Radiology Studies: DG Chest Port 1 View  Result Date: 02/27/2021 CLINICAL DATA:  Post left-sided thoracentesis EXAM: PORTABLE CHEST 1 VIEW COMPARISON:  02/26/2021; 01/29/2021 FINDINGS: Grossly unchanged cardiac silhouette and mediastinal contours with atherosclerotic plaque within the aortic arch and descending thoracic aorta. Stable postoperative change of the right hilum. Interval reduction in persistent small layering left-sided effusion post thoracentesis. No pneumothorax. Improved aeration of left lung base with persistent left basilar opacities. Unchanged small layering right-sided effusion and associated right basilar opacities. No new focal airspace opacities. Pulmonary vasculature remains indistinct with cephalization of flow. No acute osseous abnormalities. IMPRESSION: 1. Interval reduction in persistent trace left-sided effusion post thoracentesis. No pneumothorax. 2. Improved aeration of the left lung base with otherwise similar findings of pulmonary edema, small layering right-sided effusion and associated right basilar opacities, likely atelectasis. Electronically Signed   By: Sandi Mariscal M.D.   On: 02/27/2021 17:04   DG Chest Port 1 View  Result Date:  02/26/2021 CLINICAL DATA:  Shortness of breath, hypoxia, recently diagnosed with pneumonia, rhonchi EXAM: PORTABLE CHEST 1 VIEW COMPARISON:  Portable exam 1630 hours compared to 01/29/2021 FINDINGS: Normal heart size, mediastinal contours, and pulmonary vascularity. Atherosclerotic calcification aorta. Persistent LEFT pleural effusion and basilar atelectasis. Underlying emphysematous changes. Decreased atelectasis and effusion at RIGHT  base. Upper lungs clear. And no definite acute infiltrate or pneumothorax. Bones demineralized. IMPRESSION: COPD changes with persistent LEFT basilar effusion and atelectasis. Decreased diffusion and atelectasis at RIGHT base. No new abnormalities. Aortic Atherosclerosis (ICD10-I70.0) and Emphysema (ICD10-J43.9). Electronically Signed   By: Lavonia Dana M.D.   On: 02/26/2021 16:55   US THORACENTESIS ASP PLEURAL SPACE W/IMG GUIDE  Result Date: 02/27/2021 INDICATION: Patient with history of COPD, congestive failure, recurrent bilateral pleural effusions. Request to IR for diagnostic and therapeutic thoracentesis. EXAM: ULTRASOUND GUIDED LEFT THORACENTESIS MEDICATIONS: 10 mL 1% lidocaine COMPLICATIONS: None immediate. PROCEDURE: An ultrasound guided thoracentesis was thoroughly discussed with the patient and questions answered. The benefits, risks, alternatives and complications were also discussed. The patient understands and wishes to proceed with the procedure. Written consent was obtained. Ultrasound was performed to localize and mark an adequate pocket of fluid in the left chest. The area was then prepped and draped in the normal sterile fashion. 1% Lidocaine was used for local anesthesia. Under ultrasound guidance a 6 Fr Safe-T-Centesis catheter was introduced. Thoracentesis was performed. The catheter was removed and a dressing applied. FINDINGS: A total of approximately 650 mL of clear yellow fluid was removed. Samples were sent to the laboratory as requested by the clinical  team. IMPRESSION: Successful ultrasound guided left thoracentesis yielding 650 mL of pleural fluid. Read by Candiss Norse, PA-C Electronically Signed   By: Sandi Mariscal M.D.   On: 02/27/2021 17:02      Scheduled Meds:  arformoterol  15 mcg Nebulization BID   budesonide (PULMICORT) nebulizer solution  0.5 mg Nebulization BID   Chlorhexidine Gluconate Cloth  6 each Topical Daily   digoxin  0.125 mg Oral Daily   flecainide  75 mg Oral BID   furosemide  40 mg Oral Daily   ipratropium-albuterol  3 mL Nebulization TID   mouth rinse  15 mL Mouth Rinse BID   methylPREDNISolone (SOLU-MEDROL) injection  60 mg Intravenous Daily   tamsulosin  0.4 mg Oral QPC breakfast   Continuous Infusions:  azithromycin Stopped (02/28/21 0954)   cefTRIAXone (ROCEPHIN)  IV Stopped (02/28/21 1030)     LOS: 2 days      Time spent: 25 minutes   Dessa Phi, DO [facility] 02/28/2021, 10:39 AM   Available via Epic secure chat 7am-7pm After these hours, please refer to coverage provider listed on [email or web address]

## 2021-03-01 LAB — CBC
HCT: 33.5 % — ABNORMAL LOW (ref 36.0–46.0)
Hemoglobin: 10.5 g/dL — ABNORMAL LOW (ref 12.0–15.0)
MCH: 29.7 pg (ref 26.0–34.0)
MCHC: 31.3 g/dL (ref 30.0–36.0)
MCV: 94.6 fL (ref 80.0–100.0)
Platelets: 432 10*3/uL — ABNORMAL HIGH (ref 150–400)
RBC: 3.54 MIL/uL — ABNORMAL LOW (ref 3.87–5.11)
RDW: 14.6 % (ref 11.5–15.5)
WBC: 7.5 10*3/uL (ref 4.0–10.5)
nRBC: 0 % (ref 0.0–0.2)

## 2021-03-01 LAB — BASIC METABOLIC PANEL
Anion gap: 10 (ref 5–15)
BUN: 41 mg/dL — ABNORMAL HIGH (ref 8–23)
CO2: 34 mmol/L — ABNORMAL HIGH (ref 22–32)
Calcium: 8.5 mg/dL — ABNORMAL LOW (ref 8.9–10.3)
Chloride: 93 mmol/L — ABNORMAL LOW (ref 98–111)
Creatinine, Ser: 0.71 mg/dL (ref 0.44–1.00)
GFR, Estimated: >60 mL/min (ref >60)
Glucose, Bld: 124 mg/dL — ABNORMAL HIGH (ref 70–99)
Potassium: 3.7 mmol/L (ref 3.5–5.1)
Sodium: 137 mmol/L (ref 135–145)

## 2021-03-01 LAB — CYTOLOGY - NON PAP

## 2021-03-01 LAB — PROCALCITONIN: Procalcitonin: 0.81 ng/mL

## 2021-03-01 MED ORDER — SERTRALINE HCL 50 MG PO TABS
25.0000 mg | ORAL_TABLET | Freq: Every day | ORAL | Status: DC
  Administered 2021-03-01 – 2021-03-02 (×2): 25 mg via ORAL
  Filled 2021-03-01 (×2): qty 1

## 2021-03-01 MED ORDER — HYDRALAZINE HCL 10 MG PO TABS: 10.0000 mg | ORAL_TABLET | Freq: Four times a day (QID) | ORAL | Status: DC | PRN

## 2021-03-01 MED ORDER — PREDNISONE 20 MG PO TABS
50.0000 mg | ORAL_TABLET | Freq: Every day | ORAL | Status: DC
  Administered 2021-03-01 – 2021-03-02 (×2): 50 mg via ORAL
  Filled 2021-03-01 (×2): qty 1

## 2021-03-01 NOTE — TOC Progression Note (Signed)
Transition of Care [facility]) - Progression Note    Patient Details  Name: Nazifa Trinka MRN: [id number] Date of Birth: 18-Mar-1924  Transition of Care [facility] CM/SW [place], [place] Phone Number: 03/01/2021, 1:20 PM  Clinical Narrative:   Felipa Evener from MD that plan is to return patient to [facility] tomorrow.  Confirmed with Vida Roller at [facility], confirmed with daughter.  Requested that MD order COVID test.  TOC will need to call Kelli in AM to confirm return same day. TOC will continue to follow during the course of hospitalization.     Expected Discharge Plan: [place] Barriers to Discharge: Barriers Resolved  Expected Discharge Plan and Services Expected Discharge Plan: [place] In-house Referral: Clinical Social Work   Post Acute Care Choice: [place] Living arrangements for the past 2 months: [place]                 DME Arranged: N/A DME Agency: NA                   Social Determinants of Health (SDOH) Interventions    Readmission Risk Interventions Readmission Risk Prevention Plan 02/27/2021  Transportation Screening Complete  PCP or Specialist Appt within 3-5 Days Not Complete  Not Complete comments Patient will discharge back to [facility].  [place] or Home Care Consult Not Complete  HRI or Home Care Consult comments Patient will discharge to SNF.  Social Work Consult for [place] Planning/Counseling [place] Not Applicable

## 2021-03-01 NOTE — NC FL2 (Signed)
[place] LEVEL OF CARE SCREENING TOOL     IDENTIFICATION  Patient Name: Samantha Fitzgerald Birthdate: 10-16-1924 Sex: female Admission Date (Current Location): 02/26/2021  [facility] and [place] Number:  Herbalist and Address:  [facility],  [place], [place]      Provider Number: [id number]  Attending Physician Name and Address:  Dessa Phi, DO  Relative Name and Phone Number:  Nehemiah Massed (Daughter)   [phone]    Current Level of Care: Hospital Recommended Level of Care: [place] Prior Approval Number:    Date Approved/Denied:   PASRR Number: [id number]  Discharge Plan: SNF    Current Diagnoses: Patient Active Problem List   Diagnosis Date Noted   Pressure injury of skin 02/27/2021   Acute on chronic respiratory failure with hypoxemia ([place]) 02/26/2021   Bacteremia    Palliative care by specialist    Goals of care, counseling/discussion    General weakness    Sepsis ([place]) 12/03/2020   COVID-19 virus infection 12/03/2020   Acute lower UTI 12/03/2020   Cellulitis 12/03/2020   Chronic diastolic CHF (congestive heart failure) ([place]) 12/03/2020   Hypoalbuminemia 12/03/2020   AKI (acute kidney injury) ([place]) 12/03/2020   Pleural effusion on left 09/12/2020   COPD (chronic obstructive pulmonary disease) ([place]) 09/12/2020   Sick sinus syndrome ([place]) 09/12/2020   Hypoxia 09/12/2020    Orientation RESPIRATION BLADDER Height & Weight     Self, Place  O2 (4L [place]) Incontinent Weight: 49.2 kg Height:  5\' 2"  (157.5 cm)  BEHAVIORAL SYMPTOMS/[place] NEUROLOGICAL BOWEL NUTRITION STATUS      Incontinent Diet (see d/c summary)  AMBULATORY STATUS COMMUNICATION OF NEEDS Skin   Extensive Assist Verbally Other (Comment) (Pressure Injury, Sacrum, medial)                       Personal Care Assistance Level of Assistance  Bathing, Feeding, Dressing Bathing Assistance: Maximum assistance Feeding  assistance: Limited assistance Dressing Assistance: Maximum assistance     Functional Limitations Info  Sight, Hearing, Speech Sight Info: Adequate Hearing Info: Adequate Speech Info: Adequate    SPECIAL CARE FACTORS FREQUENCY  PT (By licensed PT)     PT Frequency: 2-5X/W              Contractures Contractures Info: Not present    Additional Factors Info  Code Status, Allergies Code Status Info: DNR Allergies Info: Amolicillin, Doxycycline, Levofloxacin, Morphine           Current Medications (03/01/2021):  This is the current hospital active medication list Current Facility-Administered Medications  Medication Dose Route Frequency Provider Last Rate Last Admin   acetaminophen (TYLENOL) tablet 650 mg  650 mg Oral Q6H PRN [place] Eric J, DO       Or   acetaminophen (TYLENOL) suppository 650 mg  650 mg Rectal Q6H PRN [place] Eric J, DO       apixaban (ELIQUIS) tablet 5 mg  5 mg Oral BID Dessa Phi, DO   5 mg at 03/01/21 1003   arformoterol (BROVANA) nebulizer solution 15 mcg  15 mcg Nebulization BID [place] Eric J, DO   15 mcg at 03/01/21 5597   azithromycin (ZITHROMAX) tablet 500 mg  500 mg Oral Daily Leodis Sias T, RPH   500 mg at 03/01/21 1004   budesonide (PULMICORT) nebulizer solution 0.5 mg  0.5 mg Nebulization BID [place] Eric J, DO   0.5 mg at 03/01/21 [phone]  cefTRIAXone (ROCEPHIN) 2 g in sodium chloride 0.9 % 100 mL IVPB  2 g Intravenous Q24H [place] Eric J, DO   Stopped at 03/01/21 9629   Chlorhexidine Gluconate Cloth 2 % PADS 6 each  6 each Topical Daily [place] Eric J, DO   6 each at 02/28/21 2157   digoxin (LANOXIN) tablet 0.125 mg  0.125 mg Oral Daily [place] Donnamarie Poag, DO   0.125 mg at 03/01/21 1003   flecainide (TAMBOCOR) tablet 75 mg  75 mg Oral BID [place] Eric J, DO   75 mg at 03/01/21 1005   furosemide (LASIX) tablet 40 mg  40 mg Oral Daily Dessa Phi, DO   40 mg at 03/01/21 1003   hydrALAZINE (APRESOLINE) tablet 10 mg  10 mg Oral Q6H  PRN Dessa Phi, DO       ipratropium-albuterol (DUONEB) 0.5-2.5 (3) MG/3ML nebulizer solution 3 mL  3 mL Nebulization TID Dessa Phi, DO   3 mL at 03/01/21 0819   LORazepam (ATIVAN) injection 0.5 mg  0.5 mg Intravenous Q8H PRN [place] Eric J, DO   0.5 mg at 02/28/21 2157   MEDLINE mouth rinse  15 mL Mouth Rinse BID Dessa Phi, DO   15 mL at 03/01/21 1007   ondansetron (ZOFRAN) tablet 4 mg  4 mg Oral Q6H PRN [place] Donnamarie Poag, DO       Or   ondansetron [facility]) injection 4 mg  4 mg Intravenous Q6H PRN [place] Eric J, DO       polyethylene glycol (MIRALAX / GLYCOLAX) packet 17 g  17 g Oral Daily PRN [place] Eric J, DO       predniSONE (DELTASONE) tablet 50 mg  50 mg Oral Q breakfast Dessa Phi, DO   50 mg at 03/01/21 5284   sertraline (ZOLOFT) tablet 25 mg  25 mg Oral Daily Dessa Phi, DO   25 mg at 03/01/21 1004   tamsulosin (FLOMAX) capsule 0.4 mg  0.4 mg Oral QPC breakfast [place] Eric J, DO   0.4 mg at 03/01/21 1324     Discharge Medications: Please see discharge summary for a list of discharge medications.  Relevant Imaging Results:  Relevant Lab Results:   Additional Information SS#[id number]  [place], [place]

## 2021-03-01 NOTE — Progress Notes (Signed)
PROGRESS NOTE    Samantha Fitzgerald  DXA:[id number] DOB: October 04, 1924 DOA: 02/26/2021 PCP: Pcp, No     Brief Narrative:  Samantha Fitzgerald is a 87 y.o. female with medical history significant of chronic diastolic congestive heart failure, chronic respiratory failure/COPD on 2-4 L nasal cannula baseline, sick sinus syndrome, history of DVT on Eliquis, Hx Lung cancer s/p wedge resection 10 years ago, anxiety who presented to [facility] ED 12/13 via EMS from [facility] after staff found patient more confused with increased work of breathing.  Apparently patient was diagnosed with pneumonia few days ago and started on antibiotics.  On EMS arrival, patient was noted to be hypoxic with SPO2 in the 80s on her normal oxygen and was placed on nonrebreather and transported to the ED for further evaluation. Patient was given albuterol neb, Decadron 8 mg IV x1 and Lasix 20 mg IV x1.  Hospitalist service was consulted for further evaluation management of acute on chronic respiratory failure secondary to pneumonia versus COPD exacerbation versus decompensated diastolic congestive heart failure exacerbation.  New events last 24 hours / Subjective: Patient doing well this morning, remains on 4 L oxygen.  She is resting comfortably in bed, has not needed BiPAP in over 24 hours.  No new complaints today.  Assessment & Plan:   Principal Problem:   Acute on chronic respiratory failure with hypoxemia (HCC) Active Problems:   Pleural effusion on left   COPD (chronic obstructive pulmonary disease) (HCC)   Sick sinus syndrome (HCC)   Hypoxia   Chronic diastolic CHF (congestive heart failure) (HCC)   General weakness   Pressure injury of skin   Acute metabolic encephalopathy -Likely multifactorial due to respiratory failure, COPD, pneumonia -Continue to monitor, seems to be back to her baseline  Acute on chronic hypoxemic, hypercapnic respiratory failure -At baseline requires 2 to 4 L nasal  cannula O2 -Patient is a DNR, confirmed with daughter  -Currently on 4 L oxygen  COPD exacerbation -Continue prednisone and breathing treatments, azithromycin  Community-acquired pneumonia -Leukocytosis resolved, procalcitonin improving.  Continue Rocephin, azithromycin  Acute on chronic diastolic CHF exacerbation -Echo 12/08/2020 showed EF 60 to 65% -Continue PO Lasix, strict I's and O's, daily weight  Left pleural effusion -Status post thoracentesis with 650 mL clear yellow fluid removed 12/14.  By lights criteria, exudative in nature  Sick sinus syndrome -Continue flecainide, digoxin  History of DVT -Continue Eliquis    In agreement with assessment of the pressure ulcer as below:  Pressure Injury 02/27/21 Sacrum Medial Stage 1 -  Intact skin with non-blanchable redness of a localized area usually over a bony prominence. (Active)  02/27/21 0200  Location: Sacrum  Location Orientation: Medial  Staging: Stage 1 -  Intact skin with non-blanchable redness of a localized area usually over a bony prominence.  Wound Description (Comments):   Present on Admission: Yes         DVT prophylaxis:  SCDs Start: 02/27/21 0732 apixaban (ELIQUIS) tablet 5 mg  Code Status: DNR, confirmed with daughter  Family Communication: No family at bedside Disposition Plan:  Status is: Inpatient  Remains inpatient appropriate because: Remains on IV antibiotics.  Likely discharge back to SNF tomorrow  Antimicrobials:  Anti-infectives (From admission, onward)    Start     Dose/Rate Route Frequency Ordered Stop   03/01/21 1000  azithromycin (ZITHROMAX) tablet 500 mg        500 mg Oral Daily 02/28/21 1239 03/04/21 0959   02/27/21 0900  cefTRIAXone (  ROCEPHIN) 2 g in sodium chloride 0.9 % 100 mL IVPB        2 g 200 mL/hr over 30 Minutes Intravenous Every 24 hours 02/27/21 0731 03/04/21 0859   02/27/21 0731  azithromycin (ZITHROMAX) 500 mg in sodium chloride 0.9 % 250 mL IVPB  Status:   Discontinued        500 mg 250 mL/hr over 60 Minutes Intravenous Every 24 hours 02/27/21 0731 02/28/21 1239        Objective: Vitals:   03/01/21 0325 03/01/21 0329 03/01/21 0400 03/01/21 0821  BP:   (!) 187/66   Pulse:   75   Resp:   (!) 21   Temp: (!) 97.4 F (36.3 C) 99.7 F (37.6 C)  98.3 F (36.8 C)  TempSrc: Axillary Oral  Axillary  SpO2:   100% 100%  Weight:      Height:        Intake/Output Summary (Last 24 hours) at 03/01/2021 1151 Last data filed at 02/28/2021 1757 Gross per 24 hour  Intake --  Output 550 ml  Net -550 ml    Filed Weights   02/28/21 0500  Weight: 49.2 kg    Examination:  General exam: Appears calm and comfortable  Respiratory system: Clear to auscultation bilaterally, respiratory effort is normal without distress Cardiovascular system: S1 & S2 heard, RRR. No murmurs. No pedal edema. Gastrointestinal system: Abdomen is nondistended, soft and nontender. Normal bowel sounds heard. Central nervous system: Alert and oriented. No focal neurological deficits. Speech clear.  Extremities: Symmetric in appearance  Skin: No rashes, lesions or ulcers on exposed skin  Psychiatry: Judgement and insight appear stable  Data Reviewed: I have personally reviewed following labs and imaging studies  CBC: Recent Labs  Lab 02/26/21 1603 02/27/21 0756 02/28/21 0247 03/01/21 0241  WBC 26.4* 19.7* 14.4* 7.5  NEUTROABS 24.7*  --   --   --   HGB 10.7* 10.9* 10.1* 10.5*  HCT 34.0* 32.9* 31.7* 33.5*  MCV 95.5 91.9 94.6 94.6  PLT 415* 376 388 432*    Basic Metabolic Panel: Recent Labs  Lab 02/26/21 1603 02/27/21 0756 02/28/21 0247 03/01/21 0241  NA 132* 133* 133* 137  K 4.2 4.7 4.2 3.7  CL 89* 90* 90* 93*  CO2 37* 31 33* 34*  GLUCOSE 159* 111* 122* 124*  BUN 19 21 33* 41*  CREATININE 0.66 0.64 0.88 0.71  CALCIUM 8.3* 8.5* 8.1* 8.5*    GFR: Estimated Creatinine Clearance: 33.4 mL/min (by C-G formula based on SCr of 0.71 mg/dL). Liver  Function Tests: Recent Labs  Lab 02/26/21 1603 02/27/21 0756  AST 18  --   ALT 13  --   ALKPHOS 83  --   BILITOT 0.5  --   PROT 6.4* 6.2*  ALBUMIN 2.9*  --     No results for input(s): LIPASE, AMYLASE in the last 168 hours. No results for input(s): AMMONIA in the last 168 hours. Coagulation Profile: Recent Labs  Lab 02/26/21 1603  INR 1.5*    Cardiac Enzymes: No results for input(s): CKTOTAL, CKMB, CKMBINDEX, TROPONINI in the last 168 hours. BNP (last 3 results) No results for input(s): PROBNP in the last 8760 hours. HbA1C: No results for input(s): HGBA1C in the last 72 hours. CBG: Recent Labs  Lab 02/27/21 0358  GLUCAP 123*    Lipid Profile: No results for input(s): CHOL, HDL, LDLCALC, TRIG, CHOLHDL, LDLDIRECT in the last 72 hours. Thyroid Function Tests: No results for input(s): TSH, T4TOTAL, FREET4, T3FREE,  THYROIDAB in the last 72 hours. Anemia Panel: No results for input(s): VITAMINB12, FOLATE, FERRITIN, TIBC, IRON, RETICCTPCT in the last 72 hours. Sepsis Labs: Recent Labs  Lab 02/26/21 1603 02/26/21 1803 02/27/21 0756 02/28/21 0247 03/01/21 0241  PROCALCITON  --   --  2.12 1.91 0.81  LATICACIDVEN 1.0 1.2  --   --   --      Recent Results (from the past 240 hour(s))  Culture, blood (Routine x 2)     Status: None (Preliminary result)   Collection Time: 02/26/21  4:03 PM   Specimen: BLOOD  Result Value Ref Range Status   Specimen Description   Final    BLOOD RIGHT ANTECUBITAL Performed at [facility], [place] [address] [place], [place] [address]    Special Requests   Final    BOTTLES DRAWN AEROBIC AND ANAEROBIC Blood Culture results may not be optimal due to an inadequate volume of blood received in culture bottles Performed at [facility] [address], [place], [place] [address]    Culture   Final    NO GROWTH 3 DAYS Performed at [facility], [place] [address], [place], [place] [address]    Report  Status PENDING  Incomplete  Culture, blood (Routine x 2)     Status: None (Preliminary result)   Collection Time: 02/26/21  4:08 PM   Specimen: BLOOD  Result Value Ref Range Status   Specimen Description   Final    BLOOD LEFT ANTECUBITAL Performed at [facility] [address] [place], [place] [address]    Special Requests   Final    BOTTLES DRAWN AEROBIC AND ANAEROBIC Blood Culture results may not be optimal due to an excessive volume of blood received in culture bottles Performed at [facility] [address], [place], [place] [address]    Culture   Final    NO GROWTH 3 DAYS Performed at [facility], [place] [address], [place], [place] [address]    Report Status PENDING  Incomplete  Resp Panel by RT-PCR (Flu A&B, Covid) Nasopharyngeal Swab     Status: None   Collection Time: 02/26/21  4:25 PM   Specimen: Nasopharyngeal Swab; Nasopharyngeal(NP) swabs in vial transport medium  Result Value Ref Range Status   SARS Coronavirus 2 by RT PCR NEGATIVE NEGATIVE Final    Comment: (NOTE) SARS-CoV-2 target nucleic acids are NOT DETECTED.  The SARS-CoV-2 RNA is generally detectable in upper respiratory specimens during the acute phase of infection. The lowest concentration of SARS-CoV-2 viral copies this assay can detect is 138 copies/mL. A negative result does not preclude SARS-Cov-2 infection and should not be used as the sole basis for treatment or other patient management decisions. A negative result may occur with  improper specimen collection/handling, submission of specimen other than nasopharyngeal swab, presence of viral mutation(s) within the areas targeted by this assay, and inadequate number of viral copies(<138 copies/mL). A negative result must be combined with clinical observations, patient history, and epidemiological information. The expected result is Negative.  Fact Sheet for Patients:   EntrepreneurPulse.com.au  Fact Sheet for Healthcare Providers:  IncredibleEmployment.be  This test is no t yet approved or cleared by the [place] FDA and  has been authorized for detection and/or diagnosis of SARS-CoV-2 by FDA under an Emergency Use Authorization (EUA). This EUA will remain  in effect (meaning this test can be used) for the duration of the COVID-19 declaration under Section  564(b)(1) of the Act, 21 U.S.C.section 360bbb-3(b)(1), unless the authorization is terminated  or revoked sooner.       Influenza A by PCR NEGATIVE NEGATIVE Final   Influenza B by PCR NEGATIVE NEGATIVE Final    Comment: (NOTE) The Xpert Xpress SARS-CoV-2/FLU/RSV plus assay is intended as an aid in the diagnosis of influenza from Nasopharyngeal swab specimens and should not be used as a sole basis for treatment. Nasal washings and aspirates are unacceptable for Xpert Xpress SARS-CoV-2/FLU/RSV testing.  Fact Sheet for Patients: EntrepreneurPulse.com.au  Fact Sheet for Healthcare Providers: IncredibleEmployment.be  This test is not yet approved or cleared by the [place] FDA and has been authorized for detection and/or diagnosis of SARS-CoV-2 by FDA under an Emergency Use Authorization (EUA). This EUA will remain in effect (meaning this test can be used) for the duration of the COVID-19 declaration under Section 564(b)(1) of the Act, 21 U.S.C. section 360bbb-3(b)(1), unless the authorization is terminated or revoked.  Performed at [facility], [place] [address], [place], [place] [address]   MRSA Next Gen by PCR, Nasal     Status: None   Collection Time: 02/27/21  2:24 AM   Specimen: Nasal Mucosa; Nasal Swab  Result Value Ref Range Status   MRSA by PCR Next Gen NOT DETECTED NOT DETECTED Final    Comment: (NOTE) The GeneXpert MRSA Assay (FDA approved for NASAL specimens only), is one  component of a comprehensive MRSA colonization surveillance program. It is not intended to diagnose MRSA infection nor to guide or monitor treatment for MRSA infections. Test performance is not FDA approved in patients less than 31 years old. Performed at [facility], [place] [address], [place], [place] [address]   Body fluid culture w Gram Stain     Status: None (Preliminary result)   Collection Time: 02/27/21  4:26 PM   Specimen: PATH Cytology Pleural fluid  Result Value Ref Range Status   Specimen Description   Final    PLEURAL Performed at [facility] [address] [place], [place] [address]    Special Requests   Final    NONE Performed at [facility], [place] [address] [place], [place] [address]    Gram Stain   Final    NO SQUAMOUS EPITHELIAL CELLS SEEN FEW WBC SEEN NO ORGANISMS SEEN    Culture   Final    NO GROWTH 2 DAYS Performed at [facility], [place] [address], [place], [place] [address]    Report Status PENDING  Incomplete  Urine Culture     Status: Abnormal   Collection Time: 02/27/21  4:36 PM   Specimen: Urine, Clean Catch  Result Value Ref Range Status   Specimen Description   Final    URINE, CLEAN CATCH Performed at [facility], [place] [address], [place], [place] [address]    Special Requests   Final    NONE Performed at [facility], [place] [address] [place], [place] [address]    Culture (A)  Final    <10,000 COLONIES/mL INSIGNIFICANT GROWTH Performed at [facility] [address], [place], [place] [address]    Report Status 02/28/2021 FINAL  Final       Radiology Studies: DG Chest Port 1 View  Result Date: 02/27/2021 CLINICAL DATA:  Post left-sided thoracentesis EXAM: PORTABLE CHEST 1 VIEW COMPARISON:  02/26/2021; 01/29/2021 FINDINGS: Grossly unchanged cardiac silhouette and mediastinal contours with atherosclerotic plaque within the  aortic  arch and descending thoracic aorta. Stable postoperative change of the right hilum. Interval reduction in persistent small layering left-sided effusion post thoracentesis. No pneumothorax. Improved aeration of left lung base with persistent left basilar opacities. Unchanged small layering right-sided effusion and associated right basilar opacities. No new focal airspace opacities. Pulmonary vasculature remains indistinct with cephalization of flow. No acute osseous abnormalities. IMPRESSION: 1. Interval reduction in persistent trace left-sided effusion post thoracentesis. No pneumothorax. 2. Improved aeration of the left lung base with otherwise similar findings of pulmonary edema, small layering right-sided effusion and associated right basilar opacities, likely atelectasis. Electronically Signed   By: Sandi Mariscal M.D.   On: 02/27/2021 17:04   US THORACENTESIS ASP PLEURAL SPACE W/IMG GUIDE  Result Date: 02/27/2021 INDICATION: Patient with history of COPD, congestive failure, recurrent bilateral pleural effusions. Request to IR for diagnostic and therapeutic thoracentesis. EXAM: ULTRASOUND GUIDED LEFT THORACENTESIS MEDICATIONS: 10 mL 1% lidocaine COMPLICATIONS: None immediate. PROCEDURE: An ultrasound guided thoracentesis was thoroughly discussed with the patient and questions answered. The benefits, risks, alternatives and complications were also discussed. The patient understands and wishes to proceed with the procedure. Written consent was obtained. Ultrasound was performed to localize and mark an adequate pocket of fluid in the left chest. The area was then prepped and draped in the normal sterile fashion. 1% Lidocaine was used for local anesthesia. Under ultrasound guidance a 6 Fr Safe-T-Centesis catheter was introduced. Thoracentesis was performed. The catheter was removed and a dressing applied. FINDINGS: A total of approximately 650 mL of clear yellow fluid was removed. Samples were sent to the  laboratory as requested by the clinical team. IMPRESSION: Successful ultrasound guided left thoracentesis yielding 650 mL of pleural fluid. Read by Candiss Norse, PA-C Electronically Signed   By: Sandi Mariscal M.D.   On: 02/27/2021 17:02      Scheduled Meds:  apixaban  5 mg Oral BID   arformoterol  15 mcg Nebulization BID   azithromycin  500 mg Oral Daily   budesonide (PULMICORT) nebulizer solution  0.5 mg Nebulization BID   Chlorhexidine Gluconate Cloth  6 each Topical Daily   digoxin  0.125 mg Oral Daily   flecainide  75 mg Oral BID   furosemide  40 mg Oral Daily   ipratropium-albuterol  3 mL Nebulization TID   mouth rinse  15 mL Mouth Rinse BID   predniSONE  50 mg Oral Q breakfast   sertraline  25 mg Oral Daily   tamsulosin  0.4 mg Oral QPC breakfast   Continuous Infusions:  cefTRIAXone (ROCEPHIN)  IV Stopped (03/01/21 0857)     LOS: 3 days      Time spent: 25 minutes   Dessa Phi, DO [facility] 03/01/2021, 11:51 AM   Available via Epic secure chat 7am-7pm After these hours, please refer to coverage provider listed on [email or web address]

## 2021-03-02 LAB — RESP PANEL BY RT-PCR (FLU A&B, COVID) ARPGX2
Influenza A by PCR: NEGATIVE
Influenza B by PCR: NEGATIVE
SARS Coronavirus 2 by RT PCR: NEGATIVE

## 2021-03-02 MED ORDER — CEPHALEXIN 500 MG PO CAPS
500.0000 mg | ORAL_CAPSULE | Freq: Four times a day (QID) | ORAL | 0 refills | Status: AC
Start: 2021-03-02 — End: 2021-03-03

## 2021-03-02 MED ORDER — PREDNISONE 10 MG PO TABS: ORAL_TABLET | ORAL | 0 refills | Status: DC

## 2021-03-02 MED ORDER — LORAZEPAM 0.5 MG PO TABS: 0.5000 mg | ORAL_TABLET | Freq: Three times a day (TID) | ORAL | 0 refills | Status: DC | PRN

## 2021-03-02 MED ORDER — AZITHROMYCIN 250 MG PO TABS
500.0000 mg | ORAL_TABLET | Freq: Every day | ORAL | 0 refills | Status: AC
Start: 2021-03-02 — End: 2021-03-03

## 2021-03-02 NOTE — Progress Notes (Signed)
Report called to [facility]. Discharge instructions given to pt daughter, Nehemiah Massed. Pts IV removed.  Pt transferred with PTAR.

## 2021-03-02 NOTE — Progress Notes (Signed)
Patient was stating that she couldn't breath well. She was given a breathing treatment and placed on 8L [place] to maintain saturations of 93%. RT will continue to monitor

## 2021-03-02 NOTE — Discharge Summary (Addendum)
Physician Discharge Summary  Kayelee Herbig YTK:[id number] DOB: October 05, 1924 DOA: 02/26/2021  PCP: No primary care provider on file.  Admit date: 02/26/2021 Discharge date: 03/02/2021  Admitted From: SNF Disposition:  SNF   Discharge Condition: Stable CODE STATUS: DNR   Diet recommendation:  Diet Orders (From admission, onward)     Start     Ordered   02/27/21 1631  Diet Heart Room service appropriate? Yes; Fluid consistency: Thin  Diet effective now       Question Answer Comment  Room service appropriate? Yes   Fluid consistency: Thin      02/27/21 1630           Brief/Interim Summary: Samantha Fitzgerald is a 87 y.o. female with medical history significant of chronic diastolic congestive heart failure, chronic respiratory failure/COPD on 2-4 L nasal cannula baseline, sick sinus syndrome, history of DVT on Eliquis, Hx Lung cancer s/p wedge resection 10 years ago, anxiety who presented to [facility] ED 12/13 via EMS from [facility] after staff found patient more confused with increased work of breathing.  Apparently patient was diagnosed with pneumonia few days ago and started on antibiotics.  On EMS arrival, patient was noted to be hypoxic with SPO2 in the 80s on her normal oxygen and was placed on nonrebreather and transported to the ED for further evaluation. Patient was given albuterol neb, Decadron 8 mg IV x1 and Lasix 20 mg IV x1.  Hospitalist service was consulted for further evaluation management of acute on chronic respiratory failure secondary to pneumonia versus COPD exacerbation versus decompensated diastolic congestive heart failure exacerbation.    Patient was treated with antibiotics, steroids.  She underwent left-sided thoracentesis on 12/14.  She continued to improve and has not required BiPAP in over 2 days.  She is back on her baseline nasal cannula oxygen use.  Her mentation returned back to baseline.  She will complete her antibiotic course at  SNF.  Discharge Diagnoses:  Principal Problem:   Acute on chronic respiratory failure with hypoxemia (HCC) Active Problems:   Pleural effusion on left   COPD (chronic obstructive pulmonary disease) (HCC)   Sick sinus syndrome (HCC)   Hypoxia   Chronic diastolic CHF (congestive heart failure) (HCC)   General weakness   Pressure injury of skin   Acute metabolic encephalopathy -Likely multifactorial due to respiratory failure, COPD, pneumonia -Continue to monitor, seems to be back to her baseline.  Likely has some underlying memory loss as well.  Acute on chronic hypoxemic, hypercapnic respiratory failure -At baseline requires 2 to 4 L nasal cannula O2 -Patient is a DNR, confirmed with daughter   COPD exacerbation -Continue prednisone taper and breathing treatments, azithromycin   Community-acquired pneumonia -Leukocytosis resolved, procalcitonin improved.  Continue Rocephin, azithromycin to complete total 5-day course   Acute on chronic diastolic CHF exacerbation -Echo 12/08/2020 showed EF 60 to 65% -Continue PO Lasix, strict I's and O's, daily weight  Left pleural effusion -Status post thoracentesis with 650 mL clear yellow fluid removed 12/14.  By lights criteria, exudative in nature  Sick sinus syndrome -Continue flecainide, digoxin  History of DVT -Continue Eliquis  In agreement with assessment of the pressure ulcer as below:  Pressure Injury 02/27/21 Sacrum Medial Stage 1 -  Intact skin with non-blanchable redness of a localized area usually over a bony prominence. (Active)  02/27/21 0200  Location: Sacrum  Location Orientation: Medial  Staging: Stage 1 -  Intact skin with non-blanchable redness of a localized area usually  over a bony prominence.  Wound Description (Comments):   Present on Admission: Yes       Discharge Instructions  Discharge Instructions     Increase activity slowly   Complete by: As directed    No wound care   Complete by: As  directed       Allergies as of 03/02/2021       Reactions   Amoxicillin Nausea Only, Other (See Comments)   Listed on MAR   Doxycycline Nausea Only, Other (See Comments)   Listed on MAR   Levofloxacin Nausea Only, Other (See Comments)   Not documented on the [facility]   Morphine And Related Nausea Only, Other (See Comments)   Listed on MAR        Medication List     TAKE these medications    albuterol 108 (90 Base) MCG/ACT inhaler Commonly known as: VENTOLIN HFA Inhale 2 puffs into the lungs See admin instructions. Inhale 2 puffs by mouth twice daily. May inhale 2 puffs by mouth every 4 hours as needed for wheezing.   apixaban 5 MG Tabs tablet Commonly known as: ELIQUIS Take 1 tablet (5 mg total) by mouth 2 (two) times daily.   azithromycin 250 MG tablet Commonly known as: ZITHROMAX Take 2 tablets (500 mg total) by mouth daily for 1 day.   cephALEXin 500 MG capsule Commonly known as: KEFLEX Take 1 capsule (500 mg total) by mouth 4 (four) times daily for 1 day.   digoxin 0.125 MG tablet Commonly known as: LANOXIN Take 0.125 mg by mouth daily. Hold and notify MD if HR less than 60   Ensure Take 237 mLs by mouth 3 (three) times daily between meals.   flecainide 50 MG tablet Commonly known as: TAMBOCOR Take 75 mg by mouth 2 (two) times daily. for Sick Sinus Syndrome   furosemide 20 MG tablet Commonly known as: LASIX Take 40 mg by mouth daily.   levalbuterol 0.63 MG/3ML nebulizer solution Commonly known as: XOPENEX Take 3 mLs by nebulization every 6 (six) hours as needed for wheezing.   LORazepam 0.5 MG tablet Commonly known as: ATIVAN Take 1 tablet (0.5 mg total) by mouth 3 (three) times daily as needed for anxiety. What changed: reasons to take this   multivitamin with minerals tablet Take 1 tablet by mouth daily.   OXYGEN Inhale 2 L into the lungs continuous.   predniSONE 10 MG tablet Commonly known as: DELTASONE Take 4 tabs for 3 days, then 3 tabs for  3 days, then 2 tabs for 3 days, then 1 tab for 3 days, then 1/2 tab for 4 days.   sertraline 25 MG tablet Commonly known as: ZOLOFT Take 25 mg by mouth daily.   tamsulosin 0.4 MG Caps capsule Commonly known as: FLOMAX Take 1 capsule (0.4 mg total) by mouth daily after breakfast.   Trelegy Ellipta 100-62.5-25 MCG/ACT Aepb Generic drug: Fluticasone-Umeclidin-Vilant Take 1 puff by mouth daily.        Allergies  Allergen Reactions   Amoxicillin Nausea Only and Other (See Comments)    Listed on MAR   Doxycycline Nausea Only and Other (See Comments)    Listed on MAR   Levofloxacin Nausea Only and Other (See Comments)    Not documented on the [facility]   Morphine And Related Nausea Only and Other (See Comments)    Listed on MAR    Consultations: None   Procedures/Studies: DG Chest Port 1 View  Result Date: 02/27/2021 CLINICAL DATA:  Post  left-sided thoracentesis EXAM: PORTABLE CHEST 1 VIEW COMPARISON:  02/26/2021; 01/29/2021 FINDINGS: Grossly unchanged cardiac silhouette and mediastinal contours with atherosclerotic plaque within the aortic arch and descending thoracic aorta. Stable postoperative change of the right hilum. Interval reduction in persistent small layering left-sided effusion post thoracentesis. No pneumothorax. Improved aeration of left lung base with persistent left basilar opacities. Unchanged small layering right-sided effusion and associated right basilar opacities. No new focal airspace opacities. Pulmonary vasculature remains indistinct with cephalization of flow. No acute osseous abnormalities. IMPRESSION: 1. Interval reduction in persistent trace left-sided effusion post thoracentesis. No pneumothorax. 2. Improved aeration of the left lung base with otherwise similar findings of pulmonary edema, small layering right-sided effusion and associated right basilar opacities, likely atelectasis. Electronically Signed   By: Sandi Mariscal M.D.   On: 02/27/2021 17:04   DG Chest  Port 1 View  Result Date: 02/26/2021 CLINICAL DATA:  Shortness of breath, hypoxia, recently diagnosed with pneumonia, rhonchi EXAM: PORTABLE CHEST 1 VIEW COMPARISON:  Portable exam 1630 hours compared to 01/29/2021 FINDINGS: Normal heart size, mediastinal contours, and pulmonary vascularity. Atherosclerotic calcification aorta. Persistent LEFT pleural effusion and basilar atelectasis. Underlying emphysematous changes. Decreased atelectasis and effusion at RIGHT base. Upper lungs clear. And no definite acute infiltrate or pneumothorax. Bones demineralized. IMPRESSION: COPD changes with persistent LEFT basilar effusion and atelectasis. Decreased diffusion and atelectasis at RIGHT base. No new abnormalities. Aortic Atherosclerosis (ICD10-I70.0) and Emphysema (ICD10-J43.9). Electronically Signed   By: Lavonia Dana M.D.   On: 02/26/2021 16:55   US THORACENTESIS ASP PLEURAL SPACE W/IMG GUIDE  Result Date: 02/27/2021 INDICATION: Patient with history of COPD, congestive failure, recurrent bilateral pleural effusions. Request to IR for diagnostic and therapeutic thoracentesis. EXAM: ULTRASOUND GUIDED LEFT THORACENTESIS MEDICATIONS: 10 mL 1% lidocaine COMPLICATIONS: None immediate. PROCEDURE: An ultrasound guided thoracentesis was thoroughly discussed with the patient and questions answered. The benefits, risks, alternatives and complications were also discussed. The patient understands and wishes to proceed with the procedure. Written consent was obtained. Ultrasound was performed to localize and mark an adequate pocket of fluid in the left chest. The area was then prepped and draped in the normal sterile fashion. 1% Lidocaine was used for local anesthesia. Under ultrasound guidance a 6 Fr Safe-T-Centesis catheter was introduced. Thoracentesis was performed. The catheter was removed and a dressing applied. FINDINGS: A total of approximately 650 mL of clear yellow fluid was removed. Samples were sent to the laboratory  as requested by the clinical team. IMPRESSION: Successful ultrasound guided left thoracentesis yielding 650 mL of pleural fluid. Read by Candiss Norse, PA-C Electronically Signed   By: Sandi Mariscal M.D.   On: 02/27/2021 17:02      Discharge Exam: Vitals:   03/02/21 1000 03/02/21 1100  BP: (!) 169/61 (!) 163/59  Pulse: 86 80  Resp: (!) 22 18  Temp:    SpO2: 90% 92%    General: Pt is alert, awake, not in acute distress Cardiovascular: RRR, S1/S2 +, no edema Respiratory: Diminished, no wheezing, no rhonchi, no respiratory distress, no conversational dyspnea, on nasal cannula O2 Abdominal: Soft, NT, ND, bowel sounds + Extremities: no edema, no cyanosis Psych: Normal mood and affect, stable judgement and insight, poor historian with likely underlying memory loss    The results of significant diagnostics from this hospitalization (including imaging, microbiology, ancillary and laboratory) are listed below for reference.     Microbiology: Recent Results (from the past 240 hour(s))  Culture, blood (Routine x 2)     Status: None (  Preliminary result)   Collection Time: 02/26/21  4:03 PM   Specimen: BLOOD  Result Value Ref Range Status   Specimen Description   Final    BLOOD RIGHT ANTECUBITAL Performed at [facility] [address], [place], [place] [address]    Special Requests   Final    BOTTLES DRAWN AEROBIC AND ANAEROBIC Blood Culture results may not be optimal due to an inadequate volume of blood received in culture bottles Performed at [facility] [address], [place], [place] [address]    Culture   Final    NO GROWTH 3 DAYS Performed at [facility], [place] [address], [place], [place] [address]    Report Status PENDING  Incomplete  Culture, blood (Routine x 2)     Status: None (Preliminary result)   Collection Time: 02/26/21  4:08 PM   Specimen: BLOOD  Result Value Ref Range Status   Specimen Description   Final     BLOOD LEFT ANTECUBITAL Performed at [facility] [address] [place], [place] [address]    Special Requests   Final    BOTTLES DRAWN AEROBIC AND ANAEROBIC Blood Culture results may not be optimal due to an excessive volume of blood received in culture bottles Performed at [facility] [address] [place], [place] [address]    Culture   Final    NO GROWTH 3 DAYS Performed at [facility], [place] [address] [place],  [address]    Report Status PENDING  Incomplete  Resp Panel by RT-PCR (Flu A&B, Covid) Nasopharyngeal Swab     Status: None   Collection Time: 02/26/21  4:25 PM   Specimen: Nasopharyngeal Swab; Nasopharyngeal(NP) swabs in vial transport medium  Result Value Ref Range Status   SARS Coronavirus 2 by RT PCR NEGATIVE NEGATIVE Final    Comment: (NOTE) SARS-CoV-2 target nucleic acids are NOT DETECTED.  The SARS-CoV-2 RNA is generally detectable in upper respiratory specimens during the acute phase of infection. The lowest concentration of SARS-CoV-2 viral copies this assay can detect is 138 copies/mL. A negative result does not preclude SARS-Cov-2 infection and should not be used as the sole basis for treatment or other patient management decisions. A negative result may occur with  improper specimen collection/handling, submission of specimen other than nasopharyngeal swab, presence of viral mutation(s) within the areas targeted by this assay, and inadequate number of viral copies(<138 copies/mL). A negative result must be combined with clinical observations, patient history, and epidemiological information. The expected result is Negative.  Fact Sheet for Patients:  EntrepreneurPulse.com.au  Fact Sheet for Healthcare Providers:  IncredibleEmployment.be  This test is no t yet approved or cleared by the [place] FDA and  has been authorized for detection and/or diagnosis of  SARS-CoV-2 by FDA under an Emergency Use Authorization (EUA). This EUA will remain  in effect (meaning this test can be used) for the duration of the COVID-19 declaration under Section 564(b)(1) of the Act, 21 U.S.C.section 360bbb-3(b)(1), unless the authorization is terminated  or revoked sooner.       Influenza A by PCR NEGATIVE NEGATIVE Final   Influenza B by PCR NEGATIVE NEGATIVE Final    Comment: (NOTE) The Xpert Xpress SARS-CoV-2/FLU/RSV plus assay is intended as an aid in the diagnosis of influenza from Nasopharyngeal swab specimens and should not be used as a sole basis for treatment. Nasal washings and aspirates are unacceptable for Xpert Xpress SARS-CoV-2/FLU/RSV testing.  Fact Sheet for Patients: EntrepreneurPulse.com.au  Fact Sheet for Healthcare Providers: IncredibleEmployment.be  This test is not yet approved or cleared by the [place] FDA and has been authorized for detection and/or diagnosis of SARS-CoV-2 by FDA under an Emergency Use Authorization (EUA). This EUA will remain in effect (meaning this test can be used) for the duration of the COVID-19 declaration under Section 564(b)(1) of the Act, 21 U.S.C. section 360bbb-3(b)(1), unless the authorization is terminated or revoked.  Performed at [facility], [place] [address] [place], [place] [address]   MRSA Next Gen by PCR, Nasal     Status: None   Collection Time: 02/27/21  2:24 AM   Specimen: Nasal Mucosa; Nasal Swab  Result Value Ref Range Status   MRSA by PCR Next Gen NOT DETECTED NOT DETECTED Final    Comment: (NOTE) The GeneXpert MRSA Assay (FDA approved for NASAL specimens only), is one component of a comprehensive MRSA colonization surveillance program. It is not intended to diagnose MRSA infection nor to guide or monitor treatment for MRSA infections. Test performance is not FDA approved in patients less than 38 years old. Performed at  [facility], [place] [address], [place], [place] [address]   Body fluid culture w Gram Stain     Status: None (Preliminary result)   Collection Time: 02/27/21  4:26 PM   Specimen: PATH Cytology Pleural fluid  Result Value Ref Range Status   Specimen Description   Final    PLEURAL Performed at [facility] [address] [place], [place] [address]    Special Requests   Final    NONE Performed at [facility], [place] [address], [place], [place] [address]    Gram Stain   Final    NO SQUAMOUS EPITHELIAL CELLS SEEN FEW WBC SEEN NO ORGANISMS SEEN    Culture   Final    NO GROWTH 3 DAYS Performed at [facility], [place] [address], [place], [place] [address]    Report Status PENDING  Incomplete  Urine Culture     Status: Abnormal   Collection Time: 02/27/21  4:36 PM   Specimen: Urine, Clean Catch  Result Value Ref Range Status   Specimen Description   Final    URINE, CLEAN CATCH Performed at [facility], [place] [address], [place], [place] [address]    Special Requests   Final    NONE Performed at [facility], [place] [address] [place], [place] [address]    Culture (A)  Final    <10,000 COLONIES/mL INSIGNIFICANT GROWTH Performed at [facility] [address] [place],  [address]    Report Status 02/28/2021 FINAL  Final  Resp Panel by RT-PCR (Flu A&B, Covid) Nasopharyngeal Swab     Status: None   Collection Time: 03/02/21 11:01 AM   Specimen: Nasopharyngeal Swab; Nasopharyngeal(NP) swabs in vial transport medium  Result Value Ref Range Status   SARS Coronavirus 2 by RT PCR NEGATIVE NEGATIVE Final    Comment: (NOTE) SARS-CoV-2 target nucleic acids are NOT DETECTED.  The SARS-CoV-2 RNA is generally detectable in upper respiratory specimens during the acute phase of infection. The lowest concentration of SARS-CoV-2 viral copies this assay can detect is 138 copies/mL. A  negative result does not preclude SARS-Cov-2 infection and should not be used as the sole basis for treatment or other patient management decisions. A negative result may occur with  improper specimen collection/handling, submission of  specimen other than nasopharyngeal swab, presence of viral mutation(s) within the areas targeted by this assay, and inadequate number of viral copies(<138 copies/mL). A negative result must be combined with clinical observations, patient history, and epidemiological information. The expected result is Negative.  Fact Sheet for Patients:  EntrepreneurPulse.com.au  Fact Sheet for Healthcare Providers:  IncredibleEmployment.be  This test is no t yet approved or cleared by the [place] FDA and  has been authorized for detection and/or diagnosis of SARS-CoV-2 by FDA under an Emergency Use Authorization (EUA). This EUA will remain  in effect (meaning this test can be used) for the duration of the COVID-19 declaration under Section 564(b)(1) of the Act, 21 U.S.C.section 360bbb-3(b)(1), unless the authorization is terminated  or revoked sooner.       Influenza A by PCR NEGATIVE NEGATIVE Final   Influenza B by PCR NEGATIVE NEGATIVE Final    Comment: (NOTE) The Xpert Xpress SARS-CoV-2/FLU/RSV plus assay is intended as an aid in the diagnosis of influenza from Nasopharyngeal swab specimens and should not be used as a sole basis for treatment. Nasal washings and aspirates are unacceptable for Xpert Xpress SARS-CoV-2/FLU/RSV testing.  Fact Sheet for Patients: EntrepreneurPulse.com.au  Fact Sheet for Healthcare Providers: IncredibleEmployment.be  This test is not yet approved or cleared by the [place] FDA and has been authorized for detection and/or diagnosis of SARS-CoV-2 by FDA under an Emergency Use Authorization (EUA). This EUA will remain in effect (meaning this test can  be used) for the duration of the COVID-19 declaration under Section 564(b)(1) of the Act, 21 U.S.C. section 360bbb-3(b)(1), unless the authorization is terminated or revoked.  Performed at [facility], [place] [address], [place], [place] [address]      Labs: BNP (last 3 results) Recent Labs    09/15/20 0718 12/03/20 1055 02/26/21 1603  BNP 238.6* 1,334.5* 948.5*   Basic Metabolic Panel: Recent Labs  Lab 02/26/21 1603 02/27/21 0756 02/28/21 0247 03/01/21 0241  NA 132* 133* 133* 137  K 4.2 4.7 4.2 3.7  CL 89* 90* 90* 93*  CO2 37* 31 33* 34*  GLUCOSE 159* 111* 122* 124*  BUN 19 21 33* 41*  CREATININE 0.66 0.64 0.88 0.71  CALCIUM 8.3* 8.5* 8.1* 8.5*   Liver Function Tests: Recent Labs  Lab 02/26/21 1603 02/27/21 0756  AST 18  --   ALT 13  --   ALKPHOS 83  --   BILITOT 0.5  --   PROT 6.4* 6.2*  ALBUMIN 2.9*  --    No results for input(s): LIPASE, AMYLASE in the last 168 hours. No results for input(s): AMMONIA in the last 168 hours. CBC: Recent Labs  Lab 02/26/21 1603 02/27/21 0756 02/28/21 0247 03/01/21 0241  WBC 26.4* 19.7* 14.4* 7.5  NEUTROABS 24.7*  --   --   --   HGB 10.7* 10.9* 10.1* 10.5*  HCT 34.0* 32.9* 31.7* 33.5*  MCV 95.5 91.9 94.6 94.6  PLT 415* 376 388 432*   Cardiac Enzymes: No results for input(s): CKTOTAL, CKMB, CKMBINDEX, TROPONINI in the last 168 hours. BNP: Invalid input(s): POCBNP CBG: Recent Labs  Lab 02/27/21 0358  GLUCAP 123*   D-Dimer No results for input(s): DDIMER in the last 72 hours. Hgb A1c No results for input(s): HGBA1C in the last 72 hours. Lipid Profile No results for input(s): CHOL, HDL, LDLCALC, TRIG, CHOLHDL, LDLDIRECT in the last 72 hours. Thyroid function studies No results for input(s): TSH, T4TOTAL, T3FREE, THYROIDAB in the last 72 hours.  Invalid input(s): FREET3 Anemia work up No results for input(s): VITAMINB12, FOLATE, FERRITIN, TIBC, IRON, RETICCTPCT in the last 72  hours. Urinalysis    Component Value Date/Time   COLORURINE YELLOW 02/26/2021 1603   APPEARANCEUR CLEAR 02/26/2021 1603   LABSPEC 1.009 02/26/2021 1603   PHURINE 5.0 02/26/2021 1603   GLUCOSEU NEGATIVE 02/26/2021 1603   HGBUR NEGATIVE 02/26/2021 1603   BILIRUBINUR NEGATIVE 02/26/2021 1603   KETONESUR NEGATIVE 02/26/2021 1603   PROTEINUR NEGATIVE 02/26/2021 1603   NITRITE NEGATIVE 02/26/2021 1603   LEUKOCYTESUR SMALL (A) 02/26/2021 1603   Sepsis Labs Invalid input(s): PROCALCITONIN,  WBC,  LACTICIDVEN Microbiology Recent Results (from the past 240 hour(s))  Culture, blood (Routine x 2)     Status: None (Preliminary result)   Collection Time: 02/26/21  4:03 PM   Specimen: BLOOD  Result Value Ref Range Status   Specimen Description   Final    BLOOD RIGHT ANTECUBITAL Performed at [facility], [place] [address] [place], [place] [address]    Special Requests   Final    BOTTLES DRAWN AEROBIC AND ANAEROBIC Blood Culture results may not be optimal due to an inadequate volume of blood received in culture bottles Performed at [facility] [address] [place], [place] [address]    Culture   Final    NO GROWTH 3 DAYS Performed at [facility], [place] [address], [place], [place] [address]    Report Status PENDING  Incomplete  Culture, blood (Routine x 2)     Status: None (Preliminary result)   Collection Time: 02/26/21  4:08 PM   Specimen: BLOOD  Result Value Ref Range Status   Specimen Description   Final    BLOOD LEFT ANTECUBITAL Performed at [facility] [address], [place], [place] [address]    Special Requests   Final    BOTTLES DRAWN AEROBIC AND ANAEROBIC Blood Culture results may not be optimal due to an excessive volume of blood received in culture bottles Performed at [facility] [address], [place], [place] [address]    Culture   Final    NO GROWTH 3 DAYS Performed at [facility], [place] [address] [place], [place] [address]    Report Status PENDING  Incomplete  Resp Panel by RT-PCR (Flu A&B, Covid) Nasopharyngeal Swab     Status: None   Collection Time: 02/26/21  4:25 PM   Specimen: Nasopharyngeal Swab; Nasopharyngeal(NP) swabs in vial transport medium  Result Value Ref Range Status   SARS Coronavirus 2 by RT PCR NEGATIVE NEGATIVE Final    Comment: (NOTE) SARS-CoV-2 target nucleic acids are NOT DETECTED.  The SARS-CoV-2 RNA is generally detectable in upper respiratory specimens during the acute phase of infection. The lowest concentration of SARS-CoV-2 viral copies this assay can detect is 138 copies/mL. A negative result does not preclude SARS-Cov-2 infection and should not be used as the sole basis for treatment or other patient management decisions. A negative result may occur with  improper specimen collection/handling, submission of specimen other than nasopharyngeal swab, presence of viral mutation(s) within the areas targeted by this assay, and inadequate number of viral copies(<138 copies/mL). A negative result must be combined with clinical observations, patient history, and epidemiological information. The expected result is Negative.  Fact Sheet for Patients:  EntrepreneurPulse.com.au  Fact Sheet for Healthcare Providers:  IncredibleEmployment.be  This test is no t yet approved or cleared by the [place] and  has been authorized for detection and/or diagnosis of SARS-CoV-2 by FDA under an Emergency Use Authorization (EUA). This EUA will remain  in effect (meaning this test can be used) for the duration of the COVID-19 declaration under Section 564(b)(1) of the Act, 21 U.S.C.section 360bbb-3(b)(1), unless the authorization is terminated  or revoked sooner.       Influenza A by PCR NEGATIVE NEGATIVE Final   Influenza B by PCR NEGATIVE NEGATIVE Final    Comment: (NOTE) The Xpert Xpress  SARS-CoV-2/FLU/RSV plus assay is intended as an aid in the diagnosis of influenza from Nasopharyngeal swab specimens and should not be used as a sole basis for treatment. Nasal washings and aspirates are unacceptable for Xpert Xpress SARS-CoV-2/FLU/RSV testing.  Fact Sheet for Patients: EntrepreneurPulse.com.au  Fact Sheet for Healthcare Providers: IncredibleEmployment.be  This test is not yet approved or cleared by the [place] FDA and has been authorized for detection and/or diagnosis of SARS-CoV-2 by FDA under an Emergency Use Authorization (EUA). This EUA will remain in effect (meaning this test can be used) for the duration of the COVID-19 declaration under Section 564(b)(1) of the Act, 21 U.S.C. section 360bbb-3(b)(1), unless the authorization is terminated or revoked.  Performed at [facility], [place] [address], [place], [place] [address]   MRSA Next Gen by PCR, Nasal     Status: None   Collection Time: 02/27/21  2:24 AM   Specimen: Nasal Mucosa; Nasal Swab  Result Value Ref Range Status   MRSA by PCR Next Gen NOT DETECTED NOT DETECTED Final    Comment: (NOTE) The GeneXpert MRSA Assay (FDA approved for NASAL specimens only), is one component of a comprehensive MRSA colonization surveillance program. It is not intended to diagnose MRSA infection nor to guide or monitor treatment for MRSA infections. Test performance is not FDA approved in patients less than 22 years old. Performed at [facility], [place] [address], [place], [place] [address]   Body fluid culture w Gram Stain     Status: None (Preliminary result)   Collection Time: 02/27/21  4:26 PM   Specimen: PATH Cytology Pleural fluid  Result Value Ref Range Status   Specimen Description   Final    PLEURAL Performed at [place] [address], [place], [place] [address]    Special Requests   Final     NONE Performed at [facility], [place] [address] [place], [place] [address]    Gram Stain   Final    NO SQUAMOUS EPITHELIAL CELLS SEEN FEW WBC SEEN NO ORGANISMS SEEN    Culture   Final    NO GROWTH 3 DAYS Performed at [facility], [place] [address], [place], [place] [address]    Report Status PENDING  Incomplete  Urine Culture     Status: Abnormal   Collection Time: 02/27/21  4:36 PM   Specimen: Urine, Clean Catch  Result Value Ref Range Status   Specimen Description   Final    URINE, CLEAN CATCH Performed at [facility], [place] [address] [place], [place] [address]    Special Requests   Final    NONE Performed at [facility], [place] [address] [place], [place] [address]    Culture (A)  Final    <10,000 COLONIES/mL INSIGNIFICANT GROWTH Performed at [place] [address] [place], [place] [address]    Report Status 02/28/2021 FINAL  Final  Resp Panel by RT-PCR (  Flu A&B, Covid) Nasopharyngeal Swab     Status: None   Collection Time: 03/02/21 11:01 AM   Specimen: Nasopharyngeal Swab; Nasopharyngeal(NP) swabs in vial transport medium  Result Value Ref Range Status   SARS Coronavirus 2 by RT PCR NEGATIVE NEGATIVE Final    Comment: (NOTE) SARS-CoV-2 target nucleic acids are NOT DETECTED.  The SARS-CoV-2 RNA is generally detectable in upper respiratory specimens during the acute phase of infection. The lowest concentration of SARS-CoV-2 viral copies this assay can detect is 138 copies/mL. A negative result does not preclude SARS-Cov-2 infection and should not be used as the sole basis for treatment or other patient management decisions. A negative result may occur with  improper specimen collection/handling, submission of specimen other than nasopharyngeal swab, presence of viral mutation(s) within the areas targeted by this assay, and inadequate number of viral copies(<138 copies/mL). A negative result  must be combined with clinical observations, patient history, and epidemiological information. The expected result is Negative.  Fact Sheet for Patients:  EntrepreneurPulse.com.au  Fact Sheet for Healthcare Providers:  IncredibleEmployment.be  This test is no t yet approved or cleared by the [place] FDA and  has been authorized for detection and/or diagnosis of SARS-CoV-2 by FDA under an Emergency Use Authorization (EUA). This EUA will remain  in effect (meaning this test can be used) for the duration of the COVID-19 declaration under Section 564(b)(1) of the Act, 21 U.S.C.section 360bbb-3(b)(1), unless the authorization is terminated  or revoked sooner.       Influenza A by PCR NEGATIVE NEGATIVE Final   Influenza B by PCR NEGATIVE NEGATIVE Final    Comment: (NOTE) The Xpert Xpress SARS-CoV-2/FLU/RSV plus assay is intended as an aid in the diagnosis of influenza from Nasopharyngeal swab specimens and should not be used as a sole basis for treatment. Nasal washings and aspirates are unacceptable for Xpert Xpress SARS-CoV-2/FLU/RSV testing.  Fact Sheet for Patients: EntrepreneurPulse.com.au  Fact Sheet for Healthcare Providers: IncredibleEmployment.be  This test is not yet approved or cleared by the [place] FDA and has been authorized for detection and/or diagnosis of SARS-CoV-2 by FDA under an Emergency Use Authorization (EUA). This EUA will remain in effect (meaning this test can be used) for the duration of the COVID-19 declaration under Section 564(b)(1) of the Act, 21 U.S.C. section 360bbb-3(b)(1), unless the authorization is terminated or revoked.  Performed at [facility], [place] [address], [place], [place] [address]      Patient was seen and examined on the day of discharge and was found to be in stable condition. Time coordinating discharge: 35 minutes including  assessment and coordination of care, as well as examination of the patient.   SIGNED:  Dessa Phi, DO [facility] 03/02/2021, 12:45 PM

## 2021-03-02 NOTE — TOC Transition Note (Signed)
Transition of Care [facility]) - CM/SW Discharge Note   Patient Details  Name: Samantha Fitzgerald MRN: [id number] Date of Birth: 1924-04-25  Transition of Care [facility] CM/SW Contact:  Ross Ludwig, LCSW Phone Number: 03/02/2021, 10:53 AM   Clinical Narrative:     Patient to be d/c'ed today to [facility] room 108.  Patient and family agreeable to plans will transport via ems RN to call report [phone].  Patient's daughter is aware that patient is discharging today.     Final next level of care: Skilled Nursing Facility Barriers to Discharge: Barriers Resolved   Patient Goals and CMS Choice Patient states their goals for this hospitalization and ongoing recovery are:: To return back to [facility] SNF CMS Medicare.gov Compare Post Acute Care list provided to:: Patient Represenative (must comment) Choice offered to / list presented to : Adult Children  Discharge Placement   Existing PASRR number confirmed : 03/01/21          Patient [facility] bed at: [facility] Patient to be transferred to facility by: [facility] Name of family member notified: [name] Patient and family notified of of transfer: 03/02/21  Discharge Plan and Services In-house Referral: Clinical Social Work   Post Acute Care Choice: [name]          DME Arranged: N/A DME Agency: NA                  Social Determinants of Health ([place]) Interventions     Readmission Risk Interventions Readmission Risk Prevention Plan 02/27/2021  Transportation Screening Complete  PCP or Specialist Appt within 3-5 Days Not Complete  Not Complete comments Patient will discharge back to [facility].  [place] or Home Care Consult Not Complete  [place] or Home Care Consult comments Patient will discharge to SNF.  Social Work Consult for [place] Planning/Counseling [place] Not Applicable

## 2021-03-03 LAB — CULTURE, BLOOD (ROUTINE X 2)
Culture: NO GROWTH
Culture: NO GROWTH

## 2021-03-03 LAB — BODY FLUID CULTURE W GRAM STAIN
Culture: NO GROWTH
Gram Stain: NONE SEEN

## 2021-05-08 ENCOUNTER — Other Ambulatory Visit: Payer: Self-pay

## 2021-05-08 ENCOUNTER — Emergency Department (HOSPITAL_COMMUNITY): Payer: Medicare Other

## 2021-05-08 ENCOUNTER — Inpatient Hospital Stay (HOSPITAL_COMMUNITY)
Admission: EM | Admit: 2021-05-08 | Discharge: 2021-05-10 | DRG: 190 | Disposition: A | Discharge status: Skilled Nursing Facility | Payer: Medicare Other | Source: Skilled Nursing Facility | Attending: Internal Medicine | Admitting: Internal Medicine

## 2021-05-08 ENCOUNTER — Encounter (HOSPITAL_COMMUNITY): Payer: Self-pay

## 2021-05-08 DIAGNOSIS — Z87891 Personal history of nicotine dependence: Secondary | ICD-10-CM

## 2021-05-08 DIAGNOSIS — Z885 Allergy status to narcotic agent status: Secondary | ICD-10-CM | POA: Diagnosis not present

## 2021-05-08 DIAGNOSIS — Z66 Do not resuscitate: Secondary | ICD-10-CM | POA: Diagnosis present

## 2021-05-08 DIAGNOSIS — Z20822 Contact with and (suspected) exposure to covid-19: Secondary | ICD-10-CM | POA: Diagnosis present

## 2021-05-08 DIAGNOSIS — Z7901 Long term (current) use of anticoagulants: Secondary | ICD-10-CM

## 2021-05-08 DIAGNOSIS — J441 Chronic obstructive pulmonary disease with (acute) exacerbation: Principal | ICD-10-CM

## 2021-05-08 DIAGNOSIS — I5032 Chronic diastolic (congestive) heart failure: Secondary | ICD-10-CM | POA: Diagnosis present

## 2021-05-08 DIAGNOSIS — Z9981 Dependence on supplemental oxygen: Secondary | ICD-10-CM | POA: Diagnosis not present

## 2021-05-08 DIAGNOSIS — J9621 Acute and chronic respiratory failure with hypoxia: Secondary | ICD-10-CM | POA: Diagnosis present

## 2021-05-08 DIAGNOSIS — Z86718 Personal history of other venous thrombosis and embolism: Secondary | ICD-10-CM

## 2021-05-08 DIAGNOSIS — Z881 Allergy status to other antibiotic agents status: Secondary | ICD-10-CM | POA: Diagnosis not present

## 2021-05-08 DIAGNOSIS — Z880 Allergy status to penicillin: Secondary | ICD-10-CM

## 2021-05-08 DIAGNOSIS — Z7951 Long term (current) use of inhaled steroids: Secondary | ICD-10-CM

## 2021-05-08 DIAGNOSIS — I495 Sick sinus syndrome: Secondary | ICD-10-CM | POA: Diagnosis present

## 2021-05-08 DIAGNOSIS — Z79899 Other long term (current) drug therapy: Secondary | ICD-10-CM

## 2021-05-08 DIAGNOSIS — Z85118 Personal history of other malignant neoplasm of bronchus and lung: Secondary | ICD-10-CM | POA: Diagnosis not present

## 2021-05-08 LAB — COMPREHENSIVE METABOLIC PANEL
ALT: 12 U/L (ref 0–44)
AST: 14 U/L — ABNORMAL LOW (ref 15–41)
Albumin: 2.7 g/dL — ABNORMAL LOW (ref 3.5–5.0)
Alkaline Phosphatase: 73 U/L (ref 38–126)
Anion gap: 7 (ref 5–15)
BUN: 27 mg/dL — ABNORMAL HIGH (ref 8–23)
CO2: 30 mmol/L (ref 22–32)
Calcium: 8.3 mg/dL — ABNORMAL LOW (ref 8.9–10.3)
Chloride: 94 mmol/L — ABNORMAL LOW (ref 98–111)
Creatinine, Ser: 0.76 mg/dL (ref 0.44–1.00)
GFR, Estimated: >60 mL/min (ref >60)
Glucose, Bld: 143 mg/dL — ABNORMAL HIGH (ref 70–99)
Potassium: 4.4 mmol/L (ref 3.5–5.1)
Sodium: 131 mmol/L — ABNORMAL LOW (ref 135–145)
Total Bilirubin: 0.3 mg/dL (ref 0.3–1.2)
Total Protein: 6.1 g/dL — ABNORMAL LOW (ref 6.5–8.1)

## 2021-05-08 LAB — RESP PANEL BY RT-PCR (FLU A&B, COVID) ARPGX2
Influenza A by PCR: NEGATIVE
Influenza B by PCR: NEGATIVE
SARS Coronavirus 2 by RT PCR: NEGATIVE

## 2021-05-08 LAB — BLOOD GAS, VENOUS
Acid-Base Excess: 8.6 mmol/L — ABNORMAL HIGH (ref 0.0–2.0)
Bicarbonate: 34.1 mmol/L — ABNORMAL HIGH (ref 20.0–28.0)
O2 Saturation: 94.8 %
Patient temperature: 37
pCO2, Ven: 49 mmHg (ref 44–60)
pH, Ven: 7.45 — ABNORMAL HIGH (ref 7.25–7.43)
pO2, Ven: 69 mmHg — ABNORMAL HIGH (ref 32–45)

## 2021-05-08 LAB — CBC WITH DIFFERENTIAL/PLATELET
Abs Immature Granulocytes: 0.05 10*3/uL (ref 0.00–0.07)
Basophils Absolute: 0.1 10*3/uL (ref 0.0–0.1)
Basophils Relative: 0 %
Eosinophils Absolute: 0.3 10*3/uL (ref 0.0–0.5)
Eosinophils Relative: 2 %
HCT: 28.7 % — ABNORMAL LOW (ref 36.0–46.0)
Hemoglobin: 9.4 g/dL — ABNORMAL LOW (ref 12.0–15.0)
Immature Granulocytes: 0 %
Lymphocytes Relative: 10 %
Lymphs Abs: 1.2 10*3/uL (ref 0.7–4.0)
MCH: 29.8 pg (ref 26.0–34.0)
MCHC: 32.8 g/dL (ref 30.0–36.0)
MCV: 91.1 fL (ref 80.0–100.0)
Monocytes Absolute: 0.8 10*3/uL (ref 0.1–1.0)
Monocytes Relative: 6 %
Neutro Abs: 10.6 10*3/uL — ABNORMAL HIGH (ref 1.7–7.7)
Neutrophils Relative %: 82 %
Platelets: 380 10*3/uL (ref 150–400)
RBC: 3.15 MIL/uL — ABNORMAL LOW (ref 3.87–5.11)
RDW: 13.8 % (ref 11.5–15.5)
WBC: 13 10*3/uL — ABNORMAL HIGH (ref 4.0–10.5)
nRBC: 0 % (ref 0.0–0.2)

## 2021-05-08 LAB — TROPONIN I (HIGH SENSITIVITY): Troponin I (High Sensitivity): 15 ng/L (ref <18)

## 2021-05-08 LAB — I-STAT CHEM 8, ED
BUN: 24 mg/dL — ABNORMAL HIGH (ref 8–23)
Calcium, Ion: 1.1 mmol/L — ABNORMAL LOW (ref 1.15–1.40)
Chloride: 93 mmol/L — ABNORMAL LOW (ref 98–111)
Creatinine, Ser: 0.7 mg/dL (ref 0.44–1.00)
Glucose, Bld: 139 mg/dL — ABNORMAL HIGH (ref 70–99)
HCT: 28 % — ABNORMAL LOW (ref 36.0–46.0)
Hemoglobin: 9.5 g/dL — ABNORMAL LOW (ref 12.0–15.0)
Potassium: 4.4 mmol/L (ref 3.5–5.1)
Sodium: 132 mmol/L — ABNORMAL LOW (ref 135–145)
TCO2: 31 mmol/L (ref 22–32)

## 2021-05-08 MED ORDER — METHYLPREDNISOLONE SODIUM SUCC 125 MG IJ SOLR
125.0000 mg | Freq: Once | INTRAMUSCULAR | Status: AC
  Administered 2021-05-08: 125 mg via INTRAVENOUS
  Filled 2021-05-08: qty 2

## 2021-05-08 MED ORDER — IPRATROPIUM BROMIDE 0.02 % IN SOLN
0.5000 mg | Freq: Once | RESPIRATORY_TRACT | Status: AC
  Administered 2021-05-08: 0.5 mg via RESPIRATORY_TRACT
  Filled 2021-05-08: qty 2.5

## 2021-05-08 MED ORDER — SODIUM CHLORIDE 0.9 % IV SOLN
500.0000 mg | INTRAVENOUS | Status: AC
  Administered 2021-05-09 (×2): 500 mg via INTRAVENOUS
  Filled 2021-05-08 (×2): qty 5

## 2021-05-08 MED ORDER — IPRATROPIUM BROMIDE 0.02 % IN SOLN: 0.5000 mg | RESPIRATORY_TRACT | Status: DC | PRN

## 2021-05-08 MED ORDER — ALBUTEROL SULFATE (2.5 MG/3ML) 0.083% IN NEBU
5.0000 mg | INHALATION_SOLUTION | Freq: Once | RESPIRATORY_TRACT | Status: AC
Start: 2021-05-08 — End: 2021-05-08
  Administered 2021-05-08: 5 mg via RESPIRATORY_TRACT
  Filled 2021-05-08: qty 6

## 2021-05-08 MED ORDER — DEXTROSE 5 % IV SOLN
250.0000 mg | INTRAVENOUS | Status: DC
  Filled 2021-05-08: qty 2.5

## 2021-05-08 MED ORDER — SODIUM CHLORIDE 0.9 % IV SOLN
1.0000 g | INTRAVENOUS | Status: AC
  Administered 2021-05-08 – 2021-05-09 (×2): 1 g via INTRAVENOUS
  Filled 2021-05-08 (×2): qty 10

## 2021-05-08 MED ORDER — LEVALBUTEROL HCL 0.63 MG/3ML IN NEBU: 0.6300 mg | INHALATION_SOLUTION | RESPIRATORY_TRACT | Status: DC | PRN

## 2021-05-08 MED ORDER — METHYLPREDNISOLONE SODIUM SUCC 125 MG IJ SOLR
60.0000 mg | Freq: Every day | INTRAMUSCULAR | Status: DC
  Administered 2021-05-09: 60 mg via INTRAVENOUS
  Filled 2021-05-08: qty 2

## 2021-05-08 NOTE — ED Provider Notes (Signed)
[place] [place] DEPT Provider Note   CSN: [id number] Arrival date & time: 05/08/21  2158     History  Chief Complaint  Patient presents with   Respiratory Distress    Samantha Fitzgerald is a 88 y.o. female here presenting with trouble breathing.  Patient is 5 L nasal cannula at baseline.  Patient was noted to be hypoxic with oxygen in the 80s on 5 L. Patient was put on nonrebreather.  Patient is full code.  Patient was recently admitted for COPD exacerbation.  The history is provided by the patient.      Home Medications Prior to Admission medications   Medication Sig Start Date End Date Taking? Authorizing Provider  albuterol (VENTOLIN HFA) 108 (90 Base) MCG/ACT inhaler Inhale 2 puffs into the lungs See admin instructions. Inhale 2 puffs by mouth twice daily. May inhale 2 puffs by mouth every 4 hours as needed for wheezing.    [provider]  apixaban (ELIQUIS) 5 MG TABS tablet Take 1 tablet (5 mg total) by mouth 2 (two) times daily. 12/14/20   Caren Griffins, MD  digoxin (LANOXIN) 0.125 MG tablet Take 0.125 mg by mouth daily. Hold and notify MD if HR less than 60 04/25/20   [provider]  Ensure (ENSURE) Take 237 mLs by mouth 3 (three) times daily between meals.    [provider]  flecainide (TAMBOCOR) 50 MG tablet Take 75 mg by mouth 2 (two) times daily. for Sick Sinus Syndrome 07/27/20   [provider]  furosemide (LASIX) 20 MG tablet Take 40 mg by mouth daily. 08/05/20   [provider]  levalbuterol Penne Lash) 0.63 MG/3ML nebulizer solution Take 3 mLs by nebulization every 6 (six) hours as needed for wheezing. 07/20/19   [provider]  LORazepam (ATIVAN) 0.5 MG tablet Take 1 tablet (0.5 mg total) by mouth 3 (three) times daily as needed for anxiety. 03/02/21   Dessa Phi, DO  Multiple Vitamins-Minerals (MULTIVITAMIN WITH MINERALS) tablet Take 1 tablet by mouth daily.    [provider]  OXYGEN Inhale 2 L into the lungs continuous.    [provider]  predniSONE (DELTASONE) 10 MG tablet Take 4 tabs for 3 days, then 3 tabs for 3 days, then 2 tabs for 3 days, then 1 tab for 3 days, then 1/2 tab for 4 days. 03/02/21   Dessa Phi, DO  sertraline (ZOLOFT) 25 MG tablet Take 25 mg by mouth daily. 02/03/21   [provider]  tamsulosin (FLOMAX) 0.4 MG CAPS capsule Take 1 capsule (0.4 mg total) by mouth daily after breakfast. 12/14/20   Gherghe, Vella Redhead, MD  TRELEGY ELLIPTA 100-62.5-25 MCG/INH AEPB Take 1 puff by mouth daily. 08/06/20   [provider]      Allergies    Amoxicillin, Doxycycline, Levofloxacin, and Morphine and related    Review of Systems   Review of Systems  Respiratory:  Positive for shortness of breath.   All other systems reviewed and are negative.  Physical Exam Updated Vital Signs BP (!) 147/58    Pulse (!) 108    Temp 98.6 F (37 C) (Oral)    Resp (!) 27    SpO2 95%  Physical Exam Vitals and nursing note reviewed.  Constitutional:      Comments: Tachypneic  HENT:     Head: Normocephalic.     Nose: Nose normal.     Mouth/Throat:     Mouth: Mucous membranes are moist.  Eyes:     Extraocular Movements: Extraocular movements intact.     Pupils: Pupils are equal, round, and reactive to light.  Cardiovascular:     Rate and Rhythm: Normal rate and regular rhythm.     Pulses: Normal pulses.     Heart sounds: Normal heart sounds.  Pulmonary:     Comments: Tachypneic, diminished breath sounds bilaterally Abdominal:     General: Abdomen is flat.     Palpations: Abdomen is soft.  Musculoskeletal:        General: Normal range of motion.     Cervical back: Normal range of motion and neck supple.  Skin:    General: Skin is warm.     Capillary Refill: Capillary refill takes less than 2 seconds.  Neurological:     General: No focal deficit present.     Comments: Slightly demented, ANO x2  Psychiatric:      Comments: Unable     ED Results / Procedures / Treatments   Labs (all labs ordered are listed, but only abnormal results are displayed) Labs Reviewed  CBC WITH DIFFERENTIAL/PLATELET - Abnormal; Notable for the following components:      Result Value   WBC 13.0 (*)    RBC 3.15 (*)    Hemoglobin 9.4 (*)    HCT 28.7 (*)    Neutro Abs 10.6 (*)    All other components within normal limits  RESP PANEL BY RT-PCR (FLU A&B, COVID) ARPGX2  COMPREHENSIVE METABOLIC PANEL  BRAIN NATRIURETIC PEPTIDE  BLOOD GAS, VENOUS  I-STAT CHEM 8, ED  TROPONIN I (HIGH SENSITIVITY)    EKG None  Radiology DG Chest Port 1 View  Result Date: 05/08/2021 CLINICAL DATA:  Shortness of breath with low oxygen saturation. EXAM: PORTABLE CHEST 1 VIEW COMPARISON:  February 27, 2021 FINDINGS: Limited study secondary to patient rotation. Mild to moderate severity left basilar atelectasis and/or infiltrate is seen. A small, predominant stable left pleural effusion is noted. No pneumothorax is identified. The heart size and mediastinal contours are within normal limits. Radiopaque surgical clips are seen overlying the right hilum. There is marked severity calcification of the thoracic aorta. The visualized skeletal structures are unremarkable. IMPRESSION: 1. Mild to moderate severity left basilar atelectasis and/or infiltrate. 2. Small, predominant stable left pleural effusion. Electronically Signed   By: Virgina Norfolk M.D.   On: 05/08/2021 22:36    Procedures Procedures    Medications Ordered in ED Medications  albuterol (PROVENTIL) (2.5 MG/3ML) 0.083% nebulizer solution 5 mg (has no administration in time range)  ipratropium (ATROVENT) nebulizer solution 0.5 mg (has no administration in time range)  methylPREDNISolone sodium succinate (SOLU-MEDROL) 125 mg/2 mL injection 125 mg (has no administration in time range)    ED Course/ Medical Decision Making/ A&P                           Medical Decision  Making Samantha Fitzgerald is a 88 y.o. female here presenting with shortness of breath.  Patient is anticoagulated on Eliquis already.  I think patient likely has COPD exacerbation.  She is on 5 L nasal cannula at baseline and is hypoxic to 80% on her baseline 5 L.  Consider CHF as well.  Also consider COVID.  Plan to get CBC and CMP and BNP and get COVID test and chest x-ray.    11:09 PM VBG reassuring.  Patient given Solu-Medrol and albuterol and Atrovent.  Patient is breathing better now.  At this point, will admit for COPD exacerbation.  COVID test still pending  Problems Addressed: COPD exacerbation ([place]): acute illness or injury  Amount and/or Complexity of Data Reviewed External Data Reviewed: notes. Labs: ordered. Decision-making details documented in ED Course. Radiology: ordered and independent interpretation performed. Decision-making details documented in ED Course. ECG/medicine tests: ordered and independent interpretation performed. Decision-making details documented in ED Course.  Risk Prescription drug management. Decision regarding hospitalization.  Final Clinical Impression(s) / ED Diagnoses Final diagnoses:  None    Rx / DC Orders ED Discharge Orders     None         Drenda Freeze, MD 05/08/21 2321

## 2021-05-08 NOTE — H&P (Signed)
History and Physical    Patient: Samantha Fitzgerald NOM:[id number] DOB: 1924-11-16 DOA: 05/08/2021 DOS: the patient was seen and examined on 05/08/2021 PCP: System, Provider Not In  Patient coming from: SNF  Chief Complaint:  Chief Complaint  Patient presents with   Respiratory Distress    HPI: Samantha Fitzgerald is a 88 y.o. female with medical history significant of COPD with chronic hypoxemia on 2 to 4 L, history of lung cancer s/p wedge resection, chronic diastolic heart failure, history of DVT on Eliquis who presents with concerns of hypoxia.  Patient reportedly presented from SNF with hypoxia down to 85% on 5 L.  Reportedly she was tachypneic and wheezing requiring nonrebreather in the ER.  Patient is a limited historian although she is alert and oriented to self and knows the current president.  States she chronically has shortness of breath but unsure if it is worse.  Denies any cough.  Denies any chest pain or tightness.  Denies any nausea, vomiting or abdominal pain.  Patient was last admitted for COPD exacerbation on 02/2021 and also had a left pleural effusion s/p thoracentesis with acute on chronic CHF exacerbation.  Review of Systems: As mentioned in the history of present illness. All other systems reviewed and are negative. History reviewed. No pertinent past medical history. History reviewed. No pertinent surgical history. Social History:  reports that she quit smoking about 57 years ago. Her smoking use included cigarettes. She has a 25.00 pack-year smoking history. She has never been exposed to tobacco smoke. She has never used smokeless tobacco. She reports that she does not drink alcohol and does not use drugs.  Allergies  Allergen Reactions   Amoxicillin Nausea Only and Other (See Comments)    Listed on MAR   Doxycycline Nausea Only and Other (See Comments)    Listed on MAR   Levofloxacin Nausea Only and Other (See Comments)    Not documented on the [facility]    Morphine And Related Nausea Only and Other (See Comments)    Listed on MAR    Family History  Problem Relation Age of Onset   Hypertension Other     Prior to Admission medications   Medication Sig Start Date End Date Taking? Authorizing Provider  albuterol (VENTOLIN HFA) 108 (90 Base) MCG/ACT inhaler Inhale 2 puffs into the lungs See admin instructions. Inhale 2 puffs by mouth twice daily. May inhale 2 puffs by mouth every 4 hours as needed for wheezing.    [provider]  apixaban (ELIQUIS) 5 MG TABS tablet Take 1 tablet (5 mg total) by mouth 2 (two) times daily. 12/14/20   Caren Griffins, MD  digoxin (LANOXIN) 0.125 MG tablet Take 0.125 mg by mouth daily. Hold and notify MD if HR less than 60 04/25/20   [provider]  Ensure (ENSURE) Take 237 mLs by mouth 3 (three) times daily between meals.    [provider]  flecainide (TAMBOCOR) 50 MG tablet Take 75 mg by mouth 2 (two) times daily. for Sick Sinus Syndrome 07/27/20   [provider]  furosemide (LASIX) 20 MG tablet Take 40 mg by mouth daily. 08/05/20   [provider]  levalbuterol Penne Lash) 0.63 MG/3ML nebulizer solution Take 3 mLs by nebulization every 6 (six) hours as needed for wheezing. 07/20/19   [provider]  LORazepam (ATIVAN) 0.5 MG tablet Take 1 tablet (0.5 mg total) by mouth 3 (three) times daily as needed for anxiety. 03/02/21   Dessa Phi, DO  Multiple Vitamins-Minerals (MULTIVITAMIN WITH MINERALS) tablet Take 1 tablet by mouth daily.    [provider]  OXYGEN Inhale 2 L into the lungs continuous.    [provider]  predniSONE (DELTASONE) 10 MG tablet Take 4 tabs for 3 days, then 3 tabs for 3 days, then 2 tabs for 3 days, then 1 tab for 3 days, then 1/2 tab for 4 days. 03/02/21   Dessa Phi, DO  sertraline (ZOLOFT) 25 MG tablet Take 25 mg by mouth daily. 02/03/21   [provider]  tamsulosin (FLOMAX) 0.4 MG CAPS capsule Take 1  capsule (0.4 mg total) by mouth daily after breakfast. 12/14/20   Gherghe, Vella Redhead, MD  TRELEGY ELLIPTA 100-62.5-25 MCG/INH AEPB Take 1 puff by mouth daily. 08/06/20   [provider]    Physical Exam: Vitals:   05/08/21 2230 05/08/21 2245 05/08/21 2300 05/08/21 2314  BP: (!) 147/58 (!) 150/65 (!) 156/62   Pulse: (!) 108 (!) 104 (!) 101   Resp: (!) 27 19 (!) 28   Temp:      TempSrc:      SpO2: 95% 99% 98%   Weight:    50.3 kg  Height:    5\' 2"  (1.575 m)   Constitutional: NAD, calm, comfortable, elderly thin female laying at approximately 30 degree incline in bed holding nonrebreather away from her face eyes: lids and conjunctivae normal ENMT: Mucous membranes are moist.  Neck: normal, supple Respiratory: Diminished breath sounds throughout but no wheezing, no crackles. Normal respiratory effort. No accessory muscle use.  Patient gradually downward trended to O2 of 80% when talking without her nonrebreather mask. Cardiovascular: Sinus tachycardia, no murmurs / rubs / gallops. No extremity edema.  Abdomen: no tenderness, no masses palpated. Bowel sounds positive.  Musculoskeletal: no clubbing / cyanosis. No joint deformity upper and lower extremities.  Skin: no rashes, lesions, ulcers.  Neurologic: CN 2-12 grossly intact.  Strength 5/5 in all 4.  Psychiatric: Normal judgment and insight. Alert and oriented x 3. Normal mood.  Data Reviewed:  CBC showing leukocytosis of 13 K which is increased from normal several months ago.  BMP largely unremarkable.  Troponin is reassuring at 15.  VBG without any hypercapnia with CO2 49 Chest x-ray showing left basilar small pleural effusion  EKG on my review with low voltage, sinus tachycardia and no specific ST or T wave changes. Assessment and Plan:  Acute on chronic hypoxemic respiratory failure secondary to COPD exacerbation -Baseline and reportedly last admission was 2 to 4 L but presented on 5 L from SNF.  Required  nonrebreather. -Maintain O2 88 to 94%.  Wean down to baseline as tolerated. -Q6hr PRN ipratropium/Xopenex -IV 60 mg Solu-Medrol daily -Start IV Rocephin and azithromycin given leukocytosis.  Check procalcitonin.  Chronic diastolic heart failure Compensated.  Patient is euvolemic on exam.  History of sick sinus syndrome - Continue meds once verified   History of DVT - Continue Eliquis    Advance Care Planning:   Code Status: DNR -noted on paperwork from SNF  Consults: NONE  Family Communication: No family at bedside  Severity of Illness: The appropriate patient status for this patient is INPATIENT. Inpatient status is judged to be reasonable and necessary in order to provide the required intensity of service to ensure the patient's safety. The patient's presenting symptoms, physical exam findings, and initial radiographic and laboratory data in the context of their chronic comorbidities is felt to place them at high risk for further clinical deterioration.  Furthermore, it is not anticipated that the patient will be medically stable for discharge from the hospital within 2 midnights of admission.   * I certify that at the point of admission it is my clinical judgment that the patient will require inpatient hospital care spanning beyond 2 midnights from the point of admission due to high intensity of service, high risk for further deterioration and high frequency of surveillance required.*  Author: Orene Desanctis, DO 05/08/2021 11:41 PM  For on call review [email or web address].

## 2021-05-08 NOTE — ED Triage Notes (Signed)
BIB EMS from SNF for respiratory distress normally on 5LNC at facility but sats were in the 80s, placed on non rebreather by EMS

## 2021-05-09 LAB — BRAIN NATRIURETIC PEPTIDE: B Natriuretic Peptide: 210.2 pg/mL — ABNORMAL HIGH (ref 0.0–100.0)

## 2021-05-09 LAB — CBC
HCT: 29.5 % — ABNORMAL LOW (ref 36.0–46.0)
Hemoglobin: 9.6 g/dL — ABNORMAL LOW (ref 12.0–15.0)
MCH: 29.7 pg (ref 26.0–34.0)
MCHC: 32.5 g/dL (ref 30.0–36.0)
MCV: 91.3 fL (ref 80.0–100.0)
Platelets: 365 10*3/uL (ref 150–400)
RBC: 3.23 MIL/uL — ABNORMAL LOW (ref 3.87–5.11)
RDW: 13.8 % (ref 11.5–15.5)
WBC: 13.8 10*3/uL — ABNORMAL HIGH (ref 4.0–10.5)
nRBC: 0 % (ref 0.0–0.2)

## 2021-05-09 LAB — MRSA NEXT GEN BY PCR, NASAL: MRSA by PCR Next Gen: NOT DETECTED

## 2021-05-09 LAB — TROPONIN I (HIGH SENSITIVITY): Troponin I (High Sensitivity): 15 ng/L (ref <18)

## 2021-05-09 LAB — PROCALCITONIN: Procalcitonin: <0.1 ng/mL

## 2021-05-09 MED ORDER — METHYLPREDNISOLONE SODIUM SUCC 40 MG IJ SOLR
40.0000 mg | Freq: Every day | INTRAMUSCULAR | Status: DC
  Administered 2021-05-10: 40 mg via INTRAVENOUS
  Filled 2021-05-09: qty 1

## 2021-05-09 MED ORDER — FUROSEMIDE 20 MG PO TABS
20.0000 mg | ORAL_TABLET | Freq: Every day | ORAL | Status: DC
  Administered 2021-05-09 – 2021-05-10 (×2): 20 mg via ORAL
  Filled 2021-05-09 (×2): qty 1

## 2021-05-09 MED ORDER — APIXABAN 2.5 MG PO TABS
2.5000 mg | ORAL_TABLET | Freq: Once | ORAL | Status: AC
  Administered 2021-05-09: 2.5 mg via ORAL
  Filled 2021-05-09: qty 1

## 2021-05-09 MED ORDER — MIDODRINE HCL 5 MG PO TABS
10.0000 mg | ORAL_TABLET | Freq: Two times a day (BID) | ORAL | Status: DC
  Administered 2021-05-09 – 2021-05-10 (×3): 10 mg via ORAL
  Filled 2021-05-09 (×3): qty 2

## 2021-05-09 MED ORDER — APIXABAN 2.5 MG PO TABS
2.5000 mg | ORAL_TABLET | Freq: Two times a day (BID) | ORAL | Status: DC
  Administered 2021-05-09: 2.5 mg via ORAL
  Filled 2021-05-09: qty 1

## 2021-05-09 MED ORDER — TAMSULOSIN HCL 0.4 MG PO CAPS
0.4000 mg | ORAL_CAPSULE | Freq: Every day | ORAL | Status: DC
Start: 2021-05-10 — End: 2021-05-10
  Administered 2021-05-10: 0.4 mg via ORAL
  Filled 2021-05-09: qty 1

## 2021-05-09 MED ORDER — APIXABAN 5 MG PO TABS
5.0000 mg | ORAL_TABLET | Freq: Two times a day (BID) | ORAL | Status: DC
Start: 2021-05-09 — End: 2021-05-10
  Administered 2021-05-09 – 2021-05-10 (×2): 5 mg via ORAL
  Filled 2021-05-09 (×2): qty 1

## 2021-05-09 MED ORDER — DIGOXIN 125 MCG PO TABS
0.1250 mg | ORAL_TABLET | Freq: Every day | ORAL | Status: DC
Start: 2021-05-09 — End: 2021-05-10
  Administered 2021-05-09 – 2021-05-10 (×2): 0.125 mg via ORAL
  Filled 2021-05-09 (×2): qty 1

## 2021-05-09 MED ORDER — FLECAINIDE ACETATE 50 MG PO TABS
75.0000 mg | ORAL_TABLET | Freq: Two times a day (BID) | ORAL | Status: DC
  Administered 2021-05-09 – 2021-05-10 (×3): 75 mg via ORAL
  Filled 2021-05-09 (×4): qty 2

## 2021-05-09 MED ORDER — SERTRALINE HCL 25 MG PO TABS
25.0000 mg | ORAL_TABLET | Freq: Every day | ORAL | Status: DC
  Administered 2021-05-09 – 2021-05-10 (×2): 25 mg via ORAL
  Filled 2021-05-09 (×2): qty 1

## 2021-05-09 NOTE — Evaluation (Signed)
Occupational Therapy Evaluation Patient Details Name: Samantha Fitzgerald MRN: [id number] DOB: 04/04/1924 Today's Date: 05/09/2021   History of Present Illness 88 y.o. female presents wtih concerns of hypoxia. PMH: COPD with chronic hypoxemia on 2 to 4 L, history of lung cancer s/p wedge resection, chronic diastolic heart failure, history of DVT   Clinical Impression   Patient has been at [facility] for rehab since ~December. DTR reports patient was ambulating with PT/OT about 100 steps before fatigues due to COPD. DTR reports plan is to go back to rehab to maximize patient strength and endurance then likely transition to long term care. Patient reports dizziness with sitting up however BP stable. DTR reports patient also becomes anxious with mobility. Patient needing min A to power up to standing at side step to head of bed with rolling walker. Recommend return to rehab at D/C, acute OT to follow.      Recommendations for follow up therapy are one component of a multi-disciplinary discharge planning process, led by the attending physician.  Recommendations may be updated based on patient status, additional functional criteria and insurance authorization.   Follow Up Recommendations  Skilled nursing-short term rehab (<3 hours/day)    Assistance Recommended at Discharge Frequent or constant Supervision/Assistance  Patient can return home with the following A little help with walking and/or transfers;A lot of help with bathing/dressing/bathroom;Assistance with cooking/housework;Direct supervision/assist for medications management;Direct supervision/assist for financial management;Assist for transportation;Help with stairs or ramp for entrance    Functional Status Assessment  Patient has had a recent decline in their functional status and/or demonstrates limited ability to make significant improvements in function in a reasonable and predictable amount of time  Equipment  Recommendations  None recommended by OT       Precautions / Restrictions Precautions Precautions: Fall Precaution Comments: monitor vitals Restrictions Weight Bearing Restrictions: No      Mobility Bed Mobility Overal bed mobility: Needs Assistance Bed Mobility: Supine to Sit, Sit to Supine     Supine to sit: Min assist, HOB elevated Sit to supine: Min assist   General bed mobility comments: Min A to upright trunk to sitting and min A to bring legs back onto bed        Balance Overall balance assessment: Needs assistance Sitting-balance support: Feet supported Sitting balance-Leahy Scale: Fair     Standing balance support: Reliant on assistive device for balance Standing balance-Leahy Scale: Poor                             ADL either performed or assessed with clinical judgement   ADL Overall ADL's : Needs assistance/impaired Eating/Feeding: Set up;Sitting;Bed level   Grooming: Set up;Bed level   Upper Body Bathing: Minimal assistance;Sitting;Bed level   Lower Body Bathing: Maximal assistance;Sitting/lateral leans;Sit to/from stand Lower Body Bathing Details (indicate cue type and reason): dizziness and limited activity tolerance Upper Body Dressing : Minimal assistance;Sitting   Lower Body Dressing: Total assistance;Sitting/lateral leans;Sit to/from stand   Toilet Transfer: Minimal assistance;Cueing for safety;Cueing for sequencing;Rolling walker (2 wheels);Ambulation Toilet Transfer Details (indicate cue type and reason): Patient able to side step to head of bed with min A to power up to standing. Limited activity tolerance Toileting- Clothing Manipulation and Hygiene: Maximal assistance;Sitting/lateral lean;Sit to/from stand       Functional mobility during ADLs: Minimal assistance;Rolling walker (2 wheels);Cueing for safety;Cueing for sequencing        Pertinent Vitals/Pain Pain Assessment Pain Assessment:  No/denies pain     Hand  Dominance Right   Extremity/Trunk Assessment Upper Extremity Assessment Upper Extremity Assessment: Generalized weakness   Lower Extremity Assessment Lower Extremity Assessment: Defer to PT evaluation       Communication Communication Communication: HOH   Cognition Arousal/Alertness: Awake/alert Behavior During Therapy: Anxious Overall Cognitive Status: Within Functional Limits for tasks assessed                                 General Comments: Becomes more anxious with mobility     General Comments  O2 remain in mid 90s on 3L, BP in sitting 163/90 pt reporting dizziness            Home Living Family/patient expects to be discharged to:: Skilled nursing facility                                        Prior Functioning/Environment Prior Level of Function : Needs assist             Mobility Comments: Patient's DTR reports she ambulates about 100 steps with therapy and wheelchair follow ADLs Comments: Assistance from staff        OT Problem List: Decreased strength;Decreased activity tolerance;Impaired balance (sitting and/or standing);Cardiopulmonary status limiting activity;Decreased safety awareness      OT Treatment/Interventions: Self-care/ADL training;Therapeutic exercise;Energy conservation;DME and/or AE instruction;Therapeutic activities;Patient/family education;Balance training    OT Goals(Current goals can be found in the care plan section) Acute Rehab OT Goals Patient Stated Goal: Back to rehab OT Goal Formulation: With patient/family Time For Goal Achievement: 05/23/21 Potential to Achieve Goals: Fair  OT Frequency: Min 2X/week    Co-evaluation   Reason for Co-Treatment: To address functional/ADL transfers PT goals addressed during session: Mobility/safety with mobility;Proper use of DME;Balance        AM-PAC OT "6 Clicks" Daily Activity     Outcome Measure Help from another person eating meals?: None Help  from another person taking care of personal grooming?: A Little Help from another person toileting, which includes using toliet, bedpan, or urinal?: A Lot Help from another person bathing (including washing, rinsing, drying)?: A Lot Help from another person to put on and taking off regular upper body clothing?: A Little Help from another person to put on and taking off regular lower body clothing?: Total 6 Click Score: 15   End of Session Equipment Utilized During Treatment: Rolling walker (2 wheels);Oxygen Nurse Communication: Mobility status  Activity Tolerance: Patient limited by fatigue Patient left: in bed;with call bell/phone within reach;with bed alarm set;with family/visitor present  OT Visit Diagnosis: Unsteadiness on feet (R26.81);Other abnormalities of gait and mobility (R26.89);Muscle weakness (generalized) (M62.81)                Time: 4401-0272 OT Time Calculation (min): 15 min Charges:  OT General Charges $OT Visit: 1 Visit OT Evaluation $OT Eval Low Complexity: [place] OT OT pager: [place] 05/09/2021, 2:56 PM

## 2021-05-09 NOTE — NC FL2 (Signed)
[place] LEVEL OF CARE SCREENING TOOL     IDENTIFICATION  Patient Name: Samantha Fitzgerald Birthdate: 1924/06/26 Sex: female Admission Date (Current Location): 05/08/2021  [facility] and [place] Number:  Herbalist and Address:  [facility],  [address] [place], [place]      Provider Number: [id number]  Attending Physician Name and Address:  Donne Hazel, MD  Relative Name and Phone Number:  [place] dtr [phone]    Current Level of Care: Hospital Recommended Level of Care: Nursing Facility Prior Approval Number:    Date Approved/Denied:   PASRR Number:    Discharge Plan: SNF    Current Diagnoses: Patient Active Problem List   Diagnosis Date Noted   Acute on chronic respiratory failure with hypoxia ([place]) 05/08/2021   COPD with acute exacerbation ([place]) 05/08/2021   History of DVT (deep vein thrombosis) 05/08/2021   Pressure injury of skin 02/27/2021   Acute on chronic respiratory failure with hypoxemia ([place]) 02/26/2021   Bacteremia    Palliative care by specialist    Goals of care, counseling/discussion    General weakness    Sepsis ([place]) 12/03/2020   COVID-19 virus infection 12/03/2020   Acute lower UTI 12/03/2020   Cellulitis 12/03/2020   Chronic diastolic CHF (congestive heart failure) ([place]) 12/03/2020   Hypoalbuminemia 12/03/2020   AKI (acute kidney injury) ([place]) 12/03/2020   Pleural effusion on left 09/12/2020   COPD (chronic obstructive pulmonary disease) ([place]) 09/12/2020   Sick sinus syndrome ([place]) 09/12/2020   Hypoxia 09/12/2020    Orientation RESPIRATION BLADDER Height & Weight     Self, Time, Situation  O2 Continent Weight: 50.3 kg Height:  5\' 2"  (157.5 cm)  BEHAVIORAL SYMPTOMS/MOOD NEUROLOGICAL BOWEL NUTRITION STATUS      Continent Diet (Heart Healthy)  AMBULATORY STATUS COMMUNICATION OF NEEDS Skin   Limited Assist Verbally Normal                       Personal Care Assistance Level of  Assistance  Bathing, Feeding, Dressing Bathing Assistance: Limited assistance Feeding assistance: Limited assistance Dressing Assistance: Limited assistance     Functional Limitations Info  Sight, Hearing, Speech Sight Info: Impaired (eyeglasses-reading) Hearing Info: Impaired Speech Info: Impaired (Heart healthy)    SPECIAL CARE FACTORS FREQUENCY  PT (By licensed PT), OT (By licensed OT)     PT Frequency:  (2x week) OT Frequency:  (2x week)            Contractures Contractures Info: Not present    Additional Factors Info  Code Status, Allergies Code Status Info:  (DNR) Allergies Info:  (Amoxicillin, Doxycycline, Levofloxacin, Morphine And Related)           Current Medications (05/09/2021):  This is the current hospital active medication list Current Facility-Administered Medications  Medication Dose Route Frequency Provider Last Rate Last Admin   apixaban (ELIQUIS) tablet 2.5 mg  2.5 mg Oral Once Donne Hazel, MD       apixaban [facility]) tablet 5 mg  5 mg Oral BID Donne Hazel, MD       azithromycin [facility]) 500 mg in sodium chloride 0.9 % 250 mL IVPB  500 mg Intravenous Q24H Tu, Ching T, DO 250 mL/hr at 05/09/21 0000 500 mg at 05/09/21 0000   cefTRIAXone (ROCEPHIN) 1 g in sodium chloride 0.9 % 100 mL IVPB  1 g Intravenous Q24H Tu, Ching T, DO   Stopped at 05/09/21 [id number]  digoxin (LANOXIN) tablet 0.125 mg  0.125 mg Oral Daily Donne Hazel, MD       flecainide [facility]) tablet 75 mg  75 mg Oral BID Donne Hazel, MD       furosemide (LASIX) tablet 20 mg  20 mg Oral Daily Donne Hazel, MD       ipratropium (ATROVENT) nebulizer solution 0.5 mg  0.5 mg Nebulization Q4H PRN Tu, Ching T, DO       levalbuterol (XOPENEX) nebulizer solution 0.63 mg  0.63 mg Nebulization Q4H PRN Tu, Ching T, DO       [START ON 05/10/2021] methylPREDNISolone sodium succinate (SOLU-MEDROL) 40 mg/mL injection 40 mg  40 mg Intravenous Daily Donne Hazel, MD       midodrine  (PROAMATINE) tablet 10 mg  10 mg Oral BID Donne Hazel, MD       sertraline (ZOLOFT) tablet 25 mg  25 mg Oral Daily Donne Hazel, MD       [START ON 05/10/2021] tamsulosin (FLOMAX) capsule 0.4 mg  0.4 mg Oral QPC breakfast Donne Hazel, MD         Discharge Medications: Please see discharge summary for a list of discharge medications.  Relevant Imaging Results:  Relevant Lab Results:   Additional Information SS#[id number]  Donnovan Stamour, [name], RN

## 2021-05-09 NOTE — Evaluation (Signed)
Physical Therapy Evaluation Patient Details Name: Samantha Fitzgerald MRN: [id number] DOB: 16-Dec-1924 Today's Date: 05/09/2021  History of Present Illness  88 y.o. female presents wtih concerns of hypoxia. PMH: COPD with chronic hypoxemia on 2 to 4 L, history of lung cancer s/p wedge resection, chronic diastolic heart failure, history of DVT  Clinical Impression  Pt admitted with above diagnosis. Pt from SNF since December, ambulating with therapy with RW and w/c follow for safety, daughter reports pt fatigues from SOB before weakness requiring rest breaks. Pt requires assist to come sitting EOB, reports dizziness upon sitting, BP stable but returns to reclined position. Motivated pt to stand EOB and take a few sidesteps up to [facility] but unable to encourage forward ambulation or transfer to recliner due to "terrible" feeling complaints. Daughter reports pt becomes anxious with mobility requiring encouragement often. Pt below baseline, recommend return to rehab at d/c. Pt currently with functional limitations due to the deficits listed below (see PT Problem List). Pt will benefit from skilled PT to increase their independence and safety with mobility to allow discharge to the venue listed below.          Recommendations for follow up therapy are one component of a multi-disciplinary discharge planning process, led by the attending physician.  Recommendations may be updated based on patient status, additional functional criteria and insurance authorization.  Follow Up Recommendations Skilled nursing-short term rehab (<3 hours/day)    Assistance Recommended at Discharge Frequent or constant Supervision/Assistance  Patient can return home with the following       Equipment Recommendations None recommended by PT  Recommendations for Other Services       Functional Status Assessment Patient has had a recent decline in their functional status and demonstrates the ability to make significant  improvements in function in a reasonable and predictable amount of time.     Precautions / Restrictions Precautions Precautions: Fall Precaution Comments: monitor vitals Restrictions Weight Bearing Restrictions: No      Mobility  Bed Mobility Overal bed mobility: Needs Assistance Bed Mobility: Supine to Sit, Sit to Supine  Supine to sit: Min assist, HOB elevated Sit to supine: Min assist   General bed mobility comments: Min A to upright trunk to sitting and min A to bring legs back onto bed    Transfers Overall transfer level: Needs assistance Equipment used: Rolling walker (2 wheels) Transfers: Sit to/from Stand Sit to Stand: Min assist, +2 safety/equipment  General transfer comment: min A +2 for safety, assist to power to stand, maintains BLE braced against bed, able to perform 4 sidesteps up to [facility] but declines transfer to recilner or ambulation due to feeling "terrible"    Ambulation/Gait  General Gait Details: pt declines  Stairs            Wheelchair Mobility    Modified Rankin (Stroke Patients Only)       Balance Overall balance assessment: Needs assistance Sitting-balance support: Feet supported Sitting balance-Leahy Scale: Fair  Standing balance support: Bilateral upper extremity supported, During functional activity, Reliant on assistive device for balance Standing balance-Leahy Scale: Poor       Pertinent Vitals/Pain Pain Assessment Pain Assessment: No/denies pain    Home Living Family/patient expects to be discharged to:: Skilled nursing facility    Prior Function Prior Level of Function : Needs assist  Mobility Comments: Patient's DTR reports she ambulates about 100 steps with RW and w/c follow for safety ADLs Comments: Assistance from staff     Hand  Dominance   Dominant Hand: Right    Extremity/Trunk Assessment   Upper Extremity Assessment Upper Extremity Assessment: Defer to OT evaluation    Lower Extremity Assessment Lower  Extremity Assessment: Generalized weakness    Cervical / Trunk Assessment Cervical / Trunk Assessment: Kyphotic  Communication   Communication: HOH  Cognition Arousal/Alertness: Awake/alert Behavior During Therapy: Anxious Overall Cognitive Status: Within Functional Limits for tasks assessed  General Comments: pt anxious with mobility, reports dizziness and feeling "terrible", therapy and family attempted to encourage pt to ease anxiousness with mobility but limited        General Comments General comments (skin integrity, edema, etc.): Pt on 3L O2 with SpO2 96%, BP in sitting 163/90 pt reporting dizziness    Exercises     Assessment/Plan    PT Assessment Patient needs continued PT services  PT Problem List Decreased strength;Decreased activity tolerance;Decreased balance;Decreased mobility;Decreased knowledge of use of DME;Cardiopulmonary status limiting activity       PT Treatment Interventions DME instruction;Gait training;Functional mobility training;Therapeutic activities;Therapeutic exercise;Balance training;Patient/family education    PT Goals (Current goals can be found in the Care Plan section)  Acute Rehab PT Goals Patient Stated Goal: daugther reports return to rehab, transition to LTC PT Goal Formulation: With patient/family Time For Goal Achievement: 05/23/21 Potential to Achieve Goals: Fair    Frequency Min 2X/week     Co-evaluation PT/OT/SLP Co-Evaluation/Treatment: Yes Reason for Co-Treatment: To address functional/ADL transfers PT goals addressed during session: Mobility/safety with mobility;Balance;Proper use of DME         AM-PAC PT "6 Clicks" Mobility  Outcome Measure Help needed turning from your back to your side while in a flat bed without using bedrails?: A Little Help needed moving from lying on your back to sitting on the side of a flat bed without using bedrails?: A Little Help needed moving to and from a bed to a chair (including a  wheelchair)?: A Lot Help needed standing up from a chair using your arms (e.g., wheelchair or bedside chair)?: A Little Help needed to walk in hospital room?: A Lot Help needed climbing 3-5 steps with a railing? : Total 6 Click Score: 14    End of Session Equipment Utilized During Treatment: Oxygen Activity Tolerance: Patient tolerated treatment well;Treatment limited secondary to medical complications (Comment) (BP/dizziness) Patient left: in bed;with call bell/phone within reach;with bed alarm set;with family/visitor present Nurse Communication: Mobility status PT Visit Diagnosis: Other abnormalities of gait and mobility (R26.89);Unsteadiness on feet (R26.81);Muscle weakness (generalized) (M62.81)    Time: 1287-8676 PT Time Calculation (min) (ACUTE ONLY): 15 min   Charges:   PT Evaluation $PT Eval Low Complexity: 1 Low           Tori Vergie Zahm PT, DPT 05/09/21, 3:15 PM

## 2021-05-09 NOTE — Progress Notes (Signed)
°  Progress Note   Patient: Samantha Fitzgerald QPR:[id number] DOB: 07/18/1924 DOA: 05/08/2021     1 DOS: the patient was seen and examined on 05/09/2021   Brief hospital course: 88 y.o. female with medical history significant of COPD with chronic hypoxemia on 2 to 4 L, history of lung cancer s/p wedge resection, chronic diastolic heart failure, history of DVT on Eliquis who presents with concerns of hypoxia.   Patient reportedly presented from SNF with hypoxia down to 85% on 5 L.  Reportedly she was tachypneic and wheezing requiring nonrebreather in the ER.  Patient is a limited historian although she is alert and oriented to self and knows the current president.  States she chronically has shortness of breath but unsure if it is worse.  Denies any cough.  Denies any chest pain or tightness.  Denies any nausea, vomiting or abdominal pain.   Patient was last admitted for COPD exacerbation on 02/2021 and also had a left pleural effusion s/p thoracentesis with acute on chronic CHF exacerbation.  Assessment and Plan: No notes have been filed under this hospital service. Service: Hospitalist  Acute on chronic hypoxemic respiratory failure secondary to COPD exacerbation -Baseline and reportedly last admission was 2 to 4 L but presented on 5 L from SNF.  Required nonrebreather. -Weaned o2 down to baseline 3L this AM -Q6hr PRN ipratropium/Xopenex -IV 60 mg Solu-Medrol daily -Was started on empiric IV Rocephin and azithromycin given leukocytosis.  Procal is neg at <0.1. Will complete course thorough today   Chronic diastolic heart failure Compensated.  Patient is euvolemic on exam.   History of sick sinus syndrome - Cont home flecainide, digoxin per home regimen   History of DVT - Continue Eliquis       Subjective: Reports feeling much better today  Physical Exam: Vitals:   05/09/21 0034 05/09/21 0533 05/09/21 0700 05/09/21 1256  BP: (!) 135/52 (!) 126/59 (!) 126/59   Pulse: (!) 101  89 89 86  Resp: 20 20    Temp: 98.3 F (36.8 C) 98.4 F (36.9 C) 98.4 F (36.9 C)   TempSrc: Oral  Oral   SpO2: 96% 98% 96%   Weight:      Height:       General exam: Awake, laying in bed, in nad Respiratory system: Normal respiratory effort, no wheezing Cardiovascular system: regular rate, s1, s2 Gastrointestinal system: Soft, nondistended, positive BS Central nervous system: CN2-12 grossly intact, strength intact Extremities: Perfused, no clubbing Skin: Normal skin turgor, no notable skin lesions seen Psychiatry: Mood normal // no visual hallucinations   Data Reviewed:  Labs reviewed, procal <0.1  Family Communication: Pt in room, pt's daughter at bedside  Disposition: Status is: Inpatient Remains inpatient appropriate because: Severity of illness          Planned Discharge Destination:  Long term care      Author: Marylu Lund, MD 05/09/2021 1:13 PM  For on call review [email or web address].

## 2021-05-09 NOTE — NC FL2 (Signed)
[place] LEVEL OF CARE SCREENING TOOL     IDENTIFICATION  Patient Name: Samantha Fitzgerald Birthdate: [birth year] Sex: female Admission Date (Current Location): 05/08/2021  [facility] and [place] Number:  Herbalist and Address:  [facility],  [place], [place]      Provider Number: [id number]  Attending Physician Name and Address:  Donne Hazel, MD  Relative Name and Phone Number:  [place] dtr [phone]    Current Level of Care: Hospital Recommended Level of Care: Nursing Facility Prior Approval Number:    Date Approved/Denied:   PASRR Number:    Discharge Plan: Other (Comment) (LTC)    Current Diagnoses: Patient Active Problem List   Diagnosis Date Noted   Acute on chronic respiratory failure with hypoxia ([place]) 05/08/2021   COPD with acute exacerbation ([place]) 05/08/2021   History of DVT (deep vein thrombosis) 05/08/2021   Pressure injury of skin 02/27/2021   Acute on chronic respiratory failure with hypoxemia ([place]) 02/26/2021   Bacteremia    Palliative care by specialist    Goals of care, counseling/discussion    General weakness    Sepsis ([place]) 12/03/2020   COVID-19 virus infection 12/03/2020   Acute lower UTI 12/03/2020   Cellulitis 12/03/2020   Chronic diastolic CHF (congestive heart failure) ([place]) 12/03/2020   Hypoalbuminemia 12/03/2020   AKI (acute kidney injury) ([place]) 12/03/2020   Pleural effusion on left 09/12/2020   COPD (chronic obstructive pulmonary disease) ([place]) 09/12/2020   Sick sinus syndrome ([place]) 09/12/2020   Hypoxia 09/12/2020    Orientation RESPIRATION BLADDER Height & Weight     Self, Time, Situation  O2 Continent Weight: 50.3 kg Height:  5\' 2"  (157.5 cm)  BEHAVIORAL SYMPTOMS/MOOD NEUROLOGICAL BOWEL NUTRITION STATUS      Continent Diet (Heart Healthy)  AMBULATORY STATUS COMMUNICATION OF NEEDS Skin   Limited Assist Verbally Normal                       Personal Care  Assistance Level of Assistance  Bathing, Feeding, Dressing Bathing Assistance: Limited assistance Feeding assistance: Limited assistance Dressing Assistance: Limited assistance     Functional Limitations Info  Sight, Hearing, Speech Sight Info: Impaired (eyeglasses-reading) Hearing Info: Impaired Speech Info: Impaired (Heart healthy)    SPECIAL CARE FACTORS FREQUENCY  PT (By licensed PT), OT (By licensed OT)     PT Frequency:  (2x week) OT Frequency:  (2x week)            Contractures Contractures Info: Not present    Additional Factors Info  Code Status, Allergies Code Status Info:  (DNR) Allergies Info:  (Amoxicillin, Doxycycline, Levofloxacin, Morphine And Related)           Current Medications (05/09/2021):  This is the current hospital active medication list Current Facility-Administered Medications  Medication Dose Route Frequency Provider Last Rate Last Admin   apixaban (ELIQUIS) tablet 2.5 mg  2.5 mg Oral Once Donne Hazel, MD       apixaban Arne [facility]) tablet 5 mg  5 mg Oral BID Donne Hazel, MD       azithromycin [facility]) 500 mg in sodium chloride 0.9 % 250 mL IVPB  500 mg Intravenous Q24H Tu, Ching T, DO 250 mL/hr at 05/09/21 0000 500 mg at 05/09/21 0000   cefTRIAXone (ROCEPHIN) 1 g in sodium chloride 0.9 % 100 mL IVPB  1 g Intravenous Q24H Tu, Ching T, DO   Stopped at  05/09/21 0017   digoxin (LANOXIN) tablet 0.125 mg  0.125 mg Oral Daily Donne Hazel, MD       flecainide [facility]) tablet 75 mg  75 mg Oral BID Donne Hazel, MD       furosemide (LASIX) tablet 20 mg  20 mg Oral Daily Donne Hazel, MD       ipratropium (ATROVENT) nebulizer solution 0.5 mg  0.5 mg Nebulization Q4H PRN Tu, Ching T, DO       levalbuterol (XOPENEX) nebulizer solution 0.63 mg  0.63 mg Nebulization Q4H PRN Tu, Ching T, DO       [START ON 05/10/2021] methylPREDNISolone sodium succinate (SOLU-MEDROL) 40 mg/mL injection 40 mg  40 mg Intravenous Daily Donne Hazel, MD        midodrine (PROAMATINE) tablet 10 mg  10 mg Oral BID Donne Hazel, MD       sertraline (ZOLOFT) tablet 25 mg  25 mg Oral Daily Donne Hazel, MD       [START ON 05/10/2021] tamsulosin (FLOMAX) capsule 0.4 mg  0.4 mg Oral QPC breakfast Donne Hazel, MD         Discharge Medications: Please see discharge summary for a list of discharge medications.  Relevant Imaging Results:  Relevant Lab Results:   Additional Information SS#[phone]  [name], [name], RN

## 2021-05-09 NOTE — TOC Initial Note (Signed)
Transition of Care [facility]) - Initial/Assessment Note    Patient Details  Name: Samantha Fitzgerald MRN: [id number] Date of Birth: [birth year]  [facility]) CM/SW Contact:    Dessa Phi, RN Phone Number: 05/09/2021, 12:30 PM  Clinical Narrative:    Damaris Schooner to dtr Sheilagh to return back to [place] aware of possible d/c in am.               Expected Discharge Plan: Long Term Nursing Home Barriers to Discharge: Continued Medical Work up   Patient Goals and CMS Choice Patient states their goals for this hospitalization and ongoing recovery are:: return back to [facility].gov Compare Post Acute Care list provided to:: Patient Represenative (must comment) ([place] dtr [phone]) Choice offered to / list presented to : Adult Children  Expected Discharge Plan and Services Expected Discharge Plan: [place]   Discharge Planning Services: CM Consult Post Acute Care Choice: Nursing Home Living arrangements for the past 2 months: [place]                                      Prior Living Arrangements/Services Living arrangements for the past 2 months: [place] Lives with:: Facility Resident Patient language and need for interpreter reviewed:: Yes Do you feel safe going back to the place where you live?: Yes      Need for Family Participation in Patient Care: Yes (Comment) Care giver support system in place?: Yes (comment)   Criminal Activity/Legal Involvement Pertinent to Current Situation/Hospitalization: No - Comment as needed  Activities of Daily Living Home Assistive Devices/Equipment: Walker (specify type) ADL Screening (condition at time of admission) Patient's cognitive ability adequate to safely complete daily activities?: No Is the patient deaf or have difficulty hearing?: No Does the patient have difficulty seeing, even when wearing glasses/contacts?: No Does the patient have  difficulty concentrating, remembering, or making decisions?: Yes Patient able to express need for assistance with ADLs?: Yes Does the patient have difficulty dressing or bathing?: Yes Independently performs ADLs?: No Communication: Needs assistance Is this a change from baseline?: Pre-admission baseline Dressing (OT): Needs assistance Is this a change from baseline?: Pre-admission baseline Grooming: Needs assistance Is this a change from baseline?: Pre-admission baseline Feeding: Needs assistance Is this a change from baseline?: Pre-admission baseline Bathing: Needs assistance Is this a change from baseline?: Pre-admission baseline Toileting: Needs assistance Is this a change from baseline?: Pre-admission baseline In/Out Bed: Needs assistance Is this a change from baseline?: Pre-admission baseline Walks in Home: Needs assistance Is this a change from baseline?: Pre-admission baseline Does the patient have difficulty walking or climbing stairs?: Yes Weakness of Legs: Both Weakness of Arms/Hands: None  Permission Sought/Granted Permission sought to share information with : Case Manager Permission granted to share information with : Yes, Verbal Permission Granted  Share Information with NAME: Case Manager           Emotional Assessment              Admission diagnosis:  COPD exacerbation ([place]) [J44.1] Acute on chronic respiratory failure with hypoxia ([place]) [J96.21] Patient Active Problem List   Diagnosis Date Noted   Acute on chronic respiratory failure with hypoxia ([place]) 05/08/2021   COPD with acute exacerbation ([place]) 05/08/2021   History of DVT (deep vein thrombosis) 05/08/2021   Pressure injury of skin 02/27/2021   Acute  on chronic respiratory failure with hypoxemia ([place]) 02/26/2021   Bacteremia    Palliative care by specialist    Goals of care, counseling/discussion    General weakness    Sepsis ([place]) 12/03/2020   COVID-19 virus infection 12/03/2020   Acute  lower UTI 12/03/2020   Cellulitis 12/03/2020   Chronic diastolic CHF (congestive heart failure) ([place]) 12/03/2020   Hypoalbuminemia 12/03/2020   AKI (acute kidney injury) ([place]) 12/03/2020   Pleural effusion on left 09/12/2020   COPD (chronic obstructive pulmonary disease) ([place]) 09/12/2020   Sick sinus syndrome ([place]) 09/12/2020   Hypoxia 09/12/2020   PCP:  System, Provider Not In Pharmacy:   [facility] [place] [place] [facility] [place] [address] [place] [address] Phone: [phone] Fax: [phone]     Social Determinants of Health (SDOH) Interventions    Readmission Risk Interventions Readmission Risk Prevention Plan 05/09/2021 02/27/2021  Transportation Screening Complete Complete  PCP or Specialist Appt within 3-5 Days Complete Not Complete  Not Complete comments - Patient will discharge back to [facility].  [place] or Home Care Consult Complete Not Complete  HRI or Home Care Consult comments - Patient will discharge to SNF.  Social Work Consult for [place] Planning/Counseling Complete Complete  Palliative Care Screening Complete Not Applicable  Medication Review Press photographer) Complete -

## 2021-05-10 MED ORDER — PREDNISONE 10 MG PO TABS: ORAL_TABLET | ORAL | 0 refills | Status: AC

## 2021-05-10 NOTE — Progress Notes (Signed)
Report given to [place] at [facility]. VS stable, 2L [place] at this time. Discussed new medications and follow up appt needed. AVS added to packet. PTAR called.

## 2021-05-10 NOTE — Discharge Summary (Signed)
Physician Discharge Summary   Patient: Samantha Fitzgerald MRN: [id number] DOB: [birth year]  Admit date:     05/08/2021  Discharge date: 05/10/21  Discharge Physician: Marylu Lund   PCP: System, Provider Not In   Recommendations at discharge:    Follow up with PCP in 1-2 weeks  Discharge Diagnoses: Principal Problem:   Acute on chronic respiratory failure with hypoxia (HCC) Active Problems:   Sick sinus syndrome (HCC)   Chronic diastolic CHF (congestive heart failure) (HCC)   COPD with acute exacerbation (HCC)   History of DVT (deep vein thrombosis)  Resolved Problems:   * No resolved hospital problems. *   Hospital Course: 88 y.o. female with medical history significant of COPD with chronic hypoxemia on 2 to 4 L, history of lung cancer s/p wedge resection, chronic diastolic heart failure, history of DVT on Eliquis who presents with concerns of hypoxia.   Patient reportedly presented from SNF with hypoxia down to 85% on 5 L.  Reportedly she was tachypneic and wheezing requiring nonrebreather in the ER.  Patient is a limited historian although she is alert and oriented to self and knows the current president.  States she chronically has shortness of breath but unsure if it is worse.  Denies any cough.  Denies any chest pain or tightness.  Denies any nausea, vomiting or abdominal pain.   Patient was last admitted for COPD exacerbation on 02/2021 and also had a left pleural effusion s/p thoracentesis with acute on chronic CHF exacerbation.   Assessment and Plan: No notes have been filed under this hospital service. Service: Hospitalist  Acute on chronic hypoxemic respiratory failure secondary to COPD exacerbation -Baseline and reportedly last admission was 2 to 4 L but presented on 5 L from SNF.  Required nonrebreather. -Weaned o2 down to baseline 3LNC -Q6hr PRN ipratropium/Xopenex this visit -IV 60 mg Solu-Medrol daily while in hospital. Pt to complete prednisone taper on  d/c -given brief course of IV Rocephin and azithromycin this visit   Chronic diastolic heart failure Compensated.  Patient is euvolemic on exam.   History of sick sinus syndrome - Cont home flecainide, digoxin per home regimen   History of DVT - Continue Eliquis          Procedures performed:   Disposition: Long term care facility Diet recommendation:  Regular diet  DISCHARGE MEDICATION: Allergies as of 05/10/2021       Reactions   Amoxicillin Nausea Only, Other (See Comments)   Listed on MAR   Doxycycline Nausea Only, Other (See Comments)   Listed on MAR   Levofloxacin Nausea Only, Other (See Comments)   Not documented on the [facility]   Morphine And Related Nausea Only, Other (See Comments)   Listed on MAR        Medication List     TAKE these medications    acetaminophen 325 MG tablet Commonly known as: TYLENOL Take 650 mg by mouth every 6 (six) hours as needed for fever, headache, moderate pain or mild pain.   albuterol 108 (90 Base) MCG/ACT inhaler Commonly known as: VENTOLIN HFA Inhale 2 puffs into the lungs in the morning and at bedtime.   albuterol 108 (90 Base) MCG/ACT inhaler Commonly known as: VENTOLIN HFA Inhale 2 puffs into the lungs every 4 (four) hours as needed for wheezing.   apixaban 5 MG Tabs tablet Commonly known as: ELIQUIS Take 1 tablet (5 mg total) by mouth 2 (two) times daily.   digoxin 0.125 MG tablet Commonly known  as: LANOXIN Take 0.125 mg by mouth daily. Hold and notify MD if HR less than 60   Ensure Take 237 mLs by mouth 3 (three) times daily between meals.   flecainide 50 MG tablet Commonly known as: TAMBOCOR Take 75 mg by mouth 2 (two) times daily. for Sick Sinus Syndrome   furosemide 20 MG tablet Commonly known as: LASIX Take 20 mg by mouth daily.   loperamide 2 MG tablet Commonly known as: IMODIUM A-D Take 2 mg by mouth as needed for diarrhea or loose stools. After each subsequent loose BM, give 2 tabs    loperamide 2 MG tablet Commonly known as: IMODIUM A-D Take 2-4 mg by mouth as needed (give 2 tabs after 1st loose BM; then 1 tab after each subsequent loose BM.).   midodrine 10 MG tablet Commonly known as: PROAMATINE Take 10 mg by mouth in the morning and at bedtime.   OXYGEN Inhale 2 L into the lungs continuous.   predniSONE 10 MG tablet Commonly known as: DELTASONE Take 4 tablets (40 mg total) by mouth daily for 2 days, THEN 2 tablets (20 mg total) daily for 2 days, THEN 1 tablet (10 mg total) daily for 2 days, THEN 0.5 tablets (5 mg total) daily for 2 days. Start taking on: May 10, 2021   sertraline 25 MG tablet Commonly known as: ZOLOFT Take 25 mg by mouth daily.   tamsulosin 0.4 MG Caps capsule Commonly known as: FLOMAX Take 1 capsule (0.4 mg total) by mouth daily after breakfast.   Trelegy Ellipta 100-62.5-25 MCG/ACT Aepb Generic drug: Fluticasone-Umeclidin-Vilant Take 1 puff by mouth daily.        Follow-up Information     Follow up with PCP in 1-2 weeks Follow up.   Why: Hospital follow up                Discharge Exam: Filed Weights   05/08/21 2314  Weight: 50.3 kg   General exam: Awake, laying in bed, in nad Respiratory system: Normal respiratory effort, no wheezing Cardiovascular system: regular rate, s1, s2 Gastrointestinal system: Soft, nondistended, positive BS Central nervous system: CN2-12 grossly intact, strength intact Extremities: Perfused, no clubbing Skin: Normal skin turgor, no notable skin lesions seen Psychiatry: Mood normal // no visual hallucinations   Condition at discharge: good  The results of significant diagnostics from this hospitalization (including imaging, microbiology, ancillary and laboratory) are listed below for reference.   Imaging Studies: DG Chest Port 1 View  Result Date: 05/08/2021 CLINICAL DATA:  Shortness of breath with low oxygen saturation. EXAM: PORTABLE CHEST 1 VIEW COMPARISON:  February 27, 2021 FINDINGS: Limited study secondary to patient rotation. Mild to moderate severity left basilar atelectasis and/or infiltrate is seen. A small, predominant stable left pleural effusion is noted. No pneumothorax is identified. The heart size and mediastinal contours are within normal limits. Radiopaque surgical clips are seen overlying the right hilum. There is marked severity calcification of the thoracic aorta. The visualized skeletal structures are unremarkable. IMPRESSION: 1. Mild to moderate severity left basilar atelectasis and/or infiltrate. 2. Small, predominant stable left pleural effusion. Electronically Signed   By: Virgina Norfolk M.D.   On: 05/08/2021 22:36    Microbiology: Results for orders placed or performed during the hospital encounter of 05/08/21  Resp Panel by RT-PCR (Flu A&B, Covid) Nasopharyngeal Swab     Status: None   Collection Time: 05/08/21 10:34 PM   Specimen: Nasopharyngeal Swab; Nasopharyngeal(NP) swabs in vial transport medium  Result Value  Ref Range Status   SARS Coronavirus 2 by RT PCR NEGATIVE NEGATIVE Final    Comment: (NOTE) SARS-CoV-2 target nucleic acids are NOT DETECTED.  The SARS-CoV-2 RNA is generally detectable in upper respiratory specimens during the acute phase of infection. The lowest concentration of SARS-CoV-2 viral copies this assay can detect is 138 copies/mL. A negative result does not preclude SARS-Cov-2 infection and should not be used as the sole basis for treatment or other patient management decisions. A negative result may occur with  improper specimen collection/handling, submission of specimen other than nasopharyngeal swab, presence of viral mutation(s) within the areas targeted by this assay, and inadequate number of viral copies(<138 copies/mL). A negative result must be combined with clinical observations, patient history, and epidemiological information. The expected result is Negative.  Fact Sheet for Patients:   EntrepreneurPulse.com.au  Fact Sheet for Healthcare Providers:  IncredibleEmployment.be  This test is no t yet approved or cleared by the [place] FDA and  has been authorized for detection and/or diagnosis of SARS-CoV-2 by FDA under an Emergency Use Authorization (EUA). This EUA will remain  in effect (meaning this test can be used) for the duration of the COVID-19 declaration under Section 564(b)(1) of the Act, 21 U.S.C.section 360bbb-3(b)(1), unless the authorization is terminated  or revoked sooner.       Influenza A by PCR NEGATIVE NEGATIVE Final   Influenza B by PCR NEGATIVE NEGATIVE Final    Comment: (NOTE) The Xpert Xpress SARS-CoV-2/FLU/RSV plus assay is intended as an aid in the diagnosis of influenza from Nasopharyngeal swab specimens and should not be used as a sole basis for treatment. Nasal washings and aspirates are unacceptable for Xpert Xpress SARS-CoV-2/FLU/RSV testing.  Fact Sheet for Patients: EntrepreneurPulse.com.au  Fact Sheet for Healthcare Providers: IncredibleEmployment.be  This test is not yet approved or cleared by the [place] FDA and has been authorized for detection and/or diagnosis of SARS-CoV-2 by FDA under an Emergency Use Authorization (EUA). This EUA will remain in effect (meaning this test can be used) for the duration of the COVID-19 declaration under Section 564(b)(1) of the Act, 21 U.S.C. section 360bbb-3(b)(1), unless the authorization is terminated or revoked.  Performed at [facility], [place] [address], [place], [place] [address]   MRSA Next Gen by PCR, Nasal     Status: None   Collection Time: 05/09/21 11:19 AM   Specimen: Nasal Mucosa; Nasal Swab  Result Value Ref Range Status   MRSA by PCR Next Gen NOT DETECTED NOT DETECTED Final    Comment: (NOTE) The GeneXpert MRSA Assay (FDA approved for NASAL specimens only), is one  component of a comprehensive MRSA colonization surveillance program. It is not intended to diagnose MRSA infection nor to guide or monitor treatment for MRSA infections. Test performance is not FDA approved in patients less than 47 years old. Performed at [facility], [place] [address] [place], [place] [address]     Labs: CBC: Recent Labs  Lab 05/08/21 2220 05/08/21 2257 05/09/21 0053  WBC 13.0*  --  13.8*  NEUTROABS 10.6*  --   --   HGB 9.4* 9.5* 9.6*  HCT 28.7* 28.0* 29.5*  MCV 91.1  --  91.3  PLT 380  --  768   Basic Metabolic Panel: Recent Labs  Lab 05/08/21 2220 05/08/21 2257  NA 131* 132*  K 4.4 4.4  CL 94* 93*  CO2 30  --   GLUCOSE 143* 139*  BUN 27* 24*  CREATININE 0.76  0.70  CALCIUM 8.3*  --    Liver Function Tests: Recent Labs  Lab 05/08/21 2220  AST 14*  ALT 12  ALKPHOS 73  BILITOT 0.3  PROT 6.1*  ALBUMIN 2.7*   CBG: No results for input(s): GLUCAP in the last 168 hours.  Discharge time spent: less than 30 minutes.  Signed: Marylu Lund, MD [facility] 05/10/2021

## 2021-05-10 NOTE — Plan of Care (Signed)
POC adequate for discharge

## 2021-05-10 NOTE — TOC Transition Note (Addendum)
Transition of Care [facility]) - CM/SW Discharge Note   Patient Details  Name: Dacoda Spallone MRN: [id number] Date of Birth: 1924/06/23  Transition of Care [facility]) CM/SW Contact:  Dessa Phi, RN Phone Number: 05/10/2021, 12:23 PM   Clinical Narrative: d/c back to [facility] accepted-going to rm#[id number],nsg call report tel#[phone]. PTAR to be called once d/c summary sent.No further CM needs.   -1:35p-PTAR called. No further CM needs.    Final next level of care: Long Term Nursing Home Barriers to Discharge: No Barriers Identified   Patient Goals and CMS Choice Patient states their goals for this hospitalization and ongoing recovery are:: return back to [facility] CMS Medicare.gov Compare Post Acute Care list provided to:: Patient Represenative (must comment) ([place] dtr [phone]) Choice offered to / list presented to : Adult Children  Discharge Placement   Existing PASRR number confirmed : 05/07/21          Patient chooses bed at: [facility] Patient to be transferred to facility by: [place] Name of family member notified: [place]il[place] dtr [phone] vm Patient and family notified of of transfer: 05/10/21  Discharge Plan and Services   Discharge Planning Services: CM Consult Post Acute Care Choice: Nursing Home                               Social Determinants of Health (SDOH) Interventions     Readmission Risk Interventions Readmission Risk Prevention Plan 05/09/2021 02/27/2021  Transportation Screening Complete Complete  PCP or Specialist Appt within 3-5 Days Complete Not Complete  Not Complete comments - Patient will discharge back to [facility].  [place] or Home Care Consult Complete Not Complete  HRI or Home Care Consult comments - Patient will discharge to SNF.  Social Work Consult for [place] Planning/Counseling Complete Complete  Palliative Care Screening Complete Not Applicable  Medication Review Press photographer)  Complete -

## 2021-06-01 ENCOUNTER — Inpatient Hospital Stay (HOSPITAL_COMMUNITY)
Admission: EM | Admit: 2021-06-01 | Discharge: 2021-06-11 | DRG: 291 | Disposition: A | Discharge status: Skilled Nursing Facility | Payer: Medicare Other | Source: Skilled Nursing Facility | Attending: Internal Medicine | Admitting: Internal Medicine

## 2021-06-01 ENCOUNTER — Other Ambulatory Visit: Payer: Self-pay

## 2021-06-01 ENCOUNTER — Inpatient Hospital Stay (HOSPITAL_COMMUNITY): Payer: Medicare Other

## 2021-06-01 ENCOUNTER — Encounter (HOSPITAL_COMMUNITY): Payer: Self-pay

## 2021-06-01 ENCOUNTER — Emergency Department (HOSPITAL_COMMUNITY): Payer: Medicare Other

## 2021-06-01 DIAGNOSIS — J449 Chronic obstructive pulmonary disease, unspecified: Secondary | ICD-10-CM | POA: Diagnosis present

## 2021-06-01 DIAGNOSIS — Z885 Allergy status to narcotic agent status: Secondary | ICD-10-CM | POA: Diagnosis not present

## 2021-06-01 DIAGNOSIS — I272 Pulmonary hypertension, unspecified: Secondary | ICD-10-CM | POA: Diagnosis present

## 2021-06-01 DIAGNOSIS — Z9981 Dependence on supplemental oxygen: Secondary | ICD-10-CM

## 2021-06-01 DIAGNOSIS — J431 Panlobular emphysema: Secondary | ICD-10-CM | POA: Diagnosis not present

## 2021-06-01 DIAGNOSIS — Z79899 Other long term (current) drug therapy: Secondary | ICD-10-CM

## 2021-06-01 DIAGNOSIS — Z66 Do not resuscitate: Secondary | ICD-10-CM | POA: Diagnosis present

## 2021-06-01 DIAGNOSIS — J189 Pneumonia, unspecified organism: Principal | ICD-10-CM

## 2021-06-01 DIAGNOSIS — Z681 Body mass index (BMI) 19 or less, adult: Secondary | ICD-10-CM

## 2021-06-01 DIAGNOSIS — J9601 Acute respiratory failure with hypoxia: Secondary | ICD-10-CM | POA: Diagnosis present

## 2021-06-01 DIAGNOSIS — Z515 Encounter for palliative care: Secondary | ICD-10-CM | POA: Diagnosis not present

## 2021-06-01 DIAGNOSIS — I5032 Chronic diastolic (congestive) heart failure: Secondary | ICD-10-CM | POA: Diagnosis present

## 2021-06-01 DIAGNOSIS — D649 Anemia, unspecified: Secondary | ICD-10-CM | POA: Diagnosis present

## 2021-06-01 DIAGNOSIS — Z85118 Personal history of other malignant neoplasm of bronchus and lung: Secondary | ICD-10-CM

## 2021-06-01 DIAGNOSIS — E43 Unspecified severe protein-calorie malnutrition: Secondary | ICD-10-CM | POA: Diagnosis present

## 2021-06-01 DIAGNOSIS — I951 Orthostatic hypotension: Secondary | ICD-10-CM | POA: Diagnosis not present

## 2021-06-01 DIAGNOSIS — L89151 Pressure ulcer of sacral region, stage 1: Secondary | ICD-10-CM | POA: Diagnosis present

## 2021-06-01 DIAGNOSIS — Z86718 Personal history of other venous thrombosis and embolism: Secondary | ICD-10-CM

## 2021-06-01 DIAGNOSIS — Z881 Allergy status to other antibiotic agents status: Secondary | ICD-10-CM

## 2021-06-01 DIAGNOSIS — L89152 Pressure ulcer of sacral region, stage 2: Secondary | ICD-10-CM | POA: Diagnosis present

## 2021-06-01 DIAGNOSIS — K219 Gastro-esophageal reflux disease without esophagitis: Secondary | ICD-10-CM | POA: Diagnosis present

## 2021-06-01 DIAGNOSIS — I480 Paroxysmal atrial fibrillation: Secondary | ICD-10-CM | POA: Diagnosis present

## 2021-06-01 DIAGNOSIS — I5033 Acute on chronic diastolic (congestive) heart failure: Secondary | ICD-10-CM | POA: Diagnosis present

## 2021-06-01 DIAGNOSIS — Z7951 Long term (current) use of inhaled steroids: Secondary | ICD-10-CM | POA: Diagnosis not present

## 2021-06-01 DIAGNOSIS — I110 Hypertensive heart disease with heart failure: Principal | ICD-10-CM | POA: Diagnosis present

## 2021-06-01 DIAGNOSIS — J441 Chronic obstructive pulmonary disease with (acute) exacerbation: Secondary | ICD-10-CM | POA: Diagnosis present

## 2021-06-01 DIAGNOSIS — R64 Cachexia: Secondary | ICD-10-CM | POA: Diagnosis present

## 2021-06-01 DIAGNOSIS — Z20822 Contact with and (suspected) exposure to covid-19: Secondary | ICD-10-CM | POA: Diagnosis present

## 2021-06-01 DIAGNOSIS — Z7901 Long term (current) use of anticoagulants: Secondary | ICD-10-CM

## 2021-06-01 DIAGNOSIS — J90 Pleural effusion, not elsewhere classified: Secondary | ICD-10-CM | POA: Diagnosis not present

## 2021-06-01 DIAGNOSIS — Z880 Allergy status to penicillin: Secondary | ICD-10-CM

## 2021-06-01 DIAGNOSIS — E785 Hyperlipidemia, unspecified: Secondary | ICD-10-CM | POA: Diagnosis present

## 2021-06-01 DIAGNOSIS — Z87891 Personal history of nicotine dependence: Secondary | ICD-10-CM

## 2021-06-01 DIAGNOSIS — Z7189 Other specified counseling: Secondary | ICD-10-CM | POA: Diagnosis not present

## 2021-06-01 DIAGNOSIS — I4892 Unspecified atrial flutter: Secondary | ICD-10-CM | POA: Diagnosis not present

## 2021-06-01 DIAGNOSIS — I471 Supraventricular tachycardia, unspecified: Secondary | ICD-10-CM | POA: Diagnosis present

## 2021-06-01 DIAGNOSIS — I495 Sick sinus syndrome: Secondary | ICD-10-CM | POA: Diagnosis present

## 2021-06-01 DIAGNOSIS — E876 Hypokalemia: Secondary | ICD-10-CM | POA: Diagnosis not present

## 2021-06-01 DIAGNOSIS — E871 Hypo-osmolality and hyponatremia: Secondary | ICD-10-CM | POA: Diagnosis present

## 2021-06-01 DIAGNOSIS — J9621 Acute and chronic respiratory failure with hypoxia: Secondary | ICD-10-CM | POA: Diagnosis present

## 2021-06-01 DIAGNOSIS — L89101 Pressure ulcer of unspecified part of back, stage 1: Secondary | ICD-10-CM | POA: Diagnosis present

## 2021-06-01 DIAGNOSIS — M810 Age-related osteoporosis without current pathological fracture: Secondary | ICD-10-CM | POA: Diagnosis present

## 2021-06-01 DIAGNOSIS — R0602 Shortness of breath: Secondary | ICD-10-CM | POA: Diagnosis present

## 2021-06-01 DIAGNOSIS — R54 Age-related physical debility: Secondary | ICD-10-CM | POA: Diagnosis present

## 2021-06-01 DIAGNOSIS — I5021 Acute systolic (congestive) heart failure: Secondary | ICD-10-CM | POA: Diagnosis not present

## 2021-06-01 DIAGNOSIS — I5031 Acute diastolic (congestive) heart failure: Secondary | ICD-10-CM | POA: Diagnosis not present

## 2021-06-01 HISTORY — DX: Respiratory failure, unspecified, unspecified whether with hypoxia or hypercapnia: J96.90

## 2021-06-01 HISTORY — DX: Anemia, unspecified: D64.9

## 2021-06-01 HISTORY — DX: Gastro-esophageal reflux disease without esophagitis: K21.9

## 2021-06-01 HISTORY — DX: Hyperlipidemia, unspecified: E78.5

## 2021-06-01 HISTORY — DX: Malignant neoplasm of unspecified part of unspecified bronchus or lung: C34.90

## 2021-06-01 HISTORY — DX: Chronic embolism and thrombosis of right femoral vein: I82.511

## 2021-06-01 HISTORY — DX: Retention of urine, unspecified: R33.9

## 2021-06-01 HISTORY — DX: Pulmonary hypertension, unspecified: I27.20

## 2021-06-01 HISTORY — DX: Pneumonia, unspecified organism: J18.9

## 2021-06-01 HISTORY — DX: Ventricular premature depolarization: I49.3

## 2021-06-01 HISTORY — DX: Pleural effusion, not elsewhere classified: J90

## 2021-06-01 HISTORY — DX: Unspecified protein-calorie malnutrition: E46

## 2021-06-01 HISTORY — DX: Dysphagia, unspecified: R13.10

## 2021-06-01 HISTORY — DX: Hypomagnesemia: E83.42

## 2021-06-01 HISTORY — DX: Heart failure, unspecified: I50.9

## 2021-06-01 HISTORY — DX: Sick sinus syndrome: I49.5

## 2021-06-01 HISTORY — DX: Age-related osteoporosis without current pathological fracture: M81.0

## 2021-06-01 HISTORY — DX: Chronic obstructive pulmonary disease, unspecified: J44.9

## 2021-06-01 HISTORY — DX: Hypokalemia: E87.6

## 2021-06-01 LAB — COMPREHENSIVE METABOLIC PANEL
ALT: 8 U/L (ref 0–44)
AST: 21 U/L (ref 15–41)
Albumin: 2.5 g/dL — ABNORMAL LOW (ref 3.5–5.0)
Alkaline Phosphatase: 82 U/L (ref 38–126)
Anion gap: 8 (ref 5–15)
BUN: 11 mg/dL (ref 8–23)
CO2: 29 mmol/L (ref 22–32)
Calcium: 7.8 mg/dL — ABNORMAL LOW (ref 8.9–10.3)
Chloride: 92 mmol/L — ABNORMAL LOW (ref 98–111)
Creatinine, Ser: 0.69 mg/dL (ref 0.44–1.00)
GFR, Estimated: >60 mL/min (ref >60)
Glucose, Bld: 117 mg/dL — ABNORMAL HIGH (ref 70–99)
Potassium: 4.9 mmol/L (ref 3.5–5.1)
Sodium: 129 mmol/L — ABNORMAL LOW (ref 135–145)
Total Bilirubin: 0.9 mg/dL (ref 0.3–1.2)
Total Protein: 5.5 g/dL — ABNORMAL LOW (ref 6.5–8.1)

## 2021-06-01 LAB — RESP PANEL BY RT-PCR (FLU A&B, COVID) ARPGX2
Influenza A by PCR: NEGATIVE
Influenza B by PCR: NEGATIVE
SARS Coronavirus 2 by RT PCR: NEGATIVE

## 2021-06-01 LAB — CBC WITH DIFFERENTIAL/PLATELET
Abs Immature Granulocytes: 0.05 10*3/uL (ref 0.00–0.07)
Basophils Absolute: 0 10*3/uL (ref 0.0–0.1)
Basophils Relative: 0 %
Eosinophils Absolute: 0.3 10*3/uL (ref 0.0–0.5)
Eosinophils Relative: 3 %
HCT: 30 % — ABNORMAL LOW (ref 36.0–46.0)
Hemoglobin: 9.6 g/dL — ABNORMAL LOW (ref 12.0–15.0)
Immature Granulocytes: 1 %
Lymphocytes Relative: 13 %
Lymphs Abs: 1.2 10*3/uL (ref 0.7–4.0)
MCH: 29.5 pg (ref 26.0–34.0)
MCHC: 32 g/dL (ref 30.0–36.0)
MCV: 92.3 fL (ref 80.0–100.0)
Monocytes Absolute: 0.6 10*3/uL (ref 0.1–1.0)
Monocytes Relative: 7 %
Neutro Abs: 6.8 10*3/uL (ref 1.7–7.7)
Neutrophils Relative %: 76 %
Platelets: 391 10*3/uL (ref 150–400)
RBC: 3.25 MIL/uL — ABNORMAL LOW (ref 3.87–5.11)
RDW: 14.2 % (ref 11.5–15.5)
WBC: 8.9 10*3/uL (ref 4.0–10.5)
nRBC: 0 % (ref 0.0–0.2)

## 2021-06-01 LAB — LACTIC ACID, PLASMA: Lactic Acid, Venous: 1.9 mmol/L (ref 0.5–1.9)

## 2021-06-01 MED ORDER — IOHEXOL 350 MG/ML SOLN
75.0000 mL | Freq: Once | INTRAVENOUS | Status: AC | PRN
  Administered 2021-06-01: 75 mL via INTRAVENOUS

## 2021-06-01 MED ORDER — LEVOFLOXACIN IN D5W 750 MG/150ML IV SOLN
750.0000 mg | Freq: Once | INTRAVENOUS | Status: AC
  Administered 2021-06-01: 750 mg via INTRAVENOUS
  Filled 2021-06-01: qty 150

## 2021-06-01 NOTE — ED Provider Notes (Signed)
?[facility] DEPT ?Provider Note ? ? ?CSN: [id number] ?Arrival date & time: 06/01/21  2051 ? ?  ? ?History ? ?Chief Complaint  ?Patient presents with  ? Shortness of Breath  ? ? ?Samantha Fitzgerald is a 88 y.o. female. ? ?88 year old female presents with shortness of breath x1 day.  Does have a history of CHF and stays at a facility.  EMS was called due to her worsening breathing.  When arrived there, blood pressure was 70/47.  Heart rate was in 150s.  EMS gave patient 300 cc of bolus and blood pressure improved 96/60.  Patient states that she had a mild cough but no fever.  Denies any emesis or diarrhea.  No chest or abdominal discomfort. ? ? ?  ? ?Home Medications ?Prior to Admission medications   ?Medication Sig Start Date End Date Taking? Authorizing Provider  ?acetaminophen (TYLENOL) 325 MG tablet Take 650 mg by mouth every 6 (six) hours as needed for fever, headache, moderate pain or mild pain.    [provider]  ?albuterol (VENTOLIN HFA) 108 (90 Base) MCG/ACT inhaler Inhale 2 puffs into the lungs in the morning and at bedtime.    [provider]  ?albuterol (VENTOLIN HFA) 108 (90 Base) MCG/ACT inhaler Inhale 2 puffs into the lungs every 4 (four) hours as needed for wheezing.    [provider]  ?apixaban (ELIQUIS) 5 MG TABS tablet Take 1 tablet (5 mg total) by mouth 2 (two) times daily. 12/14/20   Caren Griffins, MD  ?digoxin (LANOXIN) 0.125 MG tablet Take 0.125 mg by mouth daily. Hold and notify MD if HR less than 60 04/25/20   [provider]  ?Ensure (ENSURE) Take 237 mLs by mouth 3 (three) times daily between meals.    [provider]  ?flecainide (TAMBOCOR) 50 MG tablet Take 75 mg by mouth 2 (two) times daily. for Sick Sinus Syndrome 07/27/20   [provider]  ?furosemide (LASIX) 20 MG tablet Take 20 mg by mouth daily. 08/05/20   [provider]  ?loperamide (IMODIUM A-D) 2 MG tablet Take 2 mg by mouth as  needed for diarrhea or loose stools. After each subsequent loose BM, give 2 tabs    [provider]  ?loperamide (IMODIUM A-D) 2 MG tablet Take 2-4 mg by mouth as needed (give 2 tabs after 1st loose BM; then 1 tab after each subsequent loose BM.).    [provider]  ?midodrine (PROAMATINE) 10 MG tablet Take 10 mg by mouth in the morning and at bedtime.    [provider]  ?OXYGEN Inhale 2 L into the lungs continuous.    [provider]  ?sertraline (ZOLOFT) 25 MG tablet Take 25 mg by mouth daily. 02/03/21   [provider]  ?tamsulosin (FLOMAX) 0.4 MG CAPS capsule Take 1 capsule (0.4 mg total) by mouth daily after breakfast. 12/14/20   Caren Griffins, MD  ?Donnal Debar 100-62.5-25 MCG/INH AEPB Take 1 puff by mouth daily. 08/06/20   [provider]  ?   ? ?Allergies    ?Amoxicillin, Doxycycline, Levofloxacin, and Morphine and related   ? ?Review of Systems   ?Review of Systems  ?All other systems reviewed and are negative. ? ?Physical Exam ?Updated Vital Signs ?SpO2 100% Comment: 3LNC ?Physical Exam ?Vitals and nursing note reviewed.  ?Constitutional:   ?   General: She is not in acute distress. ?   Appearance: Normal appearance. She is well-developed. She is not toxic-appearing.  ?  HENT:  ?   Head: Normocephalic and atraumatic.  ?Eyes:  ?   General: Lids are normal.  ?   Conjunctiva/sclera: Conjunctivae normal.  ?   Pupils: Pupils are equal, round, and reactive to light.  ?Neck:  ?   Thyroid: No thyroid mass.  ?   Trachea: No tracheal deviation.  ?Cardiovascular:  ?   Rate and Rhythm: Regular rhythm. Tachycardia present.  ?   Heart sounds: Normal heart sounds. No murmur heard. ?  No gallop.  ?Pulmonary:  ?   Effort: Pulmonary effort is normal. No respiratory distress.  ?   Breath sounds: No stridor. Examination of the right-upper field reveals decreased breath sounds. Examination of the left-upper field reveals decreased breath sounds. Decreased breath sounds  present. No wheezing, rhonchi or rales.  ?Abdominal:  ?   General: There is no distension.  ?   Palpations: Abdomen is soft.  ?   Tenderness: There is no abdominal tenderness. There is no rebound.  ?Musculoskeletal:     ?   General: No tenderness. Normal range of motion.  ?   Cervical back: Normal range of motion and neck supple.  ?Skin: ?   General: Skin is warm and dry.  ?   Findings: No abrasion or rash.  ?Neurological:  ?   Mental Status: She is alert and oriented to person, place, and time. Mental status is at baseline.  ?   GCS: GCS eye subscore is 4. GCS verbal subscore is 5. GCS motor subscore is 6.  ?   Cranial Nerves: No cranial nerve deficit.  ?   Sensory: No sensory deficit.  ?   Motor: Motor function is intact.  ?Psychiatric:     ?   Attention and Perception: Attention normal.     ?   Mood and Affect: Affect is blunt.     ?   Speech: Speech normal.     ?   Behavior: Behavior normal.  ? ? ?ED Results / Procedures / Treatments   ?Labs ?(all labs ordered are listed, but only abnormal results are displayed) ?Labs Reviewed  ?CULTURE, BLOOD (ROUTINE X 2)  ?CULTURE, BLOOD (ROUTINE X 2)  ?RESP PANEL BY RT-PCR (FLU A&B, COVID) ARPGX2  ?LACTIC ACID, PLASMA  ?CBC WITH DIFFERENTIAL/PLATELET  ?COMPREHENSIVE METABOLIC PANEL  ?BRAIN NATRIURETIC PEPTIDE  ?URINALYSIS, ROUTINE W REFLEX MICROSCOPIC  ? ? ?EKG ?EKG Interpretation ? ?Date/Time:  Saturday June 01 2021 21:22:16 EDT ?Ventricular Rate:  146 ?PR Interval:  220 ?QRS Duration: 82 ?QT Interval:  275 ?QTC Calculation: 429 ?R Axis:   26 ?Text Interpretation: Supraventricular tachycardia Borderline low voltage, extremity leads Repolarization abnormality, prob rate related Confirmed by Lacretia Leigh ([id number]) on 06/01/2021 10:27:09 PM ? ?Radiology ?No results found. ? ?Procedures ?Procedures  ? ? ?Medications Ordered in ED ?Medications - No data to display ? ?ED Course/ Medical Decision Making/ A&P ?  ?                        ?Medical Decision Making ?Amount and/or  Complexity of Data Reviewed ?Labs: ordered. ?Radiology: ordered. ?ECG/medicine tests: ordered. ? ?Risk ?Prescription drug management. ? ? ?Patient's EKG per my interpretation shows sinus tachycardia.  Chest x-ray per my interpretation shows pulmonary edema versus likely infiltrate.  Considered sepsis as etiology of her current symptoms.  Lactate is 1.9 which is high normal.  She is afebrile here and she has a leukocytosis however I do believe she likely has pneumonia.  I will  hold off given her fluid bolus as she is at risk for fluid overload.  Patient has a valid DNR at the bedside.  Plan will be to admit to the hospital service ? ? ? ? ? ? ? ?Final Clinical Impression(s) / ED Diagnoses ?Final diagnoses:  ?None  ? ? ?Rx / DC Orders ?ED Discharge Orders   ? ? None  ? ?  ? ? ?  ?Lacretia Leigh, MD ?06/01/21 2251 ? ?

## 2021-06-01 NOTE — ED Triage Notes (Signed)
Pt to ED via [facility] from [facility] for shortness of breath. Staff informed EMS that pt was c/o shortness of breath and they checked patient's VS and pt's BP=70/47, HR=150s so they called EMS. EMS states pt received 361mL bolus en route and BP=96/60. Pt A&Ox3 able to speak in short sentences on arrival, states she has been short of breath for a day or two.  ? ?EMS VS: ?96/60 ?HR=144 ?02=97% on 3LNC ?Fsbs=153 ? ?

## 2021-06-02 ENCOUNTER — Encounter (HOSPITAL_COMMUNITY): Payer: Self-pay | Admitting: Internal Medicine

## 2021-06-02 DIAGNOSIS — J9601 Acute respiratory failure with hypoxia: Secondary | ICD-10-CM | POA: Diagnosis not present

## 2021-06-02 DIAGNOSIS — J90 Pleural effusion, not elsewhere classified: Secondary | ICD-10-CM | POA: Diagnosis present

## 2021-06-02 DIAGNOSIS — E43 Unspecified severe protein-calorie malnutrition: Secondary | ICD-10-CM | POA: Diagnosis present

## 2021-06-02 DIAGNOSIS — I471 Supraventricular tachycardia: Secondary | ICD-10-CM | POA: Diagnosis present

## 2021-06-02 DIAGNOSIS — I272 Pulmonary hypertension, unspecified: Secondary | ICD-10-CM | POA: Diagnosis present

## 2021-06-02 LAB — COMPREHENSIVE METABOLIC PANEL
ALT: 9 U/L (ref 0–44)
AST: 12 U/L — ABNORMAL LOW (ref 15–41)
Albumin: 2.5 g/dL — ABNORMAL LOW (ref 3.5–5.0)
Alkaline Phosphatase: 71 U/L (ref 38–126)
Anion gap: 7 (ref 5–15)
BUN: 11 mg/dL (ref 8–23)
CO2: 33 mmol/L — ABNORMAL HIGH (ref 22–32)
Calcium: 8 mg/dL — ABNORMAL LOW (ref 8.9–10.3)
Chloride: 90 mmol/L — ABNORMAL LOW (ref 98–111)
Creatinine, Ser: 0.66 mg/dL (ref 0.44–1.00)
GFR, Estimated: >60 mL/min (ref >60)
Glucose, Bld: 106 mg/dL — ABNORMAL HIGH (ref 70–99)
Potassium: 4.2 mmol/L (ref 3.5–5.1)
Sodium: 130 mmol/L — ABNORMAL LOW (ref 135–145)
Total Bilirubin: 0.2 mg/dL — ABNORMAL LOW (ref 0.3–1.2)
Total Protein: 5.2 g/dL — ABNORMAL LOW (ref 6.5–8.1)

## 2021-06-02 LAB — URINALYSIS, ROUTINE W REFLEX MICROSCOPIC
Bilirubin Urine: NEGATIVE
Glucose, UA: NEGATIVE mg/dL
Ketones, ur: NEGATIVE mg/dL
Nitrite: NEGATIVE
Protein, ur: NEGATIVE mg/dL
Specific Gravity, Urine: <1.005 — ABNORMAL LOW (ref 1.005–1.030)
pH: 5.5 (ref 5.0–8.0)

## 2021-06-02 LAB — CBC
HCT: 27.4 % — ABNORMAL LOW (ref 36.0–46.0)
Hemoglobin: 8.8 g/dL — ABNORMAL LOW (ref 12.0–15.0)
MCH: 29.7 pg (ref 26.0–34.0)
MCHC: 32.1 g/dL (ref 30.0–36.0)
MCV: 92.6 fL (ref 80.0–100.0)
Platelets: 460 10*3/uL — ABNORMAL HIGH (ref 150–400)
RBC: 2.96 MIL/uL — ABNORMAL LOW (ref 3.87–5.11)
RDW: 14.2 % (ref 11.5–15.5)
WBC: 6.1 10*3/uL (ref 4.0–10.5)
nRBC: 0 % (ref 0.0–0.2)

## 2021-06-02 LAB — STREP PNEUMONIAE URINARY ANTIGEN: Strep Pneumo Urinary Antigen: NEGATIVE

## 2021-06-02 LAB — MRSA NEXT GEN BY PCR, NASAL: MRSA by PCR Next Gen: NOT DETECTED

## 2021-06-02 LAB — HIV ANTIBODY (ROUTINE TESTING W REFLEX): HIV Screen 4th Generation wRfx: NONREACTIVE

## 2021-06-02 LAB — DIGOXIN LEVEL: Digoxin Level: 1.7 ng/mL (ref 0.8–2.0)

## 2021-06-02 LAB — URINALYSIS, MICROSCOPIC (REFLEX)

## 2021-06-02 LAB — TROPONIN I (HIGH SENSITIVITY)
Troponin I (High Sensitivity): 34 ng/L — ABNORMAL HIGH (ref <18)
Troponin I (High Sensitivity): 28 ng/L — ABNORMAL HIGH (ref <18)

## 2021-06-02 LAB — BRAIN NATRIURETIC PEPTIDE: B Natriuretic Peptide: 249.3 pg/mL — ABNORMAL HIGH (ref 0.0–100.0)

## 2021-06-02 LAB — TSH: TSH: 3.692 u[IU]/mL (ref 0.350–4.500)

## 2021-06-02 MED ORDER — ACETAMINOPHEN 325 MG PO TABS
650.0000 mg | ORAL_TABLET | Freq: Four times a day (QID) | ORAL | Status: DC | PRN
  Administered 2021-06-02: 650 mg via ORAL
  Filled 2021-06-02: qty 2

## 2021-06-02 MED ORDER — SODIUM CHLORIDE 0.9 % IV SOLN
2.0000 g | INTRAVENOUS | Status: AC
  Administered 2021-06-02 – 2021-06-06 (×5): 2 g via INTRAVENOUS
  Filled 2021-06-02 (×6): qty 20

## 2021-06-02 MED ORDER — CHLORHEXIDINE GLUCONATE CLOTH 2 % EX PADS
6.0000 | MEDICATED_PAD | Freq: Every day | CUTANEOUS | Status: DC
Start: 2021-06-02 — End: 2021-06-05
  Administered 2021-06-02 – 2021-06-05 (×5): 6 via TOPICAL

## 2021-06-02 MED ORDER — SERTRALINE HCL 25 MG PO TABS
25.0000 mg | ORAL_TABLET | Freq: Every day | ORAL | Status: DC
  Administered 2021-06-02 – 2021-06-11 (×10): 25 mg via ORAL
  Filled 2021-06-02 (×10): qty 1

## 2021-06-02 MED ORDER — FLUTICASONE FUROATE-VILANTEROL 100-25 MCG/ACT IN AEPB
1.0000 | INHALATION_SPRAY | Freq: Every day | RESPIRATORY_TRACT | Status: DC
  Administered 2021-06-02 – 2021-06-11 (×10): 1 via RESPIRATORY_TRACT
  Filled 2021-06-02: qty 28

## 2021-06-02 MED ORDER — SODIUM CHLORIDE 0.9 % IV SOLN
500.0000 mg | INTRAVENOUS | Status: DC
  Administered 2021-06-02 – 2021-06-04 (×3): 500 mg via INTRAVENOUS
  Filled 2021-06-02 (×3): qty 5

## 2021-06-02 MED ORDER — IPRATROPIUM-ALBUTEROL 0.5-2.5 (3) MG/3ML IN SOLN
3.0000 mL | Freq: Four times a day (QID) | RESPIRATORY_TRACT | Status: DC | PRN
  Administered 2021-06-05 – 2021-06-09 (×3): 3 mL via RESPIRATORY_TRACT
  Filled 2021-06-02 (×3): qty 3

## 2021-06-02 MED ORDER — DIGOXIN 125 MCG PO TABS
0.1250 mg | ORAL_TABLET | Freq: Every day | ORAL | Status: DC
  Administered 2021-06-02 – 2021-06-11 (×10): 0.125 mg via ORAL
  Filled 2021-06-02 (×11): qty 1

## 2021-06-02 MED ORDER — ALBUMIN HUMAN 25 % IV SOLN
25.0000 g | Freq: Once | INTRAVENOUS | Status: AC
  Administered 2021-06-02: 25 g via INTRAVENOUS
  Filled 2021-06-02: qty 100

## 2021-06-02 MED ORDER — UMECLIDINIUM BROMIDE 62.5 MCG/ACT IN AEPB
1.0000 | INHALATION_SPRAY | Freq: Every day | RESPIRATORY_TRACT | Status: DC
  Administered 2021-06-02 – 2021-06-11 (×10): 1 via RESPIRATORY_TRACT
  Filled 2021-06-02: qty 7

## 2021-06-02 MED ORDER — FUROSEMIDE 10 MG/ML IJ SOLN
60.0000 mg | Freq: Two times a day (BID) | INTRAMUSCULAR | Status: AC
  Administered 2021-06-02 – 2021-06-03 (×2): 60 mg via INTRAVENOUS
  Filled 2021-06-02 (×3): qty 6

## 2021-06-02 MED ORDER — AMIODARONE LOAD VIA INFUSION
150.0000 mg | Freq: Once | INTRAVENOUS | Status: AC
  Administered 2021-06-02: 150 mg via INTRAVENOUS
  Filled 2021-06-02: qty 83.34

## 2021-06-02 MED ORDER — SODIUM CHLORIDE 0.9 % IV BOLUS: 500.0000 mL | Freq: Once | INTRAVENOUS | Status: DC

## 2021-06-02 MED ORDER — FLECAINIDE ACETATE 50 MG PO TABS
75.0000 mg | ORAL_TABLET | Freq: Two times a day (BID) | ORAL | Status: DC
  Filled 2021-06-02: qty 2

## 2021-06-02 MED ORDER — ORAL CARE MOUTH RINSE
15.0000 mL | Freq: Two times a day (BID) | OROMUCOSAL | Status: DC
  Administered 2021-06-02 – 2021-06-10 (×15): 15 mL via OROMUCOSAL

## 2021-06-02 MED ORDER — ADENOSINE 6 MG/2ML IV SOLN
6.0000 mg | Freq: Once | INTRAVENOUS | Status: AC
  Administered 2021-06-02: 6 mg via INTRAVENOUS
  Filled 2021-06-02: qty 2

## 2021-06-02 MED ORDER — AMIODARONE HCL IN DEXTROSE 360-4.14 MG/200ML-% IV SOLN
60.0000 mg/h | INTRAVENOUS | Status: AC
  Administered 2021-06-02 (×2): 60 mg/h via INTRAVENOUS
  Filled 2021-06-02 (×2): qty 200

## 2021-06-02 MED ORDER — ACETAMINOPHEN 650 MG RE SUPP: 650.0000 mg | Freq: Four times a day (QID) | RECTAL | Status: DC | PRN

## 2021-06-02 MED ORDER — AMIODARONE HCL IN DEXTROSE 360-4.14 MG/200ML-% IV SOLN
30.0000 mg/h | INTRAVENOUS | Status: DC
Start: 2021-06-02 — End: 2021-06-03
  Administered 2021-06-02 – 2021-06-03 (×3): 30 mg/h via INTRAVENOUS
  Filled 2021-06-02 (×2): qty 200

## 2021-06-02 MED ORDER — MIDODRINE HCL 5 MG PO TABS
10.0000 mg | ORAL_TABLET | Freq: Three times a day (TID) | ORAL | Status: DC
  Administered 2021-06-02 (×2): 10 mg via ORAL
  Filled 2021-06-02 (×2): qty 2

## 2021-06-02 MED ORDER — COVID-19MRNA BIVAL VACC PFIZER 30 MCG/0.3ML IM SUSP
0.3000 mL | Freq: Once | INTRAMUSCULAR | Status: DC
Start: 2021-06-02 — End: 2021-06-11
  Filled 2021-06-02: qty 0.3

## 2021-06-02 MED ORDER — ENSURE ENLIVE PO LIQD
237.0000 mL | Freq: Three times a day (TID) | ORAL | Status: DC
  Administered 2021-06-02 – 2021-06-04 (×5): 237 mL via ORAL
  Filled 2021-06-02: qty 237

## 2021-06-02 MED ORDER — APIXABAN 5 MG PO TABS
5.0000 mg | ORAL_TABLET | Freq: Two times a day (BID) | ORAL | Status: DC
  Administered 2021-06-02 – 2021-06-11 (×20): 5 mg via ORAL
  Filled 2021-06-02 (×20): qty 1

## 2021-06-02 NOTE — ED Notes (Signed)
Pt's HR=142 again, MD notified. ?

## 2021-06-02 NOTE — Progress Notes (Signed)
Patient had a run of v-tach on her screen in her room and informed this nurse, "I do not feel well." Obtained an EKG and informed charge nurse. Called telemetry to verify and Katrina stated it looked like artifact.  ?

## 2021-06-02 NOTE — ED Notes (Signed)
Pt returned from CT scan.

## 2021-06-02 NOTE — TOC Initial Note (Signed)
Transition of Care (TOC) - Initial/Assessment Note  ? ? ?Patient Details  ?Name: Samantha Fitzgerald ?MRN: [id number] ?Date of Birth: [birth year] ? ?Transition of Care (TOC) CM/SW Contact:    ?Tawanna Cooler, RN ?Phone Number: ?06/02/2021, 10:04 AM ? ?Clinical Narrative:                 ? ?Patient from [facility].  Is a long term resident there.  TOC CM following.   ? ? ?Expected Discharge Plan: [place] ?Barriers to Discharge: Continued Medical Work up ? ?Expected Discharge Plan and Services ?Expected Discharge Plan: [place] ?  ?   ?Living arrangements for the past 2 months: [place] ?                ? ?Prior Living Arrangements/Services ?Living arrangements for the past 2 months: [place] ?  ?Patient language and need for interpreter reviewed:: Yes ?       ?Need for Family Participation in Patient Care: Yes (Comment) ?Care giver support system in place?: Yes (comment) ?  ?Criminal Activity/Legal Involvement Pertinent to Current Situation/Hospitalization: No - Comment as needed ? ?Activities of Daily Living ?Home Assistive Devices/Equipment: Gilford Rile (specify type), Cane (specify quad or straight) ?ADL Screening (condition at time of admission) ?Patient's cognitive ability adequate to safely complete daily activities?: No ?Is the patient deaf or have difficulty hearing?: Yes ?Does the patient have difficulty seeing, even when wearing glasses/contacts?: Yes ?Does the patient have difficulty concentrating, remembering, or making decisions?: Yes ?Patient able to express need for assistance with ADLs?: Yes ?Does the patient have difficulty dressing or bathing?: Yes ?Independently performs ADLs?: No ?Communication: Independent ?Dressing (OT): Needs assistance ?Is this a change from baseline?: Pre-admission baseline ?Grooming: Needs assistance ?Is this a change from baseline?: Pre-admission baseline ?Feeding: Independent ?Bathing: Needs  assistance ?Is this a change from baseline?: Pre-admission baseline ?Toileting: Needs assistance ?Is this a change from baseline?: Pre-admission baseline ?In/Out Bed: Needs assistance ?Is this a change from baseline?: Pre-admission baseline ?Walks in Home: Needs assistance ?Is this a change from baseline?: Pre-admission baseline ?Does the patient have difficulty walking or climbing stairs?: Yes ?Weakness of Legs: Both ?Weakness of Arms/Hands: None ? ?  ?  ?Alcohol / Substance Use: Not Applicable ?Psych Involvement: No (comment) ? ?Admission diagnosis:  Acute respiratory failure with hypoxia ([place]) [J96.01] ?Acute respiratory failure with hypoxemia ([place]) [J96.01] ?Community acquired pneumonia, unspecified laterality [J18.9] ?Patient Active Problem List  ? Diagnosis Date Noted  ? Paroxysmal SVT (supraventricular tachycardia) ([place]) 06/02/2021  ? Acute respiratory failure with hypoxemia ([place]) 06/02/2021  ? Pulmonary hypertension ([place]) 06/02/2021  ? Acute respiratory failure with hypoxia ([place]) 06/01/2021  ? Acute on chronic respiratory failure with hypoxia ([place]) 05/08/2021  ? COPD with acute exacerbation ([place]) 05/08/2021  ? History of DVT (deep vein thrombosis) 05/08/2021  ? Pressure injury of skin 02/27/2021  ? Acute on chronic respiratory failure with hypoxemia ([place]) 02/26/2021  ? Bacteremia   ? Palliative care by specialist   ? Goals of care, counseling/discussion   ? General weakness   ? Sepsis ([place]) 12/03/2020  ? COVID-19 virus infection 12/03/2020  ? Acute lower UTI 12/03/2020  ? Cellulitis 12/03/2020  ? Chronic diastolic CHF (congestive heart failure) ([place]) 12/03/2020  ? Hypoalbuminemia 12/03/2020  ? AKI (acute kidney injury) ([place]) 12/03/2020  ? Pleural effusion on left 09/12/2020  ? COPD (chronic obstructive pulmonary disease) ([place]) 09/12/2020  ? Sick sinus syndrome ([place]) 09/12/2020  ?  Hypoxia 09/12/2020  ? ?PCP:  System, Provider Not In ?Pharmacy:   ?[facility], [place] ?[place] [address] ?Phone: [phone] Fax: [phone] ? ? ?Readmission Risk Interventions ?Readmission Risk Prevention Plan 06/02/2021 05/09/2021 02/27/2021  ?Transportation Screening Complete Complete Complete  ?PCP or Specialist Appt within 3-5 Days Complete Complete Not Complete  ?Not Complete comments - - Patient will discharge back to [facility].  ?[place] or Home Care Consult Complete Complete Not Complete  ?[place] or Home Care Consult comments - - Patient will discharge to SNF.  ?Social Work Consult for [place] Planning/Counseling Complete Complete Complete  ?Palliative Care Screening Complete Complete Not Applicable  ?Medication Review Press photographer) Complete Complete -  ? ? ? ?

## 2021-06-02 NOTE — Progress Notes (Addendum)
?PROGRESS NOTE ? ? ? ?Samantha Fitzgerald  PXT:[id number] DOB: 1924-04-20 DOA: 06/01/2021 ?PCP: System, Provider Not In  ? ? ? ?Brief Narrative:  ?Samantha Fitzgerald is a 88 y.o. WF (retired Therapist, sports) PMHx  COPD, chronic respiratory failure with hypoxia on 3 L O2 via [place], malignant neoplasm of bronchus and lung, Pleural Effusion, chronic diastolic CHF, essential HTN, PSVT, pulmonary HTN, Sick Sinus Syndrome, Hx  DVT  ? ?Brought to the ER after patient was complaining of shortness of breath.  Patient denies any chest pain nausea vomiting abdominal pain productive cough fever chills.  Unable to reach patient's daughter at this time. ?  ?ED Course: In the ER patient was hypoxic and tachycardic with heart rate in the 140s.  Chest x-ray was showing features concerning for possible pneumonia versus edema.  COVID test was negative.  CT angiogram of the chest was done which was negative for pulmonary embolism but did show bilateral pleural effusion and groundglass opacities concerning for pneumonia.  Patient admitted for further work-up. ?  ? ? ?Subjective: ?A/O x4, positive acute on chronic respiratory failure with hypoxia.  Normally on 3 L O2 at home ? ? ?Assessment & Plan: ?Covid vaccination; vaccinated [employer].  Requests Bivalent booster.  Placed order on 3/19 ?  ?Principal Problem: ?  Acute respiratory failure with hypoxia ([place]) ?Active Problems: ?  Pleural effusion on left ?  COPD (chronic obstructive pulmonary disease) ([place]) ?  Sick sinus syndrome ([place]) ?  Chronic diastolic CHF (congestive heart failure) ([place]) ?  Acute on chronic respiratory failure with hypoxemia (HCC) ?  History of DVT (deep vein thrombosis) ?  Paroxysmal SVT (supraventricular tachycardia) (HCC) ?  Acute respiratory failure with hypoxemia (HCC) ?  Pulmonary hypertension ([place]) ?  Bilateral pleural effusion ?  Protein-calorie malnutrition, severe ([place]) ? ?Acute on chronic respiratory failure with hypoxia ?-Incentive spirometry ?- Flutter  valve ?- DuoNeb QID PRN ?- Complete 5 days of antibiotics ? ? ?COPD exacerbation ?-See respiratory failure ? ? ?Bilateral pleural effusion ?-3/19 albumin 50 g + Lasix IV 60 mg BID x2 doses ? ?Chronic diastolic CHF? ?- Previous echocardiogram 12/08/2020, indeterminate for CHF ?-3/18 BNP mildly elevated= 249.3 ?- 3/19 Echocardiogram pending ?-Strict in and out ?- Daily weight ? ?SVT ?-Per admission note 3/19 discussed with on-call cardiologist Dr. Zigmund Daniel.  Since patient blood pressure is on the low side Dr. Zigmund Daniel advise giving adenosine 6 mg IV and if it does not improve then to start amiodarone but to hold off flecainide.  After giving 1 dose of 6 mg IV adenosine patient's heart rate briefly went into sinus for about 2 minutes then went back again into SVT.  Amiodarone was started per pharmacy protocol. ?-Digoxin ?-Restart Flecainide once Amiodarone titrated to off ? ? ?Severe protein calorie malnutrition ?-3/19 consult nutrition recommendation ? ?Hyponatremia ?-could be from fluid overload. ? ?Hx DVT ?- On Eliquis ? ?Chronic anemia ?Lab Results  ?Component Value Date  ? HGB 8.8 (L) 06/02/2021  ? HGB 9.6 (L) 06/01/2021  ? HGB 9.6 (L) 05/09/2021  ? HGB 9.5 (L) 05/08/2021  ? HGB 9.4 (L) 05/08/2021  ? ? ? ? ? ?Pressure Injury 02/27/21 Sacrum Medial Stage 1 -  Intact skin with non-blanchable redness of a localized area usually over a bony prominence. (Active)  ?02/27/21 0200  ?Location: Sacrum  ?Location Orientation: Medial  ?Staging: Stage 1 -  Intact skin with non-blanchable redness of a localized area usually over a bony prominence.  ?Wound Description (Comments):   ?  Present on Admission: Yes  ?   ?Pressure Injury 06/02/21 Sacrum Stage 2 -  Partial thickness loss of dermis presenting as a shallow open injury with a red, pink wound bed without slough. stage 2 with non-blanchable redness (Active)  ?06/02/21 0404  ?Location: Sacrum  ?Location Orientation:   ?Staging: Stage 2 -  Partial thickness loss of dermis  presenting as a shallow open injury with a red, pink wound bed without slough.  ?Wound Description (Comments): stage 2 with non-blanchable redness  ?Present on Admission: Yes  ?   ?Pressure Injury 06/02/21 Vertebral column Bilateral Stage 1 -  Intact skin with non-blanchable redness of a localized area usually over a bony prominence. (Active)  ?06/02/21 0404  ?Location: Vertebral column  ?Location Orientation: Bilateral  ?Staging: Stage 1 -  Intact skin with non-blanchable redness of a localized area usually over a bony prominence.  ?Wound Description (Comments):   ?Present on Admission: Yes  ? ?Goals of care ?- 3/19 [facility] consult: Patient just moved up here from [facility] and resides in [facility].  Does not have PCP or cardiologist.  Please arrange for PCP ? ?  ? ?Mobility Assessment (last 72 hours)   ? ? Mobility Assessment   ?No documentation. ? ?  ?  ? ?  ? ? ?DVT prophylaxis: Eliquis ?Code Status: DNR ?Family Communication: 3/19 daughter at bedside for discussion of plan of care all questions answered ?Status is: Inpatient ? ? ? ?Dispo: The patient is from: [facility] ?             Anticipated d/c is to: [facility] ?             Anticipated d/c date is: 3 days ?             Patient currently is not medically stable to d/c. ? ? ? ? ? ?Consultants:  ? ? ?Procedures/Significant Events:  ?3/18 CTA  PE protocol: ?No evidence of pulmonary embolism. ?2. Moderate pleural effusion on the right large pleural effusion on ?the left with associated compressive atelectasis. ?3. Ground-glass opacities in the lungs bilaterally, possible edema ?or infiltrate. ?4. Aortic atherosclerosis and coronary artery calcifications. ?5. Distended pulmonary trunk may be associated with underlying ?pulmonary artery hypertension. There is reflux of contrast into the ?inferior vena cava and hepatic veins suggesting right heart failure  ? ? ?I have personally reviewed and interpreted all radiology studies and my findings are  as above. ? ?VENTILATOR SETTINGS: ?Nasal Canula 3/19 ?Flow 3L/min ?Spo2 94% ? ? ?Cultures ?3/18 blood pending ?3/19 respiratory virus panel pending ?3/19 strep pneumo pending ?3/19 Legionella pending ?3/19 urine pending ? ? ? ?Antimicrobials: ?Anti-infectives (From admission, onward)  ? ? Start     Ordered Stop  ? 06/02/21 2300  cefTRIAXone (ROCEPHIN) 2 g in sodium chloride 0.9 % 100 mL IVPB       ? 06/02/21 0046 06/07/21 2259  ? 06/02/21 2300  azithromycin (ZITHROMAX) 500 mg in sodium chloride 0.9 % 250 mL IVPB       ? 06/02/21 0046 06/07/21 2259  ? 06/01/21 2230  levofloxacin (LEVAQUIN) IVPB 750 mg       ? 06/01/21 2229 06/02/21 0026  ? ?  ?  ? ? ?Devices ?  ? ?LINES / TUBES:  ? ? ? ? ?Continuous Infusions: ? albumin human    ? amiodarone 30 mg/hr (06/02/21 0919)  ? azithromycin    ? cefTRIAXone (ROCEPHIN)  IV    ? ? ? ?  Objective: ?Vitals:  ? 06/02/21 1000 06/02/21 1030 06/02/21 1100 06/02/21 1130  ?BP: (!) 126/50 (!) 131/40 (!) 131/39 (!) 150/44  ?Pulse: 64 62 62 68  ?Resp: (!) 22     ?Temp:      ?TempSrc:      ?SpO2: 97% 99% 100% 99%  ?Weight:      ?Height:      ? ? ?Intake/Output Summary (Last 24 hours) at 06/02/2021 1150 ?Last data filed at 06/02/2021 0800 ?Gross per 24 hour  ?Intake 308.98 ml  ?Output 200 ml  ?Net 108.98 ml  ? ?Filed Weights  ? 06/01/21 2106  ?Weight: 54.4 kg  ? ? ?Examination: ? ?General: A/O x4, No acute respiratory distress, hard of hearing ?Eyes: negative scleral hemorrhage, negative anisocoria, negative icterus ?ENT: Negative Runny nose, negative gingival bleeding, ?Neck:  Negative scars, masses, torticollis, lymphadenopathy, JVD ?Lungs: Clear to auscultation bilaterally without wheezes or crackles ?Cardiovascular: Regular rate and rhythm without murmur gallop or rub normal S1 and S2 ?Abdomen: negative abdominal pain, nondistended, positive soft, bowel sounds, no rebound, no ascites, no appreciable mass ?Patient seen by [facility] physician 3/19 no charge ? ?.  ? ? ? ?Data Reviewed: Care during  the described time interval was provided by me .  I have reviewed this patient's available data, including medical history, events of note, physical examination, and all test results as part of my evaluation. ? ?

## 2021-06-02 NOTE — H&P (Signed)
History and Physical    Samantha Fitzgerald WJX:[id number] DOB: May 13, 1924 DOA: 06/01/2021  PCP: System, Provider Not In  Patient coming from: Skilled nursing facility.  Chief Complaint: Shortness of breath.  HPI: Samantha Fitzgerald is a 88 y.o. female with history of COPD, CHF, PSVT prior history of DVT was brought to the ER after patient was complaining of shortness of breath.  Patient denies any chest pain nausea vomiting abdominal pain productive cough fever chills.  Unable to reach patient's daughter at this time.  ED Course: In the ER patient was hypoxic and tachycardic with heart rate in the 140s.  Chest x-ray was showing features concerning for possible pneumonia versus edema.  COVID test was negative.  CT angiogram of the chest was done which was negative for pulmonary embolism but did show bilateral pleural effusion and groundglass opacities concerning for pneumonia.  Patient admitted for further work-up.  Review of Systems: As per HPI, rest all negative.   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 No pertinent surgical history.   reports that she quit smoking about 57 years ago. Her smoking use included cigarettes. She has a 25.00 pack-year smoking history. She has never been exposed to tobacco smoke. She has never used smokeless tobacco. She reports that she does not drink alcohol and does not  use drugs.  Allergies  Allergen Reactions   Amoxicillin Nausea Only and Other (See Comments)    Listed on MAR   Doxycycline Nausea Only and Other (See Comments)    Listed on MAR   Levofloxacin Nausea Only and Other (See Comments)    Not documented on the [facility]   Morphine And Related Nausea Only and Other (See Comments)    Listed on MAR    Family History  Problem Relation Age of Onset   Hypertension Other     Prior to Admission medications   Medication Sig Start Date End Date Taking? Authorizing Provider  albuterol (VENTOLIN HFA) 108 (90 Base) MCG/ACT inhaler Inhale 2 puffs into the lungs in the morning and at bedtime.   Yes [provider]  albuterol (VENTOLIN HFA) 108 (90 Base) MCG/ACT inhaler Inhale 2 puffs into the lungs every 4 (four) hours as needed for wheezing.   Yes [provider]  apixaban (ELIQUIS) 5 MG TABS tablet Take 1 tablet (5 mg total) by mouth 2 (two) times daily. Patient taking differently: Take 5 mg by mouth every 12 (twelve) hours. 12/14/20  Yes Gherghe, Daylene Katayama, MD  digoxin (LANOXIN) 0.125 MG tablet Take 0.125 mg by mouth daily. Hold and notify MD if HR less than 60 04/25/20  Yes [provider]  Ensure (ENSURE) Take 237 mLs by mouth 3 (three) times daily between meals.   Yes [provider]  flecainide (TAMBOCOR) 50 MG tablet Take  75 mg by mouth 2 (two) times daily. for Sick Sinus Syndrome 07/27/20  Yes [provider]  furosemide (LASIX) 20 MG tablet Take 20 mg by mouth daily. 08/05/20  Yes [provider]  loperamide (IMODIUM A-D) 2 MG tablet Take 2 mg by mouth as needed for diarrhea or loose stools. After each subsequent loose BM, give 2 tabs   Yes [provider]  loperamide (IMODIUM A-D) 2 MG tablet Take 2-4 mg by mouth as needed (give 2 tabs after 1st loose BM; then 1 tab after each subsequent loose BM.).   Yes [provider]  midodrine (PROAMATINE) 10 MG tablet Take 10 mg by mouth in the  morning and at bedtime.   Yes [provider]  sertraline (ZOLOFT) 25 MG tablet Take 25 mg by mouth daily. 02/03/21  Yes [provider]  tamsulosin (FLOMAX) 0.4 MG CAPS capsule Take 1 capsule (0.4 mg total) by mouth daily after breakfast. 12/14/20  Yes Gherghe, Costin M, MD  TRELEGY ELLIPTA 100-62.5-25 MCG/INH AEPB Take 1 puff by mouth daily. 08/06/20  Yes [provider]  OXYGEN Inhale 2 L into the lungs continuous.    [provider]    Physical Exam: Constitutional: Moderately built and nourished. Vitals:   06/01/21 2300 06/01/21 2315 06/01/21 2330 06/02/21 0030  BP: (!) 85/60 95/61 (!) 87/56 (!) 89/55  Pulse: (!) 144 (!) 143 (!) 143 (!) 139  Resp: (!) 23 (!) 22 (!) 23 (!) 24  Temp:      TempSrc:      SpO2: 99% 100% 100% 100%  Weight:      Height:       Eyes: Anicteric no pallor. ENMT: No discharge from the ears eyes nose and mouth. Neck: No mass felt.  No neck rigidity. Respiratory: No rhonchi or crepitations. Cardiovascular: S1-S2 heard. Abdomen: Soft nontender bowel sound present. Musculoskeletal: No edema. Skin: No rash. Neurologic: Alert awake oriented to name and place moving all extremities. Psychiatric: Oriented to her name and place.   Labs on Admission: I have personally reviewed following labs and imaging studies  CBC: Recent Labs  Lab 06/01/21 2146  WBC 8.9  NEUTROABS 6.8  HGB 9.6*  HCT 30.0*  MCV 92.3  PLT 391   Basic Metabolic Panel: Recent Labs  Lab 06/01/21 2146  NA 129*  K 4.9  CL 92*  CO2 29  GLUCOSE 117*  BUN 11  CREATININE 0.69  CALCIUM 7.8*   GFR: Estimated Creatinine Clearance: 36.9 mL/min (by C-G formula based on SCr of 0.69 mg/dL). Liver Function Tests: Recent Labs  Lab 06/01/21 2146  AST 21  ALT 8  ALKPHOS 82  BILITOT 0.9  PROT 5.5*  ALBUMIN 2.5*   No results for input(s): LIPASE, AMYLASE in the last 168 hours. No results for input(s): AMMONIA in the last 168 hours. Coagulation  Profile: No results for input(s): INR, PROTIME in the last 168 hours. Cardiac Enzymes: No results for input(s): CKTOTAL, CKMB, CKMBINDEX, TROPONINI in the last 168 hours. BNP (last 3 results) No results for input(s): PROBNP in the last 8760 hours. HbA1C: No results for input(s): HGBA1C in the last 72 hours. CBG: No results for input(s): GLUCAP in the last 168 hours. Lipid Profile: No results for input(s): CHOL, HDL, LDLCALC, TRIG, CHOLHDL, LDLDIRECT in the last 72 hours. Thyroid Function Tests: No results for input(s): TSH, T4TOTAL, FREET4, T3FREE, THYROIDAB in the last 72 hours. Anemia Panel: No results for input(s): VITAMINB12, FOLATE, FERRITIN, TIBC, IRON, RETICCTPCT in  the last 72 hours. Urine analysis:    Component Value Date/Time   COLORURINE YELLOW 02/26/2021 1603   APPEARANCEUR CLEAR 02/26/2021 1603   LABSPEC 1.009 02/26/2021 1603   PHURINE 5.0 02/26/2021 1603   GLUCOSEU NEGATIVE 02/26/2021 1603   HGBUR NEGATIVE 02/26/2021 1603   BILIRUBINUR NEGATIVE 02/26/2021 1603   KETONESUR NEGATIVE 02/26/2021 1603   PROTEINUR NEGATIVE 02/26/2021 1603   NITRITE NEGATIVE 02/26/2021 1603   LEUKOCYTESUR SMALL (A) 02/26/2021 1603   Sepsis Labs: @LABRCNTIP (procalcitonin:4,lacticidven:4) ) Recent Results (from the past 240 hour(s))  Resp Panel by RT-PCR (Flu A&B, Covid) Peripheral     Status: None   Collection Time: 06/01/21 10:32 PM   Specimen: Peripheral; Nasopharyngeal(NP) swabs in vial transport medium  Result Value Ref Range Status   SARS Coronavirus 2 by RT PCR NEGATIVE NEGATIVE Final    Comment: (NOTE) SARS-CoV-2 target nucleic acids are NOT DETECTED.  The SARS-CoV-2 RNA is generally detectable in upper respiratory specimens during the acute phase of infection. The lowest concentration of SARS-CoV-2 viral copies this assay can detect is 138 copies/mL. A negative result does not preclude SARS-Cov-2 infection and should not be used as the sole basis for treatment or other  patient management decisions. A negative result may occur with  improper specimen collection/handling, submission of specimen other than nasopharyngeal swab, presence of viral mutation(s) within the areas targeted by this assay, and inadequate number of viral copies(<138 copies/mL). A negative result must be combined with clinical observations, patient history, and epidemiological information. The expected result is Negative.  Fact Sheet for Patients:  BloggerCourse.com  Fact Sheet for Healthcare Providers:  SeriousBroker.it  This test is no t yet approved or cleared by the [place] FDA and  has been authorized for detection and/or diagnosis of SARS-CoV-2 by FDA under an Emergency Use Authorization (EUA). This EUA will remain  in effect (meaning this test can be used) for the duration of the COVID-19 declaration under Section 564(b)(1) of the Act, 21 U.S.C.section 360bbb-3(b)(1), unless the authorization is terminated  or revoked sooner.       Influenza A by PCR NEGATIVE NEGATIVE Final   Influenza B by PCR NEGATIVE NEGATIVE Final    Comment: (NOTE) The Xpert Xpress SARS-CoV-2/FLU/RSV plus assay is intended as an aid in the diagnosis of influenza from Nasopharyngeal swab specimens and should not be used as a sole basis for treatment. Nasal washings and aspirates are unacceptable for Xpert Xpress SARS-CoV-2/FLU/RSV testing.  Fact Sheet for Patients: BloggerCourse.com  Fact Sheet for Healthcare Providers: SeriousBroker.it  This test is not yet approved or cleared by the [place] FDA and has been authorized for detection and/or diagnosis of SARS-CoV-2 by FDA under an Emergency Use Authorization (EUA). This EUA will remain in effect (meaning this test can be used) for the duration of the COVID-19 declaration under Section 564(b)(1) of the Act, 21 U.S.C. section  360bbb-3(b)(1), unless the authorization is terminated or revoked.  Performed at [facility], [address] [place], [place] [address]      Radiological Exams on Admission: CT Angio Chest Pulmonary Embolism (PE) W or WO Contrast  Result Date: 06/02/2021 CLINICAL DATA:  Pulmonary embolism suspected, high probability. Shortness of breath. EXAM: CT ANGIOGRAPHY CHEST WITH CONTRAST TECHNIQUE: Multidetector CT imaging of the chest was performed using the standard protocol during bolus administration of intravenous contrast. Multiplanar CT image reconstructions and MIPs were obtained to evaluate the vascular anatomy. RADIATION DOSE REDUCTION: This exam was performed according to the departmental dose-optimization program  which includes automated exposure control, adjustment of the mA and/or kV according to patient size and/or use of iterative reconstruction technique. CONTRAST:  75mL OMNIPAQUE IOHEXOL 350 MG/ML SOLN COMPARISON:  09/12/2020. FINDINGS: Cardiovascular: The heart is normal in size. Scattered coronary artery calcifications are noted. There is atherosclerotic calcification of the aorta without evidence of aneurysm. The pulmonary trunk is distended which may be associated with underlying pulmonary artery hypertension. No definite pulmonary artery filling defect is identified. Evaluation is limited due to infiltrates and effusions. Mediastinum/Nodes: No enlarged mediastinal, hilar, or axillary lymph nodes. Thyroid gland, trachea, and esophagus demonstrate no significant findings. Lungs/Pleura: There is a moderate pleural effusion on the right no large pleural effusion on the left with associated atelectasis bilaterally. Scattered ground-glass opacities are noted in the lungs bilaterally. No pneumothorax. Upper Abdomen: There is reflux of contrast into the inferior vena cava and hepatic veins. A right renal cyst is noted. Musculoskeletal: No acute fracture. Degenerative changes are  present in the thoracic spine. Review of the MIP images confirms the above findings. IMPRESSION: 1. No evidence of pulmonary embolism. 2. Moderate pleural effusion on the right large pleural effusion on the left with associated compressive atelectasis. 3. Ground-glass opacities in the lungs bilaterally, possible edema or infiltrate. 4. Aortic atherosclerosis and coronary artery calcifications. 5. Distended pulmonary trunk may be associated with underlying pulmonary artery hypertension. There is reflux of contrast into the inferior vena cava and hepatic veins suggesting right heart failure. Electronically Signed   By: Thornell Sartorius M.D.   On: 06/02/2021 00:26   DG Chest Port 1 View  Result Date: 06/01/2021 CLINICAL DATA:  Shortness of breath.  Hypotensive and tachycardic. EXAM: PORTABLE CHEST 1 VIEW COMPARISON:  05/08/2021. FINDINGS: The heart is enlarged the pulmonary vasculature is distended. Atherosclerotic calcification of the aorta is noted. Surgical clips are present at the hilar region on the right. Hazy airspace disease is present in the mid to lower lung fields bilaterally. There is a small right pleural effusion and a moderate left pleural effusion. No pneumothorax. No acute osseous abnormality. IMPRESSION: 1. Cardiomegaly with pulmonary vascular congestion. 2. Hazy airspace disease in the mid to lower lung fields bilaterally, possible edema or infiltrate. 3. Small right pleural effusion and moderate left pleural effusion. Electronically Signed   By: Thornell Sartorius M.D.   On: 06/01/2021 22:22    EKG: Independently reviewed.  Tachycardia.  Assessment/Plan Principal Problem:   Acute respiratory failure with hypoxia (HCC) Active Problems:   COPD (chronic obstructive pulmonary disease) (HCC)   History of DVT (deep vein thrombosis)   Paroxysmal SVT (supraventricular tachycardia) (HCC)   Acute respiratory failure with hypoxemia (HCC)    Acute respiratory failure hypoxia likely a combination of  CHF and possible pneumonia.  We will continue antibiotics for pneumonia but unable to give diuretics due to low blood pressure..  Patient's symptoms also could be precipitated by SVT.  See #2.  Last 2D echo was done in September 2022 which showed EF of 60 to 65%. SVT -discussed with on-call cardiologist Dr. Ashley Royalty.  Since patient blood pressure is on the low side Dr. Ashley Royalty advise giving adenosine 6 mg IV and if it does not improve then to start amiodarone but to hold off flecainide.  After giving 1 dose of 6 mg IV adenosine patient's heart rate briefly went into sinus for about 2 minutes then went back again into SVT.  Amiodarone was started per pharmacy protocol.  Patient is on Eliquis.  We will check troponins and  TSH.  We will continue digoxin.  Check digoxin levels. CHF see #1. Hyponatremia we will follow metabolic panel likely could be from fluid overload. History of DVT on Eliquis. COPD not actively wheezing. Chronic anemia follow CBC.  Since patient has SVT requiring amiodarone drip to control at this time low blood pressure and possible pneumonia will need inpatient status.  Unable to reach patient's daughter to get further history.   DVT prophylaxis: Eliquis. Code Status: DNR. Family Communication: Unable to reach patient's daughter. Disposition Plan: Back to facility when stable. Consults called: Discussed with cardiology. Admission status: Inpatient.   Eduard Clos MD [facility] Pager [phone].  If 7PM-7AM, please contact night-coverage [email or web address] Password [facility]  06/02/2021, 12:46 AM

## 2021-06-02 NOTE — ED Notes (Signed)
Adenosine 6mg  given w/Dr. Rosina Lowenstein at bedside. Pt HR decreased to 20, is now 91, NSR.  Repeat EKG obtained. ?

## 2021-06-02 NOTE — Plan of Care (Signed)
Care plan initiated.

## 2021-06-02 NOTE — Progress Notes (Incomplete)
Patient requested to  ?

## 2021-06-02 NOTE — Progress Notes (Signed)
Patient informed this nurse that she would like to hold off on her pfizer vaccine until tomorrow, 06/03/21, as she needs time to mentally process taking this vaccine.  ?

## 2021-06-03 ENCOUNTER — Inpatient Hospital Stay (HOSPITAL_COMMUNITY): Payer: Medicare Other

## 2021-06-03 DIAGNOSIS — J431 Panlobular emphysema: Secondary | ICD-10-CM

## 2021-06-03 DIAGNOSIS — I5021 Acute systolic (congestive) heart failure: Secondary | ICD-10-CM | POA: Diagnosis not present

## 2021-06-03 DIAGNOSIS — J9601 Acute respiratory failure with hypoxia: Secondary | ICD-10-CM | POA: Diagnosis not present

## 2021-06-03 DIAGNOSIS — J9621 Acute and chronic respiratory failure with hypoxia: Secondary | ICD-10-CM

## 2021-06-03 DIAGNOSIS — J90 Pleural effusion, not elsewhere classified: Secondary | ICD-10-CM | POA: Diagnosis not present

## 2021-06-03 DIAGNOSIS — I5032 Chronic diastolic (congestive) heart failure: Secondary | ICD-10-CM

## 2021-06-03 DIAGNOSIS — J189 Pneumonia, unspecified organism: Secondary | ICD-10-CM | POA: Diagnosis not present

## 2021-06-03 DIAGNOSIS — I272 Pulmonary hypertension, unspecified: Secondary | ICD-10-CM

## 2021-06-03 DIAGNOSIS — E43 Unspecified severe protein-calorie malnutrition: Secondary | ICD-10-CM

## 2021-06-03 DIAGNOSIS — Z86718 Personal history of other venous thrombosis and embolism: Secondary | ICD-10-CM

## 2021-06-03 DIAGNOSIS — I495 Sick sinus syndrome: Secondary | ICD-10-CM

## 2021-06-03 DIAGNOSIS — I471 Supraventricular tachycardia: Secondary | ICD-10-CM

## 2021-06-03 LAB — COMPREHENSIVE METABOLIC PANEL
ALT: 10 U/L (ref 0–44)
AST: 12 U/L — ABNORMAL LOW (ref 15–41)
Albumin: 3.2 g/dL — ABNORMAL LOW (ref 3.5–5.0)
Alkaline Phosphatase: 67 U/L (ref 38–126)
Anion gap: 7 (ref 5–15)
BUN: 9 mg/dL (ref 8–23)
CO2: 34 mmol/L — ABNORMAL HIGH (ref 22–32)
Calcium: 8.3 mg/dL — ABNORMAL LOW (ref 8.9–10.3)
Chloride: 93 mmol/L — ABNORMAL LOW (ref 98–111)
Creatinine, Ser: 0.54 mg/dL (ref 0.44–1.00)
GFR, Estimated: >60 mL/min (ref >60)
Glucose, Bld: 85 mg/dL (ref 70–99)
Potassium: 3.2 mmol/L — ABNORMAL LOW (ref 3.5–5.1)
Sodium: 134 mmol/L — ABNORMAL LOW (ref 135–145)
Total Bilirubin: 0.1 mg/dL — ABNORMAL LOW (ref 0.3–1.2)
Total Protein: 5.8 g/dL — ABNORMAL LOW (ref 6.5–8.1)

## 2021-06-03 LAB — URINE CULTURE: Culture: NO GROWTH

## 2021-06-03 LAB — CBC WITH DIFFERENTIAL/PLATELET
Abs Immature Granulocytes: 0.02 10*3/uL (ref 0.00–0.07)
Basophils Absolute: 0.1 10*3/uL (ref 0.0–0.1)
Basophils Relative: 1 %
Eosinophils Absolute: 0.3 10*3/uL (ref 0.0–0.5)
Eosinophils Relative: 5 %
HCT: 27.5 % — ABNORMAL LOW (ref 36.0–46.0)
Hemoglobin: 8.8 g/dL — ABNORMAL LOW (ref 12.0–15.0)
Immature Granulocytes: 0 %
Lymphocytes Relative: 20 %
Lymphs Abs: 1.3 10*3/uL (ref 0.7–4.0)
MCH: 29.1 pg (ref 26.0–34.0)
MCHC: 32 g/dL (ref 30.0–36.0)
MCV: 91.1 fL (ref 80.0–100.0)
Monocytes Absolute: 0.4 10*3/uL (ref 0.1–1.0)
Monocytes Relative: 6 %
Neutro Abs: 4.4 10*3/uL (ref 1.7–7.7)
Neutrophils Relative %: 68 %
Platelets: 438 10*3/uL — ABNORMAL HIGH (ref 150–400)
RBC: 3.02 MIL/uL — ABNORMAL LOW (ref 3.87–5.11)
RDW: 14.1 % (ref 11.5–15.5)
WBC: 6.5 10*3/uL (ref 4.0–10.5)
nRBC: 0 % (ref 0.0–0.2)

## 2021-06-03 LAB — RESPIRATORY PANEL BY PCR

## 2021-06-03 LAB — ECHOCARDIOGRAM COMPLETE
AR max vel: 2.24 cm2
AV Area VTI: 2.86 cm2
AV Area mean vel: 1.96 cm2
AV Mean grad: 1 mmHg
AV Peak grad: 2.3 mmHg
Ao pk vel: 0.76 m/s
Area-P 1/2: 3.03 cm2
Height: 66 in
S' Lateral: 1.9 cm
Single Plane A4C EF: 56.9 %
Weight: 1746.04 oz

## 2021-06-03 LAB — PHOSPHORUS: Phosphorus: 3.4 mg/dL (ref 2.5–4.6)

## 2021-06-03 LAB — MAGNESIUM: Magnesium: 1.7 mg/dL (ref 1.7–2.4)

## 2021-06-03 MED ORDER — POTASSIUM CHLORIDE 10 MEQ/100ML IV SOLN
10.0000 meq | INTRAVENOUS | Status: DC
  Filled 2021-06-03: qty 100

## 2021-06-03 MED ORDER — POTASSIUM CHLORIDE 20 MEQ PO PACK
40.0000 meq | PACK | Freq: Once | ORAL | Status: AC
  Administered 2021-06-03: 40 meq via ORAL
  Filled 2021-06-03: qty 2

## 2021-06-03 MED ORDER — PERFLUTREN LIPID MICROSPHERE
1.0000 mL | INTRAVENOUS | Status: AC | PRN
  Administered 2021-06-03: 2 mL via INTRAVENOUS
  Filled 2021-06-03: qty 10

## 2021-06-03 MED ORDER — MAGNESIUM SULFATE 2 GM/50ML IV SOLN
2.0000 g | Freq: Once | INTRAVENOUS | Status: AC
  Administered 2021-06-03: 2 g via INTRAVENOUS
  Filled 2021-06-03: qty 50

## 2021-06-03 MED ORDER — FLECAINIDE ACETATE 100 MG PO TABS
100.0000 mg | ORAL_TABLET | Freq: Two times a day (BID) | ORAL | Status: DC
  Administered 2021-06-03 – 2021-06-05 (×5): 100 mg via ORAL
  Filled 2021-06-03 (×6): qty 1

## 2021-06-03 MED ORDER — LORAZEPAM 2 MG/ML IJ SOLN
0.5000 mg | Freq: Four times a day (QID) | INTRAMUSCULAR | Status: DC | PRN
  Administered 2021-06-03 – 2021-06-10 (×4): 0.5 mg via INTRAVENOUS
  Filled 2021-06-03 (×4): qty 1

## 2021-06-03 NOTE — Progress Notes (Signed)
Stopping amiodarone gtt and transitioning to tambocor per Kennon Holter, NP.  ?

## 2021-06-03 NOTE — Progress Notes (Signed)
Alerted by IV watch of possible infiltration of IV amiodarone. Pain and redness noted going up vein. Amiodarone held and [place], NP consulted to see if IV can be transitioned to PO.  ?

## 2021-06-03 NOTE — Progress Notes (Signed)
?PROGRESS NOTE ? ? ? ?Samantha Fitzgerald  AOZ:[id number] DOB: 12-17-1924 DOA: 06/01/2021 ?PCP: System, Provider Not In  ? ? ? ?Brief Narrative:  ?Samantha Fitzgerald is a 88 y.o. WF (retired Therapist, sports) PMHx  COPD, chronic respiratory failure with hypoxia on 3 L O2 via [place], malignant neoplasm of bronchus and lung, Pleural Effusion, chronic diastolic CHF, essential HTN, PSVT, pulmonary HTN, Sick Sinus Syndrome, Hx  DVT  ? ?Brought to the ER after patient was complaining of shortness of breath.  Patient denies any chest pain nausea vomiting abdominal pain productive cough fever chills.  Unable to reach patient's daughter at this time. ?  ?ED Course: In the ER patient was hypoxic and tachycardic with heart rate in the 140s.  Chest x-ray was showing features concerning for possible pneumonia versus edema.  COVID test was negative.  CT angiogram of the chest was done which was negative for pulmonary embolism but did show bilateral pleural effusion and groundglass opacities concerning for pneumonia.  Patient admitted for further work-up. ?  ? ? ?Subjective: ?3/20 A/O x4.  Feels improved ? ? ?Assessment & Plan: ?Covid vaccination; vaccinated [employer].  Requests Bivalent booster.  Placed order on 3/19 ?  ?Principal Problem: ?  Acute respiratory failure with hypoxia ([place]) ?Active Problems: ?  Pleural effusion on left ?  COPD (chronic obstructive pulmonary disease) ([place]) ?  Sick sinus syndrome ([place]) ?  Chronic diastolic CHF (congestive heart failure) ([place]) ?  Acute on chronic respiratory failure with hypoxemia (HCC) ?  History of DVT (deep vein thrombosis) ?  Paroxysmal SVT (supraventricular tachycardia) (HCC) ?  Acute respiratory failure with hypoxemia (HCC) ?  Pulmonary hypertension ([place]) ?  Bilateral pleural effusion ?  Protein-calorie malnutrition, severe ([place]) ? ?Acute on chronic respiratory failure with hypoxia ?-Incentive spirometry ?- Flutter valve ?- DuoNeb QID PRN ?- Complete 5 days of antibiotics ? ? ?COPD  exacerbation ?-See respiratory failure ? ? ?Bilateral pleural effusion ?-3/19 albumin 50 g + Lasix IV 60 mg BID x2 doses ? ?Chronic diastolic CHF? ?- Previous echocardiogram 12/08/2020, indeterminate for CHF ?-3/18 BNP mildly elevated= 249.3 ?- 3/19 Echocardiogram pending ?-Strict in and out +623.34ml ?- Daily weight ?Filed Weights  ? 06/01/21 2106 06/02/21 0802  ?Weight: 54.4 kg 49.5 kg  ?  ? ?SVT ?-Per admission note 3/19 discussed with on-call cardiologist Dr. Zigmund Daniel.  Since patient blood pressure is on the low side Dr. Zigmund Daniel advise giving adenosine 6 mg IV and if it does not improve then to start amiodarone but to hold off flecainide.  After giving 1 dose of 6 mg IV adenosine patient's heart rate briefly went into sinus for about 2 minutes then went back again into SVT.  Amiodarone was started per pharmacy protocol. ?-Digoxin ?- 3/20 overnight Amiodarone titrated off.  Flecainide 100 mg BID started ? ? ?Severe protein calorie malnutrition ?-3/19 consult nutrition recommendation ? ?Hyponatremia ?-could be from fluid overload. ? ?Hypokalemia ?- Potassium goal>4 ?-3/20 potassium IV 50 mEq ? ?Hypomagnesmia ?- Magnesium goal> 2 ?- 3/20 magnesium IV 2 g ? ? ?Hx DVT ?- On Eliquis ? ?Chronic anemia ?Lab Results  ?Component Value Date  ? HGB 8.8 (L) 06/03/2021  ? HGB 8.8 (L) 06/02/2021  ? HGB 9.6 (L) 06/01/2021  ? HGB 9.6 (L) 05/09/2021  ? HGB 9.5 (L) 05/08/2021  ? ? ? ? ? ?Pressure Injury 02/27/21 Sacrum Medial Stage 1 -  Intact skin with non-blanchable redness of a localized area usually over a bony prominence. (Active)  ?  02/27/21 0200  ?Location: Sacrum  ?Location Orientation: Medial  ?Staging: Stage 1 -  Intact skin with non-blanchable redness of a localized area usually over a bony prominence.  ?Wound Description (Comments):   ?Present on Admission: Yes  ?   ?Pressure Injury 06/02/21 Sacrum Stage 2 -  Partial thickness loss of dermis presenting as a shallow open injury with a red, pink wound bed without slough.  stage 2 with non-blanchable redness (Active)  ?06/02/21 0404  ?Location: Sacrum  ?Location Orientation:   ?Staging: Stage 2 -  Partial thickness loss of dermis presenting as a shallow open injury with a red, pink wound bed without slough.  ?Wound Description (Comments): stage 2 with non-blanchable redness  ?Present on Admission: Yes  ?   ?Pressure Injury 06/02/21 Vertebral column Bilateral Stage 1 -  Intact skin with non-blanchable redness of a localized area usually over a bony prominence. (Active)  ?06/02/21 0404  ?Location: Vertebral column  ?Location Orientation: Bilateral  ?Staging: Stage 1 -  Intact skin with non-blanchable redness of a localized area usually over a bony prominence.  ?Wound Description (Comments):   ?Present on Admission: Yes  ? ?Goals of care ?- 3/19 [facility] consult: Patient just moved up here from [facility] and resides in [place] [facility].  Does not have PCP or cardiologist.  Please arrange for PCP ? ?  ? ?Mobility Assessment (last 72 hours)   ? ? Mobility Assessment   ?No documentation. ? ?  ?  ? ?  ? ? ?DVT prophylaxis: Eliquis ?Code Status: DNR ?Family Communication: 3/19 daughter at bedside for discussion of plan of care all questions answered ?Status is: Inpatient ? ? ? ?Dispo: The patient is from: [facility] ?             Anticipated d/c is to: [facility] [place] [facility] ?             Anticipated d/c date is: 3 days ?             Patient currently is not medically stable to d/c. ? ? ? ? ? ?Consultants:  ? ? ?Procedures/Significant Events:  ?3/18 CTA  PE protocol: ?No evidence of pulmonary embolism. ?2. Moderate pleural effusion on the right large pleural effusion on ?the left with associated compressive atelectasis. ?3. Ground-glass opacities in the lungs bilaterally, possible edema ?or infiltrate. ?4. Aortic atherosclerosis and coronary artery calcifications. ?5. Distended pulmonary trunk may be associated with underlying ?pulmonary artery hypertension. There is reflux of contrast into  the ?inferior vena cava and hepatic veins suggesting right heart failure  ?3/20 Echocardiogram ? Left Ventricle: Left ventricular ejection fraction, by estimation, is 60  ?to 65%. ? Left ventricular diastolic parameters  ?are indeterminate.  ? ?Right Ventricle: Right ventricular systolic  ?function is mildly reduced.  ? ? ?I have personally reviewed and interpreted all radiology studies and my findings are as above. ? ?VENTILATOR SETTINGS: ?Nasal Canula 3/20 ?Flow 3L/min ?Spo2 99% ? ? ?Cultures ?3/18 blood pending ?3/19 respiratory virus panel negative ?3/19 strep pneumo negative ?3/19 Legionella pending ?3/19 urine negative ?3/19 MRSA by PCR negative ?3/19 HIV screen negative ? ? ?Antimicrobials: ?Anti-infectives (From admission, onward)  ? ? Start     Ordered Stop  ? 06/02/21 2300  cefTRIAXone (ROCEPHIN) 2 g in sodium chloride 0.9 % 100 mL IVPB       ? 06/02/21 0046 06/07/21 2259  ? 06/02/21 2300  azithromycin (ZITHROMAX) 500 mg in sodium chloride 0.9 % 250 mL IVPB       ?  06/02/21 0046 06/07/21 2259  ? 06/01/21 2230  levofloxacin (LEVAQUIN) IVPB 750 mg       ? 06/01/21 2229 06/02/21 0026  ? ?  ?  ? ? ?Devices ?  ? ?LINES / TUBES:  ? ? ? ? ?Continuous Infusions: ? azithromycin Stopped (06/03/21 0039)  ? cefTRIAXone (ROCEPHIN)  IV Stopped (06/02/21 2327)  ? ? ? ?Objective: ?Vitals:  ? 06/03/21 0100 06/03/21 0200 06/03/21 0300 06/03/21 0813  ?BP: (!) 154/62 (!) 141/52    ?Pulse: 66 61    ?Resp: 19 15    ?Temp:   97.6 ?F (36.4 ?C)   ?TempSrc:   Oral   ?SpO2: 100% 93%  92%  ?Weight:      ?Height:      ? ? ?Intake/Output Summary (Last 24 hours) at 06/03/2021 6767 ?Last data filed at 06/02/2021 1713 ?Gross per 24 hour  ?Intake 219.06 ml  ?Output --  ?Net 219.06 ml  ? ? ?Filed Weights  ? 06/01/21 2106 06/02/21 0802  ?Weight: 54.4 kg 49.5 kg  ? ? ?Examination: ? ?General: A/O x4, No acute respiratory distress, hard of hearing ?Eyes: negative scleral hemorrhage, negative anisocoria, negative icterus ?ENT: Negative Runny  nose, negative gingival bleeding, ?Neck:  Negative scars, masses, torticollis, lymphadenopathy, JVD ?Lungs: Clear to auscultation bilaterally without wheezes or crackles ?Cardiovascular: Regular rate and rhythm wi

## 2021-06-03 NOTE — Progress Notes (Signed)
Initial Nutrition Assessment ? ?DOCUMENTATION CODES:  ? ?Severe malnutrition in context of chronic illness, Underweight ? ?INTERVENTION:  ?- continue Ensure Plus High Protein po BID, each supplement provides 350 kcal and 20 grams of protein. ?- will liberalize diet from Heart Healthy to Regular as patient does not eat much at baseline and does not strictly limit diet at facility. ?- encourage daughter (or any other visitors) to bring in things patient enjoys. ? ? ?NUTRITION DIAGNOSIS:  ? ?Severe Malnutrition related to chronic illness as evidenced by severe fat depletion, severe muscle depletion. ? ?GOAL:  ? ?Patient will meet greater than or equal to 90% of their needs ? ?MONITOR:  ? ?PO intake, Supplement acceptance, Labs, Weight trends, Skin ? ?REASON FOR ASSESSMENT:  ? ?Consult ?Assessment of nutrition requirement/status ? ?ASSESSMENT:  ? ?88 y.o. female with medical history of COPD, CHF, PSVT prior history of DVT, osteoporosis, dysphagia, GERD, HTN, anemia, HLD, protein-calorie malnutrition, and malignant neoplasm of bronchus and lung. She presented to the ED due to shortness of breath. She was admitted for acute on chronic respiratory failure with hypoxia. ? ?Patient laying in bed with her daughter at bedside. Patient is hard of hearing.  ? ?Ensure Enlive was ordered TID at the time of admission. A bottle was on bedside table and she had consumed ~1/3 of it. Patient agreeable to continue receiving this supplement. ? ?Patient resides at [facility]. She does watch her diet but not overly strictly and she typically eats small amounts so family would like for patient to eat whatever she wants. This morning she had a pancake for breakfast and was awaiting lunch at the time of RD visit.  ? ?No nutrition questions or concerns at this time.  ? ?Weight yesterday was 109 lb and weight on 03/02/21 was 111 lb. Continue to monitor closely.  ? ? ?Labs reviewed; Na: 134 mmol/l, K: 3.2 mmol/l, Cl: 93 mmol/l, Ca: 8.3  mg/dl. ? ?Medications reviewed; 60 mg IV lasix x2 doses 3/19, 2 g IV Mg sulfate x1 run 3/20, 40 mEq Klor-Con x1 dose 3/20. ?  ? ? ?NUTRITION - FOCUSED PHYSICAL EXAM: ? ?Flowsheet Row Most Recent Value  ?Orbital Region Moderate depletion  ?Upper Arm Region Severe depletion  ?Thoracic and Lumbar Region Unable to assess  ?Buccal Region Severe depletion  ?Temple Region Moderate depletion  ?Clavicle Bone Region Severe depletion  ?Clavicle and Acromion Bone Region Severe depletion  ?Scapular Bone Region Severe depletion  ?Dorsal Hand Severe depletion  ?Patellar Region Severe depletion  ?Anterior Thigh Region Severe depletion  ?Posterior Calf Region Moderate depletion  ?Edema (RD Assessment) None  ?Hair Reviewed  ?Eyes Reviewed  ?Mouth Reviewed  ?Nails Reviewed  ? ?  ? ? ?Diet Order:   ?Diet Order   ? ?       ?  Diet regular Room service appropriate? Yes; Fluid consistency: Thin  Diet effective now       ?  ? ?  ?  ? ?  ? ? ?EDUCATION NEEDS:  ? ?No education needs have been identified at this time ? ?Skin:  Skin Assessment: Skin Integrity Issues: ?Skin Integrity Issues:: Stage II, Stage I, Other (Comment) ?Stage I: vertebral column ?Stage II: sacrum ?Other: MASD to bilateral breasts and abdominal skin folds ? ?Last BM:  PTA/unknown ? ?Height:  ? ?Ht Readings from Last 1 Encounters:  ?06/01/21 5\' 6"  (1.676 m)  ? ? ?Weight:  ? ?Wt Readings from Last 1 Encounters:  ?06/02/21 49.5 kg  ? ? ?  BMI:  Body mass index is 17.61 kg/m?. ? ?Estimated Nutritional Needs:  ?Kcal:  1400-1600 kcal ?Protein:  70-80 grams ?Fluid:  >/= 1.6 L/day ? ? ? ? ?Jarome Matin, MS, RD, LDN ?Registered Dietitian II ?Inpatient Clinical Nutrition ?RD pager # and on-call/weekend pager # available in [place]  ? ?

## 2021-06-04 ENCOUNTER — Inpatient Hospital Stay (HOSPITAL_COMMUNITY): Payer: Medicare Other

## 2021-06-04 DIAGNOSIS — J9601 Acute respiratory failure with hypoxia: Secondary | ICD-10-CM | POA: Diagnosis not present

## 2021-06-04 DIAGNOSIS — J189 Pneumonia, unspecified organism: Secondary | ICD-10-CM | POA: Diagnosis not present

## 2021-06-04 DIAGNOSIS — L89151 Pressure ulcer of sacral region, stage 1: Secondary | ICD-10-CM | POA: Diagnosis present

## 2021-06-04 DIAGNOSIS — J90 Pleural effusion, not elsewhere classified: Secondary | ICD-10-CM | POA: Diagnosis not present

## 2021-06-04 DIAGNOSIS — J9621 Acute and chronic respiratory failure with hypoxia: Secondary | ICD-10-CM | POA: Diagnosis not present

## 2021-06-04 LAB — COMPREHENSIVE METABOLIC PANEL
ALT: 8 U/L (ref 0–44)
AST: 12 U/L — ABNORMAL LOW (ref 15–41)
Albumin: 2.7 g/dL — ABNORMAL LOW (ref 3.5–5.0)
Alkaline Phosphatase: 60 U/L (ref 38–126)
Anion gap: 5 (ref 5–15)
BUN: 11 mg/dL (ref 8–23)
CO2: 34 mmol/L — ABNORMAL HIGH (ref 22–32)
Calcium: 8 mg/dL — ABNORMAL LOW (ref 8.9–10.3)
Chloride: 95 mmol/L — ABNORMAL LOW (ref 98–111)
Creatinine, Ser: 0.45 mg/dL (ref 0.44–1.00)
GFR, Estimated: >60 mL/min (ref >60)
Glucose, Bld: 85 mg/dL (ref 70–99)
Potassium: 3.6 mmol/L (ref 3.5–5.1)
Sodium: 134 mmol/L — ABNORMAL LOW (ref 135–145)
Total Bilirubin: <0.1 mg/dL — ABNORMAL LOW (ref 0.3–1.2)
Total Protein: 5.1 g/dL — ABNORMAL LOW (ref 6.5–8.1)

## 2021-06-04 LAB — CBC WITH DIFFERENTIAL/PLATELET
Abs Immature Granulocytes: 0.02 10*3/uL (ref 0.00–0.07)
Basophils Absolute: 0.1 10*3/uL (ref 0.0–0.1)
Basophils Relative: 1 %
Eosinophils Absolute: 0.4 10*3/uL (ref 0.0–0.5)
Eosinophils Relative: 7 %
HCT: 26.6 % — ABNORMAL LOW (ref 36.0–46.0)
Hemoglobin: 8.5 g/dL — ABNORMAL LOW (ref 12.0–15.0)
Immature Granulocytes: 0 %
Lymphocytes Relative: 19 %
Lymphs Abs: 1.2 10*3/uL (ref 0.7–4.0)
MCH: 29.6 pg (ref 26.0–34.0)
MCHC: 32 g/dL (ref 30.0–36.0)
MCV: 92.7 fL (ref 80.0–100.0)
Monocytes Absolute: 0.5 10*3/uL (ref 0.1–1.0)
Monocytes Relative: 8 %
Neutro Abs: 3.8 10*3/uL (ref 1.7–7.7)
Neutrophils Relative %: 65 %
Platelets: 419 10*3/uL — ABNORMAL HIGH (ref 150–400)
RBC: 2.87 MIL/uL — ABNORMAL LOW (ref 3.87–5.11)
RDW: 14.5 % (ref 11.5–15.5)
WBC: 6 10*3/uL (ref 4.0–10.5)
nRBC: 0 % (ref 0.0–0.2)

## 2021-06-04 LAB — MAGNESIUM: Magnesium: 2 mg/dL (ref 1.7–2.4)

## 2021-06-04 LAB — PHOSPHORUS: Phosphorus: 3.2 mg/dL (ref 2.5–4.6)

## 2021-06-04 LAB — LEGIONELLA PNEUMOPHILA SEROGP 1 UR AG: L. pneumophila Serogp 1 Ur Ag: NEGATIVE

## 2021-06-04 MED ORDER — FUROSEMIDE 10 MG/ML IJ SOLN
60.0000 mg | Freq: Once | INTRAMUSCULAR | Status: AC
  Administered 2021-06-04: 60 mg via INTRAVENOUS
  Filled 2021-06-04: qty 6

## 2021-06-04 NOTE — Progress Notes (Signed)
?PROGRESS NOTE ? ? ? ?Samantha Fitzgerald  CHY:[id number] DOB: 1924-05-14 DOA: 06/01/2021 ?PCP: System, Provider Not In  ? ? ? ?Brief Narrative:  ?Samantha Fitzgerald is a 88 y.o. WF (retired Therapist, sports) PMHx  COPD, chronic respiratory failure with hypoxia on 3 L O2 via [place], malignant neoplasm of bronchus and lung, Pleural Effusion, chronic diastolic CHF, essential HTN, PSVT, pulmonary HTN, Sick Sinus Syndrome, Hx  DVT  ? ?Brought to the ER after patient was complaining of shortness of breath.  Patient denies any chest pain nausea vomiting abdominal pain productive cough fever chills.  Unable to reach patient's daughter at this time. ?  ?ED Course: In the ER patient was hypoxic and tachycardic with heart rate in the 140s.  Chest x-ray was showing features concerning for possible pneumonia versus edema.  COVID test was negative.  CT angiogram of the chest was done which was negative for pulmonary embolism but did show bilateral pleural effusion and groundglass opacities concerning for pneumonia.  Patient admitted for further work-up. ?  ? ? ?Subjective: ?3/21 afebrile overnight Sleepy but arousable.  A/O x4.  Feels somewhat improved. ? ? ?Assessment & Plan: ?Covid vaccination; vaccinated [employer].  Requests Bivalent booster.  Placed order on 3/19 ?  ?Principal Problem: ?  Acute respiratory failure with hypoxia ([place]) ?Active Problems: ?  Pleural effusion on left ?  COPD (chronic obstructive pulmonary disease) ([place]) ?  Sick sinus syndrome ([place]) ?  Chronic diastolic CHF (congestive heart failure) ([place]) ?  Acute on chronic respiratory failure with hypoxemia (HCC) ?  History of DVT (deep vein thrombosis) ?  Paroxysmal SVT (supraventricular tachycardia) (HCC) ?  Acute respiratory failure with hypoxemia (HCC) ?  Pulmonary hypertension ([place]) ?  Bilateral pleural effusion ?  Protein-calorie malnutrition, severe ([place]) ? ?Acute on chronic respiratory failure with hypoxia ?-Incentive spirometry ?- Flutter valve ?- DuoNeb QID  PRN ?- Complete 5 days of antibiotics ? ? ?COPD exacerbation ?-See respiratory failure ? ? ?Bilateral pleural effusion ?-3/19 albumin 50 g + Lasix IV 60 mg BID x2 doses ?-3/21 PCXR pending ? ?Chronic diastolic CHF? ?- Previous echocardiogram 12/08/2020, indeterminate for CHF ?-3/18 BNP mildly elevated= 249.3 ?- 3/19 Echocardiogram pending ?-Strict in and out +810.1 ml ?- Daily weight ?Filed Weights  ? 06/01/21 2106 06/02/21 0802 06/04/21 0400  ?Weight: 54.4 kg 49.5 kg 47 kg  ? ?-3/21 Lasix IV 60 mg; given how frail patient is using PRN ? ?SVT ?-Per admission note 3/19 discussed with on-call cardiologist Dr. Zigmund Daniel.  Since patient blood pressure is on the low side Dr. Zigmund Daniel advise giving adenosine 6 mg IV and if it does not improve then to start amiodarone but to hold off flecainide.  After giving 1 dose of 6 mg IV adenosine patient's heart rate briefly went into sinus for about 2 minutes then went back again into SVT.  Amiodarone was started per pharmacy protocol. ?-Digoxin ?- 3/20 overnight Amiodarone titrated off.  Flecainide 100 mg BID started ? ?Severe protein calorie malnutrition ?-3/19 consult nutrition recommendation ? ?Hyponatremia ?-could be from fluid overload. ? ?Hypokalemia ?- Potassium goal>4 ?-3/20 potassium IV 50 mEq ? ?Hypomagnesmia ?- Magnesium goal> 2 ?- 3/20 magnesium IV 2 g ? ? ?Hx DVT ?- On Eliquis ? ?Chronic anemia ?Lab Results  ?Component Value Date  ? HGB 8.5 (L) 06/04/2021  ? HGB 8.8 (L) 06/03/2021  ? HGB 8.8 (L) 06/02/2021  ? HGB 9.6 (L) 06/01/2021  ? HGB 9.6 (L) 05/09/2021  ? ? ?Stage I sacral  decubitus ulcer ?Pressure Injury 02/27/21 Sacrum Medial Stage 1 -  Intact skin with non-blanchable redness of a localized area usually over a bony prominence. (Active)  ?02/27/21 0200  ?Location: Sacrum  ?Location Orientation: Medial  ?Staging: Stage 1 -  Intact skin with non-blanchable redness of a localized area usually over a bony prominence.  ?Wound Description (Comments):   ?Present on  Admission: Yes  ?   ?Pressure Injury 06/02/21 Sacrum Stage 2 -  Partial thickness loss of dermis presenting as a shallow open injury with a red, pink wound bed without slough. stage 2 with non-blanchable redness (Active)  ?06/02/21 0404  ?Location: Sacrum  ?Location Orientation:   ?Staging: Stage 2 -  Partial thickness loss of dermis presenting as a shallow open injury with a red, pink wound bed without slough.  ?Wound Description (Comments): stage 2 with non-blanchable redness  ?Present on Admission: Yes  ?   ?Pressure Injury 06/02/21 Vertebral column Bilateral Stage 1 -  Intact skin with non-blanchable redness of a localized area usually over a bony prominence. (Active)  ?06/02/21 0404  ?Location: Vertebral column  ?Location Orientation: Bilateral  ?Staging: Stage 1 -  Intact skin with non-blanchable redness of a localized area usually over a bony prominence.  ?Wound Description (Comments):   ?Present on Admission: Yes  ? ? ?Goals of care ?- 3/19 [facility] consult: Patient just moved up here from [facility] and resides in [facility].  Does not have PCP or cardiologist.  Please arrange for PCP ? ?  ? ?Mobility Assessment (last 72 hours)   ? ? Mobility Assessment   ?No documentation. ? ?  ?  ? ?  ? ? ?DVT prophylaxis: Eliquis ?Code Status: DNR ?Family Communication: 3/19 daughter at bedside for discussion of plan of care all questions answered ?Status is: Inpatient ? ? ? ?Dispo: The patient is from: [facility] ?             Anticipated d/c is to: [facility] ?             Anticipated d/c date is: 3 days ?             Patient currently is not medically stable to d/c. ? ? ? ? ? ?Consultants:  ? ? ?Procedures/Significant Events:  ?3/18 CTA  PE protocol:-Negative PE ?-Moderate pleural effusion on the right large pleural effusion on the left with associated compressive atelectasis. ?-Ground-glass opacities in the lungs bilaterally, possible edema or infiltrate. ?-Distended pulmonary trunk may be associated  with underlying pulmonary artery hypertension.  ?-Reflux of contrast into the inferior vena cava and hepatic veins suggesting right heart failure  ?3/20 Echocardiogram ? Left Ventricle: Left ventricular ejection fraction, by estimation, is 60  ?to 65%. ? Left ventricular diastolic parameters  ?are indeterminate.  ? ?Right Ventricle: Right ventricular systolic  ?function is mildly reduced.  ? ? ?I have personally reviewed and interpreted all radiology studies and my findings are as above. ? ?VENTILATOR SETTINGS: ?Nasal Canula 3/21 ?Flow 3L/min ?Spo2 100% ? ? ?Cultures ?3/18 blood LEFT forearm NGTD ?3/18 blood RIGHT forearm NGTD ?3/19 respiratory virus panel negative ?3/19 strep pneumo negative ?3/19 Legionella pending ?3/19 urine negative ?3/19 MRSA by PCR negative ?3/19 HIV screen negative ? ? ?Antimicrobials: ?Anti-infectives (From admission, onward)  ? ? Start     Ordered Stop  ? 06/02/21 2300  cefTRIAXone (ROCEPHIN) 2 g in sodium chloride 0.9 % 100 mL IVPB       ? 06/02/21 0046 06/07/21 2259  ?  06/02/21 2300  azithromycin (ZITHROMAX) 500 mg in sodium chloride 0.9 % 250 mL IVPB       ? 06/02/21 0046 06/07/21 2259  ? 06/01/21 2230  levofloxacin (LEVAQUIN) IVPB 750 mg       ? 06/01/21 2229 06/02/21 0026  ? ?  ?  ? ? ?Devices ?  ? ?LINES / TUBES:  ? ? ? ? ?Continuous Infusions: ? azithromycin Stopped (06/03/21 2345)  ? cefTRIAXone (ROCEPHIN)  IV Stopped (06/03/21 2316)  ? ? ? ?Objective: ?Vitals:  ? 06/04/21 0300 06/04/21 0400 06/04/21 0500 06/04/21 0600  ?BP: (!) 143/44 (!) 153/46 (!) 134/46 (!) 145/35  ?Pulse: 70 65 62 73  ?Resp: 18 (!) 21 18 15   ?Temp: 97.7 ?F (36.5 ?C)     ?TempSrc: Oral     ?SpO2: 100% 100% 100% 100%  ?Weight:  47 kg    ?Height:      ? ? ?Intake/Output Summary (Last 24 hours) at 06/04/2021 0752 ?Last data filed at 06/04/2021 0400 ?Gross per 24 hour  ?Intake 1132.02 ml  ?Output 650 ml  ?Net 482.02 ml  ? ? ?Filed Weights  ? 06/01/21 2106 06/02/21 0802 06/04/21 0400  ?Weight: 54.4 kg 49.5 kg 47 kg   ? ? ?Examination: ? ?General: A/O x4, No acute respiratory distress, hard of hearing ?Eyes: negative scleral hemorrhage, negative anisocoria, negative icterus ?ENT: Negative Runny nose, negative gingival blee

## 2021-06-04 NOTE — TOC Progression Note (Signed)
Transition of Care (TOC) - Progression Note  ? ?Patient Details  ?Name: Xanthe Couillard ?MRN: [id number] ?Date of Birth: 03-28-1924 ? ?Transition of Care (TOC) CM/SW Contact  ?Sherie Don, LCSW ?Phone Number: ?06/04/2021, 1:42 PM ? ?Clinical Narrative: CSW notified the patient's daughter, Nehemiah Massed, was requesting follow up regarding discharge. CSW spoke with daughter and daughter reported the patient's Medicare benefits will soon run out at [facility] and she is looking into other long-term options, including [facility], as the patient has long-term care coverage/insurance. CSW recommended that daughter follow up with both facilities to confirm the patient's discharge location beforehand. Daughter to follow up with [facility] and [facility]. ? ?Expected Discharge Plan: [place] ?Barriers to Discharge: Continued Medical Work up ? ?Expected Discharge Plan and Services ?Expected Discharge Plan: [place] ?Living arrangements for the past 2 months: [place]            ? ?Readmission Risk Interventions ?Readmission Risk Prevention Plan 06/02/2021 05/09/2021 02/27/2021  ?Transportation Screening Complete Complete Complete  ?PCP or Specialist Appt within 3-5 Days Complete Complete Not Complete  ?Not Complete comments - - Patient will discharge back to [facility].  ?[place] or Home Care Consult Complete Complete Not Complete  ?[place] or Home Care Consult comments - - Patient will discharge to SNF.  ?Social Work Consult for [place] Planning/Counseling Complete Complete Complete  ?Palliative Care Screening Complete Complete Not Applicable  ?Medication Review Press photographer) Complete Complete -  ? ? ?

## 2021-06-05 DIAGNOSIS — I5031 Acute diastolic (congestive) heart failure: Secondary | ICD-10-CM

## 2021-06-05 DIAGNOSIS — Z7189 Other specified counseling: Secondary | ICD-10-CM

## 2021-06-05 DIAGNOSIS — J9601 Acute respiratory failure with hypoxia: Secondary | ICD-10-CM | POA: Diagnosis not present

## 2021-06-05 DIAGNOSIS — Z515 Encounter for palliative care: Secondary | ICD-10-CM

## 2021-06-05 DIAGNOSIS — I5032 Chronic diastolic (congestive) heart failure: Secondary | ICD-10-CM | POA: Diagnosis not present

## 2021-06-05 LAB — CBC WITH DIFFERENTIAL/PLATELET
Abs Immature Granulocytes: 0.03 10*3/uL (ref 0.00–0.07)
Basophils Absolute: 0 10*3/uL (ref 0.0–0.1)
Basophils Relative: 1 %
Eosinophils Absolute: 0.5 10*3/uL (ref 0.0–0.5)
Eosinophils Relative: 6 %
HCT: 28.7 % — ABNORMAL LOW (ref 36.0–46.0)
Hemoglobin: 9 g/dL — ABNORMAL LOW (ref 12.0–15.0)
Immature Granulocytes: 0 %
Lymphocytes Relative: 18 %
Lymphs Abs: 1.4 10*3/uL (ref 0.7–4.0)
MCH: 29.7 pg (ref 26.0–34.0)
MCHC: 31.4 g/dL (ref 30.0–36.0)
MCV: 94.7 fL (ref 80.0–100.0)
Monocytes Absolute: 0.6 10*3/uL (ref 0.1–1.0)
Monocytes Relative: 7 %
Neutro Abs: 5.4 10*3/uL (ref 1.7–7.7)
Neutrophils Relative %: 68 %
Platelets: 423 10*3/uL — ABNORMAL HIGH (ref 150–400)
RBC: 3.03 MIL/uL — ABNORMAL LOW (ref 3.87–5.11)
RDW: 14.5 % (ref 11.5–15.5)
WBC: 7.9 10*3/uL (ref 4.0–10.5)
nRBC: 0 % (ref 0.0–0.2)

## 2021-06-05 LAB — COMPREHENSIVE METABOLIC PANEL
ALT: 9 U/L (ref 0–44)
AST: 11 U/L — ABNORMAL LOW (ref 15–41)
Albumin: 3 g/dL — ABNORMAL LOW (ref 3.5–5.0)
Alkaline Phosphatase: 66 U/L (ref 38–126)
Anion gap: 8 (ref 5–15)
BUN: 15 mg/dL (ref 8–23)
CO2: 35 mmol/L — ABNORMAL HIGH (ref 22–32)
Calcium: 8.3 mg/dL — ABNORMAL LOW (ref 8.9–10.3)
Chloride: 93 mmol/L — ABNORMAL LOW (ref 98–111)
Creatinine, Ser: 0.51 mg/dL (ref 0.44–1.00)
GFR, Estimated: >60 mL/min (ref >60)
Glucose, Bld: 93 mg/dL (ref 70–99)
Potassium: 3.7 mmol/L (ref 3.5–5.1)
Sodium: 136 mmol/L (ref 135–145)
Total Bilirubin: 0.1 mg/dL — ABNORMAL LOW (ref 0.3–1.2)
Total Protein: 5.6 g/dL — ABNORMAL LOW (ref 6.5–8.1)

## 2021-06-05 LAB — MAGNESIUM: Magnesium: 2 mg/dL (ref 1.7–2.4)

## 2021-06-05 LAB — PHOSPHORUS: Phosphorus: 3 mg/dL (ref 2.5–4.6)

## 2021-06-05 LAB — BRAIN NATRIURETIC PEPTIDE: B Natriuretic Peptide: 79.8 pg/mL (ref 0.0–100.0)

## 2021-06-05 MED ORDER — FUROSEMIDE 10 MG/ML IJ SOLN
40.0000 mg | Freq: Two times a day (BID) | INTRAMUSCULAR | Status: DC
  Administered 2021-06-05 (×2): 40 mg via INTRAVENOUS
  Filled 2021-06-05 (×2): qty 4

## 2021-06-05 MED ORDER — AMIODARONE HCL 200 MG PO TABS
200.0000 mg | ORAL_TABLET | Freq: Every day | ORAL | Status: DC
Start: 2021-06-05 — End: 2021-06-06
  Administered 2021-06-05 – 2021-06-06 (×2): 200 mg via ORAL
  Filled 2021-06-05 (×2): qty 1

## 2021-06-05 MED ORDER — POTASSIUM CHLORIDE 20 MEQ PO PACK
40.0000 meq | PACK | Freq: Once | ORAL | Status: AC
  Administered 2021-06-05: 40 meq via ORAL
  Filled 2021-06-05: qty 2

## 2021-06-05 MED ORDER — AZITHROMYCIN 250 MG PO TABS
500.0000 mg | ORAL_TABLET | Freq: Every day | ORAL | Status: AC
  Administered 2021-06-05 – 2021-06-06 (×2): 500 mg via ORAL
  Filled 2021-06-05 (×2): qty 2

## 2021-06-05 MED ORDER — ENSURE ENLIVE PO LIQD
237.0000 mL | Freq: Two times a day (BID) | ORAL | Status: DC
  Administered 2021-06-05 – 2021-06-11 (×7): 237 mL via ORAL

## 2021-06-05 NOTE — Consult Note (Addendum)
?Cardiology Consultation:  ? ?Patient ID: Samantha Fitzgerald ?MRN: [id number]; DOB: [birth year] ? ?Admit date: 06/01/2021 ?Date of Consult: 06/05/2021 ? ?PCP:  System, Provider Not In ?  ?[facility] Providers ?Cardiologist:  new to [facility] - previously seen by [facility]   ? ? ?Patient Profile:  ? ?Samantha Fitzgerald is a 88 y.o. female with a hx of COPD, chronic respiratory failure on 24/7 O2, former tobacco use, paroxysmal SVT, PAF, history of tachybradycardia syndrome, hypertension with significant orthostatic hypotension on midodrine, hypothyroidism, DVT and history of lung cancer who is being seen 06/05/2021 for the evaluation of acute diastolic heart failure in the setting of tachypalpitations at the request of Dr. Tyrell Antonio. ? ?History of Present Illness:  ? ?Ms. Loose is a 88 year old female with past medical history of COPD, chronic respiratory failure on 24/7 O2, former tobacco use, paroxysmal SVT, PAF, history of tachybradycardia syndrome, hypertension with significant orthostatic hypotension on midodrine, hypothyroidism, DVT and history of lung cancer.  Patient was previously followed by [facility] cardiology.  She was not placed on amiodarone therapy given her history of COPD.  Over the years, she required several ED visit due to tachycardia palpitation.  She has been maintained on a course of metoprolol, digoxin and flecainide.  She is also on Eliquis as well.  She was last seen by [facility] cardiology service via virtual visit in February 2022.  She has since been moved to [facility] area to be closer to her daughter and currently resides at [facility] where her daughter visits her on a daily basis.  Last echocardiogram obtained on 12/08/2020 showed EF 60 to 65%, patient was apparently in rapid a flutter at the time, no significant valve issue.  She required twice a day of measures in due to significant orthostatic hypotension.  She takes 75 mg twice a day of flecainide.  She was admitted in  December 2022 with pneumonia versus COPD exacerbation versus decompensated diastolic heart failure exacerbation.  She was treated with left-sided thoracentesis, antibiotic and a steroid.  She also required IV Lasix as well.  She returned in February 2023 with another episode of hypoxia.  She was again treated with steroid and antibiotic therapy.  Patient returned to this time on 06/01/2021 with worsening dyspnea.  On arrival, patient was tachycardic with heart rate in the 140s.  Admission doctor spoke with overnight cardiology fellow who recommended giving the patient a dose of adenosine, and if this does not help then start the patient on IV amiodarone.  After giving a single dose of IV adenosine, his heart rate reportedly went into sinus rhythm however after 2 minutes went back into SVT.  CTA of the chest obtained on 06/01/2021 showed no PE, moderate pleural effusion on the right, large pleural effusion on the left side, groundglass opacities in the lungs bilaterally, both aortic and coronary artery calcification.  Viral panel was negative.  Echocardiogram obtained 06/03/2021 showed EF 60 to 85%, mild RV systolic dysfunction, no significant valve issue.  IV amiodarone stopped on 3/20 and switched back to flecainide at higher dose of 100 mg twice a day.  Cardiology service consulted for possible heart failure and tachycardia palpitation. ? ? ?Past Medical History:  ?Diagnosis Date  ? Anemia   ? CHF (congestive heart failure) ([place])   ? 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 COPD (chronic obstructive pulmonary disease) ([place])   ? Dysphagia   ? GERD without esophagitis   ? Hyperlipidemia   ?  Hypokalemia   ? Hypomagnesemia   ? Malignant neoplasm of bronchus and lung ([place])   ? Osteoporosis   ? Pleural effusion   ? Pneumonia   ? Pulmonary hypertension ([place])   ? Respiratory failure ([place])   ? Retention of urine   ? Sick sinus syndrome ([place])   ? Unspecified protein-calorie malnutrition ([place])   ?  Ventricular premature depolarization   ? ? ?History reviewed. No pertinent surgical history.  ? ?Home Medications:  ?Prior to Admission medications   ?Medication Sig Start Date End Date Taking? Authorizing Provider  ?albuterol (VENTOLIN HFA) 108 (90 Base) MCG/ACT inhaler Inhale 2 puffs into the lungs in the morning and at bedtime.   Yes [provider]  ?albuterol (VENTOLIN HFA) 108 (90 Base) MCG/ACT inhaler Inhale 2 puffs into the lungs every 4 (four) hours as needed for wheezing.   Yes [provider]  ?apixaban (ELIQUIS) 5 MG TABS tablet Take 1 tablet (5 mg total) by mouth 2 (two) times daily. ?Patient taking differently: Take 5 mg by mouth every 12 (twelve) hours. 12/14/20  Yes Gherghe, Vella Redhead, MD  ?digoxin (LANOXIN) 0.125 MG tablet Take 0.125 mg by mouth daily. Hold and notify MD if HR less than 60 04/25/20  Yes [provider]  ?Ensure (ENSURE) Take 237 mLs by mouth 3 (three) times daily between meals.   Yes [provider]  ?flecainide (TAMBOCOR) 50 MG tablet Take 75 mg by mouth 2 (two) times daily. for Sick Sinus Syndrome 07/27/20  Yes [provider]  ?furosemide (LASIX) 20 MG tablet Take 20 mg by mouth daily. 08/05/20  Yes [provider]  ?loperamide (IMODIUM A-D) 2 MG tablet Take 2 mg by mouth as needed for diarrhea or loose stools. After each subsequent loose BM, give 2 tabs   Yes [provider]  ?loperamide (IMODIUM A-D) 2 MG tablet Take 2-4 mg by mouth as needed (give 2 tabs after 1st loose BM; then 1 tab after each subsequent loose BM.).   Yes [provider]  ?midodrine (PROAMATINE) 10 MG tablet Take 10 mg by mouth in the morning and at bedtime.   Yes [provider]  ?sertraline (ZOLOFT) 25 MG tablet Take 25 mg by mouth daily. 02/03/21  Yes [provider]  ?tamsulosin (FLOMAX) 0.4 MG CAPS capsule Take 1 capsule (0.4 mg total) by mouth daily after breakfast. 12/14/20  Yes Gherghe, Vella Redhead, MD  ?Viviana Simpler ELLIPTA  100-62.5-25 MCG/INH AEPB Take 1 puff by mouth daily. 08/06/20  Yes [provider]  ?OXYGEN Inhale 2 L into the lungs continuous.    [provider]  ? ? ?Inpatient Medications: ?Scheduled Meds: ? apixaban  5 mg Oral Q12H  ? Chlorhexidine Gluconate Cloth  6 each Topical Q0600  ? COVID-19 mRNA bivalent vaccine (Pfizer)  0.3 mL Intramuscular Once  ? digoxin  0.125 mg Oral Daily  ? feeding supplement  237 mL Oral BID BM  ? flecainide  100 mg Oral Q12H  ? fluticasone furoate-vilanterol  1 puff Inhalation Daily  ? And  ? umeclidinium bromide  1 puff Inhalation Daily  ? furosemide  40 mg Intravenous Q12H  ? mouth rinse  15 mL Mouth Rinse BID  ? sertraline  25 mg Oral Daily  ? ?Continuous Infusions: ? azithromycin Stopped (06/04/21 2301)  ? cefTRIAXone (ROCEPHIN)  IV Stopped (06/04/21 2232)  ? ?PRN Meds: ?acetaminophen **OR** acetaminophen, ipratropium-albuterol, LORazepam ? ?Allergies:    ?Allergies  ?Allergen Reactions  ? Amoxicillin Nausea Only and  Other (See Comments)  ?  Listed on MAR  ? Doxycycline Nausea Only and Other (See Comments)  ?  Listed on MAR  ? Levofloxacin Nausea Only and Other (See Comments)  ?  Not documented on the [facility]  ? Morphine And Related Nausea Only and Other (See Comments)  ?  Listed on MAR  ? ? ?Social History:   ?Social History  ? ?Socioeconomic History  ? Marital status: Widowed  ?  Spouse name: Not on file  ? Number of children: Not on file  ? Years of education: Not on file  ? Highest education level: Not on file  ?Occupational History  ? Not on file  ?Tobacco Use  ? Smoking status: Former  ?  Packs/day: 1.00  ?  Years: 25.00  ?  Pack years: 25.00  ?  Types: Cigarettes  ?  Quit date: 10/01/1963  ?  Years since quitting: 57.7  ?  Passive exposure: Never  ? Smokeless tobacco: Never  ?Vaping Use  ? Vaping Use: Never used  ?Substance and Sexual Activity  ? Alcohol use: Never  ? Drug use: Never  ? Sexual activity: Not on file  ?Other Topics Concern  ? Not on file  ?Social History  Narrative  ? Not on file  ? ?Social Determinants of Health  ? ?Financial Resource Strain: Not on file  ?Food Insecurity: Not on file  ?Transportation Needs: Not on file  ?Physical Activity: Not on file  ?Stres

## 2021-06-05 NOTE — Progress Notes (Signed)
PROGRESS NOTE    Samantha Fitzgerald  AVW:[id number] DOB: [birth year] DOA: 06/01/2021 PCP: System, Provider Not In   Brief Narrative: 88 year old with past medical history significant for COPD chronic respiratory failure with hypoxia on 3 L of oxygen at home, malignant neoplasm of the bronchus and lung, pleural effusion, chronic diastolic heart failure, essential hypertension, PSVT, pulmonary hypertension, sick sinus syndrome, history of DVT who was brought to the ER.  She was complaining of worsening shortness of breath.  Evaluation in the ED patient was found to be in SVT with heart rate in the 140.  CT angio of the chest was negative for PE did show bilateral pleural effusion and groundglass opacity concerning for pneumonia.  Her SVT she received a dose of adenosine, which did not resolve the SVT.  Subsequently she was a started on amiodarone drip.  Subsequently transition to flecainide.   Cardiology consulted to assist with management of heart failure exacerbation and arrhythmia   Assessment & Plan:   Principal Problem:   Acute respiratory failure with hypoxia (HCC) Active Problems:   Pleural effusion on left   COPD (chronic obstructive pulmonary disease) (HCC)   Sick sinus syndrome (HCC)   Chronic diastolic CHF (congestive heart failure) (HCC)   Acute on chronic respiratory failure with hypoxemia (HCC)   History of DVT (deep vein thrombosis)   Paroxysmal SVT (supraventricular tachycardia) (HCC)   Acute respiratory failure with hypoxemia (HCC)   Pulmonary hypertension (HCC)   Bilateral pleural effusion   Protein-calorie malnutrition, severe (HCC)   Decubitus ulcer of sacral region, stage 1   1-Acute on chronic hypoxic respiratory failure; secondary to bilateral pleural effusion and heart failure exacerbation -Patient also completed 5 days of antibiotics -Continue with DuoNeb -Continue with IV Lasix,  2-COPD exacerbation: Continue with DuoNeb  3-Bilateral pleural effusion:  Receive IV Lasix X-ray 3/21 persistent bilateral pleural effusion Continue with IV Lasix and cardiology consultation  4-Acute chronic diastolic heart failure: Presented with shortness of breath, bilateral pleural effusion elevated BNP Continue with IV Lasix 40 mg IV twice daily Cardiology consulted   SVT: Per admission note 3/19 on-call cardiology  Dr. Ashley Royalty recommended IV adenosine, if not improvement start amiodarone.  Patient was started on IV amiodarone, subsequently transition to Flecainide.  Cardiology consulted to assist with arrhythmia. Dr Lucita Lora recommend stop flecainide and start amiodarone. Also recommended palliative care consultation.    Pressure Injury 02/27/21 Sacrum Medial Stage 1 -  Intact skin with non-blanchable redness of a localized area usually over a bony prominence. (Active)  02/27/21 0200  Location: Sacrum  Location Orientation: Medial  Staging: Stage 1 -  Intact skin with non-blanchable redness of a localized area usually over a bony prominence.  Wound Description (Comments):   Present on Admission: Yes     Pressure Injury 06/02/21 Sacrum Stage 2 -  Partial thickness loss of dermis presenting as a shallow open injury with a red, pink wound bed without slough. stage 2 with non-blanchable redness (Active)  06/02/21 0404  Location: Sacrum  Location Orientation:   Staging: Stage 2 -  Partial thickness loss of dermis presenting as a shallow open injury with a red, pink wound bed without slough.  Wound Description (Comments): stage 2 with non-blanchable redness  Present on Admission: Yes     Pressure Injury 06/02/21 Vertebral column Bilateral Stage 1 -  Intact skin with non-blanchable redness of a localized area usually over a bony prominence. (Active)  06/02/21 0404  Location: Vertebral column  Location Orientation: Bilateral  Staging: Stage 1 -  Intact skin with non-blanchable redness of a localized area usually over a bony prominence.  Wound Description  (Comments):   Present on Admission: Yes     Nutrition Problem: Severe Malnutrition Etiology: chronic illness    Signs/Symptoms: severe fat depletion, severe muscle depletion    Interventions: Ensure Enlive (each supplement provides 350kcal and 20 grams of protein), Liberalize Diet  Estimated body mass index is 16.72 kg/m as calculated from the following:   Height as of this encounter: 5\' 6"  (1.676 m).   Weight as of this encounter: 47 kg.   DVT prophylaxis: Eliquis Code Status: DNR Family Communication: Disposition Plan:  Status is: Inpatient Remains inpatient appropriate because: remain in patient for IV lasix.     Consultants:  Cardiology   Procedures:  None  Antimicrobials:    Subjective: She report feeling better, breathing.   Objective: Vitals:   06/05/21 0000 06/05/21 0100 06/05/21 0200 06/05/21 0300  BP: (!) 137/47 (!) 125/45 (!) 143/46   Pulse: 69 66 69   Resp: 20 17 18    Temp:    (!) 97.2 F (36.2 C)  TempSrc:    Axillary  SpO2: 100% 100% 100%   Weight:      Height:        Intake/Output Summary (Last 24 hours) at 06/05/2021 0742 Last data filed at 06/05/2021 0000 Gross per 24 hour  Intake 1067.83 ml  Output 700 ml  Net 367.83 ml   Filed Weights   06/01/21 2106 06/02/21 0802 06/04/21 0400  Weight: 54.4 kg 49.5 kg 47 kg    Examination:  General exam: Appears calm and comfortable  Respiratory system: BL crackles.  Cardiovascular system: S1 & S2 heard, RRR, positive JVD Gastrointestinal system: Abdomen is nondistended, soft and nontender. No organomegaly or masses felt. Normal bowel sounds heard. Central nervous system: Alert and oriented. No focal neurological deficits. Extremities: Symmetric 5 x 5 power.   Data Reviewed: I have personally reviewed following labs and imaging studies  CBC: Recent Labs  Lab 06/01/21 2146 06/02/21 0511 06/03/21 0302 06/04/21 0225 06/05/21 0252  WBC 8.9 6.1 6.5 6.0 7.9  NEUTROABS 6.8  --  4.4  3.8 5.4  HGB 9.6* 8.8* 8.8* 8.5* 9.0*  HCT 30.0* 27.4* 27.5* 26.6* 28.7*  MCV 92.3 92.6 91.1 92.7 94.7  PLT 391 460* 438* 419* 423*   Basic Metabolic Panel: Recent Labs  Lab 06/01/21 2146 06/02/21 0511 06/03/21 0302 06/04/21 0225 06/05/21 0252  NA 129* 130* 134* 134* 136  K 4.9 4.2 3.2* 3.6 3.7  CL 92* 90* 93* 95* 93*  CO2 29 33* 34* 34* 35*  GLUCOSE 117* 106* 85 85 93  BUN 11 11 9 11 15   CREATININE 0.69 0.66 0.54 0.45 0.51  CALCIUM 7.8* 8.0* 8.3* 8.0* 8.3*  MG  --   --  1.7 2.0 2.0  PHOS  --   --  3.4 3.2 3.0   GFR: Estimated Creatinine Clearance: 31.9 mL/min (by C-G formula based on SCr of 0.51 mg/dL). Liver Function Tests: Recent Labs  Lab 06/01/21 2146 06/02/21 0511 06/03/21 0302 06/04/21 0225 06/05/21 0252  AST 21 12* 12* 12* 11*  ALT 8 9 10 8 9   ALKPHOS 82 71 67 60 66  BILITOT 0.9 0.2* 0.1* <0.1* 0.1*  PROT 5.5* 5.2* 5.8* 5.1* 5.6*  ALBUMIN 2.5* 2.5* 3.2* 2.7* 3.0*   No results for input(s): LIPASE, AMYLASE in the last 168 hours. No results for input(s): AMMONIA in the last 168  hours. Coagulation Profile: No results for input(s): INR, PROTIME in the last 168 hours. Cardiac Enzymes: No results for input(s): CKTOTAL, CKMB, CKMBINDEX, TROPONINI in the last 168 hours. BNP (last 3 results) No results for input(s): PROBNP in the last 8760 hours. HbA1C: No results for input(s): HGBA1C in the last 72 hours. CBG: No results for input(s): GLUCAP in the last 168 hours. Lipid Profile: No results for input(s): CHOL, HDL, LDLCALC, TRIG, CHOLHDL, LDLDIRECT in the last 72 hours. Thyroid Function Tests: No results for input(s): TSH, T4TOTAL, FREET4, T3FREE, THYROIDAB in the last 72 hours. Anemia Panel: No results for input(s): VITAMINB12, FOLATE, FERRITIN, TIBC, IRON, RETICCTPCT in the last 72 hours. Sepsis Labs: Recent Labs  Lab 06/01/21 2148  LATICACIDVEN 1.9    Recent Results (from the past 240 hour(s))  Culture, blood (Routine X 2) w Reflex to ID Panel      Status: None (Preliminary result)   Collection Time: 06/01/21  9:07 PM   Specimen: BLOOD  Result Value Ref Range Status   Specimen Description   Final    BLOOD BLOOD LEFT FOREARM Performed at [facility], [address], [place], [place] [address]    Special Requests   Final    BOTTLES DRAWN AEROBIC AND ANAEROBIC Blood Culture adequate volume Performed at [facility], [address] [place], [place] [address]    Culture   Final    NO GROWTH 3 DAYS Performed at [facility], [address], [place], [place] [address]    Report Status PENDING  Incomplete  Culture, blood (Routine X 2) w Reflex to ID Panel     Status: None (Preliminary result)   Collection Time: 06/01/21  9:12 PM   Specimen: BLOOD  Result Value Ref Range Status   Specimen Description   Final    BLOOD BLOOD RIGHT FOREARM Performed at [facility], [address], [place], [place] [address]    Special Requests   Final    BOTTLES DRAWN AEROBIC AND ANAEROBIC Blood Culture adequate volume Performed at [facility], [address], [place], [place] [address]    Culture   Final    NO GROWTH 3 DAYS Performed at [facility], [address], [place], [place] [address]    Report Status PENDING  Incomplete  Resp Panel by RT-PCR (Flu A&B, Covid) Peripheral     Status: None   Collection Time: 06/01/21 10:32 PM   Specimen: Peripheral; Nasopharyngeal(NP) swabs in vial transport medium  Result Value Ref Range Status   SARS Coronavirus 2 by RT PCR NEGATIVE NEGATIVE Final    Comment: (NOTE) SARS-CoV-2 target nucleic acids are NOT DETECTED.  The SARS-CoV-2 RNA is generally detectable in upper respiratory specimens during the acute phase of infection. The lowest concentration of SARS-CoV-2 viral copies this assay can detect is 138 copies/mL. A negative result does not preclude SARS-Cov-2 infection and should not be used as the sole basis  for treatment or other patient management decisions. A negative result may occur with  improper specimen collection/handling, submission of specimen other than nasopharyngeal swab, presence of viral mutation(s) within the areas targeted by this assay, and inadequate number of viral copies(<138 copies/mL). A negative result must be combined with clinical observations, patient history, and epidemiological information. The expected result is Negative.  Fact Sheet for Patients:  BloggerCourse.com  Fact Sheet for Healthcare Providers:  SeriousBroker.it  This test is no t yet approved or cleared by the  [place] [employer] and  has been authorized for detection and/or diagnosis of SARS-CoV-2 by FDA under an TEFL teacher (EUA). This EUA will remain  in effect (meaning this test can be used) for the duration of the COVID-19 declaration under Section 564(b)(1) of the Act, 21 U.S.C.section 360bbb-3(b)(1), unless the authorization is terminated  or revoked sooner.       Influenza A by PCR NEGATIVE NEGATIVE Final   Influenza B by PCR NEGATIVE NEGATIVE Final    Comment: (NOTE) The Xpert Xpress SARS-CoV-2/FLU/RSV plus assay is intended as an aid in the diagnosis of influenza from Nasopharyngeal swab specimens and should not be used as a sole basis for treatment. Nasal washings and aspirates are unacceptable for Xpert Xpress SARS-CoV-2/FLU/RSV testing.  Fact Sheet for Patients: BloggerCourse.com  Fact Sheet for Healthcare Providers: SeriousBroker.it  This test is not yet approved or cleared by the [place] FDA and has been authorized for detection and/or diagnosis of SARS-CoV-2 by FDA under an Emergency Use Authorization (EUA). This EUA will remain in effect (meaning this test can be used) for the duration of the COVID-19 declaration under Section 564(b)(1) of the Act, 21  U.S.C. section 360bbb-3(b)(1), unless the authorization is terminated or revoked.  Performed at [facility], [address] [place], [place] [address]   MRSA Next Gen by PCR, Nasal     Status: None   Collection Time: 06/02/21  4:18 AM   Specimen: Nasal Mucosa; Nasal Swab  Result Value Ref Range Status   MRSA by PCR Next Gen NOT DETECTED NOT DETECTED Final    Comment: (NOTE) The GeneXpert MRSA Assay (FDA approved for NASAL specimens only), is one component of a comprehensive MRSA colonization surveillance program. It is not intended to diagnose MRSA infection nor to guide or monitor treatment for MRSA infections. Test performance is not FDA approved in patients less than 81 years old. Performed at [facility], [address] [place], [place] [address]   Urine Culture     Status: None   Collection Time: 06/02/21 10:33 AM   Specimen: Urine, Clean Catch  Result Value Ref Range Status   Specimen Description   Final    URINE, CLEAN CATCH Performed at [facility], [address] [place], [place] [address]    Special Requests   Final    NONE Performed at [facility], [address], [place], [place] [address]    Culture   Final    NO GROWTH Performed at [facility], [address] [place], [place] [address]    Report Status 06/03/2021 FINAL  Final  Respiratory (~20 pathogens) panel by PCR     Status: None   Collection Time: 06/02/21 11:14 AM   Specimen: Nasopharyngeal Swab; Respiratory  Result Value Ref Range Status   Adenovirus NOT DETECTED NOT DETECTED Final   Coronavirus 229E NOT DETECTED NOT DETECTED Final    Comment: (NOTE) The Coronavirus on the Respiratory Panel, DOES NOT test for the novel  Coronavirus (2019 nCoV)    Coronavirus HKU1 NOT DETECTED NOT DETECTED Final   Coronavirus NL63 NOT DETECTED NOT DETECTED Final   Coronavirus OC43 NOT DETECTED NOT DETECTED Final    Metapneumovirus NOT DETECTED NOT DETECTED Final   Rhinovirus / Enterovirus NOT DETECTED NOT DETECTED Final   Influenza A NOT DETECTED NOT DETECTED Final   Influenza B NOT DETECTED NOT DETECTED Final   Parainfluenza Virus 1 NOT DETECTED NOT DETECTED Final  Parainfluenza Virus 2 NOT DETECTED NOT DETECTED Final   Parainfluenza Virus 3 NOT DETECTED NOT DETECTED Final   Parainfluenza Virus 4 NOT DETECTED NOT DETECTED Final   Respiratory Syncytial Virus NOT DETECTED NOT DETECTED Final   Bordetella pertussis NOT DETECTED NOT DETECTED Final   Bordetella Parapertussis NOT DETECTED NOT DETECTED Final   Chlamydophila pneumoniae NOT DETECTED NOT DETECTED Final   Mycoplasma pneumoniae NOT DETECTED NOT DETECTED Final    Comment: Performed at [facility], [address] [place], [place] [address]         Radiology Studies: DG CHEST PORT 1 VIEW  Result Date: 06/04/2021 CLINICAL DATA:  COPD.  Hypoxia.  History of lung cancer EXAM: PORTABLE CHEST 1 VIEW COMPARISON:  06/01/2021 FINDINGS: Small right pleural effusion and right lower lobe airspace disease unchanged. Surgical clips overlying the right hilum unchanged Mild to moderate left effusion and left lower lobe airspace disease also unchanged. No significant pulmonary edema. IMPRESSION: Bilateral pleural effusions and bibasilar airspace disease left greater than right unchanged from the prior study. Negative for edema. Electronically Signed   By: Marlan Palau M.D.   On: 06/04/2021 12:52   ECHOCARDIOGRAM COMPLETE  Result Date: 06/03/2021    ECHOCARDIOGRAM REPORT   Patient Name:   Samantha Fitzgerald [place] Date of Exam: 06/03/2021 Medical Rec #:  [id number]              Height:       66.0 in Accession #:    [id number]             Weight:       109.1 lb Date of Birth:  03-25-1924               BSA:          1.546 m Patient Age:    89 years               BP:           141/52 mmHg Patient Gender: F                      HR:           69 bpm. Exam Location:   Inpatient Procedure: 2D Echo, Cardiac Doppler, Color Doppler and Intracardiac            Opacification Agent Indications:    I50.21 CHF  History:        Patient has prior history of Echocardiogram examinations, most                 recent 12/08/2020. CHF, COPD; Risk Factors:Dyslipidemia. PLEURAL                 EFF. Ramond Marrow. FAILURE/ PULM. HTN.  Sonographer:    Festus Barren Referring Phys: [id number] CURTIS J WOODS  Sonographer Comments: Suboptimal parasternal window, suboptimal apical window and suboptimal subcostal window. Image acquisition challenging due to COPD and Image acquisition challenging due to respiratory motion. IMPRESSIONS  1. Left ventricular ejection fraction, by estimation, is 60 to 65%. The left ventricle has normal function. The left ventricle has no regional wall motion abnormalities. Left ventricular diastolic parameters are indeterminate.  2. Right ventricular systolic function is mildly reduced. The right ventricular size is mildly enlarged.  3. The mitral valve is normal in structure. No evidence of mitral valve regurgitation. No evidence of mitral stenosis.  4. The aortic valve is normal in structure. Aortic valve regurgitation is not visualized.  No aortic stenosis is present.  5. The inferior vena cava is normal in size with greater than 50% respiratory variability, suggesting right atrial pressure of 3 mmHg. FINDINGS  Left Ventricle: Left ventricular ejection fraction, by estimation, is 60 to 65%. The left ventricle has normal function. The left ventricle has no regional wall motion abnormalities. The left ventricular internal cavity size was normal in size. There is  no left ventricular hypertrophy. Left ventricular diastolic parameters are indeterminate. Right Ventricle: The right ventricular size is mildly enlarged. No increase in right ventricular wall thickness. Right ventricular systolic function is mildly reduced. Left Atrium: Left atrial size was normal in size. Right Atrium: Right  atrial size was normal in size. Pericardium: There is no evidence of pericardial effusion. Mitral Valve: The mitral valve is normal in structure. No evidence of mitral valve regurgitation. No evidence of mitral valve stenosis. Tricuspid Valve: The tricuspid valve is normal in structure. Tricuspid valve regurgitation is not demonstrated. No evidence of tricuspid stenosis. Aortic Valve: The aortic valve is normal in structure. Aortic valve regurgitation is not visualized. No aortic stenosis is present. Aortic valve mean gradient measures 1.0 mmHg. Aortic valve peak gradient measures 2.3 mmHg. Aortic valve area, by VTI measures 2.86 cm. Pulmonic Valve: The pulmonic valve was normal in structure. Pulmonic valve regurgitation is not visualized. No evidence of pulmonic stenosis. Aorta: The aortic root is normal in size and structure. Venous: The inferior vena cava is normal in size with greater than 50% respiratory variability, suggesting right atrial pressure of 3 mmHg. IAS/Shunts: No atrial level shunt detected by color flow Doppler.  LEFT VENTRICLE PLAX 2D LVIDd:         3.50 cm LVIDs:         1.90 cm LV PW:         1.00 cm LV IVS:        1.00 cm LVOT diam:     1.70 cm LV SV:         57 LV SV Index:   37 LVOT Area:     2.27 cm  LV Volumes (MOD) LV vol d, MOD A4C: 31.1 ml LV vol s, MOD A4C: 13.4 ml LV SV MOD A4C:     31.1 ml IVC IVC diam: 1.70 cm LEFT ATRIUM             Index LA diam:        2.90 cm 1.88 cm/m LA Vol (A2C):   17.9 ml 11.58 ml/m LA Vol (A4C):   31.4 ml 20.31 ml/m LA Biplane Vol: 24.6 ml 15.91 ml/m  AORTIC VALVE                    PULMONIC VALVE AV Area (Vmax):    2.24 cm     PV Vmax:       0.47 m/s AV Area (Vmean):   1.96 cm     PV Vmean:      36.600 cm/s AV Area (VTI):     2.86 cm     PV VTI:        0.124 m AV Vmax:           76.00 cm/s   PV Peak grad:  0.9 mmHg AV Vmean:          56.900 cm/s  PV Mean grad:  1.0 mmHg AV VTI:            0.201 m AV Peak Grad:      2.3  mmHg AV Mean Grad:      1.0  mmHg LVOT Vmax:         75.00 cm/s LVOT Vmean:        49.100 cm/s LVOT VTI:          0.253 m LVOT/AV VTI ratio: 1.26  AORTA Ao Root diam: 2.90 cm MITRAL VALVE MV Area (PHT): 3.03 cm     SHUNTS MV Decel Time: 250 msec     Systemic VTI:  0.25 m MV E velocity: 73.30 cm/s   Systemic Diam: 1.70 cm MV A velocity: 108.00 cm/s MV E/A ratio:  0.68 Charlton Haws MD Electronically signed by Charlton Haws MD Signature Date/Time: 06/03/2021/8:54:57 AM    Final         Scheduled Meds:  apixaban  5 mg Oral Q12H   Chlorhexidine Gluconate Cloth  6 each Topical Q0600   COVID-19 mRNA bivalent vaccine (Pfizer)  0.3 mL Intramuscular Once   digoxin  0.125 mg Oral Daily   feeding supplement  237 mL Oral TID BM   flecainide  100 mg Oral Q12H   fluticasone furoate-vilanterol  1 puff Inhalation Daily   And   umeclidinium bromide  1 puff Inhalation Daily   mouth rinse  15 mL Mouth Rinse BID   sertraline  25 mg Oral Daily   Continuous Infusions:  azithromycin Stopped (06/04/21 2301)   cefTRIAXone (ROCEPHIN)  IV Stopped (06/04/21 2232)     LOS: 4 days    Time spent: 45 minutes.     Alba Cory, MD [facility]   If 7PM-7AM, please contact night-coverage [email or web address]  06/05/2021, 7:42 AM

## 2021-06-05 NOTE — Progress Notes (Signed)
PHARMACIST - PHYSICIAN COMMUNICATION  CONCERNING: Antibiotic IV to Oral Route Change Policy  RECOMMENDATION: This patient is receiving azithromycin by the intravenous route.  Based on criteria approved by the Pharmacy and Therapeutics Committee, the antibiotic(s) is/are being converted to the equivalent oral dose form(s).   DESCRIPTION: These criteria include:  Patient being treated for a respiratory tract infection, urinary tract infection, cellulitis or clostridium difficile associated diarrhea if on metronidazole  The patient is not neutropenic and does not exhibit a GI malabsorption state  The patient is eating (either orally or via tube) and/or has been taking other orally administered medications for a least 24 hours  The patient is improving clinically and has a Tmax < 100.5  If you have questions about this conversion, please contact the Pharmacy Department  []  ( [phone] )  [place] []  ( [phone]  [facility] []  ( [phone]  [place] []  ( [phone]  [facility] [x]  ( [phone] )  [facility]  

## 2021-06-05 NOTE — Evaluation (Signed)
Occupational Therapy Evaluation ?Patient Details ?Name: Samantha Fitzgerald ?MRN: [id number] ?DOB: 07-03-1924 ?Today's Date: 06/05/2021 ? ? ?History of Present Illness patient presented to the hospital with shortness of breath. patient was found to haveacute respiratory failure with hypoxia and COPD exacerbation HFW:YOVZ, chronic respiratory failure with hypoxia on 3 L O2 via [place], malignant neoplasm of bronchus and lung, Pleural Effusion, chronic diastolic CHF, essential HTN, PSVT, pulmonary HTN, Sick Sinus Syndrome, Hx  DVT  ? ?Clinical Impression ?  ?Patient is a 88 year old female who was admitted for above. Patient was at [facility] for rehab services prior to this admission and plan is to transition back to SNF. Patient is +2 for tranfers, ADLs, and bed mobilty at this time with patient needing increased cues for participation in task. Patient would continue to benefit from skilled OT services at this time while admitted and after d/c to address noted deficits in order to improve overall safety and independence in ADLs.  ?  ?   ? ?Recommendations for follow up therapy are one component of a multi-disciplinary discharge planning process, led by the attending physician.  Recommendations may be updated based on patient status, additional functional criteria and insurance authorization.  ? ?Follow Up Recommendations ? Skilled nursing-short term rehab (<3 hours/day)  ?  ?Assistance Recommended at Discharge Frequent or constant Supervision/Assistance  ?Patient can return home with the following Two people to help with walking and/or transfers;Two people to help with bathing/dressing/bathroom;Help with stairs or ramp for entrance;Direct supervision/assist for medications management;Assist for transportation;Direct supervision/assist for financial management;Assistance with cooking/housework;Assistance with feeding ? ?  ?Functional Status Assessment ? Patient has had a recent decline in their functional status and demonstrates  the ability to make significant improvements in function in a reasonable and predictable amount of time.  ?Equipment Recommendations ? None recommended by OT  ?  ?Recommendations for Other Services   ? ? ?  ?Precautions / Restrictions Precautions ?Precautions: Fall ?Restrictions ?Weight Bearing Restrictions: No  ? ?  ? ?Mobility Bed Mobility ?Overal bed mobility: Needs Assistance ?Bed Mobility: Sit to Supine ?  ?  ?  ?Sit to supine: Max assist ?  ?General bed mobility comments: with increased time and physical assist for trunk and BLE movement into bed. ?  ? ?Transfers ?  ?  ?  ?  ?  ?  ?  ?  ?  ?  ?  ? ?  ?Balance Overall balance assessment: Needs assistance ?Sitting-balance support: Bilateral upper extremity supported, Feet supported ?Sitting balance-Leahy Scale: Poor ?  ?  ?Standing balance support: Bilateral upper extremity supported, Reliant on assistive device for balance ?Standing balance-Leahy Scale: Zero ?  ?  ?  ?  ?  ?  ?  ?  ?  ?  ?  ?  ?   ? ?ADL either performed or assessed with clinical judgement  ? ?ADL Overall ADL's : Needs assistance/impaired ?Eating/Feeding: Minimal assistance;Bed level ?Eating/Feeding Details (indicate cue type and reason): sitting in bed ?Grooming: Dance movement psychotherapist;Wash/dry hands;Minimal assistance;Bed level ?  ?Upper Body Bathing: Maximal assistance;Bed level ?  ?Lower Body Bathing: Bed level;Maximal assistance ?  ?Upper Body Dressing : Bed level;Maximal assistance ?  ?Lower Body Dressing: Bed level;Maximal assistance ?  ?Toilet Transfer: +2 for physical assistance;+2 for safety/equipment;Total assistance ?Toilet Transfer Details (indicate cue type and reason): patient is a +2 TD for squat pivot from recliner to edge of bed with noted sliding of BLE with attempts at moving. patient noted to be anxoius with  movement but agreeable. ?Toileting- Water quality scientist and Hygiene: Total assistance;Bed level ?Toileting - Clothing Manipulation Details (indicate cue type and reason):  patient was able to physically assist with rollling to each side. ?  ?  ?  ?   ? ? ? ?Vision Patient Visual Report: No change from baseline ?Additional Comments: able to track daughter's location in room with eyes. no apparent deficits.  ?   ?Perception   ?  ?Praxis   ?  ? ?Pertinent Vitals/Pain Pain Assessment ?Pain Assessment: Faces ?Faces Pain Scale: Hurts a little bit ?Pain Location: bottom with hygiene tasks ?Pain Descriptors / Indicators: Discomfort, Grimacing ?Pain Intervention(s): Monitored during session, Repositioned  ? ? ? ?Hand Dominance Right ?  ?Extremity/Trunk Assessment Upper Extremity Assessment ?Upper Extremity Assessment: LUE deficits/detail;RUE deficits/detail ?RUE Deficits / Details: able to AROM shoulder above 90 degrees flexion/extension with no c/p pain. elbow and hands WFL noted to grasp more with thenar area than full fist. ?LUE Deficits / Details: limited ROM with pain when attempting to AAROM past 70 degrees. patient is generally weak in UE ?  ?  ?  ?  ?  ?Communication Communication ?Communication: HOH ?  ?Cognition Arousal/Alertness: Awake/alert ?Behavior During Therapy: Flat affect, Anxious ?Overall Cognitive Status: History of cognitive impairments - at baseline ?  ?  ?  ?  ?  ?  ?  ?  ?  ?  ?  ?  ?  ?  ?  ?  ?General Comments: patient was plesant durign session with noted confusion but able to recognize daughter and track her in the room when she moved from field of view ?  ?  ?General Comments    ? ?  ?Exercises   ?  ?Shoulder Instructions    ? ? ?Home Living Family/patient expects to be discharged to:: Skilled nursing facility ?  ?  ?  ?  ?  ?  ?  ?  ?  ?  ?  ?  ?  ?  ?  ?  ?Additional Comments: Pt has been at [facility] for rehab. ?  ? ?  ?Prior Functioning/Environment Prior Level of Function : Needs assist ?  ?  ?  ?  ?  ?  ?Mobility Comments: Patient's DTR reports she ambulates with RW with PT and w/c follow for safety. most recently getting up to geri chair is  goal. ?ADLs Comments: Assistance from staff.patient was seated in geri chair in time out of bed. ?  ? ?  ?  ?OT Problem List: Decreased activity tolerance;Decreased safety awareness;Impaired balance (sitting and/or standing);Decreased knowledge of precautions;Decreased knowledge of use of DME or AE ?  ?   ?OT Treatment/Interventions: Self-care/ADL training;Therapeutic exercise;Neuromuscular education;Energy conservation;DME and/or AE instruction;Therapeutic activities;Balance training;Patient/family education  ?  ?OT Goals(Current goals can be found in the care plan section) Acute Rehab OT Goals ?Patient Stated Goal: to get back to SNF ?OT Goal Formulation: With patient/family ?Time For Goal Achievement: 06/19/21 ?Potential to Achieve Goals: Good  ?OT Frequency: Min 2X/week ?  ? ?Co-evaluation   ?  ?  ?  ?  ? ?  ?AM-PAC OT "6 Clicks" Daily Activity     ?Outcome Measure Help from another person eating meals?: A Little ?Help from another person taking care of personal grooming?: A Little ?Help from another person toileting, which includes using toliet, bedpan, or urinal?: Total ?Help from another person bathing (including washing, rinsing, drying)?: Total ?Help from another person to put on and taking off regular upper  body clothing?: Total ?Help from another person to put on and taking off regular lower body clothing?: Total ?6 Click Score: 10 ?  ?End of Session Nurse Communication: Other (comment) (ok to see patient for therapy) ? ?Activity Tolerance: Patient tolerated treatment well ?Patient left: in bed;with call bell/phone within reach;with nursing/sitter in room;with family/visitor present ? ?OT Visit Diagnosis: Unsteadiness on feet (R26.81);Other abnormalities of gait and mobility (R26.89);Muscle weakness (generalized) (M62.81)  ?              ?Time: 5465-6812 ?OT Time Calculation (min): 26 min ?Charges:  OT General Charges ?$OT Visit: 1 Visit ?OT Evaluation ?$OT Eval Moderate Complexity: 1 Mod ?OT  Treatments ?$Self Care/Home Management : 8-22 mins ? ?Abdurahman Rugg OTR/L, MS ?Acute Rehabilitation Department ?Office# [phone] ?Pager# [phone] ? ? ?Samantha Fitzgerald ?06/05/2021, 12:55 PM ?

## 2021-06-06 DIAGNOSIS — Z7189 Other specified counseling: Secondary | ICD-10-CM | POA: Diagnosis not present

## 2021-06-06 DIAGNOSIS — I5032 Chronic diastolic (congestive) heart failure: Secondary | ICD-10-CM | POA: Diagnosis not present

## 2021-06-06 DIAGNOSIS — I471 Supraventricular tachycardia: Secondary | ICD-10-CM | POA: Diagnosis not present

## 2021-06-06 DIAGNOSIS — J9601 Acute respiratory failure with hypoxia: Secondary | ICD-10-CM | POA: Diagnosis not present

## 2021-06-06 LAB — CBC WITH DIFFERENTIAL/PLATELET
Abs Immature Granulocytes: 0.02 10*3/uL (ref 0.00–0.07)
Basophils Absolute: 0.1 10*3/uL (ref 0.0–0.1)
Basophils Relative: 1 %
Eosinophils Absolute: 0.5 10*3/uL (ref 0.0–0.5)
Eosinophils Relative: 8 %
HCT: 30.5 % — ABNORMAL LOW (ref 36.0–46.0)
Hemoglobin: 9.4 g/dL — ABNORMAL LOW (ref 12.0–15.0)
Immature Granulocytes: 0 %
Lymphocytes Relative: 20 %
Lymphs Abs: 1.2 10*3/uL (ref 0.7–4.0)
MCH: 28.9 pg (ref 26.0–34.0)
MCHC: 30.8 g/dL (ref 30.0–36.0)
MCV: 93.8 fL (ref 80.0–100.0)
Monocytes Absolute: 0.5 10*3/uL (ref 0.1–1.0)
Monocytes Relative: 8 %
Neutro Abs: 3.9 10*3/uL (ref 1.7–7.7)
Neutrophils Relative %: 63 %
Platelets: 406 10*3/uL — ABNORMAL HIGH (ref 150–400)
RBC: 3.25 MIL/uL — ABNORMAL LOW (ref 3.87–5.11)
RDW: 14.4 % (ref 11.5–15.5)
WBC: 6.2 10*3/uL (ref 4.0–10.5)
nRBC: 0 % (ref 0.0–0.2)

## 2021-06-06 LAB — PHOSPHORUS: Phosphorus: 3.2 mg/dL (ref 2.5–4.6)

## 2021-06-06 LAB — COMPREHENSIVE METABOLIC PANEL
ALT: 10 U/L (ref 0–44)
AST: 14 U/L — ABNORMAL LOW (ref 15–41)
Albumin: 2.9 g/dL — ABNORMAL LOW (ref 3.5–5.0)
Alkaline Phosphatase: 70 U/L (ref 38–126)
Anion gap: 7 (ref 5–15)
BUN: 18 mg/dL (ref 8–23)
CO2: 38 mmol/L — ABNORMAL HIGH (ref 22–32)
Calcium: 8.7 mg/dL — ABNORMAL LOW (ref 8.9–10.3)
Chloride: 92 mmol/L — ABNORMAL LOW (ref 98–111)
Creatinine, Ser: 0.6 mg/dL (ref 0.44–1.00)
GFR, Estimated: >60 mL/min (ref >60)
Glucose, Bld: 91 mg/dL (ref 70–99)
Potassium: 3.6 mmol/L (ref 3.5–5.1)
Sodium: 137 mmol/L (ref 135–145)
Total Bilirubin: 0.4 mg/dL (ref 0.3–1.2)
Total Protein: 5.8 g/dL — ABNORMAL LOW (ref 6.5–8.1)

## 2021-06-06 LAB — MAGNESIUM: Magnesium: 1.8 mg/dL (ref 1.7–2.4)

## 2021-06-06 LAB — GLUCOSE, CAPILLARY: Glucose-Capillary: 118 mg/dL — ABNORMAL HIGH (ref 70–99)

## 2021-06-06 MED ORDER — AMIODARONE HCL IN DEXTROSE 360-4.14 MG/200ML-% IV SOLN: 30.0000 mg/h | INTRAVENOUS | Status: DC

## 2021-06-06 MED ORDER — CHLORHEXIDINE GLUCONATE CLOTH 2 % EX PADS
6.0000 | MEDICATED_PAD | Freq: Every day | CUTANEOUS | Status: DC
  Administered 2021-06-06 – 2021-06-11 (×5): 6 via TOPICAL

## 2021-06-06 MED ORDER — AMIODARONE LOAD VIA INFUSION
150.0000 mg | Freq: Once | INTRAVENOUS | Status: DC
  Filled 2021-06-06: qty 83.34

## 2021-06-06 MED ORDER — FENTANYL CITRATE PF 50 MCG/ML IJ SOSY
PREFILLED_SYRINGE | INTRAMUSCULAR | Status: AC
  Filled 2021-06-06: qty 1

## 2021-06-06 MED ORDER — AMIODARONE HCL IN DEXTROSE 360-4.14 MG/200ML-% IV SOLN
60.0000 mg/h | INTRAVENOUS | Status: DC
  Filled 2021-06-06: qty 200

## 2021-06-06 MED ORDER — AMIODARONE HCL IN DEXTROSE 360-4.14 MG/200ML-% IV SOLN
60.0000 mg/h | INTRAVENOUS | Status: AC
  Administered 2021-06-06 (×2): 60 mg/h via INTRAVENOUS
  Filled 2021-06-06 (×2): qty 200

## 2021-06-06 MED ORDER — POTASSIUM CHLORIDE 20 MEQ PO PACK
20.0000 meq | PACK | Freq: Once | ORAL | Status: AC
  Administered 2021-06-06: 20 meq via ORAL
  Filled 2021-06-06: qty 1

## 2021-06-06 MED ORDER — MAGNESIUM SULFATE 2 GM/50ML IV SOLN
2.0000 g | Freq: Once | INTRAVENOUS | Status: AC
  Administered 2021-06-06: 2 g via INTRAVENOUS
  Filled 2021-06-06: qty 50

## 2021-06-06 MED ORDER — FUROSEMIDE 10 MG/ML IJ SOLN
40.0000 mg | Freq: Every day | INTRAMUSCULAR | Status: DC
  Administered 2021-06-06: 40 mg via INTRAVENOUS
  Filled 2021-06-06: qty 4

## 2021-06-06 MED ORDER — LABETALOL HCL 5 MG/ML IV SOLN
10.0000 mg | INTRAVENOUS | Status: DC | PRN
  Filled 2021-06-06: qty 4

## 2021-06-06 MED ORDER — HYDRALAZINE HCL 20 MG/ML IJ SOLN
10.0000 mg | INTRAMUSCULAR | Status: DC | PRN
  Administered 2021-06-07: 10 mg via INTRAVENOUS
  Filled 2021-06-06: qty 1

## 2021-06-06 MED ORDER — AMIODARONE LOAD VIA INFUSION: 150.0000 mg | Freq: Once | INTRAVENOUS | Status: DC

## 2021-06-06 MED ORDER — AMIODARONE HCL IN DEXTROSE 360-4.14 MG/200ML-% IV SOLN
30.0000 mg/h | INTRAVENOUS | Status: DC
Start: 2021-06-06 — End: 2021-06-09
  Administered 2021-06-06 – 2021-06-09 (×5): 30 mg/h via INTRAVENOUS
  Filled 2021-06-06 (×5): qty 200

## 2021-06-06 MED ORDER — FUROSEMIDE 20 MG PO TABS
20.0000 mg | ORAL_TABLET | Freq: Every day | ORAL | Status: DC
Start: 2021-06-07 — End: 2021-06-11
  Administered 2021-06-08 – 2021-06-11 (×4): 20 mg via ORAL
  Filled 2021-06-06 (×4): qty 1

## 2021-06-06 MED ORDER — FENTANYL CITRATE PF 50 MCG/ML IJ SOSY: 12.5000 ug | PREFILLED_SYRINGE | INTRAMUSCULAR | Status: DC | PRN

## 2021-06-06 MED ORDER — SODIUM CHLORIDE 0.9 % IV BOLUS
250.0000 mL | Freq: Once | INTRAVENOUS | Status: AC
  Administered 2021-06-06: 250 mL via INTRAVENOUS

## 2021-06-06 NOTE — Significant Event (Signed)
Rapid Response Event Note  ? ?Reason for Call :  ?RN reported patient hypotensive w/ systolic in 98X and complaining of shortness of breath ? ?Initial Focused Assessment:  ?Patient completing albuterol treatment upon arrival. BP 74/48, spO2 100% on 4L [place], RR 18. Patient repeating "Please help me, I can't breathe." Patient recently discharged from SDU. Daughter and Dr. Domingo Cocking at bedside.  ? ? ? ? ?Interventions:  ?250 mL bolus ?EKG ?Patient spontaneously converted to NSR, BP improved to 120/56; amio bolus held per cardiology.  ?Amiodarone gtt ordered ? ?Plan of Care:  ?Transfer to SDU, plan for [place] discussion when able to reach second daughter ? ? ?Event Summary:  ? ?MD Notified: 1100 ?Call Time: 1051 ?Arrival Time: 2158 ?End Time: 7276 ? ?Cyndie Chime, RN ?

## 2021-06-06 NOTE — Progress Notes (Signed)
?PROGRESS NOTE ? ? ? ?Samantha Fitzgerald  FFM:[id number] DOB: 05/23/1924 DOA: 06/01/2021 ?PCP: System, Provider Not In  ? ?Brief Narrative: ?88 year old with past medical history significant for COPD chronic respiratory failure with hypoxia on 3 L of oxygen at home, malignant neoplasm of the bronchus and lung, pleural effusion, chronic diastolic heart failure, essential hypertension, PSVT, pulmonary hypertension, sick sinus syndrome, history of DVT who was brought to the ER.  She was complaining of worsening shortness of breath.  Evaluation in the ED patient was found to be in SVT with heart rate in the 140.  CT angio of the chest was negative for PE did show bilateral pleural effusion and groundglass opacity concerning for pneumonia. ? ?Her SVT she received a dose of adenosine, which did not resolve the SVT.  Subsequently she was a started on amiodarone drip.  Subsequently transition to flecainide.  ? ?Cardiology consulted to assist with management of heart failure exacerbation and arrhythmia. ? ?Patient develops hypotension, SBP in the 60--70, HR 130 A fib. She received IV bolus, SBP increased 95--subsequently she converted to sinus and BP increase 120. Sh will be started on IV amiodarone.  ? ? ? ?Assessment & Plan: ?  ?Principal Problem: ?  Acute respiratory failure with hypoxia ([place]) ?Active Problems: ?  Pleural effusion on left ?  COPD (chronic obstructive pulmonary disease) ([place]) ?  Sick sinus syndrome ([place]) ?  Chronic diastolic CHF (congestive heart failure) ([place]) ?  Acute on chronic respiratory failure with hypoxemia ([facility]) ?  History of DVT (deep vein thrombosis) ?  Paroxysmal SVT (supraventricular tachycardia) ([facility]) ?  Acute respiratory failure with hypoxemia ([facility]) ?  Pulmonary hypertension ([place]) ?  Bilateral pleural effusion ?  Protein-calorie malnutrition, severe ([place]) ?  Decubitus ulcer of sacral region, stage 1 ? ? ?1-SVT: ?Per admission note 3/19 on-call cardiology  Dr. Zigmund Daniel recommended IV  adenosine, if not improvement start amiodarone.  ?Patient was started on IV amiodarone, subsequently transition to Flecainide.  ?3/22: Cardiology consulted to assist with arrhythmia. Dr Alba Cory recommend stop flecainide and start amiodarone. Also recommended palliative care consultation.  ?3/23: -Patient develops tachycardia HR 130 A fib--Hypotension, she was SOB. She received IV Bolus. Subsequently BP increased to 95. Patient then sinus converted--SBP increased 120. Plan is to start Amiodarone infusion. Daughter will discussed with sister in regards Goals of care moving forward regarding IV pressors, or further intervention for arrhythmia.  ? ?Acute on chronic hypoxic respiratory failure; secondary to bilateral pleural effusion and heart failure exacerbation ?-Patient also completed 5 days of antibiotics ?-Continue with DuoNeb ?-Received IV lasix today, change to oral starting tomorrow if BP stable.  ? ?2-COPD exacerbation: Continue with DuoNeb. ? ?3-Bilateral pleural effusion: Received IV Lasix ?X-ray 3/21 persistent bilateral pleural effusion ?Received IV lasix today, change to oral starting tomorrow.  ?Might need to hold lasix if BP is low.  ? ?4-Acute chronic diastolic heart failure: ?Presented with shortness of breath, bilateral pleural effusion elevated BNP ?Change lasix to oral.  ?Cardiology consulted ? ? ? ?Pressure Injury 02/27/21 Sacrum Medial Stage 1 -  Intact skin with non-blanchable redness of a localized area usually over a bony prominence. (Active)  ?02/27/21 0200  ?Location: Sacrum  ?Location Orientation: Medial  ?Staging: Stage 1 -  Intact skin with non-blanchable redness of a localized area usually over a bony prominence.  ?Wound Description (Comments):   ?Present on Admission: Yes  ?   ?Pressure Injury 06/02/21 Sacrum Stage 2 -  Partial thickness loss of dermis presenting as  a shallow open injury with a red, pink wound bed without slough. stage 2 with non-blanchable redness (Active)  ?06/02/21  0404  ?Location: Sacrum  ?Location Orientation:   ?Staging: Stage 2 -  Partial thickness loss of dermis presenting as a shallow open injury with a red, pink wound bed without slough.  ?Wound Description (Comments): stage 2 with non-blanchable redness  ?Present on Admission: Yes  ?Dressing Type Foam - Lift dressing to assess site every shift 06/05/21 0800  ?   ?Pressure Injury 06/02/21 Vertebral column Bilateral Stage 1 -  Intact skin with non-blanchable redness of a localized area usually over a bony prominence. (Active)  ?06/02/21 0404  ?Location: Vertebral column  ?Location Orientation: Bilateral  ?Staging: Stage 1 -  Intact skin with non-blanchable redness of a localized area usually over a bony prominence.  ?Wound Description (Comments):   ?Present on Admission: Yes  ?Dressing Type Foam - Lift dressing to assess site every shift 06/05/21 0800  ? ? ? ?Nutrition Problem: Severe Malnutrition ?Etiology: chronic illness ? ? ? ?Signs/Symptoms: severe fat depletion, severe muscle depletion ? ? ? ?Interventions: Ensure Enlive (each supplement provides 350kcal and 20 grams of protein), Liberalize Diet ? ?Estimated body mass index is 16.72 kg/m? as calculated from the following: ?  Height as of this encounter: 5\' 6"  (1.676 m). ?  Weight as of this encounter: 47 kg. ? ? ?DVT prophylaxis: Eliquis ?Code Status: DNR ?Family Communication: Daughter at bedside.  ?Disposition Plan:  ?Status is: Inpatient ?Remains inpatient appropriate because: remain in patient for IV lasix.  ? ? ? ?Consultants:  ?Cardiology  ? ?Procedures:  ?None ? ?Antimicrobials:  ? ? ?Subjective: ?She had episode of SOB, asking for help, SBP drop to 70. HR 130.  ?After stabilization of BP-and sinus conversion she is feeling better.  ? ?Objective: ?Vitals:  ? 06/06/21 0945 06/06/21 1110 06/06/21 1120 06/06/21 1126  ?BP: 108/66 (!) 67/46 (!) 88/65 95/60  ?Pulse: (!) 130     ?Resp: 20     ?Temp: (!) 97.5 ?F (36.4 ?C)     ?TempSrc: Oral     ?SpO2: 98%      ?Weight:      ?Height:      ? ? ?Intake/Output Summary (Last 24 hours) at 06/06/2021 1141 ?Last data filed at 06/06/2021 0600 ?Gross per 24 hour  ?Intake 760 ml  ?Output 750 ml  ?Net 10 ml  ? ? ?Filed Weights  ? 06/01/21 2106 06/02/21 0802 06/04/21 0400  ?Weight: 54.4 kg 49.5 kg 47 kg  ? ? ?Examination: ? ?General exam: Mild distress ?Respiratory system: BL crackles.  ?Cardiovascular system:  S1, S 2 RRR. Positive JVD ?Gastrointestinal system: BS present, soft, nt ?Central nervous system: Alert, following command ?Extremities: No edema ? ? ?Data Reviewed: I have personally reviewed following labs and imaging studies ? ?CBC: ?Recent Labs  ?Lab 06/01/21 ?2146 06/02/21 ?2585 06/03/21 ?0302 06/04/21 ?0225 06/05/21 ?2778 06/06/21 ?2423  ?WBC 8.9 6.1 6.5 6.0 7.9 6.2  ?NEUTROABS 6.8  --  4.4 3.8 5.4 3.9  ?HGB 9.6* 8.8* 8.8* 8.5* 9.0* 9.4*  ?HCT 30.0* 27.4* 27.5* 26.6* 28.7* 30.5*  ?MCV 92.3 92.6 91.1 92.7 94.7 93.8  ?PLT 391 460* 438* 419* 423* 406*  ? ? ?Basic Metabolic Panel: ?Recent Labs  ?Lab 06/02/21 ?5361 06/03/21 ?0302 06/04/21 ?0225 06/05/21 ?4431 06/06/21 ?5400  ?NA 130* 134* 134* 136 137  ?K 4.2 3.2* 3.6 3.7 3.6  ?CL 90* 93* 95* 93* 92*  ?CO2 33* 34* 34* 35*  38*  ?GLUCOSE 106* 85 85 93 91  ?BUN 11 9 11 15 18   ?CREATININE 0.66 0.54 0.45 0.51 0.60  ?CALCIUM 8.0* 8.3* 8.0* 8.3* 8.7*  ?MG  --  1.7 2.0 2.0 1.8  ?PHOS  --  3.4 3.2 3.0 3.2  ? ? ?GFR: ?Estimated Creatinine Clearance: 31.9 mL/min (by C-G formula based on SCr of 0.6 mg/dL). ?Liver Function Tests: ?Recent Labs  ?Lab 06/02/21 ?9643 06/03/21 ?0302 06/04/21 ?0225 06/05/21 ?8381 06/06/21 ?8403  ?AST 12* 12* 12* 11* 14*  ?ALT 9 10 8 9 10   ?ALKPHOS 71 67 60 66 70  ?BILITOT 0.2* 0.1* <0.1* 0.1* 0.4  ?PROT 5.2* 5.8* 5.1* 5.6* 5.8*  ?ALBUMIN 2.5* 3.2* 2.7* 3.0* 2.9*  ? ? ?No results for input(s): LIPASE, AMYLASE in the last 168 hours. ?No results for input(s): AMMONIA in the last 168 hours. ?Coagulation Profile: ?No results for input(s): INR, PROTIME in the last 168  hours. ?Cardiac Enzymes: ?No results for input(s): CKTOTAL, CKMB, CKMBINDEX, TROPONINI in the last 168 hours. ?BNP (last 3 results) ?No results for input(s): PROBNP in the last 8760 hours. ?HbA1C: ?No results for inpu

## 2021-06-06 NOTE — Progress Notes (Signed)
PT Cancellation Note ? ?Patient Details ?Name: Samantha Fitzgerald ?MRN: [id number] ?DOB: 11/03/1924 ? ? ?Cancelled Treatment:    Reason Eval/Treat Not Completed: Medical issues which prohibited therapy (HR is 127 bpm at rest. Will hold and check back when pt's HR more controlled. Will follow up as schedule allows.) ? ?Gwynneth Albright PT, DPT ?Acute Rehabilitation Services ?Office [phone] ?Pager [phone] ? ?

## 2021-06-06 NOTE — Progress Notes (Signed)
? ?                                                                                                                                                     ?                                                   ?Daily Progress Note  ? ?Patient Name: Samantha Fitzgerald       Date: 06/06/2021 ?DOB: 06/10/1924  Age: 88 y.o. MRN#: [id number] ?Attending Physician: Elmarie Shiley, MD ?Primary Care Physician: System, Provider Not In ?Admit Date: 06/01/2021 ? ?Reason for Consultation/Follow-up: Establishing goals of care ? ?Subjective: ?I saw and examined Ms. Samantha Fitzgerald on 2 occasions today including initial encounter at time of rapid response this morning.  During this, Ms. Samantha Fitzgerald stated multiple times that she was feeling very short of breath and asked for something to help with this.  I discussed the bedside with her daughter regarding aggressiveness of care moving forward and also concerned that we may be trying to work against 2 different goals with plan for continued aggressive interventions and also wanting to ensure that she is not suffering and comfortable.  After interventions, her shortness of breath and overall clinical status improved when she was transferred to the ICU. ? ?During second encounter, I had a family meeting with patient's daughter, Samantha Fitzgerald, in person and her other daughter, Samantha Fitzgerald, via phone. ?We discussed clinical course as well as wishes moving forward in regard to care plan this hospitalization.  We discussed difference between a aggressive medical intervention path and a palliative, comfort focused care path.  Values and goals of care important to patient and family were attempted to be elicited. ? ?Following conversation, decision made to continue with current care plan without escalation while also concurrently focusing on symptoms and giving medications as needed to ensure that she is as comfortable as possible regardless of concern for side effects such as hypotension. ? ?Questions and concerns  addressed.   PMT will continue to support holistically. ?  ?Length of Stay: 5 ? ?Current Medications: ?Scheduled Meds:  ? apixaban  5 mg Oral Q12H  ? azithromycin  500 mg Oral QHS  ? [START ON 06/07/2021] Chlorhexidine Gluconate Cloth  6 each Topical Q0600  ? COVID-19 mRNA bivalent vaccine (Pfizer)  0.3 mL Intramuscular Once  ? digoxin  0.125 mg Oral Daily  ? feeding supplement  237 mL Oral BID BM  ? fentaNYL      ? fluticasone furoate-vilanterol  1 puff Inhalation Daily  ? And  ? umeclidinium bromide  1 puff Inhalation Daily  ? [START ON 06/07/2021] furosemide  20 mg Oral Daily  ?  mouth rinse  15 mL Mouth Rinse BID  ? sertraline  25 mg Oral Daily  ? ? ?Continuous Infusions: ? amiodarone 60 mg/hr (06/06/21 1500)  ? amiodarone    ? cefTRIAXone (ROCEPHIN)  IV Stopped (06/06/21 0027)  ? ? ?PRN Meds: ?acetaminophen **OR** acetaminophen, fentaNYL (SUBLIMAZE) injection, ipratropium-albuterol, LORazepam ? ?Physical Exam      ?General: Frail  ?Heart: Irregular and tachycardic  ?lungs: Fair air movement, clear ?Abdomen: Soft, nontender, nondistended, positive bowel sounds.   ?Ext: No significant edema ?Skin: Warm and dry ?Neuro: Grossly intact, nonfocal.  Pleasant and conversational but does not have full insight into her overall condition. ?    ? ?Vital Signs: BP (!) 139/45 (BP Location: Right Arm)   Pulse 69   Temp 97.6 ?F (36.4 ?C) (Axillary)   Resp (!) 22   Ht 5\' 6"  (1.676 m)   Wt 47 kg   SpO2 100%   BMI 16.72 kg/m?  ?SpO2: SpO2: 100 % ?O2 Device: O2 Device: Nasal Cannula ?O2 Flow Rate: O2 Flow Rate (L/min): 3 L/min ? ?Intake/output summary:  ?Intake/Output Summary (Last 24 hours) at 06/06/2021 1636 ?Last data filed at 06/06/2021 1600 ?Gross per 24 hour  ?Intake 536.8 ml  ?Output 650 ml  ?Net -113.2 ml  ? ?LBM: Last BM Date : 06/06/21 ?Baseline Weight: Weight: 54.4 kg ?Most recent weight: Weight: 47 kg ? ?     ?Palliative Assessment/Data: ? ? ? ?Flowsheet Rows   ? ?Flowsheet Row Most Recent Value  ?Intake Tab    ?Referral Department Hospitalist  ?Unit at Time of Referral ICU  ?Palliative Care Primary Diagnosis Cardiac  ?Date Notified 06/05/21  ?Palliative Care Type New Palliative care  ?Reason for referral Clarify Goals of Care  ?Date of Admission 06/01/21  ?Date first seen by Palliative Care 06/05/21  ?# of days Palliative referral response time 0 Day(s)  ?# of days IP prior to Palliative referral 4  ?Clinical Assessment   ?Palliative Performance Scale Score 40%  ?Psychosocial & Spiritual Assessment   ?Palliative Care Outcomes   ?Patient/Family meeting held? Yes  ?Who was at the meeting? patient  ? ?  ? ? ?Patient Active Problem List  ? Diagnosis Date Noted  ? Decubitus ulcer of sacral region, stage 1 06/04/2021  ? Paroxysmal SVT (supraventricular tachycardia) ([place]) 06/02/2021  ? Acute respiratory failure with hypoxemia ([place]) 06/02/2021  ? Pulmonary hypertension ([place]) 06/02/2021  ? Bilateral pleural effusion 06/02/2021  ? Protein-calorie malnutrition, severe ([place]) 06/02/2021  ? Acute respiratory failure with hypoxia ([place]) 06/01/2021  ? Acute on chronic respiratory failure with hypoxia ([employer]) 05/08/2021  ? COPD with acute exacerbation ([place]) 05/08/2021  ? History of DVT (deep vein thrombosis) 05/08/2021  ? Pressure injury of skin 02/27/2021  ? Acute on chronic respiratory failure with hypoxemia ([place]) 02/26/2021  ? Bacteremia   ? Palliative care by specialist   ? Goals of care, counseling/discussion   ? General weakness   ? Sepsis ([place]) 12/03/2020  ? COVID-19 virus infection 12/03/2020  ? Acute lower UTI 12/03/2020  ? Cellulitis 12/03/2020  ? Chronic diastolic CHF (congestive heart failure) ([place]) 12/03/2020  ? Hypoalbuminemia 12/03/2020  ? AKI (acute kidney injury) ([place]) 12/03/2020  ? Pleural effusion on left 09/12/2020  ? COPD (chronic obstructive pulmonary disease) ([place]) 09/12/2020  ? Sick sinus syndrome ([place]) 09/12/2020  ? Hypoxia 09/12/2020  ? ? ?Palliative Care Assessment & Plan  ? ?Patient Profile: ?88 y.o. female  with  past medical history of COPD, lung, pleural effusions, chronic diastolic  heart failure, essential hypertension, PSVT, hypertension, syndrome, admitted on 06/01/2021 with acute on chronic hypoxic respiratory failure secondary to heart failure, COPD exacerbation, bilateral pleural effusions. ? ?Palliative consult for goals of care. ? ?Recommendations/Plan: ?DNR/DNI ?Continue current care without escalation.  We discussed specifically avoiding pressor support in the event of further decompensation and family is in agreement.  Plan will be to continue with gentle medical interventions while also concurrently ensuring that she has medications as needed for any symptoms.  Family understands she is at high risk for further decompensation and primary goal in this instance would be to ensure she does not suffer. ?If she stabilizes further (which she appears to be doing on reexamination in ICU) we will continue conversation regarding long-term goals of care based upon how she does over the next 24 to 48 hours. ? ? ?Code Status: ? ?  ?Code Status Orders  ?(From admission, onward)  ?  ? ? ?  ? ?  Start     Ordered  ? 06/02/21 0045  Do not attempt resuscitation (DNR)  Continuous       ?Question Answer Comment  ?In the event of cardiac or respiratory ARREST Do not call a ?code blue?   ?In the event of cardiac or respiratory ARREST Do not perform Intubation, CPR, defibrillation or ACLS   ?In the event of cardiac or respiratory ARREST Use medication by any route, position, wound care, and other measures to relive pain and suffering. May use oxygen, suction and manual treatment of airway obstruction as needed for comfort.   ?  ? 06/02/21 0046  ? ?  ?  ? ?  ? ?Code Status History   ? ? Date Active Date Inactive Code Status Order ID Comments User Context  ? 06/02/2021 0006 06/02/2021 0046 DNR [id number]  Rise Patience, MD ED  ? 05/08/2021 2338 05/10/2021 2334 DNR [id number]  Orene Desanctis, DO ED  ? 02/27/2021 0731 03/02/2021 2039 DNR  [id number]  [place] Eric J, DO Inpatient  ? 02/26/2021 1805 02/27/2021 0731 DNR [id number]  Orvis Brill, MD ED  ? 12/03/2020 2056 12/14/2020 1539 DNR [id number]  Toy Baker, MD ED  ? 12/03/2020 2008 9/19/

## 2021-06-06 NOTE — Consult Note (Signed)
? ?                                                                                ?Consultation Note ?Date: 06/06/2021  ? ?Patient Name: Samantha Fitzgerald  ?DOB: 29-Oct-1924  MRN: [id number]  Age / Sex: 88 y.o., female  ?PCP: System, Provider Not In ?Referring Physician: Elmarie Shiley, MD ? ?Reason for Consultation: Establishing goals of care ? ?HPI/Patient Profile: 88 y.o. female  with past medical history of COPD, lung, pleural effusions, chronic diastolic heart failure, essential hypertension, PSVT, hypertension, syndrome, admitted on 06/01/2021 with acute on chronic hypoxic respiratory failure secondary to heart failure, COPD exacerbation, bilateral pleural effusions. ? ?Palliative consult for goals of care. ? ?Clinical Assessment and Goals of Care: ?I met today with Samantha Fitzgerald.  ? ?I introduced palliative care as specialized medical care for people living with serious illness. It focuses on providing relief from the symptoms and stress of a serious illness. The goal is to improve quality of life for both the patient and the family. ? ?She reports that the  most important thing to her is her family.  This includes her daughter, Samantha Fitzgerald, who lives locally and anoter daughter who lives in [place].  She is a retired Therapist, sports and a widow.  Her husband was a Education officer, environmental.  Faith is important to her as well.  She reports that she has been living long term at SNF and her daughter visits her daily.   ? ?We had a preliminary discussion regarding the difference between a aggressive medical intervention path and a palliative, comfort focused care path.  Values and goals of care important to patient and family were attempted to be elicited. ?  ?She reports understanding need for this conversation, but she would like for her daughter to be present.  She reports her daughter will be back tomorrow and asked that we follow up at that time. ? ?SUMMARY OF RECOMMENDATIONS   ?- DNR ?- Plan for follow-up tomorrow per patient  request to continue conversation regarding long term [facility]. ? ?Code Status/Advance Care Planning: ?DNR ? ? ?Discharge Planning: To Be Determined  ? ?  ? ?Primary Diagnoses: ?Present on Admission: ? Acute respiratory failure with hypoxia ([place]) ? COPD (chronic obstructive pulmonary disease) ([place]) ? Paroxysmal SVT (supraventricular tachycardia) (H[facility]) ? Acute respiratory failure with hypoxemia (H[facility]) ? Pleural effusion on left ? Sick sinus syndrome ([place]) ? Chronic diastolic CHF (congestive heart failure) ([place]) ? Pulmonary hypertension ([place]) ? Acute on chronic respiratory failure with hypoxemia (HCC) ? Bilateral pleural effusion ? Protein-calorie malnutrition, severe ([place]) ? Decubitus ulcer of sacral region, stage 1 ? ? ?I have reviewed the medical record, interviewed the patient and family, and examined the patient. The following aspects are pertinent. ? ?Past Medical History:  ?Diagnosis Date  ? Anemia   ? CHF (congestive heart failure) ([place])   ? 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 COPD (chronic obstructive pulmonary disease) ([place])   ? Dysphagia   ? GERD without esophagitis   ? Hyperlipidemia   ? Hypokalemia   ? Hypomagnesemia   ? Malignant neoplasm of bronchus and lung ([place])   ?  Osteoporosis   ? Pleural effusion   ? Pneumonia   ? Pulmonary hypertension ([place])   ? Respiratory failure ([place])   ? Retention of urine   ? Sick sinus syndrome ([place])   ? Unspecified protein-calorie malnutrition ([place])   ? Ventricular premature depolarization   ? ?Social History  ? ?Socioeconomic History  ? Marital status: Widowed  ?  Spouse name: Not on file  ? Number of children: Not on file  ? Years of education: Not on file  ? Highest education level: Not on file  ?Occupational History  ? Not on file  ?Tobacco Use  ? Smoking status: Former  ?  Packs/day: 1.00  ?  Years: 25.00  ?  Pack years: 25.00  ?  Types: Cigarettes  ?  Quit date: 10/01/1963  ?  Years since quitting: 3.7  ?  Passive exposure: Never  ? Smokeless  tobacco: Never  ?Vaping Use  ? Vaping Use: Never used  ?Substance and Sexual Activity  ? Alcohol use: Never  ? Drug use: Never  ? Sexual activity: Not on file  ?Other Topics Concern  ? Not on file  ?Social History Narrative  ? Not on file  ? ?Social Determinants of Health  ? ?Financial Resource Strain: Not on file  ?Food Insecurity: Not on file  ?Transportation Needs: Not on file  ?Physical Activity: Not on file  ?Stress: Not on file  ?Social Connections: Not on file  ? ?Family History  ?Problem Relation Age of Onset  ? Hypertension Other   ? ?Scheduled Meds: ? amiodarone  200 mg Oral Daily  ? apixaban  5 mg Oral Q12H  ? azithromycin  500 mg Oral QHS  ? COVID-19 mRNA bivalent vaccine (Pfizer)  0.3 mL Intramuscular Once  ? digoxin  0.125 mg Oral Daily  ? feeding supplement  237 mL Oral BID BM  ? fluticasone furoate-vilanterol  1 puff Inhalation Daily  ? And  ? umeclidinium bromide  1 puff Inhalation Daily  ? furosemide  40 mg Intravenous Daily  ? mouth rinse  15 mL Mouth Rinse BID  ? sertraline  25 mg Oral Daily  ? ?Continuous Infusions: ? cefTRIAXone (ROCEPHIN)  IV 2 g (06/05/21 2357)  ? ?PRN Meds:.acetaminophen **OR** acetaminophen, ipratropium-albuterol, LORazepam ?Medications Prior to Admission:  ?Prior to Admission medications   ?Medication Sig Start Date End Date Taking? Authorizing Provider  ?albuterol (VENTOLIN HFA) 108 (90 Base) MCG/ACT inhaler Inhale 2 puffs into the lungs in the morning and at bedtime.   Yes [provider]  ?albuterol (VENTOLIN HFA) 108 (90 Base) MCG/ACT inhaler Inhale 2 puffs into the lungs every 4 (four) hours as needed for wheezing.   Yes [provider]  ?apixaban (ELIQUIS) 5 MG TABS tablet Take 1 tablet (5 mg total) by mouth 2 (two) times daily. ?Patient taking differently: Take 5 mg by mouth every 12 (twelve) hours. 12/14/20  Yes Gherghe, Vella Redhead, MD  ?digoxin (LANOXIN) 0.125 MG tablet Take 0.125 mg by mouth daily. Hold and notify MD if HR less than 60 04/25/20  Yes  [provider]  ?Ensure (ENSURE) Take 237 mLs by mouth 3 (three) times daily between meals.   Yes [provider]  ?flecainide (TAMBOCOR) 50 MG tablet Take 75 mg by mouth 2 (two) times daily. for Sick Sinus Syndrome 07/27/20  Yes [provider]  ?furosemide (LASIX) 20 MG tablet Take 20 mg by mouth daily. 08/05/20  Yes [provider]  ?loperamide (IMODIUM A-D) 2 MG tablet  Take 2 mg by mouth as needed for diarrhea or loose stools. After each subsequent loose BM, give 2 tabs   Yes [provider]  ?loperamide (IMODIUM A-D) 2 MG tablet Take 2-4 mg by mouth as needed (give 2 tabs after 1st loose BM; then 1 tab after each subsequent loose BM.).   Yes [provider]  ?midodrine (PROAMATINE) 10 MG tablet Take 10 mg by mouth in the morning and at bedtime.   Yes [provider]  ?sertraline (ZOLOFT) 25 MG tablet Take 25 mg by mouth daily. 02/03/21  Yes [provider]  ?tamsulosin (FLOMAX) 0.4 MG CAPS capsule Take 1 capsule (0.4 mg total) by mouth daily after breakfast. 12/14/20  Yes Gherghe, Vella Redhead, MD  ?Viviana Simpler ELLIPTA 100-62.5-25 MCG/INH AEPB Take 1 puff by mouth daily. 08/06/20  Yes [provider]  ?OXYGEN Inhale 2 L into the lungs continuous.    [provider]  ? ?Allergies  ?Allergen Reactions  ? Amoxicillin Nausea Only and Other (See Comments)  ?  Listed on MAR  ? Doxycycline Nausea Only and Other (See Comments)  ?  Listed on MAR  ? Levofloxacin Nausea Only and Other (See Comments)  ?  Not documented on the [facility]  ? Morphine And Related Nausea Only and Other (See Comments)  ?  Listed on MAR  ? ?Review of Systems ?Denies needs at this time. ? ?Physical Exam ?General: Alert, awake, in no acute distress.   ?Heart: Regular rate and rhythm. No murmur appreciated. ?Lungs: Fair air movement, scattered crackles ?Abdomen: Soft, nontender, nondistended, positive bowel sounds.   ?Ext: No significant edema ?Skin: Warm and dry ?Neuro:  Grossly intact, nonfocal. ? ? ?Vital Signs: BP (!) 142/56 (BP Location: Left Arm)   Pulse 65   Temp 97.7 ?F (36.5 ?C) (Oral)   Resp 20   Ht _0  (1.676 m)   Wt 47 kg   SpO2 98%   BMI 16.72 kg/m?  ?Pain Scale: 0

## 2021-06-06 NOTE — Progress Notes (Signed)
? ?Progress Note ? ?Patient Name: Samantha Fitzgerald ?Date of Encounter: 06/06/2021 ? ?Primary Cardiologist: None  ? ?Subjective  ? ?Hemodynamically unstable in SVT vs atrial flutter today. Called emergently to the bedside room 1429. Dr. Tyrell Antonio and daughter [name] at bedside. ? ?Inpatient Medications  ?  ?Scheduled Meds: ? apixaban  5 mg Oral Q12H  ? azithromycin  500 mg Oral QHS  ? COVID-19 mRNA bivalent vaccine (Pf[employer])  0.3 mL Intramuscular Once  ? digoxin  0.125 mg Oral Daily  ? feeding supplement  237 mL Oral BID BM  ? fentaNYL      ? fluticasone furoate-vilanterol  1 puff Inhalation Daily  ? And  ? umeclidinium bromide  1 puff Inhalation Daily  ? [START ON 06/07/2021] furosemide  20 mg Oral Daily  ? mouth rinse  15 mL Mouth Rinse BID  ? potassium chloride  20 mEq Oral Once  ? sertraline  25 mg Oral Daily  ? ?Continuous Infusions: ? amiodarone    ? amiodarone    ? cefTRIAXone (ROCEPHIN)  IV 2 g (06/05/21 2357)  ? magnesium sulfate bolus IVPB    ? ?PRN Meds: ?acetaminophen **OR** acetaminophen, fentaNYL (SUBLIMAZE) injection, ipratropium-albuterol, LORazepam  ? ?Vital Signs  ?  ?Vitals:  ? 06/06/21 1110 06/06/21 1120 06/06/21 1126 06/06/21 1212  ?BP: (!) 67/46 (!) 88/65 95/60 127/61  ?Pulse:    86  ?Resp:    20  ?Temp:    97.6 ?F (36.4 ?C)  ?TempSrc:    Axillary  ?SpO2:    96%  ?Weight:    47 kg  ?Height:    5\' 6"  (1.676 m)  ? ? ?Intake/Output Summary (Last 24 hours) at 06/06/2021 1242 ?Last data filed at 06/06/2021 0600 ?Gross per 24 hour  ?Intake 520 ml  ?Output 750 ml  ?Net -230 ml  ? ?Filed Weights  ? 06/02/21 0802 06/04/21 0400 06/06/21 1212  ?Weight: 49.5 kg 47 kg 47 kg  ? ? ?Telemetry  ?  ?SVT vs AF 125 - Personally Reviewed ? ?ECG  ?  ?AF 2:1 rate 126 - Personally Reviewed ? ?Physical Exam  ? ?GEN: acutely ill   ?Neck: no JVD ?Cardiac: regular rhythm tachycardic, no murmur ?Respiratory: diminished, shallow resp ?GI: Soft, nontender, non-distended  ?MS: No edema; No deformity. ?Neuro:  Nonfocal   ?Psych: Normal affect  ? ?Labs  ?  ?Chemistry ?Recent Labs  ?Lab 06/04/21 ?0225 06/05/21 ?0263 06/06/21 ?0333  ?NA 134* 136 137  ?K 3.6 3.7 3.6  ?CL 95* 93* 92*  ?CO2 34* 35* 38*  ?GLUCOSE 85 93 91  ?BUN 11 15 18   ?CREATININE 0.45 0.51 0.60  ?CALCIUM 8.0* 8.3* 8.7*  ?PROT 5.1* 5.6* 5.8*  ?ALBUMIN 2.7* 3.0* 2.9*  ?AST 12* 11* 14*  ?ALT 8 9 10   ?ALKPHOS 60 66 70  ?BILITOT <0.1* 0.1* 0.4  ?GFRNONAA >60 >60 >60  ?ANIONGAP 5 8 7   ?  ? ?Hematology ?Recent Labs  ?Lab 06/04/21 ?0225 06/05/21 ?7858 06/06/21 ?0333  ?WBC 6.0 7.9 6.2  ?RBC 2.87* 3.03* 3.25*  ?HGB 8.5* 9.0* 9.4*  ?HCT 26.6* 28.7* 30.5*  ?MCV 92.7 94.7 93.8  ?MCH 29.6 29.7 28.9  ?MCHC 32.0 31.4 30.8  ?RDW 14.5 14.5 14.4  ?PLT 419* 423* 406*  ? ? ?Cardiac EnzymesNo results for input(s): TROPONINI in the last 168 hours. No results for input(s): TROPIPOC in the last 168 hours.  ? ?BNP ?Recent Labs  ?Lab 06/01/21 ?2148 06/05/21 ?0252  ?BNP 249.3* 79.8  ?  ? ?  DDimer No results for input(s): DDIMER in the last 168 hours.  ? ?Radiology  ?  ?CT Angio Chest Pulmonary Embolism (PE) W or WO Contrast ? ?Result Date: 06/02/2021 ?CLINICAL DATA:  Pulmonary embolism suspected, high probability. Shortness of breath. EXAM: CT ANGIOGRAPHY CHEST WITH CONTRAST TECHNIQUE: Multidetector CT imaging of the chest was performed using the standard protocol during bolus administration of intravenous contrast. Multiplanar CT image reconstructions and MIPs were obtained to evaluate the vascular anatomy. RADIATION DOSE REDUCTION: This exam was performed according to the departmental dose-optimization program which includes automated exposure control, adjustment of the mA and/or kV according to patient size and/or use of iterative reconstruction technique. CONTRAST:  56mL OMNIPAQUE IOHEXOL 350 MG/ML SOLN COMPARISON:  09/12/2020. FINDINGS: Cardiovascular: The heart is normal in size. Scattered coronary artery calcifications are noted. There is atherosclerotic calcification of the aorta without  evidence of aneurysm. The pulmonary trunk is distended which may be associated with underlying pulmonary artery hypertension. No definite pulmonary artery filling defect is identified. Evaluation is limited due to infiltrates and effusions. Mediastinum/Nodes: No enlarged mediastinal, hilar, or axillary lymph nodes. Thyroid gland, trachea, and esophagus demonstrate no significant findings. Lungs/Pleura: There is a moderate pleural effusion on the right no large pleural effusion on the left with associated atelectasis bilaterally. Scattered ground-glass opacities are noted in the lungs bilaterally. No pneumothorax. Upper Abdomen: There is reflux of contrast into the inferior vena cava and hepatic veins. A right renal cyst is noted. Musculoskeletal: No acute fracture. Degenerative changes are present in the thoracic spine. Review of the MIP images confirms the above findings. IMPRESSION: 1. No evidence of pulmonary embolism. 2. Moderate pleural effusion on the right large pleural effusion on the left with associated compressive atelectasis. 3. Ground-glass opacities in the lungs bilaterally, possible edema or infiltrate. 4. Aortic atherosclerosis and coronary artery calcifications. 5. Distended pulmonary trunk may be associated with underlying pulmonary artery hypertension. There is reflux of contrast into the inferior vena cava and hepatic veins suggesting right heart failure. Electronically Signed   By: Brett Fairy M.D.   On: 06/02/2021 00:26  ? ?DG CHEST PORT 1 VIEW ? ?Result Date: 06/04/2021 ?CLINICAL DATA:  COPD.  Hypoxia.  History of lung cancer EXAM: PORTABLE CHEST 1 VIEW COMPARISON:  06/01/2021 FINDINGS: Small right pleural effusion and right lower lobe airspace disease unchanged. Surgical clips overlying the right hilum unchanged Mild to moderate left effusion and left lower lobe airspace disease also unchanged. No significant pulmonary edema. IMPRESSION: Bilateral pleural effusions and bibasilar airspace  disease left greater than right unchanged from the prior study. Negative for edema. Electronically Signed   By: Franchot Gallo M.D.   On: 06/04/2021 12:52  ? ?DG Chest Port 1 View ? ?Result Date: 06/01/2021 ?CLINICAL DATA:  Shortness of breath.  Hypotensive and tachycardic. EXAM: PORTABLE CHEST 1 VIEW COMPARISON:  05/08/2021. FINDINGS: The heart is enlarged the pulmonary vasculature is distended. Atherosclerotic calcification of the aorta is noted. Surgical clips are present at the hilar region on the right. Hazy airspace disease is present in the mid to lower lung fields bilaterally. There is a small right pleural effusion and a moderate left pleural effusion. No pneumothorax. No acute osseous abnormality. IMPRESSION: 1. Cardiomegaly with pulmonary vascular congestion. 2. Hazy airspace disease in the mid to lower lung fields bilaterally, possible edema or infiltrate. 3. Small right pleural effusion and moderate left pleural effusion. Electronically Signed   By: Brett Fairy M.D.   On: 06/01/2021 22:22  ? ?DG Chest [facility]  1 View ? ?Result Date: 05/08/2021 ?CLINICAL DATA:  Shortness of breath with low oxygen saturation. EXAM: PORTABLE CHEST 1 VIEW COMPARISON:  February 27, 2021 FINDINGS: Limited study secondary to patient rotation. Mild to moderate severity left basilar atelectasis and/or infiltrate is seen. A small, predominant stable left pleural effusion is noted. No pneumothorax is identified. The heart size and mediastinal contours are within normal limits. Radiopaque surgical clips are seen overlying the right hilum. There is marked severity calcification of the thoracic aorta. The visualized skeletal structures are unremarkable. IMPRESSION: 1. Mild to moderate severity left basilar atelectasis and/or infiltrate. 2. Small, predominant stable left pleural effusion. Electronically Signed   By: Virgina Norfolk M.D.   On: 05/08/2021 22:36  ? ?ECHOCARDIOGRAM COMPLETE ? ?Result Date: 06/03/2021 ?   ECHOCARDIOGRAM  REPORT   Patient Name:   SYLVAN LAHM Middleton Date of Exam: 06/03/2021 Medical Rec #:  [id number]              Height:       66.0 in Accession #:    [id number]             Weight:       109.1 lb Date of Birth:  [birth year]

## 2021-06-07 DIAGNOSIS — Z515 Encounter for palliative care: Secondary | ICD-10-CM | POA: Diagnosis not present

## 2021-06-07 DIAGNOSIS — Z7189 Other specified counseling: Secondary | ICD-10-CM | POA: Diagnosis not present

## 2021-06-07 DIAGNOSIS — J9601 Acute respiratory failure with hypoxia: Secondary | ICD-10-CM | POA: Diagnosis not present

## 2021-06-07 DIAGNOSIS — I471 Supraventricular tachycardia: Secondary | ICD-10-CM | POA: Diagnosis not present

## 2021-06-07 LAB — CULTURE, BLOOD (ROUTINE X 2)
Culture: NO GROWTH
Culture: NO GROWTH
Special Requests: ADEQUATE
Special Requests: ADEQUATE

## 2021-06-07 LAB — PHOSPHORUS: Phosphorus: 3.6 mg/dL (ref 2.5–4.6)

## 2021-06-07 LAB — CBC WITH DIFFERENTIAL/PLATELET
Abs Immature Granulocytes: 0.02 10*3/uL (ref 0.00–0.07)
Basophils Absolute: 0.1 10*3/uL (ref 0.0–0.1)
Basophils Relative: 1 %
Eosinophils Absolute: 0.4 10*3/uL (ref 0.0–0.5)
Eosinophils Relative: 7 %
HCT: 29.7 % — ABNORMAL LOW (ref 36.0–46.0)
Hemoglobin: 9.3 g/dL — ABNORMAL LOW (ref 12.0–15.0)
Immature Granulocytes: 0 %
Lymphocytes Relative: 27 %
Lymphs Abs: 1.4 10*3/uL (ref 0.7–4.0)
MCH: 29.2 pg (ref 26.0–34.0)
MCHC: 31.3 g/dL (ref 30.0–36.0)
MCV: 93.1 fL (ref 80.0–100.0)
Monocytes Absolute: 0.5 10*3/uL (ref 0.1–1.0)
Monocytes Relative: 10 %
Neutro Abs: 2.8 10*3/uL (ref 1.7–7.7)
Neutrophils Relative %: 55 %
Platelets: 397 10*3/uL (ref 150–400)
RBC: 3.19 MIL/uL — ABNORMAL LOW (ref 3.87–5.11)
RDW: 14.3 % (ref 11.5–15.5)
WBC: 5.1 10*3/uL (ref 4.0–10.5)
nRBC: 0 % (ref 0.0–0.2)

## 2021-06-07 LAB — COMPREHENSIVE METABOLIC PANEL
ALT: 10 U/L (ref 0–44)
AST: 14 U/L — ABNORMAL LOW (ref 15–41)
Albumin: 2.8 g/dL — ABNORMAL LOW (ref 3.5–5.0)
Alkaline Phosphatase: 63 U/L (ref 38–126)
Anion gap: 5 (ref 5–15)
BUN: 20 mg/dL (ref 8–23)
CO2: 37 mmol/L — ABNORMAL HIGH (ref 22–32)
Calcium: 8.3 mg/dL — ABNORMAL LOW (ref 8.9–10.3)
Chloride: 92 mmol/L — ABNORMAL LOW (ref 98–111)
Creatinine, Ser: 0.56 mg/dL (ref 0.44–1.00)
GFR, Estimated: >60 mL/min (ref >60)
Glucose, Bld: 85 mg/dL (ref 70–99)
Potassium: 3.8 mmol/L (ref 3.5–5.1)
Sodium: 134 mmol/L — ABNORMAL LOW (ref 135–145)
Total Bilirubin: 0.1 mg/dL — ABNORMAL LOW (ref 0.3–1.2)
Total Protein: 5.6 g/dL — ABNORMAL LOW (ref 6.5–8.1)

## 2021-06-07 LAB — MAGNESIUM: Magnesium: 2.3 mg/dL (ref 1.7–2.4)

## 2021-06-07 MED ORDER — AMIODARONE LOAD VIA INFUSION
150.0000 mg | Freq: Once | INTRAVENOUS | Status: AC
  Administered 2021-06-07: 150 mg via INTRAVENOUS
  Filled 2021-06-07: qty 83.34

## 2021-06-07 MED ORDER — SODIUM CHLORIDE 0.9 % IV BOLUS
250.0000 mL | Freq: Once | INTRAVENOUS | Status: AC
  Administered 2021-06-07: 250 mL via INTRAVENOUS

## 2021-06-07 NOTE — Progress Notes (Signed)
Patient HQ:[facility] Samantha Fitzgerald      DOB: 04-18-1924      BMW:[id number] ? ? ? ?  ?Palliative Medicine Team ? ? ? ?Subjective: Bedside symptom check. ? ? ?Physical exam: Patient resting with eyes closed at beginning of visit. Patient wakes appropriately when this RN enters room. Patient alert and appropriate, pleasantly conversational. Patient denies any pain or discomfort at this time. No other needs identified per patient. ? ? ?Assessment and plan: Bedside RN Thomasena Edis without any concerns or needs at this time. Understanding and in agreement that we will continue to medically manage for symptoms but will not escalate care. Will continue to follow for any changes or needs.  ? ? ?Thank you for allowing the Palliative Medicine Team to assist in the care of this patient. ?  ?  ?Damian Leavell, MSN, RN ?Palliative Medicine Team ?Team Phone: [phone]  ?This phone is monitored 7a-7p, please reach out to attending physician outside of these hours for urgent needs.   ?

## 2021-06-07 NOTE — Progress Notes (Signed)
?PROGRESS NOTE ? ? ? ?Samantha Fitzgerald  UQJ:[id number] DOB: 05/05/1924 DOA: 06/01/2021 ?PCP: System, Provider Not In  ? ?Brief Narrative: ?88 year old with past medical history significant for COPD chronic respiratory failure with hypoxia on 3 L of oxygen at home, malignant neoplasm of the bronchus and lung, pleural effusion, chronic diastolic heart failure, essential hypertension, PSVT, pulmonary hypertension, sick sinus syndrome, history of DVT who was brought to the ER.  She was complaining of worsening shortness of breath.  Evaluation in the ED patient was found to be in SVT with heart rate in the 140.  CT angio of the chest was negative for PE did show bilateral pleural effusion and groundglass opacity concerning for pneumonia. ? ?Her SVT she received a dose of adenosine, which did not resolve the SVT.  Subsequently she was a started on amiodarone drip.  Subsequently transition to flecainide.  ? ?Cardiology consulted to assist with management of heart failure exacerbation and arrhythmia. ? ?Patient develops hypotension, SBP in the 60--70, HR 130 A fib. She received IV bolus, SBP increased 95--subsequently she converted to sinus and BP increase 120. Sh will be started on IV amiodarone.  ? ? ? ?Assessment & Plan: ?  ?Principal Problem: ?  Acute respiratory failure with hypoxia ([place]) ?Active Problems: ?  Pleural effusion on left ?  COPD (chronic obstructive pulmonary disease) ([place]) ?  Sick sinus syndrome ([place]) ?  Chronic diastolic CHF (congestive heart failure) ([place]) ?  Acute on chronic respiratory failure with hypoxemia ([facility]) ?  History of DVT (deep vein thrombosis) ?  Paroxysmal SVT (supraventricular tachycardia) ([facility]) ?  Acute respiratory failure with hypoxemia ([facility]) ?  Pulmonary hypertension ([place]) ?  Bilateral pleural effusion ?  Protein-calorie malnutrition, severe ([place]) ?  Decubitus ulcer of sacral region, stage 1 ? ? ?1-SVT: ?Per admission note 3/19 on-call cardiology  Dr. Zigmund Daniel recommended IV  adenosine, if not improvement start amiodarone.  ?Patient was started on IV amiodarone, subsequently transition to Flecainide.  ?3/22: Cardiology consulted to assist with arrhythmia. Dr Alba Cory recommend stop flecainide and start amiodarone. Also recommended palliative care consultation.  ?3/23: -Patient develops tachycardia HR 130 A fib--Hypotension, she was SOB. She received IV Bolus. Subsequently BP increased to 95. Patient then sinus converted--SBP increased 120.  ?-started on IV amiodarone.  ?-recurrent episode os SVT and hypotension this am. Received IV fluids and amiodarone bolus.  ?-Family discussed with palliative, no IV pressors or further scalation of care.  ? ?Acute on chronic hypoxic respiratory failure; secondary to bilateral pleural effusion and heart failure exacerbation ?-Patient also completed 5 days of antibiotics ?-Continue with DuoNeb ?-BP low, hold lasix.  ? ?2-COPD exacerbation: Continue with DuoNeb. ? ?3-Bilateral pleural effusion: Received IV Lasix ?X-ray 3/21 persistent bilateral pleural effusion ?Received IV lasix today, change to oral starting tomorrow.  ?Might need to hold lasix if BP is low.  ? ?4-Acute chronic diastolic heart failure: ?Presented with shortness of breath, bilateral pleural effusion elevated BNP ?Change lasix to oral. Hold lasix today due to hypotension.  ?Cardiology consulted ? ?HTN; allow higher  BP due to recurrent SVT with hypotension.  ? ?Pressure Injury 06/02/21 Sacrum Stage 2 -  Partial thickness loss of dermis presenting as a shallow open injury with a red, pink wound bed without slough. stage 2 with non-blanchable redness (Active)  ?06/02/21 0404  ?Location: Sacrum  ?Location Orientation:   ?Staging: Stage 2 -  Partial thickness loss of dermis presenting as a shallow open injury with a red, pink wound bed without  slough.  ?Wound Description (Comments): stage 2 with non-blanchable redness  ?Present on Admission: Yes  ?Dressing Type Foam - Lift dressing to assess  site every shift 06/06/21 2000  ? ? ? ?Nutrition Problem: Severe Malnutrition ?Etiology: chronic illness ? ? ? ?Signs/Symptoms: severe fat depletion, severe muscle depletion ? ? ? ?Interventions: Ensure Enlive (each supplement provides 350kcal and 20 grams of protein), Liberalize Diet ? ?Estimated body mass index is 16.72 kg/m? as calculated from the following: ?  Height as of this encounter: 5\' 6"  (1.676 m). ?  Weight as of this encounter: 47 kg. ? ? ?DVT prophylaxis: Eliquis ?Code Status: DNR ?Family Communication: Daughter at bedside 3/23 ?Disposition Plan:  ?Status is: Inpatient ?Remains inpatient appropriate because: remain in patient for IV lasix.  ? ? ? ?Consultants:  ?Cardiology  ? ?Procedures:  ?None ? ?Antimicrobials:  ? ? ?Subjective: ?Patient this am denies dyspnea or weakness, in SVT and with hypotension.  ? ?Objective: ?Vitals:  ? 06/07/21 0600 06/07/21 0700 06/07/21 0706 06/07/21 0822  ?BP: (!) 125/26 (!) 77/35 (!) 93/38   ?Pulse: 67 (!) 114 (!) 113   ?Resp: 17 (!) 21 (!) 24   ?Temp:    97.9 ?F (36.6 ?C)  ?TempSrc:    Axillary  ?SpO2: 98% 94% 91%   ?Weight:      ?Height:      ? ? ?Intake/Output Summary (Last 24 hours) at 06/07/2021 0831 ?Last data filed at 06/06/2021 2321 ?Gross per 24 hour  ?Intake 593.65 ml  ?Output 650 ml  ?Net -56.35 ml  ? ? ?Filed Weights  ? 06/02/21 0802 06/04/21 0400 06/06/21 1212  ?Weight: 49.5 kg 47 kg 47 kg  ? ? ?Examination: ? ?General exam: chronic ill appearing ?Respiratory system: BL crackles.  ?Cardiovascular system:  S 1, S 2 RRR ?Gastrointestinal system: BS present, soft, nt ?Central nervous system: alert, follows command ?Extremities: No edema ? ? ?Data Reviewed: I have personally reviewed following labs and imaging studies ? ?CBC: ?Recent Labs  ?Lab 06/03/21 ?0302 06/04/21 ?0225 06/05/21 ?7253 06/06/21 ?6644 06/07/21 ?0250  ?WBC 6.5 6.0 7.9 6.2 5.1  ?NEUTROABS 4.4 3.8 5.4 3.9 2.8  ?HGB 8.8* 8.5* 9.0* 9.4* 9.3*  ?HCT 27.5* 26.6* 28.7* 30.5* 29.7*  ?MCV 91.1 92.7 94.7  93.8 93.1  ?PLT 438* 419* 423* 406* 397  ? ? ?Basic Metabolic Panel: ?Recent Labs  ?Lab 06/03/21 ?0302 06/04/21 ?0225 06/05/21 ?0347 06/06/21 ?4259 06/07/21 ?0250  ?NA 134* 134* 136 137 134*  ?K 3.2* 3.6 3.7 3.6 3.8  ?CL 93* 95* 93* 92* 92*  ?CO2 34* 34* 35* 38* 37*  ?GLUCOSE 85 85 93 91 85  ?BUN 9 11 15 18 20   ?CREATININE 0.54 0.45 0.51 0.60 0.56  ?CALCIUM 8.3* 8.0* 8.3* 8.7* 8.3*  ?MG 1.7 2.0 2.0 1.8 2.3  ?PHOS 3.4 3.2 3.0 3.2 3.6  ? ? ?GFR: ?Estimated Creatinine Clearance: 31.9 mL/min (by C-G formula based on SCr of 0.56 mg/dL). ?Liver Function Tests: ?Recent Labs  ?Lab 06/03/21 ?0302 06/04/21 ?0225 06/05/21 ?5638 06/06/21 ?7564 06/07/21 ?0250  ?AST 12* 12* 11* 14* 14*  ?ALT 10 8 9 10 10   ?ALKPHOS 67 60 66 70 63  ?BILITOT 0.1* <0.1* 0.1* 0.4 0.1*  ?PROT 5.8* 5.1* 5.6* 5.8* 5.6*  ?ALBUMIN 3.2* 2.7* 3.0* 2.9* 2.8*  ? ? ?No results for input(s): LIPASE, AMYLASE in the last 168 hours. ?No results for input(s): AMMONIA in the last 168 hours. ?Coagulation Profile: ?No results for input(s): INR, PROTIME in the last 168  hours. ?Cardiac Enzymes: ?No results for input(s): CKTOTAL, CKMB, CKMBINDEX, TROPONINI in the last 168 hours. ?BNP (last 3 results) ?No results for input(s): PROBNP in the last 8760 hours. ?HbA1C: ?No results for input(s): HGBA1C in the last 72 hours. ?CBG: ?Recent Labs  ?Lab 06/06/21 ?1128  ?GLUCAP 118*  ? ? ?Lipid Profile: ?No results for input(s): CHOL, HDL, LDLCALC, TRIG, CHOLHDL, LDLDIRECT in the last 72 hours. ?Thyroid Function Tests: ?No results for input(s): TSH, T4TOTAL, FREET4, T3FREE, THYROIDAB in the last 72 hours. ?Anemia Panel: ?No results for input(s): VITAMINB12, FOLATE, FERRITIN, TIBC, IRON, RETICCTPCT in the last 72 hours. ?Sepsis Labs: ?Recent Labs  ?Lab 06/01/21 ?2148  ?LATICACIDVEN 1.9  ? ? ? ?Recent Results (from the past 240 hour(s))  ?Culture, blood (Routine X 2) w Reflex to ID Panel     Status: None  ? Collection Time: 06/01/21  9:07 PM  ? Specimen: BLOOD  ?Result Value Ref Range  Status  ? Specimen Description   Final  ?  BLOOD BLOOD LEFT FOREARM ?Performed at [facility], [place] [address], [place], [place] [address] ?  ? Special Requests   Final  ?  BOTTLES DRAWN AER

## 2021-06-07 NOTE — Plan of Care (Signed)

## 2021-06-07 NOTE — Progress Notes (Signed)
PT Cancellation Note ? ?Patient Details ?Name: Laurali Goddard ?MRN: [id number] ?DOB: 05-07-1924 ? ? ?Cancelled Treatment:    Reason Eval/Treat Not Completed: Medical issues which prohibited therapy (pt hypotensive 70/40 mmHg. Will follow up at later date/time as schedule allows and pt able.) ? ? ?Gwynneth Albright PT, DPT ?Acute Rehabilitation Services ?Office [phone] ?Pager [phone] ? ?

## 2021-06-07 NOTE — Progress Notes (Addendum)
? ?Progress Note ? ?Patient Name: Samantha Fitzgerald ?Date of Encounter: 06/07/2021 ? ?[facility] Cardiologist: new to [facility] - previously seen by [facility]   ? ?Subjective  ? ?Went into SVT vs aflutter this morning around 6:30AM. Denies any chest pain or worsening dyspnea.  ? ?Inpatient Medications  ?  ?Scheduled Meds: ? apixaban  5 mg Oral Q12H  ? Chlorhexidine Gluconate Cloth  6 each Topical Q0600  ? COVID-19 mRNA bivalent vaccine (Pfizer)  0.3 mL Intramuscular Once  ? digoxin  0.125 mg Oral Daily  ? feeding supplement  237 mL Oral BID BM  ? fluticasone furoate-vilanterol  1 puff Inhalation Daily  ? And  ? umeclidinium bromide  1 puff Inhalation Daily  ? furosemide  20 mg Oral Daily  ? mouth rinse  15 mL Mouth Rinse BID  ? sertraline  25 mg Oral Daily  ? ?Continuous Infusions: ? amiodarone 30 mg/hr (06/07/21 0439)  ? ?PRN Meds: ?acetaminophen **OR** acetaminophen, fentaNYL (SUBLIMAZE) injection, hydrALAZINE, ipratropium-albuterol, labetalol, LORazepam  ? ?Vital Signs  ?  ?Vitals:  ? 06/07/21 0500 06/07/21 0600 06/07/21 0700 06/07/21 0706  ?BP: (!) 170/52 (!) 125/26 (!) 77/35 (!) 93/38  ?Pulse: 63 67 (!) 114 (!) 113  ?Resp: (!) 21 17 (!) 21 (!) 24  ?Temp:      ?TempSrc:      ?SpO2: 99% 98% 94% 91%  ?Weight:      ?Height:      ? ? ?Intake/Output Summary (Last 24 hours) at 06/07/2021 0759 ?Last data filed at 06/06/2021 2321 ?Gross per 24 hour  ?Intake 593.65 ml  ?Output 650 ml  ?Net -56.35 ml  ? ? ?  06/06/2021  ? 12:12 PM 06/04/2021  ?  4:00 AM 06/02/2021  ?  8:02 AM  ?Last 3 Weights  ?Weight (lbs) 103 lb 9.9 oz 103 lb 9.9 oz 109 lb 2 oz  ?Weight (kg) 47 kg 47 kg 49.5 kg  ?   ? ?Telemetry  ?  ?NSR went into narrow complex tachycardia around 6:30AM. - Personally Reviewed ? ?ECG  ?  ?Narrow complex tachycardia without obvious p wave - Personally Reviewed ? ?Physical Exam  ? ?GEN: No acute distress.   ?Neck: No JVD ?Cardiac: tachycardic, no murmurs, rubs, or gallops.  ?Respiratory: Clear to auscultation  bilaterally. ?GI: Soft, nontender, non-distended  ?MS: No edema; No deformity. ?Neuro:  Nonfocal  ?Psych: Normal affect  ? ?Labs  ?  ?High Sensitivity Troponin:   ?Recent Labs  ?Lab 05/08/21 ?2220 05/09/21 ?0053 06/02/21 ?0734 06/02/21 ?0941  ?TROPONINIHS 15 15 34* 28*  ?   ?Chemistry ?Recent Labs  ?Lab 06/05/21 ?0252 06/06/21 ?0333 06/07/21 ?0250  ?NA 136 137 134*  ?K 3.7 3.6 3.8  ?CL 93* 92* 92*  ?CO2 35* 38* 37*  ?GLUCOSE 93 91 85  ?BUN 15 18 20   ?CREATININE 0.51 0.60 0.56  ?CALCIUM 8.3* 8.7* 8.3*  ?MG 2.0 1.8 2.3  ?PROT 5.6* 5.8* 5.6*  ?ALBUMIN 3.0* 2.9* 2.8*  ?AST 11* 14* 14*  ?ALT 9 10 10   ?ALKPHOS 66 70 63  ?BILITOT 0.1* 0.4 0.1*  ?GFRNONAA >60 >60 >60  ?ANIONGAP 8 7 5   ?  ?Lipids No results for input(s): CHOL, TRIG, HDL, LABVLDL, LDLCALC, CHOLHDL in the last 168 hours.  ?Hematology ?Recent Labs  ?Lab 06/05/21 ?0252 06/06/21 ?0333 06/07/21 ?0250  ?WBC 7.9 6.2 5.1  ?RBC 3.03* 3.25* 3.19*  ?HGB 9.0* 9.4* 9.3*  ?HCT 28.7* 30.5* 29.7*  ?MCV 94.7 93.8 93.1  ?MCH 29.7  28.9 29.2  ?MCHC 31.4 30.8 31.3  ?RDW 14.5 14.4 14.3  ?PLT 423* 406* 397  ? ?Thyroid  ?Recent Labs  ?Lab 06/02/21 ?0050  ?TSH 3.692  ?  ?BNP ?Recent Labs  ?Lab 06/01/21 ?2148 06/05/21 ?0252  ?BNP 249.3* 79.8  ?  ?DDimer No results for input(s): DDIMER in the last 168 hours.  ? ?Radiology  ?  ?No results found. ? ?Cardiac Studies  ? ?Echo 06/03/2021 ? 1. Left ventricular ejection fraction, by estimation, is 60 to 65%. The  ?left ventricle has normal function. The left ventricle has no regional  ?wall motion abnormalities. Left ventricular diastolic parameters are  ?indeterminate.  ? 2. Right ventricular systolic function is mildly reduced. The right  ?ventricular size is mildly enlarged.  ? 3. The mitral valve is normal in structure. No evidence of mitral valve  ?regurgitation. No evidence of mitral stenosis.  ? 4. The aortic valve is normal in structure. Aortic valve regurgitation is  ?not visualized. No aortic stenosis is present.  ? 5. The inferior  vena cava is normal in size with greater than 50%  ?respiratory variability, suggesting right atrial pressure of 3 mmHg.  ? ?Patient Profile  ?   ?88 y.o. female with PMH of COPD, chronic respiratory failure on 24/7 O2, former tobacco use, paroxysmal SVT, PAF, tachybrady syndrome, hypertension with significant orthostatic hypotension on midodrine, hypothyroidism, DVT and h/o lung CA who was seen  for the evaluation of acute diastolic heart failure in the setting of tachypalpitations at the request of Dr. Tyrell Antonio ? ?Assessment & Plan  ?  ?Acute diastolic CHF ? - occurred in the setting of SVT. Euvolemic, back on home dose of lasix 20mg  daily.  ? ?Acute on chronic respiratory failure: treated for both PNA and acute CHF. On 24/7 O2 at home. H/o significant COPD and tobacco abuse. No longer smoker ? ?H/o paroxysmal SVT ? - while in SVT, patient appears to be hypotensive and symptomatic. She spontaneously converted on 3/23 in front of cardiology MD.  ? - patient was initially placed on IV amiodarone, switched to higher dose of flecainide PO after 24 hours. Due to decreased effectiveness, flecainide switched to PO amiodarone on 3/22. After the recurrent symptomatic SVT on 3/23, she was switched back to IV amiodarone.  ? - recurrent SVT this morning around 6:30AM. BP low in the 80s. Patient however is not symptomatic. Patient is DNR. Family prefers to pressors. Will give a bolus of 150mg  IV amiodarone. Also give 250mg  IVF. Hopefully patient would convert out of SVT just like yesterday.  ? ?H/o PAF: digoxin, eliquis. No longer on beta blocker due to history of significant orthostatic hypotension ? ?COPD ? ?HTN with significant orhostatic hypotension: not on BB ? ?Hyperlipidemia ? ?H/o DVT ? ?H/o Lung CA ? ?   ? ?For questions or updates, please contact [place] ?Please consult [email or web address] for contact info under  ? ?  ?   ?Signed, ?Almyra Deforest, [place]  ?06/07/2021, 7:59 AM   ? ?Patient seen and examined with Almyra Deforest PA.   Agree as above, with the following exceptions and changes as noted below. On my exam back in SR, and normotensive. Gen: cachectic, CV: RRR, no murmurs, Lungs: clear, Abd: soft, Extrem: no edema, Neuro/Psych:nonfocal All available labs, radiology testing, previous records reviewed. Continue IV amiodarone. She will continue to have paroxysms of narrow complex tachycardia I suspect. Can consider IV amio bolus if unstable.  ? ?Cardiology will follow as needed over the weekend.  ? ?  Elouise Munroe, MD ?06/07/21 3:51 PM ? ?

## 2021-06-08 DIAGNOSIS — I5032 Chronic diastolic (congestive) heart failure: Secondary | ICD-10-CM | POA: Diagnosis not present

## 2021-06-08 DIAGNOSIS — Z515 Encounter for palliative care: Secondary | ICD-10-CM | POA: Diagnosis not present

## 2021-06-08 DIAGNOSIS — I471 Supraventricular tachycardia: Secondary | ICD-10-CM | POA: Diagnosis not present

## 2021-06-08 DIAGNOSIS — J9601 Acute respiratory failure with hypoxia: Secondary | ICD-10-CM | POA: Diagnosis not present

## 2021-06-08 LAB — MAGNESIUM: Magnesium: 2 mg/dL (ref 1.7–2.4)

## 2021-06-08 LAB — PHOSPHORUS: Phosphorus: 3.2 mg/dL (ref 2.5–4.6)

## 2021-06-08 NOTE — Progress Notes (Signed)
? ?                                                                                                                                                     ?                                                   ?Daily Progress Note  ? ?Patient Name: Samantha Fitzgerald       Date: 06/08/2021 ?DOB: [birth year]  Age: 88 y.o. MRN#: [id number] ?Attending Physician: Elmarie Shiley, MD ?Primary Care Physician: System, Provider Not In ?Admit Date: 06/01/2021 ? ?Reason for Consultation/Follow-up: Establishing goals of care ? ?Subjective: ?I saw and examined Samantha Fitzgerald today.  She was sitting in bed in no distress time of my encounter.  She reports she is feeling a little bit better today and denies any pain or shortness of breath.  When asked if there is anything she needs she replied, "a brownie if you find one." ? ?I called and was able to reach her daughter, Samantha Fitzgerald, via phone.  We discussed the period of SVT she had this morning that improved with another bolus of amiodarone.  Reviewed that overall we are limited in options for treatment and family understands this. ? ?We discussed plan to continue to see how she does over the next day or 2 by continuing with current care plan without escalation while also concurrently focusing on symptoms and giving medications as needed to ensure that she is as comfortable as possible regardless of concern for side effects such as hypotension. ? ?Questions and concerns addressed.   PMT will continue to support holistically. ?  ?Length of Stay: 7 ? ?Current Medications: ?Scheduled Meds:  ? apixaban  5 mg Oral Q12H  ? Chlorhexidine Gluconate Cloth  6 each Topical Q0600  ? COVID-19 mRNA bivalent vaccine (Pfizer)  0.3 mL Intramuscular Once  ? digoxin  0.125 mg Oral Daily  ? feeding supplement  237 mL Oral BID BM  ? fluticasone furoate-vilanterol  1 puff Inhalation Daily  ? And  ? umeclidinium bromide  1 puff Inhalation Daily  ? furosemide  20 mg Oral Daily  ? mouth rinse  15 mL Mouth Rinse  BID  ? sertraline  25 mg Oral Daily  ? ? ?Continuous Infusions: ? amiodarone 30 mg/hr (06/08/21 0600)  ? ? ?PRN Meds: ?acetaminophen **OR** acetaminophen, fentaNYL (SUBLIMAZE) injection, ipratropium-albuterol, LORazepam ? ?Physical Exam      ?General: Frail  ?Heart: Irregular and tachycardic  ?lungs: Fair air movement, clear ?Abdomen: Soft, nontender, nondistended, positive bowel sounds.   ?Ext: No significant edema ?Skin: Warm and dry ?Neuro: Grossly intact, nonfocal.  Pleasant and conversational but  does not have full insight into her overall condition. ?    ? ?Vital Signs: BP (!) 119/39   Pulse (!) 57   Temp (!) 97.3 ?F (36.3 ?C) (Axillary)   Resp 16   Ht 5\' 6"  (1.676 m)   Wt 49.1 kg   SpO2 93%   BMI 17.47 kg/m?  ?SpO2: SpO2: 93 % ?O2 Device: O2 Device: Nasal Cannula ?O2 Flow Rate: O2 Flow Rate (L/min): 3 L/min ? ?Intake/output summary:  ?Intake/Output Summary (Last 24 hours) at 06/08/2021 0854 ?Last data filed at 06/08/2021 4098 ?Gross per 24 hour  ?Intake 738.54 ml  ?Output 400 ml  ?Net 338.54 ml  ? ? ?LBM: Last BM Date : 06/06/21 ?Baseline Weight: Weight: 54.4 kg ?Most recent weight: Weight: 49.1 kg ? ?     ?Palliative Assessment/Data: ? ? ? ?Flowsheet Rows   ? ?Flowsheet Row Most Recent Value  ?Intake Tab   ?Referral Department Hospitalist  ?Unit at Time of Referral ICU  ?Palliative Care Primary Diagnosis Cardiac  ?Date Notified 06/05/21  ?Palliative Care Type New Palliative care  ?Reason for referral Clarify Goals of Care  ?Date of Admission 06/01/21  ?Date first seen by Palliative Care 06/05/21  ?# of days Palliative referral response time 0 Day(s)  ?# of days IP prior to Palliative referral 4  ?Clinical Assessment   ?Palliative Performance Scale Score 40%  ?Psychosocial & Spiritual Assessment   ?Palliative Care Outcomes   ?Patient/Family meeting held? Yes  ?Who was at the meeting? patient  ? ?  ? ? ?Patient Active Problem List  ? Diagnosis Date Noted  ? Decubitus ulcer of sacral region, stage 1  06/04/2021  ? Paroxysmal SVT (supraventricular tachycardia) ([place]) 06/02/2021  ? Acute respiratory failure with hypoxemia ([place]) 06/02/2021  ? Pulmonary hypertension ([place]) 06/02/2021  ? Bilateral pleural effusion 06/02/2021  ? Protein-calorie malnutrition, severe ([place]) 06/02/2021  ? Acute respiratory failure with hypoxia ([place]) 06/01/2021  ? Acute on chronic respiratory failure with hypoxia ([place]) 05/08/2021  ? COPD with acute exacerbation ([place]) 05/08/2021  ? History of DVT (deep vein thrombosis) 05/08/2021  ? Pressure injury of skin 02/27/2021  ? Acute on chronic respiratory failure with hypoxemia ([place]) 02/26/2021  ? Bacteremia   ? Palliative care by specialist   ? Goals of care, counseling/discussion   ? General weakness   ? Sepsis ([place]) 12/03/2020  ? COVID-19 virus infection 12/03/2020  ? Acute lower UTI 12/03/2020  ? Cellulitis 12/03/2020  ? Chronic diastolic CHF (congestive heart failure) ([place]) 12/03/2020  ? Hypoalbuminemia 12/03/2020  ? AKI (acute kidney injury) ([place]) 12/03/2020  ? Pleural effusion on left 09/12/2020  ? COPD (chronic obstructive pulmonary disease) ([place]) 09/12/2020  ? Sick sinus syndrome ([place]) 09/12/2020  ? Hypoxia 09/12/2020  ? ? ?Palliative Care Assessment & Plan  ? ?Patient Profile: ?88 y.o. female  with past medical history of COPD, lung, pleural effusions, chronic diastolic heart failure, essential hypertension, PSVT, hypertension, syndrome, admitted on 06/01/2021 with acute on chronic hypoxic respiratory failure secondary to heart failure, COPD exacerbation, bilateral pleural effusions. ? ?Palliative consult for goals of care. ? ?Recommendations/Plan: ?DNR/DNI ?Continue current care without escalation.  We discussed specifically avoiding pressor support in the event of further decompensation and family is in agreement.  Plan will be to continue with gentle medical interventions while also concurrently ensuring that she has medications as needed for any symptoms.  Family understands she is at  high risk for further decompensation and primary goal in this instance would be to ensure  she does not suffer. ?If she stabilizes further (which she appears to be doing on reexamination in ICU) we will continue conversation regarding long-term goals of care based upon how she does through the weekend. ? ? ?Code Status: ? ?  ?Code Status Orders  ?(From admission, onward)  ?  ? ? ?  ? ?  Start     Ordered  ? 06/02/21 0045  Do not attempt resuscitation (DNR)  Continuous       ?Question Answer Comment  ?In the event of cardiac or respiratory ARREST Do not call a ?code blue?   ?In the event of cardiac or respiratory ARREST Do not perform Intubation, CPR, defibrillation or ACLS   ?In the event of cardiac or respiratory ARREST Use medication by any route, position, wound care, and other measures to relive pain and suffering. May use oxygen, suction and manual treatment of airway obstruction as needed for comfort.   ?  ? 06/02/21 0046  ? ?  ?  ? ?  ? ?Code Status History   ? ? Date Active Date Inactive Code Status Order ID Comments User Context  ? 06/02/2021 0006 06/02/2021 0046 DNR [id number]  Rise Patience, MD ED  ? 05/08/2021 2338 05/10/2021 2334 DNR [id number]  Orene Desanctis, DO ED  ? 02/27/2021 0731 03/02/2021 2039 DNR [id number]  [place] Eric J, DO Inpatient  ? 02/26/2021 1805 02/27/2021 0731 DNR [id number]  Orvis Brill, MD ED  ? 12/03/2020 2056 12/14/2020 1539 DNR [id number]  Toy Baker, MD ED  ? 12/03/2020 2008 12/03/2020 2056 DNR [id number]  Toy Baker, MD ED  ? 09/12/2020 2244 09/17/2020 1845 Full Code [id number]  Chotiner, Yevonne Aline, MD Inpatient  ? ?  ? ?Advance Directive Documentation   ? ?Flowsheet Row Most Recent Value  ?Type of Advance Directive Out of facility DNR (pink MOST or yellow form)  ?Pre-existing out of facility DNR order (yellow form or pink MOST form) Pink Most/Yellow Form available - Physician notified to receive inpatient order  ?"MOST" Form in Place? --  ? ?   ? ? ?Prognosis: ?Guarded ? ?Discharge Planning: ?To Be Determined ? ?Care plan was discussed with patient, Dr. Tyrell Antonio, Dr. Margaretann Loveless, patient's 2 daughters ? ?Thank you for allowing the Palliative Medicine Team to assist in the care of

## 2021-06-08 NOTE — Progress Notes (Signed)
Occupational Therapy Treatment ?Patient Details ?Name: Samantha Fitzgerald ?MRN: [id number] ?DOB: 19-Sep-1924 ?Today's Date: 06/08/2021 ? ? ?History of present illness patient presented to the hospital with shortness of breath. patient was found to haveacute respiratory failure with hypoxia and COPD exacerbation FBX:UXYB, chronic respiratory failure with hypoxia on 3 L O2 via [place], malignant neoplasm of bronchus and lung, Pleural Effusion, chronic diastolic CHF, essential HTN, PSVT, pulmonary HTN, Sick Sinus Syndrome, Hx  DVT ?  ?OT comments ? Patient agreeable to therapy and co-tx by PT/OT today for safety and due to patient's poor tolerance.Treatment focused on activity tolerance and toileting. Patient mod assist to transfer to side of bed and required mod x 2 to stand. Patient found to be incontinent of BM. Patient total assist for pericare and needed physical assistance to maintain standing. Patient mod x 2 to take steps to recliner with walker. Patient reported dizziness, nausea and trouble breathing with activity. Patient's o2 sat dropped to 79% on 3 L after transfer and oxygen switched back to wall and recovered to 90%. She reports difficulty breathing but exhibits minimal inhalation movement patient cued to deep breathe. Improved ability to participate today compared to evaluation. Continue POC.  ? ?Recommendations for follow up therapy are one component of a multi-disciplinary discharge planning process, led by the attending physician.  Recommendations may be updated based on patient status, additional functional criteria and insurance authorization. ?   ?Follow Up Recommendations ? Skilled nursing-short term rehab (<3 hours/day)  ?  ?Assistance Recommended at Discharge Frequent or constant Supervision/Assistance  ?Patient can return home with the following ? Two people to help with walking and/or transfers;Two people to help with bathing/dressing/bathroom;Help with stairs or ramp for entrance;Direct  supervision/assist for medications management;Assist for transportation;Direct supervision/assist for financial management;Assistance with cooking/housework;Assistance with feeding ?  ?Equipment Recommendations ? None recommended by OT  ?  ?Recommendations for Other Services   ? ?  ?Precautions / Restrictions Precautions ?Precautions: Fall ?Precaution Comments: hx of dizziness/nausea with sitting up ?Restrictions ?Weight Bearing Restrictions: No  ? ? ?  ? ?Mobility Bed Mobility ?  ?  ?  ?  ?  ?  ?  ?  ?  ? ?Transfers ?  ?  ?  ?  ?  ?  ?  ?  ?  ?  ?  ?  ?Balance Overall balance assessment: Needs assistance ?Sitting-balance support: No upper extremity supported ?Sitting balance-Leahy Scale: Poor ?  ?Postural control: Posterior lean ?Standing balance support: Bilateral upper extremity supported, Reliant on assistive device for balance ?Standing balance-Leahy Scale: Poor ?  ?  ?  ?  ?  ?  ?  ?  ?  ?  ?  ?  ?   ? ?ADL either performed or assessed with clinical judgement  ? ?ADL Overall ADL's : Needs assistance/impaired ?  ?  ?  ?  ?  ?  ?  ?  ?  ?  ?  ?  ?Toilet Transfer: +2 for physical assistance;+2 for safety/equipment;Total assistance ?Toilet Transfer Details (indicate cue type and reason): Mod x 2 for rising for pericare ?Toileting- Clothing Manipulation and Hygiene: Total assistance;Sit to/from stand;+2 for physical assistance ?Toileting - Clothing Manipulation Details (indicate cue type and reason): total assistance requires due to patient's poor balance and activity tolerance. Required assistance to maintain standing and patient holding on to walker while second person performed pericare. ?  ?  ?  ?General ADL Comments: Mod assist to transfer to side of bed with increased  time. Mod x 2 to power up and stand. Total assist for pericare at edge of bed. Poor activity tolerance. O2 sat dropped to 79% on 3 L [place] after transfer to chair and switching oxygen back to wall. ?  ? ?Extremity/Trunk Assessment   ?  ?  ?  ?  ?   ? ?Vision Patient Visual Report: No change from baseline ?Additional Comments: R eye exotropia ?  ?Perception   ?  ?Praxis   ?  ? ?Cognition Arousal/Alertness: Awake/alert ?Behavior During Therapy: [facility] for tasks assessed/performed ?Overall Cognitive Status: History of cognitive impairments - at baseline ?  ?  ?  ?  ?  ?  ?  ?  ?  ?  ?  ?  ?  ?  ?  ?  ?  ?  ?  ?   ?Exercises   ? ?  ?Shoulder Instructions   ? ? ?  ?General Comments    ? ? ?Pertinent Vitals/ Pain       Pain Assessment ?Pain Assessment: No/denies pain ? ?Home Living   ?  ?  ?  ?  ?  ?  ?  ?  ?  ?  ?  ?  ?  ?  ?  ?  ?  ?  ? ?  ?Prior Functioning/Environment    ?  ?  ?  ?   ? ?Frequency ? Min 2X/week  ? ? ? ? ?  ?Progress Toward Goals ? ?OT Goals(current goals can now be found in the care plan section) ? Progress towards OT goals: Progressing toward goals ? ?Acute Rehab OT Goals ?Patient Stated Goal: feel better ?OT Goal Formulation: With patient/family ?Time For Goal Achievement: 06/19/21 ?Potential to Achieve Goals: Good  ?Plan Discharge plan remains appropriate   ? ?Co-evaluation ? ? ? PT/OT/SLP Co-Evaluation/Treatment: Yes ?Reason for Co-Treatment: For patient/therapist safety ?PT goals addressed during session: Mobility/safety with mobility ?OT goals addressed during session: ADL's and self-care ?  ? ?  ?AM-PAC OT "6 Clicks" Daily Activity     ?Outcome Measure ? ? Help from another person eating meals?: A Little ?Help from another person taking care of personal grooming?: A Little ?Help from another person toileting, which includes using toliet, bedpan, or urinal?: Total ?Help from another person bathing (including washing, rinsing, drying)?: A Lot ?Help from another person to put on and taking off regular upper body clothing?: A Lot ?Help from another person to put on and taking off regular lower body clothing?: Total ?6 Click Score: 12 ? ?  ?End of Session Equipment Utilized During Treatment: Rolling walker (2 wheels) ? ?OT Visit Diagnosis:  Unsteadiness on feet (R26.81);Other abnormalities of gait and mobility (R26.89);Muscle weakness (generalized) (M62.81) ?  ?Activity Tolerance Patient limited by fatigue ?  ?Patient Left in chair;with call bell/phone within reach;with chair alarm set ?  ?Nurse Communication Mobility status ?  ? ?   ? ?Time: 4076-8088 ?OT Time Calculation (min): 24 min ? ?Charges: OT General Charges ?$OT Visit: 1 Visit ?OT Treatments ?$Self Care/Home Management : 8-22 mins ? ?Damaria Stofko, OTR/L ?Acute Care Rehab Services  ?Office [phone] ?Pager: [phone]  ? ?Tyan Dy L Vernel Donlan ?06/08/2021, 1:04 PM ?

## 2021-06-08 NOTE — Progress Notes (Addendum)
?PROGRESS NOTE ? ? ? ?Samantha Fitzgerald  KNL:[id number] DOB: April 22, 1924 DOA: 06/01/2021 ?PCP: System, Provider Not In  ? ?Brief Narrative: ?88 year old with past medical history significant for COPD chronic respiratory failure with hypoxia on 3 L of oxygen at home, malignant neoplasm of the bronchus and lung, pleural effusion, chronic diastolic heart failure, essential hypertension, PSVT, pulmonary hypertension, sick sinus syndrome, history of DVT who was brought to the ER.  She was complaining of worsening shortness of breath.  Evaluation in the ED patient was found to be in SVT with heart rate in the 140.  CT angio of the chest was negative for PE did show bilateral pleural effusion and groundglass opacity concerning for pneumonia. ? ?For SVT she received a dose of adenosine, which did not resolve the SVT.  Subsequently she was a started on amiodarone drip.  Subsequently transition to flecainide.  ? ?Cardiology consulted to assist with management of heart failure exacerbation and arrhythmia. ? ?Patient develops hypotension, SBP in the 60--70, HR 130 A fib. She received IV bolus, SBP increased 95--subsequently she converted to sinus and BP increase 120. Sh will be started on IV amiodarone.  ? ? ? ?Assessment & Plan: ?  ?Principal Problem: ?  Acute respiratory failure with hypoxia ([place]) ?Active Problems: ?  Pleural effusion on left ?  COPD (chronic obstructive pulmonary disease) ([place]) ?  Sick sinus syndrome ([place]) ?  Chronic diastolic CHF (congestive heart failure) ([place]) ?  Acute on chronic respiratory failure with hypoxemia (H[facility]) ?  History of DVT (deep vein thrombosis) ?  Paroxysmal SVT (supraventricular tachycardia) ([facility]) ?  Acute respiratory failure with hypoxemia (H[facility]) ?  Pulmonary hypertension ([place]) ?  Bilateral pleural effusion ?  Protein-calorie malnutrition, severe ([place]) ?  Decubitus ulcer of sacral region, stage 1 ? ? ?1-SVT: ?Per admission note 3/19 on-call cardiology  Dr. Zigmund Daniel recommended IV  adenosine, if not improvement start amiodarone.  ?Patient was started on IV amiodarone, subsequently transition to Flecainide.  ?3/22: Cardiology consulted to assist with arrhythmia. Dr Alba Cory recommend stop flecainide and start amiodarone. Also recommended palliative care consultation.  ?3/23: -Patient develops tachycardia HR 130 A fib--Hypotension, she was SOB. She received IV Bolus. Subsequently BP increased to 95. Patient then sinus converted--SBP increased 120.  ?-Started on IV amiodarone.  ?-Recurrent episode os SVT and hypotension 3/24.  Received IV fluids and amiodarone bolus. With resolution of SVT.  ?-Family discussed with palliative, no IV pressors or further scalation of care.  ? ?Acute on chronic hypoxic respiratory failure; secondary to bilateral pleural effusion and heart failure exacerbation ?-Patient also completed 5 days of antibiotics ?-Continue with DuoNeb ?-resume lasix.  ? ?2-COPD exacerbation: Continue with DuoNeb. ? ?3-Bilateral pleural effusion: Received IV Lasix ?X-ray 3/21 persistent bilateral pleural effusion ?Received IV lasix today, change to oral starting tomorrow.  ?Might need to hold lasix if BP is low.  ? ?4-Acute chronic diastolic heart failure: ?Presented with shortness of breath, bilateral pleural effusion elevated BNP ?Resume oral lasix  ?Cardiology consulted ? ?HTN; allow higher  BP due to recurrent SVT with hypotension.  ? ?Pressure Injury 06/02/21 Sacrum Stage 2 -  Partial thickness loss of dermis presenting as a shallow open injury with a red, pink wound bed without slough. stage 2 with non-blanchable redness (Active)  ?06/02/21 0404  ?Location: Sacrum  ?Location Orientation:   ?Staging: Stage 2 -  Partial thickness loss of dermis presenting as a shallow open injury with a red, pink wound bed without slough.  ?Wound Description (Comments):  stage 2 with non-blanchable redness  ?Present on Admission: Yes  ?Dressing Type Foam - Lift dressing to assess site every shift 06/07/21  2000  ? ? ? ?Nutrition Problem: Severe Malnutrition ?Etiology: chronic illness ? ? ? ?Signs/Symptoms: severe fat depletion, severe muscle depletion ? ? ? ?Interventions: Ensure Enlive (each supplement provides 350kcal and 20 grams of protein), Liberalize Diet ? ?Estimated body mass index is 17.47 kg/m? as calculated from the following: ?  Height as of this encounter: 5\' 6"  (1.676 m). ?  Weight as of this encounter: 49.1 kg. ? ? ?DVT prophylaxis: Eliquis ?Code Status: DNR ?Family Communication: Daughter at bedside 3/25 ?Disposition Plan:  ?Status is: Inpatient ?Remains inpatient appropriate because: remain in patient for IV lasix.  ? ? ? ?Consultants:  ?Cardiology  ? ?Procedures:  ?None ? ?Antimicrobials:  ? ? ?Subjective: ?She is doing ok this am. No episodes of SVT last night. She denies dyspnea.  ?Objective: ?Vitals:  ? 06/08/21 0400 06/08/21 0408 06/08/21 0500 06/08/21 0600  ?BP: (!) 123/42  (!) 132/45 (!) 119/39  ?Pulse: (!) 54  60 (!) 57  ?Resp: 16  20 16   ?Temp: (!) 97.3 ?F (36.3 ?C)     ?TempSrc: Axillary     ?SpO2: 99%  93% 93%  ?Weight:  49.1 kg    ?Height:      ? ? ?Intake/Output Summary (Last 24 hours) at 06/08/2021 0636 ?Last data filed at 06/08/2021 0600 ?Gross per 24 hour  ?Intake 738.54 ml  ?Output 300 ml  ?Net 438.54 ml  ? ? ?Filed Weights  ? 06/04/21 0400 06/06/21 1212 06/08/21 0408  ?Weight: 47 kg 47 kg 49.1 kg  ? ? ?Examination: ? ?General exam: Chronic ill appearing.  ?Respiratory system: BL crackles.  ?Cardiovascular system:  S 1, S 2 RRR ?Gastrointestinal system: BS present, soft, nt ?Central nervous system: Alert, follows command ?Extremities: No edema ? ? ?Data Reviewed: I have personally reviewed following labs and imaging studies ? ?CBC: ?Recent Labs  ?Lab 06/03/21 ?0302 06/04/21 ?0225 06/05/21 ?0258 06/06/21 ?5277 06/07/21 ?0250  ?WBC 6.5 6.0 7.9 6.2 5.1  ?NEUTROABS 4.4 3.8 5.4 3.9 2.8  ?HGB 8.8* 8.5* 9.0* 9.4* 9.3*  ?HCT 27.5* 26.6* 28.7* 30.5* 29.7*  ?MCV 91.1 92.7 94.7 93.8 93.1  ?PLT  438* 419* 423* 406* 397  ? ? ?Basic Metabolic Panel: ?Recent Labs  ?Lab 06/03/21 ?0302 06/04/21 ?0225 06/05/21 ?8242 06/06/21 ?3536 06/07/21 ?1443 06/08/21 ?1540  ?NA 134* 134* 136 137 134*  --   ?K 3.2* 3.6 3.7 3.6 3.8  --   ?CL 93* 95* 93* 92* 92*  --   ?CO2 34* 34* 35* 38* 37*  --   ?GLUCOSE 85 85 93 91 85  --   ?BUN 9 11 15 18 20   --   ?CREATININE 0.54 0.45 0.51 0.60 0.56  --   ?CALCIUM 8.3* 8.0* 8.3* 8.7* 8.3*  --   ?MG 1.7 2.0 2.0 1.8 2.3 2.0  ?PHOS 3.4 3.2 3.0 3.2 3.6 3.2  ? ? ?GFR: ?Estimated Creatinine Clearance: 33.3 mL/min (by C-G formula based on SCr of 0.56 mg/dL). ?Liver Function Tests: ?Recent Labs  ?Lab 06/03/21 ?0302 06/04/21 ?0225 06/05/21 ?0867 06/06/21 ?6195 06/07/21 ?0250  ?AST 12* 12* 11* 14* 14*  ?ALT 10 8 9 10 10   ?ALKPHOS 67 60 66 70 63  ?BILITOT 0.1* <0.1* 0.1* 0.4 0.1*  ?PROT 5.8* 5.1* 5.6* 5.8* 5.6*  ?ALBUMIN 3.2* 2.7* 3.0* 2.9* 2.8*  ? ? ?No results for input(s): LIPASE, AMYLASE in  the last 168 hours. ?No results for input(s): AMMONIA in the last 168 hours. ?Coagulation Profile: ?No results for input(s): INR, PROTIME in the last 168 hours. ?Cardiac Enzymes: ?No results for input(s): CKTOTAL, CKMB, CKMBINDEX, TROPONINI in the last 168 hours. ?BNP (last 3 results) ?No results for input(s): PROBNP in the last 8760 hours. ?HbA1C: ?No results for input(s): HGBA1C in the last 72 hours. ?CBG: ?Recent Labs  ?Lab 06/06/21 ?1128  ?GLUCAP 118*  ? ? ?Lipid Profile: ?No results for input(s): CHOL, HDL, LDLCALC, TRIG, CHOLHDL, LDLDIRECT in the last 72 hours. ?Thyroid Function Tests: ?No results for input(s): TSH, T4TOTAL, FREET4, T3FREE, THYROIDAB in the last 72 hours. ?Anemia Panel: ?No results for input(s): VITAMINB12, FOLATE, FERRITIN, TIBC, IRON, RETICCTPCT in the last 72 hours. ?Sepsis Labs: ?Recent Labs  ?Lab 06/01/21 ?2148  ?LATICACIDVEN 1.9  ? ? ? ?Recent Results (from the past 240 hour(s))  ?Culture, blood (Routine X 2) w Reflex to ID Panel     Status: None  ? Collection Time: 06/01/21  9:07  PM  ? Specimen: BLOOD  ?Result Value Ref Range Status  ? Specimen Description   Final  ?  BLOOD BLOOD LEFT FOREARM ?Performed at [facility], [place] [address] [place], [place] [address] ?  ?

## 2021-06-08 NOTE — Evaluation (Signed)
Physical Therapy Evaluation ?Patient Details ?Name: Samantha Fitzgerald ?MRN: [id number] ?DOB: 1924/12/29 ?Today's Date: 06/08/2021 ? ?History of Present Illness ? patient presented to the hospital with shortness of breath. patient was found to haveacute respiratory failure with hypoxia and COPD exacerbation OJJ:KKXF, chronic respiratory failure with hypoxia on 3 L O2 via [place], malignant neoplasm of bronchus and lung, Pleural Effusion, chronic diastolic CHF, essential HTN, PSVT, pulmonary HTN, Sick Sinus Syndrome, Hx  DVT  ?Clinical Impression ? Pt admitted as above and presenting with functional mobility limitations 2* generalized weakness, limited endurance, balance deficits and questionable safety awareness.  Pt would benefit from follow up rehab at SNF level to maximize IND and safety. ?   ? ?Recommendations for follow up therapy are one component of a multi-disciplinary discharge planning process, led by the attending physician.  Recommendations may be updated based on patient status, additional functional criteria and insurance authorization. ? ?Follow Up Recommendations Skilled nursing-short term rehab (<3 hours/day) ? ?  ?Assistance Recommended at Discharge Frequent or constant Supervision/Assistance  ?Patient can return home with the following ? A lot of help with walking and/or transfers;A lot of help with bathing/dressing/bathroom;Assistance with cooking/housework;Direct supervision/assist for medications management;Direct supervision/assist for financial management;Assist for transportation;Help with stairs or ramp for entrance ? ?  ?Equipment Recommendations None recommended by PT  ?Recommendations for Other Services ?    ?  ?Functional Status Assessment Patient has had a recent decline in their functional status and demonstrates the ability to make significant improvements in function in a reasonable and predictable amount of time.  ? ?  ?Precautions / Restrictions Precautions ?Precautions:  Fall ?Precaution Comments: hx of dizziness/nausea with sitting up ?Restrictions ?Weight Bearing Restrictions: No  ? ?  ? ?Mobility ? Bed Mobility ?Overal bed mobility: Needs Assistance ?Bed Mobility: Supine to Sit ?  ?  ?Supine to sit: Mod assist ?  ?  ?General bed mobility comments: with increased time and physical assist for trunk ?  ? ?Transfers ?Overall transfer level: Needs assistance ?Equipment used: Rolling walker (2 wheels) ?Transfers: Sit to/from Stand, Bed to chair/wheelchair/BSC ?Sit to Stand: Mod assist, +2 physical assistance, +2 safety/equipment ?  ?  ?  ?  ?  ?General transfer comment: cues for LE management and use of UEs to self assist;  Physical assist to bring wt up and fwd and to balance in standing with RW; step-pvt bed to recliner with RW ?  ? ?Ambulation/Gait ?  ?  ?  ?  ?  ?  ?  ?General Gait Details: Step pvt bed to recliner only ? ?Stairs ?  ?  ?  ?  ?  ? ?Wheelchair Mobility ?  ? ?Modified Rankin (Stroke Patients Only) ?  ? ?  ? ?Balance Overall balance assessment: Needs assistance ?Sitting-balance support: No upper extremity supported ?Sitting balance-Leahy Scale: Poor ?  ?Postural control: Posterior lean ?Standing balance support: Bilateral upper extremity supported, Reliant on assistive device for balance ?Standing balance-Leahy Scale: Poor ?  ?  ?  ?  ?  ?  ?  ?  ?  ?  ?  ?  ?   ? ? ? ?Pertinent Vitals/Pain Pain Assessment ?Pain Assessment: No/denies pain ?Pain Intervention(s): Limited activity within patient's tolerance, Monitored during session  ? ? ?Home Living Family/patient expects to be discharged to:: Skilled nursing facility ?  ?  ?  ?  ?  ?  ?  ?  ?  ?Additional Comments: Pt has been at [facility] for rehab.  ?  ?  Prior Function Prior Level of Function : Patient poor historian/Family not available ?  ?  ?  ?  ?  ?  ?  ?  ?  ? ? ?Hand Dominance  ? Dominant Hand: Right ? ?  ?Extremity/Trunk Assessment  ? Upper Extremity Assessment ?Upper Extremity Assessment: Defer to OT  evaluation ?  ? ?Lower Extremity Assessment ?Lower Extremity Assessment: Generalized weakness ?  ? ?Cervical / Trunk Assessment ?Cervical / Trunk Assessment: Kyphotic  ?Communication  ? Communication: HOH  ?Cognition Arousal/Alertness: Awake/alert ?Behavior During Therapy: [facility] for tasks assessed/performed ?Overall Cognitive Status: History of cognitive impairments - at baseline ?  ?  ?  ?  ?  ?  ?  ?  ?  ?  ?  ?  ?  ?  ?  ?  ?  ?  ?  ? ?  ?General Comments   ? ?  ?Exercises    ? ?Assessment/Plan  ?  ?PT Assessment Patient needs continued PT services  ?PT Problem List Decreased strength;Decreased activity tolerance;Decreased balance;Decreased mobility;Decreased cognition;Decreased knowledge of use of DME;Decreased safety awareness ? ?   ?  ?PT Treatment Interventions DME instruction;Gait training;Functional mobility training;Therapeutic activities;Therapeutic exercise;Balance training;Patient/family education   ? ?PT Goals (Current goals can be found in the Care Plan section)  ?Acute Rehab PT Goals ?Patient Stated Goal: No specific goals expressed ?PT Goal Formulation: With patient ?Time For Goal Achievement: 06/21/21 ?Potential to Achieve Goals: Fair ? ?  ?Frequency Min 2X/week ?  ? ? ?Co-evaluation PT/OT/SLP Co-Evaluation/Treatment: Yes ?Reason for Co-Treatment: For patient/therapist safety ?PT goals addressed during session: Mobility/safety with mobility ?OT goals addressed during session: ADL's and self-care ?  ? ? ?  ?AM-PAC PT "6 Clicks" Mobility  ?Outcome Measure Help needed turning from your back to your side while in a flat bed without using bedrails?: A Lot ?Help needed moving from lying on your back to sitting on the side of a flat bed without using bedrails?: A Lot ?Help needed moving to and from a bed to a chair (including a wheelchair)?: A Lot ?Help needed standing up from a chair using your arms (e.g., wheelchair or bedside chair)?: A Lot ?Help needed to walk in hospital room?: Total ?Help needed  climbing 3-5 steps with a railing? : Total ?6 Click Score: 10 ? ?  ?End of Session Equipment Utilized During Treatment: Gait belt;Oxygen ?Activity Tolerance: Patient limited by fatigue ?Patient left: in chair;with call bell/phone within reach;with chair alarm set ?Nurse Communication: Mobility status ?PT Visit Diagnosis: Unsteadiness on feet (R26.81);Muscle weakness (generalized) (M62.81);Difficulty in walking, not elsewhere classified (R26.2) ?  ? ?Time: 8676-7209 ?PT Time Calculation (min) (ACUTE ONLY): 24 min ? ? ?Charges:   PT Evaluation ?$PT Eval Low Complexity: 1 Low ?  ?  ?   ? ? ?Debe Coder PT ?Acute Rehabilitation Services ?Pager [phone] ?Office [phone] ? ? ?Sophee Mckimmy ?06/08/2021, 4:01 PM ? ?

## 2021-06-09 DIAGNOSIS — Z515 Encounter for palliative care: Secondary | ICD-10-CM | POA: Diagnosis not present

## 2021-06-09 DIAGNOSIS — J9601 Acute respiratory failure with hypoxia: Secondary | ICD-10-CM | POA: Diagnosis not present

## 2021-06-09 DIAGNOSIS — I471 Supraventricular tachycardia: Secondary | ICD-10-CM | POA: Diagnosis not present

## 2021-06-09 DIAGNOSIS — Z7189 Other specified counseling: Secondary | ICD-10-CM | POA: Diagnosis not present

## 2021-06-09 LAB — BASIC METABOLIC PANEL
Anion gap: 7 (ref 5–15)
BUN: 16 mg/dL (ref 8–23)
CO2: 36 mmol/L — ABNORMAL HIGH (ref 22–32)
Calcium: 8.3 mg/dL — ABNORMAL LOW (ref 8.9–10.3)
Chloride: 91 mmol/L — ABNORMAL LOW (ref 98–111)
Creatinine, Ser: 0.59 mg/dL (ref 0.44–1.00)
GFR, Estimated: >60 mL/min (ref >60)
Glucose, Bld: 74 mg/dL (ref 70–99)
Potassium: 3.1 mmol/L — ABNORMAL LOW (ref 3.5–5.1)
Sodium: 134 mmol/L — ABNORMAL LOW (ref 135–145)

## 2021-06-09 LAB — CBC
HCT: 27.4 % — ABNORMAL LOW (ref 36.0–46.0)
Hemoglobin: 8.7 g/dL — ABNORMAL LOW (ref 12.0–15.0)
MCH: 29.4 pg (ref 26.0–34.0)
MCHC: 31.8 g/dL (ref 30.0–36.0)
MCV: 92.6 fL (ref 80.0–100.0)
Platelets: 347 10*3/uL (ref 150–400)
RBC: 2.96 MIL/uL — ABNORMAL LOW (ref 3.87–5.11)
RDW: 14.4 % (ref 11.5–15.5)
WBC: 5.1 10*3/uL (ref 4.0–10.5)
nRBC: 0 % (ref 0.0–0.2)

## 2021-06-09 LAB — PHOSPHORUS: Phosphorus: 3.1 mg/dL (ref 2.5–4.6)

## 2021-06-09 LAB — MAGNESIUM: Magnesium: 1.9 mg/dL (ref 1.7–2.4)

## 2021-06-09 MED ORDER — MAGNESIUM SULFATE 2 GM/50ML IV SOLN
2.0000 g | Freq: Once | INTRAVENOUS | Status: AC
  Administered 2021-06-09: 2 g via INTRAVENOUS
  Filled 2021-06-09: qty 50

## 2021-06-09 MED ORDER — AMIODARONE HCL 200 MG PO TABS
200.0000 mg | ORAL_TABLET | Freq: Every day | ORAL | Status: DC
Start: 2021-06-09 — End: 2021-06-11
  Administered 2021-06-09 – 2021-06-11 (×3): 200 mg via ORAL
  Filled 2021-06-09 (×3): qty 1

## 2021-06-09 MED ORDER — PHENYLEPHRINE 40 MCG/ML (10ML) SYRINGE FOR IV PUSH (FOR BLOOD PRESSURE SUPPORT)
PREFILLED_SYRINGE | INTRAVENOUS | Status: AC
  Filled 2021-06-09: qty 20

## 2021-06-09 MED ORDER — POTASSIUM CHLORIDE CRYS ER 20 MEQ PO TBCR
40.0000 meq | EXTENDED_RELEASE_TABLET | ORAL | Status: AC
Start: 2021-06-09 — End: 2021-06-09
  Administered 2021-06-09 (×2): 40 meq via ORAL
  Filled 2021-06-09 (×2): qty 2

## 2021-06-09 NOTE — Progress Notes (Signed)
? ?                                                                                                                                                     ?                                                   ?Daily Progress Note  ? ?Patient Name: Samantha Fitzgerald       Date: 06/09/2021 ?DOB: [birth year]  Age: 88 y.o. MRN#: [id number] ?Attending Physician: Elmarie Shiley, MD ?Primary Care Physician: System, Provider Not In ?Admit Date: 06/01/2021 ? ?Reason for Consultation/Follow-up: Establishing goals of care ? ?Subjective: ?I saw and examined Samantha Fitzgerald today.  She was sitting in bed in no distress.  Denies any complaints today. ? ?Discussed with bedside RN and no significant new developments today. ?  ?Length of Stay: 8 ? ?Current Medications: ?Scheduled Meds:  ? apixaban  5 mg Oral Q12H  ? Chlorhexidine Gluconate Cloth  6 each Topical Q0600  ? COVID-19 mRNA bivalent vaccine (Pfizer)  0.3 mL Intramuscular Once  ? digoxin  0.125 mg Oral Daily  ? feeding supplement  237 mL Oral BID BM  ? fluticasone furoate-vilanterol  1 puff Inhalation Daily  ? And  ? umeclidinium bromide  1 puff Inhalation Daily  ? furosemide  20 mg Oral Daily  ? mouth rinse  15 mL Mouth Rinse BID  ? sertraline  25 mg Oral Daily  ? ? ?Continuous Infusions: ? amiodarone 30 mg/hr (06/09/21 0039)  ? ? ?PRN Meds: ?acetaminophen **OR** acetaminophen, fentaNYL (SUBLIMAZE) injection, ipratropium-albuterol, LORazepam ? ?Physical Exam      ?General: Frail  ?Heart: Irregular and tachycardic  ?lungs: Fair air movement, clear ?Abdomen: Soft, nontender, nondistended, positive bowel sounds.   ?Ext: No significant edema ?Skin: Warm and dry ?Neuro: Grossly intact, nonfocal.  Pleasant and conversational but does not have full insight into her overall condition. ?    ? ?Vital Signs: BP (!) 142/39   Pulse (!) 54   Temp 98 ?F (36.7 ?C) (Oral)   Resp 16   Ht 5\' 6"  (1.676 m)   Wt 47.6 kg   SpO2 99%   BMI 16.94 kg/m?  ?SpO2: SpO2: 99 % ?O2 Device: O2 Device:  Nasal Cannula ?O2 Flow Rate: O2 Flow Rate (L/min): 3 L/min ? ?Intake/output summary:  ?Intake/Output Summary (Last 24 hours) at 06/09/2021 0759 ?Last data filed at 06/09/2021 0039 ?Gross per 24 hour  ?Intake 266.66 ml  ?Output 550 ml  ?Net -283.34 ml  ? ? ?LBM: Last BM Date : 06/08/21 ?Baseline Weight: Weight: 54.4 kg ?Most recent weight: Weight: 47.6 kg ? ?     ?  Palliative Assessment/Data: ? ? ? ?Flowsheet Rows   ? ?Flowsheet Row Most Recent Value  ?Intake Tab   ?Referral Department Hospitalist  ?Unit at Time of Referral ICU  ?Palliative Care Primary Diagnosis Cardiac  ?Date Notified 06/05/21  ?Palliative Care Type New Palliative care  ?Reason for referral Clarify Goals of Care  ?Date of Admission 06/01/21  ?Date first seen by Palliative Care 06/05/21  ?# of days Palliative referral response time 0 Day(s)  ?# of days IP prior to Palliative referral 4  ?Clinical Assessment   ?Palliative Performance Scale Score 40%  ?Psychosocial & Spiritual Assessment   ?Palliative Care Outcomes   ?Patient/Family meeting held? Yes  ?Who was at the meeting? patient  ? ?  ? ? ?Patient Active Problem List  ? Diagnosis Date Noted  ? Decubitus ulcer of sacral region, stage 1 06/04/2021  ? Paroxysmal SVT (supraventricular tachycardia) ([place]) 06/02/2021  ? Acute respiratory failure with hypoxemia ([place]) 06/02/2021  ? Pulmonary hypertension ([place]) 06/02/2021  ? Bilateral pleural effusion 06/02/2021  ? Protein-calorie malnutrition, severe ([place]) 06/02/2021  ? Acute respiratory failure with hypoxia ([place]) 06/01/2021  ? Acute on chronic respiratory failure with hypoxia ([place]) 05/08/2021  ? COPD with acute exacerbation ([place]) 05/08/2021  ? History of DVT (deep vein thrombosis) 05/08/2021  ? Pressure injury of skin 02/27/2021  ? Acute on chronic respiratory failure with hypoxemia ([place]) 02/26/2021  ? Bacteremia   ? Palliative care by specialist   ? Goals of care, counseling/discussion   ? General weakness   ? Sepsis ([place]) 12/03/2020  ? COVID-19 virus  infection 12/03/2020  ? Acute lower UTI 12/03/2020  ? Cellulitis 12/03/2020  ? Chronic diastolic CHF (congestive heart failure) ([place]) 12/03/2020  ? Hypoalbuminemia 12/03/2020  ? AKI (acute kidney injury) ([place]) 12/03/2020  ? Pleural effusion on left 09/12/2020  ? COPD (chronic obstructive pulmonary disease) ([place]) 09/12/2020  ? Sick sinus syndrome ([place]) 09/12/2020  ? Hypoxia 09/12/2020  ? ? ?Palliative Care Assessment & Plan  ? ?Patient Profile: ?88 y.o. female  with past medical history of COPD, lung, pleural effusions, chronic diastolic heart failure, essential hypertension, PSVT, hypertension, syndrome, admitted on 06/01/2021 with acute on chronic hypoxic respiratory failure secondary to heart failure, COPD exacerbation, bilateral pleural effusions. ? ?Palliative consult for goals of care. ? ?Recommendations/Plan: ?DNR/DNI ?Continue current care without escalation.  We discussed specifically avoiding pressor support in the event of further decompensation and family is in agreement.  Plan will be to continue with gentle medical interventions while also concurrently ensuring that she has medications as needed for any symptoms.  Family understands she is at high risk for further decompensation and primary goal in this instance would be to ensure she does not suffer. ? ? ?Code Status: ? ?  ?Code Status Orders  ?(From admission, onward)  ?  ? ? ?  ? ?  Start     Ordered  ? 06/02/21 0045  Do not attempt resuscitation (DNR)  Continuous       ?Question Answer Comment  ?In the event of cardiac or respiratory ARREST Do not call a ?code blue?   ?In the event of cardiac or respiratory ARREST Do not perform Intubation, CPR, defibrillation or ACLS   ?In the event of cardiac or respiratory ARREST Use medication by any route, position, wound care, and other measures to relive pain and suffering. May use oxygen, suction and manual treatment of airway obstruction as needed for comfort.   ?  ? 06/02/21 0046  ? ?  ?  ? ?  ? ?  Code  Status History   ? ? Date Active Date Inactive Code Status Order ID Comments User Context  ? 06/02/2021 0006 06/02/2021 0046 DNR [id number]  Rise Patience, MD ED  ? 05/08/2021 2338 05/10/2021 2334 DNR [id number]  Orene Desanctis, DO ED  ? 02/27/2021 0731 03/02/2021 2039 DNR [id number]  [place] Eric J, DO Inpatient  ? 02/26/2021 1805 02/27/2021 0731 DNR [id number]  Orvis Brill, MD ED  ? 12/03/2020 2056 12/14/2020 1539 DNR [id number]  Toy Baker, MD ED  ? 12/03/2020 2008 12/03/2020 2056 DNR [id number]  Toy Baker, MD ED  ? 09/12/2020 2244 09/17/2020 1845 Full Code [id number]  Chotiner, Yevonne Aline, MD Inpatient  ? ?  ? ?Advance Directive Documentation   ? ?Flowsheet Row Most Recent Value  ?Type of Advance Directive Out of facility DNR (pink MOST or yellow form)  ?Pre-existing out of facility DNR order (yellow form or pink MOST form) Pink Most/Yellow Form available - Physician notified to receive inpatient order  ?"MOST" Form in Place? --  ? ?  ? ? ?Prognosis: ?Guarded ? ?Discharge Planning: ?To Be Determined ? ?Care plan was discussed with patient, Dr. Tyrell Antonio, Dr. Margaretann Loveless, patient's 2 daughters ? ?Thank you for allowing the Palliative Medicine Team to assist in the care of this patient. ? ?Total time: 45 minutes ? ?Micheline Rough, MD ? ?Please contact Palliative Medicine Team phone at [phone] for questions and concerns.  ? ? ? ? ? ?

## 2021-06-09 NOTE — Progress Notes (Signed)
? ?Progress Note ? ?Patient Name: Samantha Fitzgerald ?Date of Encounter: 06/09/2021 ? ?[facility] Cardiologist: new to [facility] - previously seen by [facility]   ? ?Subjective  ? ?Telemetry has been quiet on IV amiodarone. She feels better today.  ? ?Inpatient Medications  ?  ?Scheduled Meds: ? amiodarone  200 mg Oral Daily  ? apixaban  5 mg Oral Q12H  ? Chlorhexidine Gluconate Cloth  6 each Topical Q0600  ? COVID-19 mRNA bivalent vaccine (Pfizer)  0.3 mL Intramuscular Once  ? digoxin  0.125 mg Oral Daily  ? feeding supplement  237 mL Oral BID BM  ? fluticasone furoate-vilanterol  1 puff Inhalation Daily  ? And  ? umeclidinium bromide  1 puff Inhalation Daily  ? furosemide  20 mg Oral Daily  ? mouth rinse  15 mL Mouth Rinse BID  ? potassium chloride  40 mEq Oral Q4H  ? sertraline  25 mg Oral Daily  ? ?Continuous Infusions: ? magnesium sulfate bolus IVPB 2 g (06/09/21 0843)  ? ?PRN Meds: ?acetaminophen **OR** acetaminophen, fentaNYL (SUBLIMAZE) injection, ipratropium-albuterol, LORazepam  ? ?Vital Signs  ?  ?Vitals:  ? 06/09/21 0500 06/09/21 0700 06/09/21 0800 06/09/21 0810  ?BP:  (!) 138/41 (!) 145/37   ?Pulse:  (!) 51 (!) 50   ?Resp:  18 16   ?Temp:    97.7 ?F (36.5 ?C)  ?TempSrc:    Oral  ?SpO2:  100% 100%   ?Weight: 47.6 kg     ?Height:      ? ? ?Intake/Output Summary (Last 24 hours) at 06/09/2021 0847 ?Last data filed at 06/09/2021 0800 ?Gross per 24 hour  ?Intake 433.06 ml  ?Output 550 ml  ?Net -116.94 ml  ? ? ?  06/09/2021  ?  5:00 AM 06/08/2021  ?  4:08 AM 06/06/2021  ? 12:12 PM  ?Last 3 Weights  ?Weight (lbs) 104 lb 15 oz 108 lb 3.9 oz 103 lb 9.9 oz  ?Weight (kg) 47.6 kg 49.1 kg 47 kg  ?   ? ?Telemetry  ?  ?NSR went into narrow complex tachycardia around 6:30AM. - Personally Reviewed ? ?ECG  ?  ?Narrow complex tachycardia without obvious p wave - Personally Reviewed ? ?Physical Exam  ? ?GEN: cachectic, but no distress. ?Neck: No JVD ?Cardiac: bradycardic, no murmurs, rubs, or gallops.  ?Respiratory: diminished  and shallow ?GI: Soft, nontender, non-distended  ?MS: No edema; No deformity. ?Neuro:  Nonfocal  ?Psych: Normal affect  ? ?Labs  ?  ?High Sensitivity Troponin:   ?Recent Labs  ?Lab 06/02/21 ?0734 06/02/21 ?0941  ?TROPONINIHS 34* 28*  ?   ?Chemistry ?Recent Labs  ?Lab 06/05/21 ?0252 06/06/21 ?2671 06/07/21 ?0250 06/08/21 ?2458 06/09/21 ?0253  ?NA 136 137 134*  --  134*  ?K 3.7 3.6 3.8  --  3.1*  ?CL 93* 92* 92*  --  91*  ?CO2 35* 38* 37*  --  36*  ?GLUCOSE 93 91 85  --  74  ?BUN 15 18 20   --  16  ?CREATININE 0.51 0.60 0.56  --  0.59  ?CALCIUM 8.3* 8.7* 8.3*  --  8.3*  ?MG 2.0 1.8 2.3 2.0 1.9  ?PROT 5.6* 5.8* 5.6*  --   --   ?ALBUMIN 3.0* 2.9* 2.8*  --   --   ?AST 11* 14* 14*  --   --   ?ALT 9 10 10   --   --   ?ALKPHOS 66 70 63  --   --   ?BILITOT  0.1* 0.4 0.1*  --   --   ?GFRNONAA >60 >60 >60  --  >60  ?ANIONGAP 8 7 5   --  7  ?  ?Lipids No results for input(s): CHOL, TRIG, HDL, LABVLDL, LDLCALC, CHOLHDL in the last 168 hours.  ?Hematology ?Recent Labs  ?Lab 06/06/21 ?0333 06/07/21 ?0250 06/09/21 ?0253  ?WBC 6.2 5.1 5.1  ?RBC 3.25* 3.19* 2.96*  ?HGB 9.4* 9.3* 8.7*  ?HCT 30.5* 29.7* 27.4*  ?MCV 93.8 93.1 92.6  ?MCH 28.9 29.2 29.4  ?MCHC 30.8 31.3 31.8  ?RDW 14.4 14.3 14.4  ?PLT 406* 397 347  ? ?Thyroid  ?No results for input(s): TSH, FREET4 in the last 168 hours. ?  ?BNP ?Recent Labs  ?Lab 06/05/21 ?0252  ?BNP 79.8  ?  ?DDimer No results for input(s): DDIMER in the last 168 hours.  ? ?Radiology  ?  ?No results found. ? ?Cardiac Studies  ? ?Echo 06/03/2021 ? 1. Left ventricular ejection fraction, by estimation, is 60 to 65%. The  ?left ventricle has normal function. The left ventricle has no regional  ?wall motion abnormalities. Left ventricular diastolic parameters are  ?indeterminate.  ? 2. Right ventricular systolic function is mildly reduced. The right  ?ventricular size is mildly enlarged.  ? 3. The mitral valve is normal in structure. No evidence of mitral valve  ?regurgitation. No evidence of mitral stenosis.  ?  4. The aortic valve is normal in structure. Aortic valve regurgitation is  ?not visualized. No aortic stenosis is present.  ? 5. The inferior vena cava is normal in size with greater than 50%  ?respiratory variability, suggesting right atrial pressure of 3 mmHg.  ? ?Patient Profile  ?   ?88 y.o. female with PMH of COPD, chronic respiratory failure on 24/7 O2, former tobacco use, paroxysmal SVT, PAF, tachybrady syndrome, hypertension with significant orthostatic hypotension on midodrine, hypothyroidism, DVT and h/o lung CA who was seen  for the evaluation of acute diastolic heart failure in the setting of tachypalpitations at the request of Dr. Tyrell Antonio ? ?Assessment & Plan  ?  ?Acute diastolic CHF ? - occurred in the setting of SVT. Euvolemic, back on home dose of lasix 20mg  daily.  ? ?Acute on chronic respiratory failure: treated for both PNA and acute CHF. On 24/7 O2 at home. H/o significant COPD and tobacco abuse. No longer smoker ? ?H/o paroxysmal atrial flutter  ? - likely atrial flutter episodes given history of AF and rate of 120s-140s.  ?- continue amiodarone. Will transition IV amio to oral amio (same bioavailability) to facilitate eventual hospital discharge. Amio 200 mg daily, can decrease to 100 mg daily at time of hospital discharge. ? ?H/o PAF: Eliquis on board. No longer on beta blocker due to history of significant orthostatic hypotension ? ?COPD ? ?HTN with significant orhostatic hypotension: not on BB ? ? ?Cardiology will sign off. Call if concerns.    ? ?For questions or updates, please contact [place] ?Please consult [email or web address] for contact info under  ? ?  ?   ?Signed, ?Elouise Munroe, MD  ?06/09/2021, 8:47 AM   ? ?

## 2021-06-09 NOTE — Progress Notes (Signed)
? ?                                                                                                                                                     ?                                                   ?Daily Progress Note  ? ?Patient Name: Samantha Fitzgerald       Date: 06/09/2021 ?DOB: [birth year]  Age: 88 y.o. MRN#: [id number] ?Attending Physician: Elmarie Shiley, MD ?Primary Care Physician: System, Provider Not In ?Admit Date: 06/01/2021 ? ?Reason for Consultation/Follow-up: Establishing goals of care ? ?Subjective: ?I saw and examined Samantha Fitzgerald today.  She was sitting in bed in no distress.  Denies any complaints today. ? ?I called and discussed with her daughter, [name].  We discussed care plan and goals for her care moving forward.  As outlined by CSW in her note on 3/21, Samantha Fitzgerald is approaching end of her Medicare days for skilled rehab at [facility] care.  Eventual need will be for long-term placement and she does have long-term care insurance per her daughter's report.  We discussed that, when the time comes that she is under long-term care, she would be well served to elect her hospice benefits for additional support with goal of staying out of the hospital and focusing on her quality of life moving forward.  Daughter reports being appreciative of conversation and requested call from [facility] to further discuss tomorrow. ?  ?Length of Stay: 8 ? ?Current Medications: ?Scheduled Meds:  ? amiodarone  200 mg Oral Daily  ? apixaban  5 mg Oral Q12H  ? Chlorhexidine Gluconate Cloth  6 each Topical Q0600  ? COVID-19 mRNA bivalent vaccine (Pfizer)  0.3 mL Intramuscular Once  ? digoxin  0.125 mg Oral Daily  ? feeding supplement  237 mL Oral BID BM  ? fluticasone furoate-vilanterol  1 puff Inhalation Daily  ? And  ? umeclidinium bromide  1 puff Inhalation Daily  ? furosemide  20 mg Oral Daily  ? mouth rinse  15 mL Mouth Rinse BID  ? phenylephrine      ? sertraline  25 mg Oral Daily  ? ? ?Continuous  Infusions: ? ? ? ?PRN Meds: ?acetaminophen **OR** acetaminophen, fentaNYL (SUBLIMAZE) injection, ipratropium-albuterol, LORazepam ? ?Physical Exam      ?General: Frail  ?Heart: Irregular and tachycardic  ?lungs: Fair air movement, clear ?Abdomen: Soft, nontender, nondistended, positive bowel sounds.   ?Ext: No significant edema ?Skin: Warm and dry ?Neuro: Grossly intact, nonfocal.  Pleasant and conversational but does not have full insight into her overall condition. ?    ? ?  Vital Signs: BP 138/63   Pulse 64   Temp 97.8 ?F (36.6 ?C) (Oral)   Resp 17   Ht 5\' 6"  (1.676 m)   Wt 47.6 kg   SpO2 95%   BMI 16.94 kg/m?  ?SpO2: SpO2: 95 % ?O2 Device: O2 Device: Nasal Cannula ?O2 Flow Rate: O2 Flow Rate (L/min): 3 L/min ? ?Intake/output summary:  ?Intake/Output Summary (Last 24 hours) at 06/09/2021 1941 ?Last data filed at 06/09/2021 1800 ?Gross per 24 hour  ?Intake 319.46 ml  ?Output 700 ml  ?Net -380.54 ml  ? ? ?LBM: Last BM Date : 06/08/21 ?Baseline Weight: Weight: 54.4 kg ?Most recent weight: Weight: 47.6 kg ? ?     ?Palliative Assessment/Data: ? ? ? ?Flowsheet Rows   ? ?Flowsheet Row Most Recent Value  ?Intake Tab   ?Referral Department Hospitalist  ?Unit at Time of Referral ICU  ?Palliative Care Primary Diagnosis Cardiac  ?Date Notified 06/05/21  ?Palliative Care Type New Palliative care  ?Reason for referral Clarify Goals of Care  ?Date of Admission 06/01/21  ?Date first seen by Palliative Care 06/05/21  ?# of days Palliative referral response time 0 Day(s)  ?# of days IP prior to Palliative referral 4  ?Clinical Assessment   ?Palliative Performance Scale Score 40%  ?Psychosocial & Spiritual Assessment   ?Palliative Care Outcomes   ?Patient/Family meeting held? Yes  ?Who was at the meeting? patient  ? ?  ? ? ?Patient Active Problem List  ? Diagnosis Date Noted  ? Decubitus ulcer of sacral region, stage 1 06/04/2021  ? Paroxysmal SVT (supraventricular tachycardia) ([place]) 06/02/2021  ? Acute respiratory failure with  hypoxemia ([place]) 06/02/2021  ? Pulmonary hypertension ([place]) 06/02/2021  ? Bilateral pleural effusion 06/02/2021  ? Protein-calorie malnutrition, severe ([place]) 06/02/2021  ? Acute respiratory failure with hypoxia ([place]) 06/01/2021  ? Acute on chronic respiratory failure with hypoxia ([place]) 05/08/2021  ? COPD with acute exacerbation ([place]) 05/08/2021  ? History of DVT (deep vein thrombosis) 05/08/2021  ? Pressure injury of skin 02/27/2021  ? Acute on chronic respiratory failure with hypoxemia ([place]) 02/26/2021  ? Bacteremia   ? Palliative care by specialist   ? Goals of care, counseling/discussion   ? General weakness   ? Sepsis ([place]) 12/03/2020  ? COVID-19 virus infection 12/03/2020  ? Acute lower UTI 12/03/2020  ? Cellulitis 12/03/2020  ? Chronic diastolic CHF (congestive heart failure) ([place]) 12/03/2020  ? Hypoalbuminemia 12/03/2020  ? AKI (acute kidney injury) ([place]) 12/03/2020  ? Pleural effusion on left 09/12/2020  ? COPD (chronic obstructive pulmonary disease) ([place]) 09/12/2020  ? Sick sinus syndrome ([place]) 09/12/2020  ? Hypoxia 09/12/2020  ? ? ?Palliative Care Assessment & Plan  ? ?Patient Profile: ?88 y.o. female  with past medical history of COPD, lung, pleural effusions, chronic diastolic heart failure, essential hypertension, PSVT, hypertension, syndrome, admitted on 06/01/2021 with acute on chronic hypoxic respiratory failure secondary to heart failure, COPD exacerbation, bilateral pleural effusions. ? ?Palliative consult for goals of care. ? ?Recommendations/Plan: ?DNR/DNI ?Continue current care without escalation.   ?Discussed eventual goal of electing hospice services when she finds long-term placement.  Daughter requesting call from [facility] to discuss disposition.  I placed a consult. ? ? ?Code Status: ? ?  ?Code Status Orders  ?(From admission, onward)  ?  ? ? ?  ? ?  Start     Ordered  ? 06/02/21 0045  Do not attempt resuscitation (DNR)  Continuous       ?Question Answer Comment  ?  In the event of cardiac or  respiratory ARREST Do not call a ?code blue?   ?In the event of cardiac or respiratory ARREST Do not perform Intubation, CPR, defibrillation or ACLS   ?In the event of cardiac or respiratory ARREST Use medication by any route, position, wound care, and other measures to relive pain and suffering. May use oxygen, suction and manual treatment of airway obstruction as needed for comfort.   ?  ? 06/02/21 0046  ? ?  ?  ? ?  ? ?Code Status History   ? ? Date Active Date Inactive Code Status Order ID Comments User Context  ? 06/02/2021 0006 06/02/2021 0046 DNR [id number]  Rise Patience, MD ED  ? 05/08/2021 2338 05/10/2021 2334 DNR [id number]  Orene Desanctis, DO ED  ? 02/27/2021 0731 03/02/2021 2039 DNR [id number]  [place] Eric J, DO Inpatient  ? 02/26/2021 1805 02/27/2021 0731 DNR [id number]  Orvis Brill, MD ED  ? 12/03/2020 2056 12/14/2020 1539 DNR [id number]  Toy Baker, MD ED  ? 12/03/2020 2008 12/03/2020 2056 DNR [id number]  Toy Baker, MD ED  ? 09/12/2020 2244 09/17/2020 1845 Full Code [id number]  Chotiner, Yevonne Aline, MD Inpatient  ? ?  ? ?Advance Directive Documentation   ? ?Flowsheet Row Most Recent Value  ?Type of Advance Directive Out of facility DNR (pink MOST or yellow form)  ?Pre-existing out of facility DNR order (yellow form or pink MOST form) Pink Most/Yellow Form available - Physician notified to receive inpatient order  ?"MOST" Form in Place? --  ? ?  ? ? ?Prognosis: ?Guarded ? ?Discharge Planning: ?To Be Determined ? ?Care plan was discussed with patient, Dr. Tyrell Antonio, daughter, Sheilagh ? ?Thank you for allowing the Palliative Medicine Team to assist in the care of this patient. ? ?Total time: 40 minutes ? ?Micheline Rough, MD ? ?Please contact Palliative Medicine Team phone at [phone] for questions and concerns.  ? ? ? ? ? ?

## 2021-06-09 NOTE — Progress Notes (Signed)
?PROGRESS NOTE ? ? ? ?Samantha Fitzgerald  JOI:[id number] DOB: 19-Oct-1924 DOA: 06/01/2021 ?PCP: System, Provider Not In  ? ?Brief Narrative: ?88 year old with past medical history significant for COPD chronic respiratory failure with hypoxia on 3 L of oxygen at home, malignant neoplasm of the bronchus and lung, pleural effusion, chronic diastolic heart failure, essential hypertension, PSVT, pulmonary hypertension, sick sinus syndrome, history of DVT who was brought to the ER.  She was complaining of worsening shortness of breath.  Evaluation in the ED patient was found to be in SVT with heart rate in the 140.  CT angio of the chest was negative for PE did show bilateral pleural effusion and groundglass opacity concerning for pneumonia. ? ?For SVT she received a dose of adenosine, which did not resolve the SVT.  Subsequently she was a started on amiodarone drip.  Subsequently transition to flecainide.  ? ?Cardiology consulted to assist with management of heart failure exacerbation and arrhythmia. ? ?Patient develops hypotension, SBP in the 60--70, HR 130 A fib. She received IV bolus, SBP increased 95--subsequently she converted to sinus and BP increase 120. Sh will be started on IV amiodarone.  ? ? ? ?Assessment & Plan: ?  ?Principal Problem: ?  Acute respiratory failure with hypoxia ([place]) ?Active Problems: ?  Pleural effusion on left ?  COPD (chronic obstructive pulmonary disease) ([place]) ?  Sick sinus syndrome ([place]) ?  Chronic diastolic CHF (congestive heart failure) ([place]) ?  Acute on chronic respiratory failure with hypoxemia (H[facility]) ?  History of DVT (deep vein thrombosis) ?  Paroxysmal SVT (supraventricular tachycardia) (H[facility]) ?  Acute respiratory failure with hypoxemia (H[facility]) ?  Pulmonary hypertension ([place]) ?  Bilateral pleural effusion ?  Protein-calorie malnutrition, severe ([place]) ?  Decubitus ulcer of sacral region, stage 1 ? ? ?1-SVT/ A flutter  ?Per admission note 3/19 on-call cardiology  Dr. Zigmund Daniel recommended  IV adenosine, if not improvement start amiodarone.  ?Patient was started on IV amiodarone, subsequently transition to Flecainide.  ?3/22: Cardiology consulted to assist with arrhythmia. Dr Alba Cory recommend stop flecainide and start amiodarone. Also recommended palliative care consultation.  ?3/23: -Patient develops tachycardia HR 130 A fib--Hypotension, she was SOB. She received IV Bolus. Subsequently BP increased to 95. Patient then sinus converted--SBP increased 120.  ?-Started on IV amiodarone.  ?-Recurrent episode os SVT and hypotension 3/24.  Received IV fluids and amiodarone bolus. With resolution of SVT.  ?-Family discussed with palliative, no IV pressors or further scalation of care.  ?-plan to transition to oral Amiodarone.  ? ?Acute on chronic hypoxic respiratory failure; secondary to bilateral pleural effusion and heart failure exacerbation ?-Patient also completed 5 days of antibiotics ?-Continue with DuoNeb ?-Continue with lasix.  ? ?2-COPD exacerbation: Continue with DuoNeb. ? ?3-Bilateral pleural effusion: Received IV Lasix ?X-ray 3/21 persistent bilateral pleural effusion ?Continue with oral Lasix.Marland Kitchen  ? ?4-Acute chronic diastolic heart failure: ?Presented with shortness of breath, bilateral pleural effusion elevated BNP ?Resume oral lasix  ?Cardiology consulted. ? ?HTN; allow higher  BP due to recurrent SVT with hypotension.  ? ?Pressure Injury 06/02/21 Sacrum Stage 2 -  Partial thickness loss of dermis presenting as a shallow open injury with a red, pink wound bed without slough. stage 2 with non-blanchable redness (Active)  ?06/02/21 0404  ?Location: Sacrum  ?Location Orientation:   ?Staging: Stage 2 -  Partial thickness loss of dermis presenting as a shallow open injury with a red, pink wound bed without slough.  ?Wound Description (Comments): stage 2 with non-blanchable  redness  ?Present on Admission: Yes  ?Dressing Type Foam - Lift dressing to assess site every shift 06/09/21 0800   ? ? ? ?Nutrition Problem: Severe Malnutrition ?Etiology: chronic illness ? ? ? ?Signs/Symptoms: severe fat depletion, severe muscle depletion ? ? ? ?Interventions: Ensure Enlive (each supplement provides 350kcal and 20 grams of protein), Liberalize Diet ? ?Estimated body mass index is 16.94 kg/m? as calculated from the following: ?  Height as of this encounter: 5\' 6"  (1.676 m). ?  Weight as of this encounter: 47.6 kg. ? ? ?DVT prophylaxis: Eliquis ?Code Status: DNR ?Family Communication: Daughter at bedside 3/25 ?Disposition Plan:  ?Status is: Inpatient ?Remains inpatient appropriate because: remain in patient for IV lasix.  ? ? ? ?Consultants:  ?Cardiology  ? ?Procedures:  ?None ? ?Antimicrobials:  ? ? ?Subjective: ?No new complaints, doing well.  ? ?Objective: ?Vitals:  ? 06/09/21 0500 06/09/21 0700 06/09/21 0800 06/09/21 0810  ?BP:  (!) 138/41 (!) 145/37   ?Pulse:  (!) 51 (!) 50   ?Resp:  18 16   ?Temp:    97.7 ?F (36.5 ?C)  ?TempSrc:    Oral  ?SpO2:  100% 100%   ?Weight: 47.6 kg     ?Height:      ? ? ?Intake/Output Summary (Last 24 hours) at 06/09/2021 1140 ?Last data filed at 06/09/2021 0800 ?Gross per 24 hour  ?Intake 383.07 ml  ?Output 550 ml  ?Net -166.93 ml  ? ? ?Filed Weights  ? 06/06/21 1212 06/08/21 0408 06/09/21 0500  ?Weight: 47 kg 49.1 kg 47.6 kg  ? ? ?Examination: ? ?General exam: NAD ?Respiratory system: BL crackles.  ?Cardiovascular system:  S 1, S 2 RRR ?Gastrointestinal system: BS present, soft, nt ?Central nervous system: Alert, follows command ?Extremities: No edema ? ?Data Reviewed: I have personally reviewed following labs and imaging studies ? ?CBC: ?Recent Labs  ?Lab 06/03/21 ?0302 06/04/21 ?0225 06/05/21 ?7062 06/06/21 ?3762 06/07/21 ?8315 06/09/21 ?1761  ?WBC 6.5 6.0 7.9 6.2 5.1 5.1  ?NEUTROABS 4.4 3.8 5.4 3.9 2.8  --   ?HGB 8.8* 8.5* 9.0* 9.4* 9.3* 8.7*  ?HCT 27.5* 26.6* 28.7* 30.5* 29.7* 27.4*  ?MCV 91.1 92.7 94.7 93.8 93.1 92.6  ?PLT 438* 419* 423* 406* 397 347  ? ? ?Basic Metabolic  Panel: ?Recent Labs  ?Lab 06/04/21 ?0225 06/05/21 ?6073 06/06/21 ?7106 06/07/21 ?0250 06/08/21 ?2694 06/09/21 ?8546  ?NA 134* 136 137 134*  --  134*  ?K 3.6 3.7 3.6 3.8  --  3.1*  ?CL 95* 93* 92* 92*  --  91*  ?CO2 34* 35* 38* 37*  --  36*  ?GLUCOSE 85 93 91 85  --  74  ?BUN 11 15 18 20   --  16  ?CREATININE 0.45 0.51 0.60 0.56  --  0.59  ?CALCIUM 8.0* 8.3* 8.7* 8.3*  --  8.3*  ?MG 2.0 2.0 1.8 2.3 2.0 1.9  ?PHOS 3.2 3.0 3.2 3.6 3.2 3.1  ? ? ?GFR: ?Estimated Creatinine Clearance: 32.3 mL/min (by C-G formula based on SCr of 0.59 mg/dL). ?Liver Function Tests: ?Recent Labs  ?Lab 06/03/21 ?0302 06/04/21 ?0225 06/05/21 ?2703 06/06/21 ?5009 06/07/21 ?0250  ?AST 12* 12* 11* 14* 14*  ?ALT 10 8 9 10 10   ?ALKPHOS 38 18 29 93 71  ?BILITOT 0.1* <0.1* 0.1* 0.4 0.1*  ?PROT 5.8* 5.1* 5.6* 5.8* 5.6*  ?ALBUMIN 3.2* 2.7* 3.0* 2.9* 2.8*  ? ? ?No results for input(s): LIPASE, AMYLASE in the last 168 hours. ?No results for input(s): AMMONIA  in the last 168 hours. ?Coagulation Profile: ?No results for input(s): INR, PROTIME in the last 168 hours. ?Cardiac Enzymes: ?No results for input(s): CKTOTAL, CKMB, CKMBINDEX, TROPONINI in the last 168 hours. ?BNP (last 3 results) ?No results for input(s): PROBNP in the last 8760 hours. ?HbA1C: ?No results for input(s): HGBA1C in the last 72 hours. ?CBG: ?Recent Labs  ?Lab 06/06/21 ?1128  ?GLUCAP 118*  ? ? ?Lipid Profile: ?No results for input(s): CHOL, HDL, LDLCALC, TRIG, CHOLHDL, LDLDIRECT in the last 72 hours. ?Thyroid Function Tests: ?No results for input(s): TSH, T4TOTAL, FREET4, T3FREE, THYROIDAB in the last 72 hours. ?Anemia Panel: ?No results for input(s): VITAMINB12, FOLATE, FERRITIN, TIBC, IRON, RETICCTPCT in the last 72 hours. ?Sepsis Labs: ?No results for input(s): PROCALCITON, LATICACIDVEN in the last 168 hours. ? ? ?Recent Results (from the past 240 hour(s))  ?Culture, blood (Routine X 2) w Reflex to ID Panel     Status: None  ? Collection Time: 06/01/21  9:07 PM  ? Specimen: BLOOD   ?Result Value Ref Range Status  ? Specimen Description   Final  ?  BLOOD BLOOD LEFT FOREARM ?Performed at [facility], [place] [address] [place], [place] [address] ?  ? Special Requests   Final  ?  BOTT

## 2021-06-10 DIAGNOSIS — J9601 Acute respiratory failure with hypoxia: Secondary | ICD-10-CM | POA: Diagnosis not present

## 2021-06-10 LAB — BASIC METABOLIC PANEL
Anion gap: 5 (ref 5–15)
BUN: 15 mg/dL (ref 8–23)
CO2: 34 mmol/L — ABNORMAL HIGH (ref 22–32)
Calcium: 8.4 mg/dL — ABNORMAL LOW (ref 8.9–10.3)
Chloride: 97 mmol/L — ABNORMAL LOW (ref 98–111)
Creatinine, Ser: 0.64 mg/dL (ref 0.44–1.00)
GFR, Estimated: >60 mL/min (ref >60)
Glucose, Bld: 76 mg/dL (ref 70–99)
Potassium: 4.4 mmol/L (ref 3.5–5.1)
Sodium: 136 mmol/L (ref 135–145)

## 2021-06-10 LAB — MAGNESIUM: Magnesium: 2.1 mg/dL (ref 1.7–2.4)

## 2021-06-10 LAB — PHOSPHORUS: Phosphorus: 3.3 mg/dL (ref 2.5–4.6)

## 2021-06-10 MED ORDER — LOPERAMIDE HCL 2 MG PO CAPS
2.0000 mg | ORAL_CAPSULE | Freq: Once | ORAL | Status: AC
  Administered 2021-06-10: 2 mg via ORAL
  Filled 2021-06-10: qty 1

## 2021-06-10 MED ORDER — SACCHAROMYCES BOULARDII 250 MG PO CAPS
250.0000 mg | ORAL_CAPSULE | Freq: Two times a day (BID) | ORAL | Status: DC
  Administered 2021-06-10 – 2021-06-11 (×2): 250 mg via ORAL
  Filled 2021-06-10 (×2): qty 1

## 2021-06-10 NOTE — Progress Notes (Signed)
?PROGRESS NOTE ? ? ? ?Samantha Fitzgerald  SWF:[id number] DOB: 1924/12/04 DOA: 06/01/2021 ?PCP: System, Provider Not In  ? ?Brief Narrative: ?89 year old with past medical history significant for COPD chronic respiratory failure with hypoxia on 3 L of oxygen at home, malignant neoplasm of the bronchus and lung, pleural effusion, chronic diastolic heart failure, essential hypertension, PSVT, pulmonary hypertension, sick sinus syndrome, history of DVT who was brought to the ER.  She was complaining of worsening shortness of breath.  Evaluation in the ED patient was found to be in SVT with heart rate in the 140.  CT angio of the chest was negative for PE did show bilateral pleural effusion and groundglass opacity concerning for pneumonia. ? ?For SVT she received a dose of adenosine, which did not resolve the SVT.  Subsequently she was a started on amiodarone drip.  Subsequently transition to flecainide.  ? ?Cardiology consulted to assist with management of heart failure exacerbation and arrhythmia. ? ?Patient develops hypotension, SBP in the 60--70, HR 130 A fib. She received IV bolus, SBP increased 95--subsequently she converted to sinus and BP increase 120. She was started on IV amiodarone.  ? ?Patient has remain stable on oral amiodarone. Plan to transfer to facility 3/28. She will need palliative care follow up.  ? ? ? ?Assessment & Plan: ?  ?Principal Problem: ?  Acute respiratory failure with hypoxia ([place]) ?Active Problems: ?  Pleural effusion on left ?  COPD (chronic obstructive pulmonary disease) ([place]) ?  Sick sinus syndrome ([place]) ?  Chronic diastolic CHF (congestive heart failure) ([place]) ?  Acute on chronic respiratory failure with hypoxemia (H[facility]) ?  History of DVT (deep vein thrombosis) ?  Paroxysmal SVT (supraventricular tachycardia) (HCC) ?  Acute respiratory failure with hypoxemia (H[facility]) ?  Pulmonary hypertension ([place]) ?  Bilateral pleural effusion ?  Protein-calorie malnutrition, severe ([place]) ?  Decubitus  ulcer of sacral region, stage 1 ? ? ?1-SVT/ A flutter  ?Per admission note 3/19 on-call cardiology  Dr. Zigmund Daniel recommended IV adenosine, if not improvement start amiodarone.  ?Patient was started on IV amiodarone, subsequently transition to Flecainide.  ?3/22: Cardiology consulted to assist with arrhythmia. Dr Alba Cory recommend stop flecainide and start amiodarone. Also recommended palliative care consultation.  ?3/23: -Patient develops tachycardia HR 130 A fib--Hypotension, she was SOB. She received IV Bolus. Subsequently BP increased to 95. Patient then sinus converted--SBP increased 120.  ?-Recurrent episode os SVT and hypotension 3/24.  Received IV fluids and amiodarone bolus. With resolution of SVT.  ?-Family discussed with palliative, no IV pressors or further scalation of care.  ?-Transition  to oral Amiodarone 3/26. ?-no further episodes so far.  ? ?Acute on chronic hypoxic respiratory failure; secondary to bilateral pleural effusion and heart failure exacerbation ?-Patient also completed 5 days of antibiotics ?-Continue with DuoNeb ?-Continue with lasix.  ? ?2-COPD exacerbation: Continue with DuoNeb. ? ?3-Bilateral pleural effusion: Received IV Lasix ?X-ray 3/21 persistent bilateral pleural effusion ?Continue with oral Lasix.Marland Kitchen  ? ?4-Acute chronic diastolic heart failure: ?Presented with shortness of breath, bilateral pleural effusion elevated BNP ?Resume oral lasix  ? ?HTN; allow higher  BP due to recurrent SVT with hypotension.  ? ?Pressure Injury 06/02/21 Sacrum Stage 2 -  Partial thickness loss of dermis presenting as a shallow open injury with a red, pink wound bed without slough. stage 2 with non-blanchable redness (Active)  ?06/02/21 0404  ?Location: Sacrum  ?Location Orientation:   ?Staging: Stage 2 -  Partial thickness loss of dermis presenting as a  shallow open injury with a red, pink wound bed without slough.  ?Wound Description (Comments): stage 2 with non-blanchable redness  ?Present on  Admission: Yes  ?Dressing Type Foam - Lift dressing to assess site every shift 06/09/21 0800  ? ? ? ?Nutrition Problem: Severe Malnutrition ?Etiology: chronic illness ? ? ? ?Signs/Symptoms: severe fat depletion, severe muscle depletion ? ? ? ?Interventions: Ensure Enlive (each supplement provides 350kcal and 20 grams of protein), Liberalize Diet ? ?Estimated body mass index is 16.94 kg/m? as calculated from the following: ?  Height as of this encounter: 5\' 6"  (1.676 m). ?  Weight as of this encounter: 47.6 kg. ? ? ?DVT prophylaxis: Eliquis ?Code Status: DNR ?Family Communication: Daughter at bedside 3/25 ?Disposition Plan:  ?Status is: Inpatient ?Remains inpatient appropriate because: remain in patient for IV lasix.  ? ? ? ?Consultants:  ?Cardiology  ? ?Procedures:  ?None ? ?Antimicrobials:  ? ? ?Subjective: ?She report feeling well, denies worsening dyspnea.  ? ?Objective: ?Vitals:  ? 06/10/21 0707 06/10/21 0800 06/10/21 0804 06/10/21 1238  ?BP:  (!) 158/48    ?Pulse:  (!) 57    ?Resp:  14    ?Temp: 97.9 ?F (36.6 ?C)   98.3 ?F (36.8 ?C)  ?TempSrc: Oral   Oral  ?SpO2:  100% 97%   ?Weight:      ?Height:      ? ? ?Intake/Output Summary (Last 24 hours) at 06/10/2021 1259 ?Last data filed at 06/10/2021 0800 ?Gross per 24 hour  ?Intake 240 ml  ?Output 300 ml  ?Net -60 ml  ? ? ?Filed Weights  ? 06/06/21 1212 06/08/21 0408 06/09/21 0500  ?Weight: 47 kg 49.1 kg 47.6 kg  ? ? ?Examination: ? ?General exam: NAD ?Respiratory system: BL crackles.  ?Cardiovascular system:  S 1, S 2 RRR ?Gastrointestinal system: BS present, soft, nt.  ?Central nervous system: Alert, follows command ?Extremities: no edema ? ?Data Reviewed: I have personally reviewed following labs and imaging studies ? ?CBC: ?Recent Labs  ?Lab 06/04/21 ?0225 06/05/21 ?8841 06/06/21 ?6606 06/07/21 ?0250 06/09/21 ?0253  ?WBC 6.0 7.9 6.2 5.1 5.1  ?NEUTROABS 3.8 5.4 3.9 2.8  --   ?HGB 8.5* 9.0* 9.4* 9.3* 8.7*  ?HCT 26.6* 28.7* 30.5* 29.7* 27.4*  ?MCV 92.7 94.7 93.8 93.1  92.6  ?PLT 419* 423* 406* 397 347  ? ? ?Basic Metabolic Panel: ?Recent Labs  ?Lab 06/05/21 ?3016 06/06/21 ?0109 06/07/21 ?0250 06/08/21 ?3235 06/09/21 ?5732 06/10/21 ?0259  ?NA 136 137 134*  --  134* 136  ?K 3.7 3.6 3.8  --  3.1* 4.4  ?CL 93* 92* 92*  --  91* 97*  ?CO2 35* 38* 37*  --  36* 34*  ?GLUCOSE 93 91 85  --  74 76  ?BUN 15 18 20   --  16 15  ?CREATININE 0.51 0.60 0.56  --  0.59 0.64  ?CALCIUM 8.3* 8.7* 8.3*  --  8.3* 8.4*  ?MG 2.0 1.8 2.3 2.0 1.9 2.1  ?PHOS 3.0 3.2 3.6 3.2 3.1 3.3  ? ? ?GFR: ?Estimated Creatinine Clearance: 32.3 mL/min (by C-G formula based on SCr of 0.64 mg/dL). ?Liver Function Tests: ?Recent Labs  ?Lab 06/04/21 ?0225 06/05/21 ?2025 06/06/21 ?4270 06/07/21 ?0250  ?AST 12* 11* 14* 14*  ?ALT 8 9 10 10   ?ALKPHOS 60 66 70 63  ?BILITOT <0.1* 0.1* 0.4 0.1*  ?PROT 5.1* 5.6* 5.8* 5.6*  ?ALBUMIN 2.7* 3.0* 2.9* 2.8*  ? ? ?No results for input(s): LIPASE, AMYLASE in the last 168  hours. ?No results for input(s): AMMONIA in the last 168 hours. ?Coagulation Profile: ?No results for input(s): INR, PROTIME in the last 168 hours. ?Cardiac Enzymes: ?No results for input(s): CKTOTAL, CKMB, CKMBINDEX, TROPONINI in the last 168 hours. ?BNP (last 3 results) ?No results for input(s): PROBNP in the last 8760 hours. ?HbA1C: ?No results for input(s): HGBA1C in the last 72 hours. ?CBG: ?Recent Labs  ?Lab 06/06/21 ?1128  ?GLUCAP 118*  ? ? ?Lipid Profile: ?No results for input(s): CHOL, HDL, LDLCALC, TRIG, CHOLHDL, LDLDIRECT in the last 72 hours. ?Thyroid Function Tests: ?No results for input(s): TSH, T4TOTAL, FREET4, T3FREE, THYROIDAB in the last 72 hours. ?Anemia Panel: ?No results for input(s): VITAMINB12, FOLATE, FERRITIN, TIBC, IRON, RETICCTPCT in the last 72 hours. ?Sepsis Labs: ?No results for input(s): PROCALCITON, LATICACIDVEN in the last 168 hours. ? ? ?Recent Results (from the past 240 hour(s))  ?Culture, blood (Routine X 2) w Reflex to ID Panel     Status: None  ? Collection Time: 06/01/21  9:07 PM  ?  Specimen: BLOOD  ?Result Value Ref Range Status  ? Specimen Description   Final  ?  BLOOD BLOOD LEFT FOREARM ?Performed at [facility], [place] [address], [place], [place] [address] ?  ? Special R

## 2021-06-10 NOTE — Progress Notes (Signed)
Called report to transfer patient to room 1617. ? ?

## 2021-06-10 NOTE — Progress Notes (Signed)
Patient QA:STMHDQ Samantha Fitzgerald      DOB: [birth year]      QIW:[id number] ? ? ? ?  ?Palliative Medicine Team ? ? ? ?Subjective: Bedside symptom check, no family or visitors present at time of visit.  ? ? ? ?Physical exam: Patient comfortably resting with eyes open at start of visit. Patient maintaining appropriate eye contact and pleasantly conversational. Patient without any concerns or needs at this time. Denies pain or discomfort. Patient very appreciative of all care provided to her.  ? ? ?Assessment and plan: Will continue to be available for any needs or advancements. Bedside RN without any concerns or needs at this time.  ? ? ? ?Thank you for allowing the Palliative Medicine Team to assist in the care of this patient. ?  ?  ?Damian Leavell, MSN, RN ?Palliative Medicine Team ?Team Phone: [phone]  ?This phone is monitored 7a-7p, please reach out to attending physician outside of these hours for urgent needs.   ?

## 2021-06-10 NOTE — TOC Progression Note (Signed)
Transition of Care (TOC) - Progression Note  ? ? ?Patient Details  ?Name: Lillieann Pavlich ?MRN: [id number] ?Date of Birth: 10-09-1924 ? ?Transition of Care (TOC) CM/SW Contact  ?Conn Trombetta, Marjie Skiff, RN ?Phone Number: ?06/10/2021, 12:46 PM ? ?Clinical Narrative:    ?TOC consult to speak with daughter about dc options. Spoke with daughter [name].  Reinforced the discussion she had with CSW Megan on 3/21. TOC in the hospital does not do dc planning for long term care. We can send pt back to [facility] under her remaining Medicare days and they can work with them at [facility] on a long term plan. Kelly from [facility] called to confirm that pt does still have some Medicare days and can return to [facility]. TOC will continue to follow. ? ? ?Expected Discharge Plan: [place] ?Barriers to Discharge: Continued Medical Work up ? ?Expected Discharge Plan and Services ?Expected Discharge Plan: [place] ?  ?  ?  ?Living arrangements for the past 2 months: [place] ?                ?  ?Readmission Risk Interventions ? ?  06/02/2021  ? 10:00 AM 05/09/2021  ? 12:27 PM 02/27/2021  ?  2:13 PM  ?Readmission Risk Prevention Plan  ?Transportation Screening Complete Complete Complete  ?PCP or Specialist Appt within 3-5 Days Complete Complete Not Complete  ?Not Complete comments   Patient will discharge back to [facility].  ?[place] or Home Care Consult Complete Complete Not Complete  ?[place] or Home Care Consult comments   Patient will discharge to SNF.  ?Social Work Consult for [place] Planning/Counseling Complete Complete Complete  ?Palliative Care Screening Complete Complete Not Applicable  ?Medication Review Press photographer) Complete Complete   ? ? ?

## 2021-06-10 NOTE — Progress Notes (Signed)
Nutrition Follow-up ? ?DOCUMENTATION CODES:  ? ?Severe malnutrition in context of chronic illness, Underweight ? ?INTERVENTION:  ?- continue Ensure Plus High Protein BID. ? ? ?NUTRITION DIAGNOSIS:  ? ?Severe Malnutrition related to chronic illness as evidenced by severe fat depletion, severe muscle depletion. -ongoing ? ?GOAL:  ? ?Patient will meet greater than or equal to 90% of their needs -variably met ? ?MONITOR:  ? ?PO intake, Supplement acceptance, Labs, Weight trends, Skin ? ?ASSESSMENT:  ? ?88 y.o. female with medical history of COPD, CHF, PSVT prior history of DVT, osteoporosis, dysphagia, GERD, HTN, anemia, HLD, protein-calorie malnutrition, and malignant neoplasm of bronchus and lung. She presented to the ED due to shortness of breath. She was admitted for acute on chronic respiratory failure with hypoxia. ? ?Limited meal intake documentation indicates she has been eating 25-75% at meals since lunch on 3/20. Ensure was decreased from TID to BID on 3/22 and she has been accepting this supplement 95% of the time offered since then.  ? ?Weight has been stable since 3/19. ? ?Patient transferred from 2W to [place] after RD visit. Her daughter was at the bedside at the time of visit and patient was being cleaned up by [facility]. Daughter denies any nutrition-related questions, concerns, or needs at this time. ? ? ?Labs reviewed; Cl: 97 mmol/l, Ca: 8.4 mg/dl. ?Medications reviewed; 20 mg oral lasix/day. ? ? ?Diet Order:   ?Diet Order   ? ?       ?  Diet regular Room service appropriate? Yes; Fluid consistency: Thin  Diet effective now       ?  ? ?  ?  ? ?  ? ? ?EDUCATION NEEDS:  ? ?No education needs have been identified at this time ? ?Skin:  Skin Assessment: Skin Integrity Issues: ?Skin Integrity Issues:: Stage II, Stage I, Other (Comment) ?Stage I: vertebral column ?Stage II: sacrum ?Other: MASD to bilateral breasts and abdominal skin folds ? ?Last BM:  3/27 (type 6 x1, medium amount) ? ?Height:  ? ?Ht Readings from  Last 1 Encounters:  ?06/06/21 '5\' 6"'  (1.676 m)  ? ? ?Weight:  ? ?Wt Readings from Last 1 Encounters:  ?06/09/21 47.6 kg  ? ? ? ?BMI:  Body mass index is 16.94 kg/m?. ? ?Estimated Nutritional Needs:  ?Kcal:  1400-1600 kcal ?Protein:  70-80 grams ?Fluid:  >/= 1.6 L/day ? ? ? ? ?Jarome Matin, MS, RD, LDN ?Registered Dietitian II ?Inpatient Clinical Nutrition ?RD pager # and on-call/weekend pager # available in [place]  ? ?

## 2021-06-10 NOTE — Progress Notes (Signed)
Patient transferred to room 1617.   ?

## 2021-06-11 DIAGNOSIS — J9601 Acute respiratory failure with hypoxia: Secondary | ICD-10-CM | POA: Diagnosis not present

## 2021-06-11 LAB — PHOSPHORUS: Phosphorus: 2.8 mg/dL (ref 2.5–4.6)

## 2021-06-11 LAB — MAGNESIUM: Magnesium: 1.9 mg/dL (ref 1.7–2.4)

## 2021-06-11 MED ORDER — AMIODARONE HCL 100 MG PO TABS: 200.0000 mg | ORAL_TABLET | Freq: Every day | ORAL | 1 refills | Status: AC

## 2021-06-11 NOTE — Progress Notes (Signed)
Patient Samantha Fitzgerald      DOB: [birth year]      IFB:[id number] ? ? ? ?  ?Palliative Medicine Team ? ? ? ? ?Subjective: Telephone conversation with Daughter, [name]. ? ? ?Assessment and plan: Confirmed with daughter that patient is actively being discharged back to [facility] facility with outpatient palliative services on board. Plan to transition once medicare days are used to outpatient hospice with the same company, [place]. TOC order in place. All questions answered at this time, prepared for hospital discharge.  ? ? ?Thank you for allowing the Palliative Medicine Team to assist in the care of this patient. ?  ?  ?Damian Leavell, MSN, RN ?Palliative Medicine Team ?Team Phone: [phone]  ?This phone is monitored 7a-7p, please reach out to attending physician outside of these hours for urgent needs.   ?

## 2021-06-11 NOTE — TOC Progression Note (Addendum)
Transition of Care (TOC) - Progression Note  ? ? ?Patient Details  ?Name: Samantha Fitzgerald ?MRN: [id number] ?Date of Birth: 1924-05-28 ? ?Transition of Care (TOC) CM/SW Contact  ?Jajaira Ruis, Marjie Skiff, RN ?Phone Number: ?06/11/2021, 9:58 AM ? ?Clinical Narrative:    ?Pt to dc back to [facility]. [facility] to be called for transport. Yellow DNR on the chart for transport. RN to call [phone] for report. ? ?Daughter offered choice for outpatient palliative services at the SNF. [facility] chosen. [facility] liaison contacted for referral. ? ? ?Expected Discharge Plan: [place] ?Barriers to Discharge: Continued Medical Work up ? ?Expected Discharge Plan and Services ?Expected Discharge Plan: [place] ?  ?  ?  ?Living arrangements for the past 2 months: [place] ?Expected Discharge Date: 06/11/21               ?  ?  ?Readmission Risk Interventions ? ?  06/02/2021  ? 10:00 AM 05/09/2021  ? 12:27 PM 02/27/2021  ?  2:13 PM  ?Readmission Risk Prevention Plan  ?Transportation Screening Complete Complete Complete  ?PCP or Specialist Appt within 3-5 Days Complete Complete Not Complete  ?Not Complete comments   Patient will discharge back to [facility].  ?[place] or Home Care Consult Complete Complete Not Complete  ?[place] or Home Care Consult comments   Patient will discharge to SNF.  ?Social Work Consult for [place] Planning/Counseling Complete Complete Complete  ?Palliative Care Screening Complete Complete Not Applicable  ?Medication Review Press photographer) Complete Complete   ? ? ?

## 2021-06-11 NOTE — Discharge Summary (Addendum)
?Physician Discharge Summary ?  ?Patient: Samantha Fitzgerald MRN: [id number] DOB: 07-Mar-1925  ?Admit date:     06/01/2021  ?Discharge date: 06/11/21  ?Discharge Physician: Jerald Kief A Shirleen Mcfaul  ? ?PCP: System, Provider Not In  ? ?Recommendations at discharge:  ? ?Palliative care follow up at [facility] to provide outpatient services ? ?Discharge Diagnoses: ?Principal Problem: ?  Acute respiratory failure with hypoxia ([place]) ?Active Problems: ?  Pleural effusion on left ?  COPD (chronic obstructive pulmonary disease) ([place]) ?  Sick sinus syndrome ([place]) ?  Chronic diastolic CHF (congestive heart failure) ([place]) ?  Acute on chronic respiratory failure with hypoxemia (HCC) ?  History of DVT (deep vein thrombosis) ?  Paroxysmal SVT (supraventricular tachycardia) (HCC) ?  Acute respiratory failure with hypoxemia (HCC) ?  Pulmonary hypertension ([place]) ?  Bilateral pleural effusion ?  Protein-calorie malnutrition, severe ([place]) ?  Decubitus ulcer of sacral region, stage 1 ? ?Resolved Problems: ?  * No resolved hospital problems. * ? ?Hospital Course: ?88 year old with past medical history significant for COPD chronic respiratory failure with hypoxia on 3 L of oxygen at home, malignant neoplasm of the bronchus and lung, pleural effusion, chronic diastolic heart failure, essential hypertension, PSVT, pulmonary hypertension, sick sinus syndrome, history of DVT who was brought to the ER.  She was complaining of worsening shortness of breath.  Evaluation in the ED patient was found to be in SVT with heart rate in the 140.  CT angio of the chest was negative for PE did show bilateral pleural effusion and groundglass opacity concerning for pneumonia. ?  ?For SVT she received a dose of adenosine, which did not resolve the SVT.  Subsequently she was a started on amiodarone drip.  Subsequently transition to flecainide.  ?  ?Cardiology consulted to assist with management of heart failure exacerbation and arrhythmia. ?   ?Patient develops hypotension, SBP in the 60--70, HR 130 A fib. She received IV bolus, SBP increased 95--subsequently she converted to sinus and BP increase 120. She was started on IV amiodarone.  ?  ?Patient has remain stable on oral amiodarone. Plan to transfer to facility 3/28. She will need palliative care follow up.  ?  ? ? ?Assessment and Plan: ?  ?1-SVT/ A flutter  ?Per admission note 3/19 on-call cardiology  Dr. Zigmund Daniel recommended IV adenosine, if not improvement start amiodarone.  ?Patient was started on IV amiodarone, subsequently transition to Flecainide.  ?3/22: Cardiology consulted to assist with arrhythmia. Dr Alba Cory recommend stop flecainide and start amiodarone. Also recommended palliative care consultation.  ?3/23: -Patient develops tachycardia HR 130 A fib--Hypotension, she was SOB. She received IV Bolus. Subsequently BP increased to 95. Patient then sinus converted--SBP increased 120.  ?-Recurrent episode os SVT and hypotension 3/24.  Received IV fluids and amiodarone bolus. With resolution of SVT.  ?-Family discussed with palliative, no IV pressors or further scalation of care.  ?-Transition  to oral Amiodarone 3/26. Discharge on 100 mg po oral amiodarone per cardiology recommendation. Patient at risk for recurrent episodes, she needs follow up with palliative and subsequently will need Hospice care.  ?-no further episodes so far.  ?  ?Acute on chronic hypoxic respiratory failure; secondary to bilateral pleural effusion and heart failure exacerbation ?-Patient also completed 5 days of antibiotics ?-Continue with DuoNeb ?-Continue with lasix.  ?  ?2-COPD exacerbation: Continue with DuoNeb. ?  ?3-Bilateral pleural effusion: Received IV Lasix ?X-ray 3/21 persistent bilateral pleural effusion ?Continue with oral Lasix.Marland Kitchen  ?  ?4-Acute chronic diastolic  heart failure: ?Presented with shortness of breath, bilateral pleural effusion elevated BNP ?Continue with  lasix  ?  ?HTN; allow higher  BP due to  recurrent SVT with hypotension.  ?Severe Malnutrition; continue with ensure.  ? ?    ?Pressure Injury 06/02/21 Sacrum Stage 2 -  Partial thickness loss of dermis presenting as a shallow open injury with a red, pink wound bed without slough. stage 2 with non-blanchable redness (Active)  ?06/02/21 0404  ?Location: Sacrum  ?Location Orientation:   ?Staging: Stage 2 -  Partial thickness loss of dermis presenting as a shallow open injury with a red, pink wound bed without slough.  ?Wound Description (Comments): stage 2 with non-blanchable redness  ?Present on Admission: Yes  ?Dressing Type Foam - Lift dressing to assess site every shift 06/09/21 0800  ?  ?  ?  ?Nutrition Problem: Severe Malnutrition ?Etiology: chronic illness ?  ? ?  ? ? ?Consultants: Cardiology ?Palliative ?Procedures performed:  ?Disposition: Skilled nursing facility ?Diet recommendation:  ?Discharge Diet Orders (From admission, onward)  ? ?  Start     Ordered  ? 06/11/21 0000  Diet - low sodium heart healthy       ? 06/11/21 0847  ? ?  ?  ? ?  ? ?Cardiac diet ?DISCHARGE MEDICATION: ?Allergies as of 06/11/2021   ? ?   Reactions  ? Amoxicillin Nausea Only, Other (See Comments)  ? Listed on MAR  ? Doxycycline Nausea Only, Other (See Comments)  ? Listed on MAR  ? Levofloxacin Nausea Only, Other (See Comments)  ? Not documented on the [facility]  ? Morphine And Related Nausea Only, Other (See Comments)  ? Listed on MAR  ? ?  ? ?  ?Medication List  ?  ? ?STOP taking these medications   ? ?flecainide 50 MG tablet ?Commonly known as: TAMBOCOR ?  ?loperamide 2 MG tablet ?Commonly known as: IMODIUM A-D ?  ?midodrine 10 MG tablet ?Commonly known as: PROAMATINE ?  ?tamsulosin 0.4 MG Caps capsule ?Commonly known as: FLOMAX ?  ? ?  ? ?TAKE these medications   ? ?albuterol 108 (90 Base) MCG/ACT inhaler ?Commonly known as: VENTOLIN HFA ?Inhale 2 puffs into the lungs in the morning and at bedtime. ?What changed: Another medication with the same name was removed. Continue  taking this medication, and follow the directions you see here. ?  ?amiodarone 100 MG tablet ?Commonly known as: PACERONE ?Take 2 tablets (200 mg total) by mouth daily. ?  ?apixaban 5 MG Tabs tablet ?Commonly known as: ELIQUIS ?Take 1 tablet (5 mg total) by mouth 2 (two) times daily. ?What changed: when to take this ?  ?digoxin 0.125 MG tablet ?Commonly known as: LANOXIN ?Take 0.125 mg by mouth daily. Hold and notify MD if HR less than 60 ?  ?Ensure ?Take 237 mLs by mouth 3 (three) times daily between meals. ?  ?furosemide 20 MG tablet ?Commonly known as: LASIX ?Take 20 mg by mouth daily. ?  ?OXYGEN ?Inhale 2 L into the lungs continuous. ?  ?sertraline 25 MG tablet ?Commonly known as: ZOLOFT ?Take 25 mg by mouth daily. ?  ?Trelegy Ellipta 100-62.5-25 MCG/ACT Aepb ?Generic drug: Fluticasone-Umeclidin-Vilant ?Take 1 puff by mouth daily. ?  ? ?  ? ? ?Discharge Exam: ?Filed Weights  ? 06/08/21 0408 06/09/21 0500 06/11/21 0446  ?Weight: 49.1 kg 47.6 kg 54.6 kg  ? ?General; NAD ?Lungs; CTA ?CVS; S 1, S2  RRR ? ?Condition at discharge: stable ? ?The results of  significant diagnostics from this hospitalization (including imaging, microbiology, ancillary and laboratory) are listed below for reference.  ? ?Imaging Studies: ?CT Angio Chest Pulmonary Embolism (PE) W or WO Contrast ? ?Result Date: 06/02/2021 ?CLINICAL DATA:  Pulmonary embolism suspected, high probability. Shortness of breath. EXAM: CT ANGIOGRAPHY CHEST WITH CONTRAST TECHNIQUE: Multidetector CT imaging of the chest was performed using the standard protocol during bolus administration of intravenous contrast. Multiplanar CT image reconstructions and MIPs were obtained to evaluate the vascular anatomy. RADIATION DOSE REDUCTION: This exam was performed according to the departmental dose-optimization program which includes automated exposure control, adjustment of the mA and/or kV according to patient size and/or use of iterative reconstruction technique. CONTRAST:   65mL OMNIPAQUE IOHEXOL 350 MG/ML SOLN COMPARISON:  09/12/2020. FINDINGS: Cardiovascular: The heart is normal in size. Scattered coronary artery calcifications are noted. There is atherosclerotic calcification of

## 2021-06-11 NOTE — Care Management Important Message (Signed)
Important Message ? ?Patient Details IM Letter placed in the Patients room. ?Name: Samantha Fitzgerald ?MRN: [id number] ?Date of Birth: [birth year] ? ? ?Medicare Important Message Given:  Yes ? ? ? ? ?Kerin Salen ?06/11/2021, 9:00 AM ?

## 2021-06-11 NOTE — Progress Notes (Signed)
[id number] Hospital Liaison Note ? ?Notified by [facility] Manager Marney Doctor, RN of patient/family request for [facility] Palliative services at [facility] after discharge. ? ?Please call with any questions or concerns. ? ?Thank you, ?Margaretmary Eddy, BSN, RN ?[facility] Liaison ?[phone] ?

## 2021-06-14 ENCOUNTER — Non-Acute Institutional Stay: Payer: Medicare Other | Admitting: Hospice

## 2021-06-14 DIAGNOSIS — R531 Weakness: Secondary | ICD-10-CM

## 2021-06-14 DIAGNOSIS — J441 Chronic obstructive pulmonary disease with (acute) exacerbation: Secondary | ICD-10-CM

## 2021-06-14 DIAGNOSIS — E43 Unspecified severe protein-calorie malnutrition: Secondary | ICD-10-CM

## 2021-06-14 DIAGNOSIS — I5032 Chronic diastolic (congestive) heart failure: Secondary | ICD-10-CM

## 2021-06-14 DIAGNOSIS — Z515 Encounter for palliative care: Secondary | ICD-10-CM

## 2021-06-14 NOTE — Progress Notes (Signed)
? ? ?Manufacturing engineer ?Community Palliative Care Consult Note ?Telephone: [phone]  ?Fax: [phone] ? ?PATIENT NAME: Samantha Fitzgerald ?[place] [address] ?[phone] (home)  ?DOB: November 16, 1924 ?MRN: [id number] ? ?PRIMARY CARE PROVIDER:    ?Mayer Camel NP ?REFERRING PROVIDER: Mayer Camel NP ?RESPONSIBLE PARTY:  Self/Sheila  ?Contact Information   ? ? Name Relation Home Work Mobile  ? McNeil,Sheilagh Daughter   [phone]  ? ?  ? ? ? ?I met face to face with patient and family at facility. Visit to build trust and highlight Palliative Medicine as specialized medical care for people living with serious illness, aimed at facilitating better quality of life through symptoms relief, assisting with advance care planning and complex medical decision making. [name] is with patient during visit; they endorsed palliative service. This is the initial visit. ?ASSESSMENT AND / RECOMMENDATIONS:  ? ?Advance Care Planning: Our advance care planning conversation included a discussion about:    ?The value and importance of advance care planning  ?Difference between Hospice and Palliative care ?Exploration of goals of care in the event of a sudden injury or illness  ?Identification and preparation of a healthcare agent  ?Review and updating or creation of an  advance directive document . ?Decision not to resuscitate or to de-escalate disease focused treatments due to poor prognosis. ? ?CODE STATUS: Patient and [name] affirmed patient is a Do Not Resuscitate.  ? ?Goals of Care: Goals include to maximize quality of life and symptom management ? ?I spent 16  minutes providing this initial consultation. More than 50% of the time in this consultation was spent on counseling patient and coordinating communication. ?-------------------------------------------------------------------------------------------------------------------------------------- ? ?Symptom Management/Plan: ?COPD:  exacerbated by recent pneumonia for which she was hospitalized and treated, discharged to SNF for acute rehab ?Continue oxygen supplementation, Trelegy and Albuterol breathing treatments as ordered. Slow deep breathing encouraged, avoid triggers.  ?CHF: Managed with Lasix. Elevate BLE during the day as much as possible to promote circulation. Adhere to Fluid and salt limits. Monitor weight closely and report weight gain of 2 Ibs in a day or 5 Ibs in a week.  ?Weakness: PT/OT is ongoing for strengthening and gait training.  ?Protein Caloric Malnutrition: with ongoing weight loss. Height/weight - 5 feet 6 inches/102Ib. Albumin 2.8 06/07/21. Continue Ensure. Provide assistance during meals to ensure adequate oral intake.  ? ?Follow up: Palliative care will continue to follow for complex medical decision making, advance care planning, and clarification of goals. Return 6 weeks or prn. Encouraged to call provider sooner with any concerns.  ? ?Family /Caregiver/Community Supports: Patient in SNF for ongoing care ? ?HOSPICE ELIGIBILITY/DIAGNOSIS: TBD ? ?Chief Complaint: Initial Palliative care visit ? ?HISTORY OF PRESENT ILLNESS:  Samantha Fitzgerald is a 88 y.o. year old female  with multiple morbidities requiring close monitoring and with high risk of complications and  mortality:  COPD with exacerbation with community acquired pneumonia, PCM CHF. Patient endorses shortness of breath with exertion; oxygen supplementation is helpful. She denies pain/discomfort.  ?History obtained from review of EMR, discussion with primary team, caregiver, family and/or Ms. Larocca.  ?Review and summarization of Epic records shows history from other than patient. Rest of 10 point ROS asked and negative. Independent interpretation of tests and reviewed as needed, available labs, patient records, imaging, studies and related documents from the EMR. ? ?Recent Labs  ?Lab 06/09/21 ?0253  ?WBC 5.1  ?HGB 8.7*  ?HCT 27.4*  ?PLT 347  ?MCV 92.6   ? ?Recent  Labs  ?Lab 06/09/21 ?0253 06/10/21 ?0259  ?NA 134* 136  ?K 3.1* 4.4  ?CL 91* 97*  ?CO2 36* 34*  ?BUN 16 15  ?CREATININE 0.59 0.64  ?GLUCOSE 74 76  ? ? ?Physical Exam: ?Height/weight - 5 feet 6 inches/102Ib.  ?Constitutional: NAD ?General: Well groomed, cooperative ?EYES: anicteric sclera, lids intact, no discharge  ?ENMT: Moist mucous membrane ?CV: S1 S2, RRR, no LE edema ?Pulmonary: LCTA, no increased work of breathing, no cough, ?Abdomen: active BS + 4 quadrants, soft and non tender ?GU: no suprapubic tenderness ?MSK: weakness, sarcopenia, limited ROM ?Skin: warm and dry, no rashes or wounds on visible skin ?Neuro:  weakness, otherwise non focal ?Psych: non-anxious affect ?Hem/lymph/immuno: no widespread bruising ? ? ?PAST MEDICAL HISTORY:  ?Active Ambulatory Problems  ?  Diagnosis Date Noted  ? Pleural effusion on left 09/12/2020  ? COPD (chronic obstructive pulmonary disease) ([place]) 09/12/2020  ? Sick sinus syndrome ([place]) 09/12/2020  ? Hypoxia 09/12/2020  ? Sepsis ([place]) 12/03/2020  ? COVID-19 virus infection 12/03/2020  ? Acute lower UTI 12/03/2020  ? Cellulitis 12/03/2020  ? Chronic diastolic CHF (congestive heart failure) ([place]) 12/03/2020  ? Hypoalbuminemia 12/03/2020  ? AKI (acute kidney injury) ([place]) 12/03/2020  ? Bacteremia   ? Palliative care by specialist   ? Goals of care, counseling/discussion   ? General weakness   ? Acute on chronic respiratory failure with hypoxemia ([place]) 02/26/2021  ? Pressure injury of skin 02/27/2021  ? Acute on chronic respiratory failure with hypoxia ([place]) 05/08/2021  ? COPD with acute exacerbation ([place]) 05/08/2021  ? History of DVT (deep vein thrombosis) 05/08/2021  ? Acute respiratory failure with hypoxia ([place]) 06/01/2021  ? Paroxysmal SVT (supraventricular tachycardia) ([place]) 06/02/2021  ? Acute respiratory failure with hypoxemia ([place]) 06/02/2021  ? Pulmonary hypertension ([place]) 06/02/2021  ? Bilateral pleural effusion 06/02/2021  ? Protein-calorie malnutrition,  severe ([place]) 06/02/2021  ? Decubitus ulcer of sacral region, stage 1 06/04/2021  ? ?Resolved Ambulatory Problems  ?  Diagnosis Date Noted  ? No Resolved Ambulatory Problems  ? ?Past Medical History:  ?Diagnosis Date  ? Anemia   ? CHF (congestive heart failure) ([place])   ? 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 Dysphagia   ? GERD without esophagitis   ? Hyperlipidemia   ? Hypokalemia   ? Hypomagnesemia   ? Malignant neoplasm of bronchus and lung ([place])   ? Osteoporosis   ? Pleural effusion   ? Pneumonia   ? Respiratory failure ([place])   ? Retention of urine   ? Unspecified protein-calorie malnutrition ([place])   ? Ventricular premature depolarization   ? ? ?SOCIAL HX:  ?Social History  ? ?Tobacco Use  ? Smoking status: Former  ?  Packs/day: 1.00  ?  Years: 25.00  ?  Pack years: 25.00  ?  Types: Cigarettes  ?  Quit date: 10/01/1963  ?  Years since quitting: 37.7  ?  Passive exposure: Never  ? Smokeless tobacco: Never  ?Substance Use Topics  ? Alcohol use: Never  ? ?  ?FAMILY HX:  ?Family History  ?Problem Relation Age of Onset  ? Hypertension Other   ?   ? ?ALLERGIES:  ?Allergies  ?Allergen Reactions  ? Amoxicillin Nausea Only and Other (See Comments)  ?  Listed on MAR  ? Doxycycline Nausea Only and Other (See Comments)  ?  Listed on MAR  ? Levofloxacin Nausea Only and Other (See Comments)  ?  Not documented on  the [facility]  ? Morphine And Related Nausea Only and Other (See Comments)  ?  Listed on MAR  ?   ? ?PERTINENT MEDICATIONS:  ?Outpatient Encounter Medications as of 06/14/2021  ?Medication Sig  ? albuterol (VENTOLIN HFA) 108 (90 Base) MCG/ACT inhaler Inhale 2 puffs into the lungs in the morning and at bedtime.  ? amiodarone (PACERONE) 100 MG tablet Take 2 tablets (200 mg total) by mouth daily.  ? apixaban (ELIQUIS) 5 MG TABS tablet Take 1 tablet (5 mg total) by mouth 2 (two) times daily. (Patient taking differently: Take 5 mg by mouth every 12 (twelve) hours.)  ? digoxin (LANOXIN) 0.125 MG tablet Take  0.125 mg by mouth daily. Hold and notify MD if HR less than 60  ? Ensure (ENSURE) Take 237 mLs by mouth 3 (three) times daily between meals.  ? furosemide (LASIX) 20 MG tablet Take 20 mg by mouth daily.  ? OXYGEN I

## 2021-07-04 ENCOUNTER — Non-Acute Institutional Stay (SKILLED_NURSING_FACILITY): Payer: Medicare Other | Admitting: Internal Medicine

## 2021-07-04 ENCOUNTER — Encounter: Payer: Self-pay | Admitting: Internal Medicine

## 2021-07-04 DIAGNOSIS — J411 Mucopurulent chronic bronchitis: Secondary | ICD-10-CM

## 2021-07-04 DIAGNOSIS — I5032 Chronic diastolic (congestive) heart failure: Secondary | ICD-10-CM

## 2021-07-04 DIAGNOSIS — J90 Pleural effusion, not elsewhere classified: Secondary | ICD-10-CM

## 2021-07-04 DIAGNOSIS — F32A Depression, unspecified: Secondary | ICD-10-CM

## 2021-07-04 DIAGNOSIS — Z86718 Personal history of other venous thrombosis and embolism: Secondary | ICD-10-CM

## 2021-07-04 DIAGNOSIS — R4189 Other symptoms and signs involving cognitive functions and awareness: Secondary | ICD-10-CM

## 2021-07-04 DIAGNOSIS — I471 Supraventricular tachycardia: Secondary | ICD-10-CM | POA: Diagnosis not present

## 2021-07-04 NOTE — Progress Notes (Signed)
?Provider:  Veleta Miners MD ?Location:   [place] Room Number: 28 ?Place of Service:  SNF (31) ? ?PCP: System, Provider Not In ?Patient Care Team: ?System, Provider Not In as PCP - General ?Darcus Austin ? ?Extended Emergency Contact Information ?Primary Emergency Contact: McNeil,Sheilagh ?Mobile Phone: [phone] ?Relation: Daughter ? ?Code Status: DNR Palliative Care ?Goals of Care: Advanced Directive information ? ?  07/04/2021  ? 12:37 PM  ?Advanced Directives  ?Does Patient Have a Medical Advance Directive? Yes  ?Type of Advance Directive Out of facility DNR (pink MOST or yellow form);Living will;Healthcare Power of Attorney  ?Does patient want to make changes to medical advance directive? No - Patient declined  ?Copy of [place] in Chart? Yes - validated most recent copy scanned in chart (See row information)  ? ? ? ? ?Chief Complaint  ?Patient presents with  ? New Admit To SNF  ?  Admission to SNF  ? ? ?HPI: Patient is a 88 y.o. female seen today for admission to SNF for long term care ? ?Patient has a history of ?COPD on chronic oxygen ?History of malignant neoplasm of bronchus and lung s/p Lobectomy, ? history of chronic diastolic CHF, hypertension, PSVT and history of DVT ?H/o Left Femur Fracture in 06/22 ?Cognitive impermanent ? ?Has had number of admissions to the hospital for her COPD and recent admission for respiratory failure due to pneumonia ?During that admission patient was started on amiodarone for SVT and taken off Flecainide. ? ?Patient is a transfer from another nursing facility. ?Her only complaint today was some nausea but no vomiting or abdominal pain. ?She was confused and did not exactly knew where she was. ?Denied any cough or shortness of breath. ? ?Patient is Mod Assist for transfers. Need help with ADLS ?Can feed herself ? ?Past Medical History:  ?Diagnosis Date  ? Anemia   ? CHF (congestive heart failure) ([place])   ? Chronic embolism and  thrombosis of right femoral vein (HCC)   ? COPD (chronic obstructive pulmonary disease) ([place])   ? Dysphagia   ? GERD without esophagitis   ? Hyperlipidemia   ? Hypokalemia   ? Hypomagnesemia   ? Malignant neoplasm of bronchus and lung ([place])   ? Osteoporosis   ? Pleural effusion   ? Pneumonia   ? Pulmonary hypertension ([place])   ? Respiratory failure ([place])   ? Retention of urine   ? Sick sinus syndrome ([place])   ? Unspecified protein-calorie malnutrition ([place])   ? Ventricular premature depolarization   ? ?History reviewed. No pertinent surgical history. ? reports that she quit smoking about 57 years ago. Her smoking use included cigarettes. She has a 25.00 pack-year smoking history. She has never been exposed to tobacco smoke. She has never used smokeless tobacco. She reports that she does not drink alcohol and does not use drugs. ?Social History  ? ?Socioeconomic History  ? Marital status: Widowed  ?  Spouse name: Not on file  ? Number of children: Not on file  ? Years of education: Not on file  ? Highest education level: Not on file  ?Occupational History  ? Not on file  ?Tobacco Use  ? Smoking status: Former  ?  Packs/day: 1.00  ?  Years: 25.00  ?  Pack years: 25.00  ?  Types: Cigarettes  ?  Quit date: 10/01/1963  ?  Years since quitting: 32.7  ?  Passive exposure: Never  ? Smokeless tobacco: Never  ?Vaping  Use  ? Vaping Use: Never used  ?Substance and Sexual Activity  ? Alcohol use: Never  ? Drug use: Never  ? Sexual activity: Not on file  ?Other Topics Concern  ? Not on file  ?Social History Narrative  ? Not on file  ? ?Social Determinants of Health  ? ?Financial Resource Strain: Not on file  ?Food Insecurity: Not on file  ?Transportation Needs: Not on file  ?Physical Activity: Not on file  ?Stress: Not on file  ?Social Connections: Not on file  ?Intimate Partner Violence: Not on file  ? ? ?Functional Status Survey: ?  ? ?Family History  ?Problem Relation Age of Onset  ? Hypertension Other   ? ? ?Health Maintenance   ?Topic Date Due  ? TETANUS/TDAP  Never done  ? Zoster Vaccines- Shingrix (1 of 2) Never done  ? DEXA SCAN  Never done  ? Pneumonia Vaccine 29+ Years old (2 - PPSV23 if available, else PCV20) 01/16/2016  ? COVID-19 Vaccine (2 - Booster for Janssen series) 08/11/2019  ? INFLUENZA VACCINE  10/15/2021  ? HPV VACCINES  Aged Out  ? ? ?Allergies  ?Allergen Reactions  ? Amoxicillin Nausea Only and Other (See Comments)  ?  Listed on MAR  ? Doxycycline Nausea Only and Other (See Comments)  ?  Listed on MAR  ? Levofloxacin Nausea Only and Other (See Comments)  ?  Not documented on the [facility]  ? Morphine And Related Nausea Only and Other (See Comments)  ?  Listed on MAR  ? ? ?Allergies as of 07/04/2021   ? ?   Reactions  ? Amoxicillin Nausea Only, Other (See Comments)  ? Listed on MAR  ? Doxycycline Nausea Only, Other (See Comments)  ? Listed on MAR  ? Levofloxacin Nausea Only, Other (See Comments)  ? Not documented on the [facility]  ? Morphine And Related Nausea Only, Other (See Comments)  ? Listed on MAR  ? ?  ? ?  ?Medication List  ?  ? ?  ? Accurate as of July 04, 2021 12:38 PM. If you have any questions, ask your nurse or doctor.  ?  ?  ? ?  ? ?albuterol 108 (90 Base) MCG/ACT inhaler ?Commonly known as: VENTOLIN HFA ?Inhale 2 puffs into the lungs in the morning and at bedtime. ?  ?amiodarone 100 MG tablet ?Commonly known as: PACERONE ?Take 2 tablets (200 mg total) by mouth daily. ?  ?digoxin 0.125 MG tablet ?Commonly known as: LANOXIN ?Take 0.125 mg by mouth daily. Hold and notify MD if HR less than 60 ?  ?Eliquis 5 MG Tabs tablet ?Generic drug: apixaban ?Take 5 mg by mouth 2 (two) times daily. ?What changed: Another medication with the same name was removed. Continue taking this medication, and follow the directions you see here. ?Changed by: Virgie Dad, MD ?  ?Ensure ?Take 237 mLs by mouth 3 (three) times daily between meals. ?  ?furosemide 20 MG tablet ?Commonly known as: LASIX ?Take 20 mg by mouth daily. ?  ?OXYGEN ?Inhale  2 L into the lungs continuous. ?  ?sertraline 25 MG tablet ?Commonly known as: ZOLOFT ?Take 25 mg by mouth daily. ?  ?Trelegy Ellipta 100-62.5-25 MCG/ACT Aepb ?Generic drug: Fluticasone-Umeclidin-Vilant ?Take 1 puff by mouth daily. ?  ?zinc oxide 20 % ointment ?Apply 1 application. topically as needed for irritation. ?  ? ?  ? ? ?Review of Systems  ?Constitutional:  Positive for activity change. Negative for appetite change.  ?HENT: Negative.    ?  Respiratory:  Negative for cough and shortness of breath.   ?Cardiovascular:  Negative for leg swelling.  ?Gastrointestinal:  Positive for nausea. Negative for constipation.  ?Genitourinary: Negative.   ?Musculoskeletal:  Positive for gait problem. Negative for arthralgias and myalgias.  ?Skin: Negative.   ?Neurological:  Positive for weakness. Negative for dizziness.  ?Psychiatric/Behavioral:  Positive for confusion. Negative for dysphoric mood and sleep disturbance.   ? ?Vitals:  ? 07/04/21 1227  ?BP: (!) 141/67  ?Pulse: 72  ?Resp: 18  ?Temp: 97.8 ?F (36.6 ?C)  ?SpO2: 97%  ?Weight: 90 lb 9.6 oz (41.1 kg)  ?Height: 5\' 6"  (1.676 m)  ? ?Body mass index is 14.62 kg/m?Marland Kitchen ?Physical Exam ?Vitals reviewed.  ?Constitutional:   ?   Appearance: Normal appearance.  ?HENT:  ?   Head: Normocephalic.  ?   Nose: Nose normal.  ?   Mouth/Throat:  ?   Mouth: Mucous membranes are moist.  ?   Pharynx: Oropharynx is clear.  ?Eyes:  ?   Pupils: Pupils are equal, round, and reactive to light.  ?Cardiovascular:  ?   Rate and Rhythm: Normal rate and regular rhythm.  ?   Pulses: Normal pulses.  ?   Heart sounds: Normal heart sounds. No murmur heard. ?Pulmonary:  ?   Effort: Pulmonary effort is normal.  ?   Breath sounds: Normal breath sounds. No wheezing or rales.  ?Abdominal:  ?   General: Abdomen is flat. Bowel sounds are normal.  ?   Palpations: Abdomen is soft.  ?Musculoskeletal:     ?   General: No swelling.  ?   Cervical back: Neck supple.  ?Skin: ?   General: Skin is warm.  ?Neurological:  ?    General: No focal deficit present.  ?   Mental Status: She is alert.  ?   Comments: Did not know where she is or city.Seemed oriented to herself  ?Psychiatric:     ?   Mood and Affect: Mood normal.

## 2021-07-08 LAB — HEPATIC FUNCTION PANEL
ALT: 9 U/L (ref 7–35)
AST: 12 — AB (ref 13–35)
Alkaline Phosphatase: 57 (ref 25–125)
Bilirubin, Total: 0.4

## 2021-07-08 LAB — CBC AND DIFFERENTIAL
HCT: 32 — AB (ref 36–46)
Hemoglobin: 10.3 — AB (ref 12.0–16.0)
Platelets: 223 10*3/uL (ref 150–400)
WBC: 5.3

## 2021-07-08 LAB — BASIC METABOLIC PANEL
BUN: 16 (ref 4–21)
CO2: 39 — AB (ref 13–22)
Chloride: 93 — AB (ref 99–108)
Creatinine: 0.6 (ref 0.5–1.1)
Glucose: 69
Potassium: 4.4 mEq/L (ref 3.5–5.1)
Sodium: 137 (ref 137–147)

## 2021-07-08 LAB — COMPREHENSIVE METABOLIC PANEL
Albumin: 3.1 — AB (ref 3.5–5.0)
Calcium: 8.8 (ref 8.7–10.7)
Globulin: 2.4

## 2021-07-08 LAB — CBC: RBC: 3.45 — AB (ref 3.87–5.11)

## 2021-07-15 ENCOUNTER — Non-Acute Institutional Stay (SKILLED_NURSING_FACILITY): Payer: Medicare Other | Admitting: Orthopedic Surgery

## 2021-07-15 ENCOUNTER — Encounter: Payer: Self-pay | Admitting: Orthopedic Surgery

## 2021-07-15 DIAGNOSIS — S82102A Unspecified fracture of upper end of left tibia, initial encounter for closed fracture: Secondary | ICD-10-CM | POA: Diagnosis not present

## 2021-07-15 DIAGNOSIS — F339 Major depressive disorder, recurrent, unspecified: Secondary | ICD-10-CM

## 2021-07-15 DIAGNOSIS — I471 Supraventricular tachycardia: Secondary | ICD-10-CM | POA: Diagnosis not present

## 2021-07-15 DIAGNOSIS — I5032 Chronic diastolic (congestive) heart failure: Secondary | ICD-10-CM

## 2021-07-15 DIAGNOSIS — J411 Mucopurulent chronic bronchitis: Secondary | ICD-10-CM | POA: Diagnosis not present

## 2021-07-15 DIAGNOSIS — Z86718 Personal history of other venous thrombosis and embolism: Secondary | ICD-10-CM

## 2021-07-15 DIAGNOSIS — J90 Pleural effusion, not elsewhere classified: Secondary | ICD-10-CM

## 2021-07-15 DIAGNOSIS — K219 Gastro-esophageal reflux disease without esophagitis: Secondary | ICD-10-CM

## 2021-07-15 DIAGNOSIS — R4189 Other symptoms and signs involving cognitive functions and awareness: Secondary | ICD-10-CM

## 2021-07-15 MED ORDER — HYDROCODONE-ACETAMINOPHEN 5-325 MG PO TABS: 1.0000 | ORAL_TABLET | Freq: Two times a day (BID) | ORAL | 0 refills | Status: DC | PRN

## 2021-07-15 NOTE — Progress Notes (Signed)
?Location:   [place] Room Number: 28-A ?Place of Service:  SNF (31) ?Provider:  Windell Moulding, NP ? ? ? ?Patient Care Team: ?Virgie Dad, MD as PCP - General (Internal Medicine) ?Darcus Austin ? ?Extended Emergency Contact Information ?Primary Emergency Contact: McNeil,Sheilagh ?Mobile Phone: [phone] ?Relation: Daughter ? ?Code Status:  DNR ?Goals of care: Advanced Directive information ? ?  07/15/2021  ? 10:21 AM  ?Advanced Directives  ?Does Patient Have a Medical Advance Directive? Yes  ?Type of Paramedic of [place];Living will;Out of facility DNR (pink MOST or yellow form)  ?Does patient want to make changes to medical advance directive? No - Patient declined  ?Copy of [place] in Chart? Yes - validated most recent copy scanned in chart (See row information)  ? ? ? ?Chief Complaint  ?Patient presents with  ? Acute Visit  ?  Left tibia fracture  ? ? ?HPI:  ?Pt is a 88 y.o. female seen today for an acute visit for  ? ? ?Past Medical History:  ?Diagnosis Date  ? Anemia   ? CHF (congestive heart failure) ([place])   ? 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 COPD (chronic obstructive pulmonary disease) ([place])   ? Dysphagia   ? GERD without esophagitis   ? Hyperlipidemia   ? Hypokalemia   ? Hypomagnesemia   ? Malignant neoplasm of bronchus and lung ([place])   ? Osteoporosis   ? Pleural effusion   ? Pneumonia   ? Pulmonary hypertension ([place])   ? Respiratory failure ([place])   ? Retention of urine   ? Sick sinus syndrome ([place])   ? Unspecified protein-calorie malnutrition ([place])   ? Ventricular premature depolarization   ? ?No past surgical history on file. ? ?Allergies  ?Allergen Reactions  ? Amoxicillin Nausea Only and Other (See Comments)  ?  Listed on MAR  ? Doxycycline Nausea Only and Other (See Comments)  ?  Listed on MAR  ? Levofloxacin Nausea Only and Other (See Comments)  ?  Not documented on the [facility]  ? Morphine And Related  Nausea Only and Other (See Comments)  ?  Listed on MAR  ? ? ?Allergies as of 07/15/2021   ? ?   Reactions  ? Amoxicillin Nausea Only, Other (See Comments)  ? Listed on MAR  ? Doxycycline Nausea Only, Other (See Comments)  ? Listed on MAR  ? Levofloxacin Nausea Only, Other (See Comments)  ? Not documented on the [facility]  ? Morphine And Related Nausea Only, Other (See Comments)  ? Listed on MAR  ? ?  ? ?  ?Medication List  ?  ? ?  ? Accurate as of Jul 15, 2021 10:22 AM. If you have any questions, ask your nurse or doctor.  ?  ?  ? ?  ? ?acetaminophen 325 MG tablet ?Commonly known as: TYLENOL ?Take 650 mg by mouth every 4 (four) hours as needed. ?  ?albuterol 108 (90 Base) MCG/ACT inhaler ?Commonly known as: VENTOLIN HFA ?Inhale 2 puffs into the lungs in the morning and at bedtime. ?  ?amiodarone 100 MG tablet ?Commonly known as: PACERONE ?Take 2 tablets (200 mg total) by mouth daily. ?  ?digoxin 0.125 MG tablet ?Commonly known as: LANOXIN ?Take 0.125 mg by mouth daily. Hold and notify MD if HR less than 60 ?  ?Eliquis 2.5 MG Tabs tablet ?Generic drug: apixaban ?Take 2.5 mg by mouth 2 (two) times daily. ?  What changed: Another medication with the same name was removed. Continue taking this medication, and follow the directions you see here. ?Changed by: Yvonna Alanis, NP ?  ?furosemide 20 MG tablet ?Commonly known as: LASIX ?Take 20 mg by mouth every morning. ?  ?lactose free nutrition Liqd ?Take 237 mLs by mouth in the morning and at bedtime. ?What changed: Another medication with the same name was removed. Continue taking this medication, and follow the directions you see here. ?Changed by: Yvonna Alanis, NP ?  ?omeprazole 20 MG capsule ?Commonly known as: PRILOSEC ?Take 20 mg by mouth daily. ?  ?OXYGEN ?Inhale 3.5 L into the lungs continuous. ?  ?sertraline 25 MG tablet ?Commonly known as: ZOLOFT ?Take 25 mg by mouth daily. ?  ?Trelegy Ellipta 100-62.5-25 MCG/ACT Aepb ?Generic drug: Fluticasone-Umeclidin-Vilant ?Take 1 puff by  mouth every morning. ?  ?zinc oxide 20 % ointment ?Apply 1 application. topically as needed for irritation. ?  ? ?  ? ? ?Review of Systems ? ?Immunization History  ?Administered Date(s) Administered  ? Influenza-Unspecified 11/29/2020  ? Janssen (J&J) SARS-COV-2 Vaccination 06/16/2019  ? Pneumococcal Conjugate-13 01/16/2015  ? ?Pertinent  Health Maintenance Due  ?Topic Date Due  ? DEXA SCAN  Never done  ? INFLUENZA VACCINE  10/15/2021  ? ? ?  06/09/2021  ? 12:00 PM 06/10/2021  ?  2:00 AM 06/10/2021  ? 11:00 AM 06/10/2021  ?  9:02 PM 06/11/2021  ?  8:05 AM  ?Fall Risk  ?Patient Fall Risk Level High fall risk High fall risk High fall risk High fall risk High fall risk  ? ?Functional Status Survey: ?  ? ?Vitals:  ? 07/15/21 1005  ?BP: (!) 158/71  ?Pulse: 78  ?Resp: 19  ?Temp: (!) 97.5 ?F (36.4 ?C)  ?SpO2: 90%  ?Weight: 90 lb 9.6 oz (41.1 kg)  ?Height: 5\' 6"  (1.676 m)  ? ?Body mass index is 14.62 kg/m?Marland Kitchen ?Physical Exam ? ?Labs reviewed: ?Recent Labs  ?  06/07/21 ?0250 06/08/21 ?0305 06/09/21 ?0253 06/10/21 ?0259 06/11/21 ?0422 07/08/21 ?0000  ?NA 134*  --  134* 136  --  137  ?K 3.8  --  3.1* 4.4  --  4.4  ?CL 92*  --  91* 97*  --  93*  ?CO2 37*  --  36* 34*  --  39*  ?GLUCOSE 85  --  74 76  --   --   ?BUN 20  --  16 15  --  16  ?CREATININE 0.56  --  0.59 0.64  --  0.6  ?CALCIUM 8.3*  --  8.3* 8.4*  --  8.8  ?MG 2.3   < > 1.9 2.1 1.9  --   ?PHOS 3.6   < > 3.1 3.3 2.8  --   ? < > = values in this interval not displayed.  ? ?Recent Labs  ?  06/05/21 ?0252 06/06/21 ?0333 06/07/21 ?0250 07/08/21 ?0000  ?AST 11* 14* 14* 12*  ?ALT 9 10 10 9   ?ALKPHOS 66 70 63 57  ?BILITOT 0.1* 0.4 0.1*  --   ?PROT 5.6* 5.8* 5.6*  --   ?ALBUMIN 3.0* 2.9* 2.8* 3.1*  ? ?Recent Labs  ?  06/05/21 ?0252 06/06/21 ?0333 06/07/21 ?0250 06/09/21 ?0253 07/08/21 ?0000  ?WBC 7.9 6.2 5.1 5.1 5.3  ?NEUTROABS 5.4 3.9 2.8  --   --   ?HGB 9.0* 9.4* 9.3* 8.7* 10.3*  ?HCT 28.7* 30.5* 29.7* 27.4* 32*  ?MCV 94.7 93.8 93.1 92.6  --   ?  PLT 423* 406* 397 347 223  ? ?Lab  Results  ?Component Value Date  ? TSH 3.692 06/02/2021  ? ?No results found for: HGBA1C ?No results found for: CHOL, HDL, LDLCALC, LDLDIRECT, TRIG, CHOLHDL ? ?Significant Diagnostic Results in last 30 days:  ?No results found. ? ?Assessment/Plan ?There are no diagnoses linked to this encounter. ? ? ?Family/ staff Communication:  ? ?Labs/tests ordered:   ? ?

## 2021-07-15 NOTE — Progress Notes (Signed)
?Location:  [place] Room Number: 28-A ?Place of Service:  SNF (31) ?Provider:  Yvonna Alanis, NP  ? ?Samantha Dad, MD ? ?Patient Care Team: ?Samantha Dad, MD as PCP - General (Internal Medicine) ?Darcus Austin ? ?Extended Emergency Contact Information ?Primary Emergency Contact: McNeil,Sheilagh ?Mobile Phone: [phone] ?Relation: Daughter ? ?Code Status:  DNR ?Goals of care: Advanced Directive information ? ?  07/04/2021  ? 12:37 PM  ?Advanced Directives  ?Does Patient Have a Medical Advance Directive? Yes  ?Type of Advance Directive Out of facility DNR (pink MOST or yellow form);Living will;Healthcare Power of Attorney  ?Does patient want to make changes to medical advance directive? No - Patient declined  ?Copy of [place] in Chart? Yes - validated most recent copy scanned in chart (See row information)  ? ? ? ?Chief Complaint  ?Patient presents with  ? Acute Visit  ?  Left tibia fracture  ? ? ?HPI:  ?Pt is a 88 y.o. female seen today for acute visit due to left tibia fracture.  ? ?She currently resides on the skilled nursing unit at [facility]. PMH: SSS, SVT/PAF, CHF, pulmonary HTN, COPD, chronic respiratory failure, chronic bronchitis, neoplasm of bronchus s/p lobectomy, h/o DVT, left femur fracture 08/2020, and cognitive impairment.  ? ?04/30 she was found on the bedroom floor by nursing staff. She reported increased left knee pain after event. Unable to bear weight on leg after incident. Xray left knee revealed acute fracture of the proximal tibia (avulsion injury to tibial tubercle with displacement). Daughter/HPOA did not want her sent to ED. Today, she denies pain while sitting in bed. She does not recall falling. She was able to transfer 1 person prior to event, but not at this time. Left leg is internally rotated, daughter reports this began after left femur fracture in 08/2020. Treatment options discussed with daughter, does not want future  hospitalizations, not interested in seeing orthopedics. We discussed hospice due to overall declining health. She would like to discuss with sister first.  ? ?SVT/PAF- HR controlled with  amiodarone and Digoxin, Digoxin level 1.7 06/02/2021, TSH 3.692 06/02/2021, remains on Eliquis for clot prevention ?Chronic bronchitis/COPD- 3-4LNC, h/o smoking, remains on Trelegy ?CHF- LVEF 60-65% 06/03/2021, remains on furosemide ?H/o DVT- Eliquis recently decreased to 2.5 mg for maintenance ?Pleural effusion- 06/01/2021 CT chest moderate pleural effusion to right ?Depression- no mood changes, remains on Zoloft ?GERD- hgb 10.3 07/08/2021, remains on omeprazole ?Cognitive impairment- MRI brain with moderate diffuse atrophy and chronic small vessel disease 11/2020 ? ?Past Medical History:  ?Diagnosis Date  ? Anemia   ? CHF (congestive heart failure) ([place])   ? 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 COPD (chronic obstructive pulmonary disease) ([place])   ? Dysphagia   ? GERD without esophagitis   ? Hyperlipidemia   ? Hypokalemia   ? Hypomagnesemia   ? Malignant neoplasm of bronchus and lung ([place])   ? Osteoporosis   ? Pleural effusion   ? Pneumonia   ? Pulmonary hypertension ([place])   ? Respiratory failure ([place])   ? Retention of urine   ? Sick sinus syndrome ([place])   ? Unspecified protein-calorie malnutrition ([place])   ? Ventricular premature depolarization   ? ?No past surgical history on file. ? ?Allergies  ?Allergen Reactions  ? Amoxicillin Nausea Only and Other (See Comments)  ?  Listed on MAR  ? Doxycycline Nausea Only and Other (See Comments)  ?  Listed on MAR  ? Levofloxacin Nausea Only and Other (See Comments)  ?  Not documented on the [facility]  ? Morphine And Related Nausea Only and Other (See Comments)  ?  Listed on MAR  ? ? ?Outpatient Encounter Medications as of 07/15/2021  ?Medication Sig  ? albuterol (VENTOLIN HFA) 108 (90 Base) MCG/ACT inhaler Inhale 2 puffs into the lungs in the morning and at bedtime.  ?  amiodarone (PACERONE) 100 MG tablet Take 2 tablets (200 mg total) by mouth daily.  ? apixaban (ELIQUIS) 5 MG TABS tablet Take 2.5 mg by mouth 2 (two) times daily.  ? digoxin (LANOXIN) 0.125 MG tablet Take 0.125 mg by mouth daily. Hold and notify MD if HR less than 60  ? Ensure (ENSURE) Take 237 mLs by mouth 3 (three) times daily between meals.  ? furosemide (LASIX) 20 MG tablet Take 20 mg by mouth daily.  ? omeprazole (PRILOSEC) 20 MG capsule Take 20 mg by mouth daily.  ? OXYGEN Inhale 2 L into the lungs continuous.  ? sertraline (ZOLOFT) 25 MG tablet Take 25 mg by mouth daily.  ? TRELEGY ELLIPTA 100-62.5-25 MCG/INH AEPB Take 1 puff by mouth daily.  ? zinc oxide 20 % ointment Apply 1 application. topically as needed for irritation.  ? ?No facility-administered encounter medications on file as of 07/15/2021.  ? ? ?Review of Systems  ?Unable to perform ROS: Dementia  ? ?Immunization History  ?Administered Date(s) Administered  ? Influenza-Unspecified 11/29/2020  ? Janssen (J&J) SARS-COV-2 Vaccination 06/16/2019  ? Pneumococcal Conjugate-13 01/16/2015  ? ?Pertinent  Health Maintenance Due  ?Topic Date Due  ? DEXA SCAN  Never done  ? INFLUENZA VACCINE  10/15/2021  ? ? ?  06/09/2021  ? 12:00 PM 06/10/2021  ?  2:00 AM 06/10/2021  ? 11:00 AM 06/10/2021  ?  9:02 PM 06/11/2021  ?  8:05 AM  ?Fall Risk  ?Patient Fall Risk Level High fall risk High fall risk High fall risk High fall risk High fall risk  ? ?Functional Status Survey: ?  ? ?Vitals:  ? 07/15/21 1005  ?BP: (!) 158/71  ?Pulse: 78  ?Resp: 19  ?Temp: (!) 97.5 ?F (36.4 ?C)  ?SpO2: 90%  ?Weight: 90 lb 9.6 oz (41.1 kg)  ?Height: 5\' 6"  (1.676 m)  ? ?Body mass index is 14.62 kg/m?Marland Kitchen ?Physical Exam ?Vitals reviewed.  ?Constitutional:   ?   General: She is not in acute distress. ?HENT:  ?   Head: Normocephalic and atraumatic.  ?Eyes:  ?   General:     ?   Right eye: No discharge.     ?   Left eye: No discharge.  ?Neck:  ?   Vascular: No carotid bruit.  ?Cardiovascular:  ?   Rate and  Rhythm: Normal rate. Rhythm irregular.  ?   Pulses: Normal pulses.  ?   Heart sounds: Normal heart sounds.  ?Pulmonary:  ?   Effort: Pulmonary effort is normal. No respiratory distress.  ?   Breath sounds: Normal breath sounds. No wheezing.  ?   Comments: 4 liters oxygen ?Abdominal:  ?   General: Bowel sounds are normal. There is no distension.  ?   Palpations: Abdomen is soft.  ?   Tenderness: There is no abdominal tenderness.  ?Musculoskeletal:  ?   Cervical back: Neck supple.  ?   Left knee: Swelling, deformity and effusion present. Decreased range of motion. Tenderness present over the MCL.  ?   Right lower leg: Edema  present.  ?   Left lower leg: Edema present.  ?   Comments: Non-pitting, left leg internally rotated  ?Lymphadenopathy:  ?   Cervical: No cervical adenopathy.  ?Skin: ?   General: Skin is warm and dry.  ?   Findings: Bruising present. No lesion.  ?Neurological:  ?   General: No focal deficit present.  ?   Mental Status: She is alert. Mental status is at baseline.  ?   Motor: Weakness present.  ?   Gait: Gait abnormal.  ?Psychiatric:     ?   Mood and Affect: Mood normal.     ?   Behavior: Behavior normal.     ?   Cognition and Memory: Memory is impaired.  ?   Comments: Very pleasant, followed commands, alert to self and familiar face  ? ? ?Labs reviewed: ?Recent Labs  ?  06/07/21 ?0250 06/08/21 ?0305 06/09/21 ?0253 06/10/21 ?0259 06/11/21 ?0422 07/08/21 ?0000  ?NA 134*  --  134* 136  --  137  ?K 3.8  --  3.1* 4.4  --  4.4  ?CL 92*  --  91* 97*  --  93*  ?CO2 37*  --  36* 34*  --  39*  ?GLUCOSE 85  --  74 76  --   --   ?BUN 20  --  16 15  --  16  ?CREATININE 0.56  --  0.59 0.64  --  0.6  ?CALCIUM 8.3*  --  8.3* 8.4*  --  8.8  ?MG 2.3   < > 1.9 2.1 1.9  --   ?PHOS 3.6   < > 3.1 3.3 2.8  --   ? < > = values in this interval not displayed.  ? ?Recent Labs  ?  06/05/21 ?0252 06/06/21 ?0333 06/07/21 ?0250 07/08/21 ?0000  ?AST 11* 14* 14* 12*  ?ALT 9 10 10 9   ?ALKPHOS 66 70 63 57  ?BILITOT 0.1* 0.4 0.1*   --   ?PROT 5.6* 5.8* 5.6*  --   ?ALBUMIN 3.0* 2.9* 2.8* 3.1*  ? ?Recent Labs  ?  06/05/21 ?0252 06/06/21 ?0333 06/07/21 ?0250 06/09/21 ?0253 07/08/21 ?0000  ?WBC 7.9 6.2 5.1 5.1 5.3  ?NEUTROABS 5.4 3.9 2.8

## 2021-07-19 ENCOUNTER — Other Ambulatory Visit: Payer: Self-pay | Admitting: Orthopedic Surgery

## 2021-07-19 DIAGNOSIS — S82102A Unspecified fracture of upper end of left tibia, initial encounter for closed fracture: Secondary | ICD-10-CM

## 2021-07-19 MED ORDER — HYDROCODONE-ACETAMINOPHEN 5-325 MG PO TABS: 1.0000 | ORAL_TABLET | Freq: Two times a day (BID) | ORAL | 0 refills | Status: AC | PRN

## 2021-07-25 LAB — COMPREHENSIVE METABOLIC PANEL
Calcium: 8.4 — AB (ref 8.7–10.7)
eGFR: 83

## 2021-07-25 LAB — CBC AND DIFFERENTIAL
HCT: 28 — AB (ref 36–46)
Hemoglobin: 9.1 — AB (ref 12.0–16.0)
Neutrophils Absolute: 3784
Platelets: 379 10*3/uL (ref 150–400)
WBC: 5.5

## 2021-07-25 LAB — BASIC METABOLIC PANEL
BUN: 16 (ref 4–21)
CO2: 35 — AB (ref 13–22)
Chloride: 94 — AB (ref 99–108)
Creatinine: 0.6 (ref 0.5–1.1)
Glucose: 76
Potassium: 4.6 mEq/L (ref 3.5–5.1)
Sodium: 138 (ref 137–147)

## 2021-07-25 LAB — CBC: RBC: 3.02 — AB (ref 3.87–5.11)

## 2021-07-31 ENCOUNTER — Encounter: Payer: Self-pay | Admitting: Orthopedic Surgery

## 2021-07-31 ENCOUNTER — Non-Acute Institutional Stay (SKILLED_NURSING_FACILITY): Payer: Medicare Other | Admitting: Orthopedic Surgery

## 2021-07-31 DIAGNOSIS — K219 Gastro-esophageal reflux disease without esophagitis: Secondary | ICD-10-CM

## 2021-07-31 DIAGNOSIS — R4189 Other symptoms and signs involving cognitive functions and awareness: Secondary | ICD-10-CM

## 2021-07-31 DIAGNOSIS — I471 Supraventricular tachycardia: Secondary | ICD-10-CM

## 2021-07-31 DIAGNOSIS — I5032 Chronic diastolic (congestive) heart failure: Secondary | ICD-10-CM

## 2021-07-31 DIAGNOSIS — Z86718 Personal history of other venous thrombosis and embolism: Secondary | ICD-10-CM

## 2021-07-31 DIAGNOSIS — J411 Mucopurulent chronic bronchitis: Secondary | ICD-10-CM

## 2021-07-31 DIAGNOSIS — D649 Anemia, unspecified: Secondary | ICD-10-CM

## 2021-07-31 DIAGNOSIS — S82102A Unspecified fracture of upper end of left tibia, initial encounter for closed fracture: Secondary | ICD-10-CM | POA: Diagnosis not present

## 2021-07-31 DIAGNOSIS — F339 Major depressive disorder, recurrent, unspecified: Secondary | ICD-10-CM

## 2021-07-31 DIAGNOSIS — J90 Pleural effusion, not elsewhere classified: Secondary | ICD-10-CM

## 2021-07-31 NOTE — Progress Notes (Signed)
?Location:  [facility] Room Number: N28/A ?Place of Service:  SNF (31) ?Provider: Yvonna Alanis, NP ? ? ?Patient Care Team: ?Virgie Dad, MD as PCP - General (Internal Medicine) ?Darcus Austin ? ?Extended Emergency Contact Information ?Primary Emergency Contact: McNeil,Sheilagh ?Mobile Phone: [phone] ?Relation: Daughter ? ?Code Status:  DNR ?Goals of care: Advanced Directive information ? ?  07/31/2021  ? 10:15 AM  ?Advanced Directives  ?Does Patient Have a Medical Advance Directive? Yes  ?Type of Paramedic of [place];Living will;Out of facility DNR (pink MOST or yellow form)  ?Does patient want to make changes to medical advance directive? No - Patient declined  ?Copy of [place] in Chart? Yes - validated most recent copy scanned in chart (See row information)  ? ? ? ?Chief Complaint  ?Patient presents with  ? Medical Management of Chronic Issues  ?  Routine visit.  ? Quality Metric Gaps  ?  Discuss the need for TDAP, Shingrix, Dexa scan, Pneumonia, and additional Covid booster, or post pone if patient refuses.   ? ? ?HPI:  ?Pt is a 87 y.o. female seen today for medical management of chronic diseases.  ? ?She currently resides on the skilled nursing unit at [facility]. PMH: SSS, SVT/PAF, CHF, pulmonary HTN, COPD, chronic respiratory failure, chronic bronchitis, neoplasm of bronchus s/p lobectomy, h/o DVT, left femur fracture 08/2020, and cognitive impairment.  ? ?Followed by palliative.  ? ?Closed left proximal tibia fracture- 04/30 fall with increased left knee pain after event, Xray left knee revealed acute fracture to proximal tibia, HPOA did not want ED or ortho evaluation, started on norco for breakthrough pain- taken once/ ? Confusion/ discontinued, bed bound, reports minimal pain today  ?SVT/PAF- HR controlled with  amiodarone and Digoxin, Digoxin level 1.7 06/02/2021, TSH 3.692 06/02/2021, remains on Eliquis for clot  prevention ?Chronic bronchitis/COPD- 3-4LNC, h/o smoking, remains on Trelegy ?CHF- LVEF 60-65% 06/03/2021, remains on furosemide, BUN/creat 16/0.6 07/25/2021 ?H/o DVT- Eliquis recently decreased to 2.5 mg for maintenance ?Pleural effusion- 06/01/2021 CT chest moderate pleural effusion to right ?Depression- no mood changes, adjusting well to SNF, remains on Zoloft ?GERD- hgb 9.1 07/25/2021> 10.3 07/08/2021, remains on omeprazole ?Cognitive impairment- MRI brain with moderate diffuse atrophy and chronic small vessel disease 11/2020, no behavioral outbursts ? ?Recent blood pressures: ? 05/16- 131/59 ? 05/09- 163/66 ? 05/04- 157/67 ? ?Recent weights: ? 05/01- not recorded- bed bound  ? 04/19- 90.6 lbs ?  ? ?Past Medical History:  ?Diagnosis Date  ? Anemia   ? CHF (congestive heart failure) ([place])   ? 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 COPD (chronic obstructive pulmonary disease) ([place])   ? Dysphagia   ? GERD without esophagitis   ? Hyperlipidemia   ? Hypokalemia   ? Hypomagnesemia   ? Malignant neoplasm of bronchus and lung ([place])   ? Osteoporosis   ? Pleural effusion   ? Pneumonia   ? Pulmonary hypertension ([place])   ? Respiratory failure ([place])   ? Retention of urine   ? Sick sinus syndrome ([place])   ? Unspecified protein-calorie malnutrition ([place])   ? Ventricular premature depolarization   ? ?History reviewed. No pertinent surgical history. ? ?Allergies  ?Allergen Reactions  ? Amoxicillin Nausea Only and Other (See Comments)  ?  Listed on MAR  ? Doxycycline Nausea Only and Other (See Comments)  ?  Listed on MAR  ? Levofloxacin Nausea Only and  Other (See Comments)  ?  Not documented on the [facility]  ? Morphine And Related Nausea Only and Other (See Comments)  ?  Listed on MAR  ? ? ?Outpatient Encounter Medications as of 07/31/2021  ?Medication Sig  ? albuterol (VENTOLIN HFA) 108 (90 Base) MCG/ACT inhaler Inhale 2 puffs into the lungs in the morning and at bedtime.  ? amiodarone (PACERONE) 100 MG tablet  Take 2 tablets (200 mg total) by mouth daily.  ? apixaban (ELIQUIS) 2.5 MG TABS tablet Take 2.5 mg by mouth 2 (two) times daily.  ? digoxin (LANOXIN) 0.125 MG tablet Take 0.125 mg by mouth daily. Hold and notify MD if HR less than 60  ? furosemide (LASIX) 20 MG tablet Take 20 mg by mouth every morning.  ? lactose free nutrition (BOOST) LIQD Take 237 mLs by mouth in the morning and at bedtime.  ? omeprazole (PRILOSEC) 20 MG capsule Take 20 mg by mouth daily.  ? OXYGEN Inhale 3.5 L into the lungs continuous.  ? sertraline (ZOLOFT) 25 MG tablet Take 25 mg by mouth daily.  ? TRELEGY ELLIPTA 100-62.5-25 MCG/INH AEPB Take 1 puff by mouth every morning.  ? zinc oxide 20 % ointment Apply 1 application. topically as needed for irritation.  ? [DISCONTINUED] acetaminophen (TYLENOL) 325 MG tablet Take 650 mg by mouth every 4 (four) hours as needed.  ? ?No facility-administered encounter medications on file as of 07/31/2021.  ? ? ?Review of Systems  ?Constitutional:  Negative for activity change, appetite change, chills, fatigue and fever.  ?HENT:  Negative for congestion and trouble swallowing.   ?Eyes:  Negative for visual disturbance.  ?Respiratory:  Positive for cough, shortness of breath and wheezing.   ?Cardiovascular:  Negative for chest pain and leg swelling.  ?Gastrointestinal:  Negative for abdominal distention, abdominal pain, blood in stool, constipation, diarrhea, nausea and vomiting.  ?Genitourinary:  Negative for dysuria, frequency and hematuria.  ?Musculoskeletal:  Positive for arthralgias and gait problem.  ?Neurological:  Positive for weakness. Negative for dizziness and headaches.  ?Psychiatric/Behavioral:  Positive for confusion and dysphoric mood. Negative for sleep disturbance. The patient is not nervous/anxious.   ? ?Immunization History  ?Administered Date(s) Administered  ? Influenza-Unspecified 11/29/2020  ? Janssen (J&J) SARS-COV-2 Vaccination 06/16/2019  ? Pneumococcal Conjugate-13 01/16/2015   ? ?Pertinent  Health Maintenance Due  ?Topic Date Due  ? DEXA SCAN  Never done  ? INFLUENZA VACCINE  10/15/2021  ? ? ?  06/09/2021  ? 12:00 PM 06/10/2021  ?  2:00 AM 06/10/2021  ? 11:00 AM 06/10/2021  ?  9:02 PM 06/11/2021  ?  8:05 AM  ?Fall Risk  ?Patient Fall Risk Level High fall risk High fall risk High fall risk High fall risk High fall risk  ? ?Functional Status Survey: ?  ? ?Vitals:  ? 07/31/21 1011  ?BP: (!) 131/59  ?Pulse: 90  ?Resp: 20  ?Temp: 98.2 ?F (36.8 ?C)  ?SpO2: 99%  ?Weight: 90 lb 9.6 oz (41.1 kg)  ?Height: 5\' 5"  (1.651 m)  ? ?Body mass index is 15.08 kg/m?Marland Kitchen ?Physical Exam ?Vitals reviewed.  ?Constitutional:   ?   General: She is not in acute distress. ?HENT:  ?   Head: Normocephalic.  ?   Right Ear: There is no impacted cerumen.  ?   Left Ear: There is no impacted cerumen.  ?   Nose: Nose normal.  ?   Mouth/Throat:  ?   Mouth: Mucous membranes are moist.  ?Eyes:  ?  General:     ?   Right eye: No discharge.     ?   Left eye: No discharge.  ?   Comments: glasses  ?Cardiovascular:  ?   Rate and Rhythm: Normal rate and regular rhythm.  ?   Pulses: Normal pulses.  ?   Heart sounds: Normal heart sounds.  ?Pulmonary:  ?   Effort: Pulmonary effort is normal. No respiratory distress.  ?   Breath sounds: Examination of the right-upper field reveals rhonchi. Examination of the left-upper field reveals rhonchi. Rhonchi present. No wheezing or rales.  ?Abdominal:  ?   General: Bowel sounds are normal. There is no distension.  ?   Palpations: Abdomen is soft.  ?   Tenderness: There is no abdominal tenderness.  ?Musculoskeletal:  ?   Cervical back: Neck supple.  ?   Left knee: Swelling and ecchymosis present. Decreased range of motion. Tenderness present.  ?   Right lower leg: No edema.  ?   Left lower leg: No edema.  ?   Comments: Yellowish bruising and swelling to left knee, unable to bend due to pain, left dorsiflexion 3/5, right dorsiflexion 3/5.   ?Skin: ?   General: Skin is warm and dry.  ?   Capillary  Refill: Capillary refill takes less than 2 seconds.  ?Neurological:  ?   General: No focal deficit present.  ?   Mental Status: She is alert. Mental status is at baseline.  ?   Motor: Weakness present.  ?   Gait: Gait abnor

## 2021-08-16 ENCOUNTER — Encounter: Payer: Self-pay | Admitting: Adult Health

## 2021-08-16 ENCOUNTER — Non-Acute Institutional Stay (SKILLED_NURSING_FACILITY): Payer: Medicare Other | Admitting: Adult Health

## 2021-08-16 DIAGNOSIS — J9601 Acute respiratory failure with hypoxia: Secondary | ICD-10-CM

## 2021-08-16 DIAGNOSIS — I9589 Other hypotension: Secondary | ICD-10-CM | POA: Diagnosis not present

## 2021-08-16 DIAGNOSIS — I5032 Chronic diastolic (congestive) heart failure: Secondary | ICD-10-CM | POA: Diagnosis not present

## 2021-08-16 DIAGNOSIS — I471 Supraventricular tachycardia: Secondary | ICD-10-CM

## 2021-08-16 LAB — BASIC METABOLIC PANEL
BUN: 18 (ref 4–21)
CO2: 41 — AB (ref 13–22)
Chloride: 95 — AB (ref 99–108)
Creatinine: 0.7 (ref 0.5–1.1)
Glucose: 90
Potassium: 4.6 mEq/L (ref 3.5–5.1)
Sodium: 138 (ref 137–147)

## 2021-08-16 LAB — CBC AND DIFFERENTIAL
HCT: 33 — AB (ref 36–46)
Hemoglobin: 11 — AB (ref 12.0–16.0)
Neutrophils Absolute: 7043
Platelets: 343 10*3/uL (ref 150–400)
WBC: 9.1

## 2021-08-16 LAB — CBC: RBC: 3.58 — AB (ref 3.87–5.11)

## 2021-08-16 LAB — COMPREHENSIVE METABOLIC PANEL
Calcium: 8.6 — AB (ref 8.7–10.7)
eGFR: 80

## 2021-08-16 NOTE — Progress Notes (Signed)
Location:  [facility] Room Number: N28/A Place of Service:  SNF (31) Provider: Durenda Age, NP   Patient Care Team: Virgie Dad, MD as PCP - General (Internal Medicine) Darcus Austin  Extended Emergency Contact Information Primary Emergency Contact: [place] Mobile Phone: [phone] Relation: Daughter  Code Status:  DNR Goals of care: Advanced Directive information    08/16/2021   11:04 AM  Advanced Directives  Does Patient Have a Medical Advance Directive? Yes  Type of Paramedic of [place];Living will;Out of facility DNR (pink MOST or yellow form)  Does patient want to make changes to medical advance directive? No - Patient declined  Copy of [place] in Chart? Yes - validated most recent copy scanned in chart (See row information)     Chief Complaint  Patient presents with   Acute Visit    Respiratory failure    HPI:  Pt is a 88 y.o. female seen today for an acute visit for SOB. Resident called out for help and was noted to have HR 126. She appeared to be anxious and pale. O2 sat was 88% while on 3L/min and BP 85/44. After several minutes, O2 sat up to 91-92% while on 3L/min. She takes Digoxin 0.125 mg daily, Amiodarone 200 mg daily and Eliquis 2.5 mg BID for SVT/atrial flutter. She denies chest pains. Daughter does not want resident to be sent to the hospital. She takes Trelegy- Ellipta 1 puff inhalation daily and PRN albuterol for COPD. She was recently hospitalized 06/01/21 to 06/11/21 for SVT, bilateral pleural effusion and heart failure exacerbation. She was recommended to have palliative consult.  She has a PMH of malignant neoplasm of the bronchus and lung.   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 No pertinent surgical history.  Allergies  Allergen Reactions   Amoxicillin Nausea Only and Other (See Comments)    Listed on MAR   Doxycycline Nausea Only and Other (See Comments)    Listed on MAR   Levofloxacin Nausea Only and Other (See Comments)    Not documented on the [facility]   Morphine And Related Nausea Only and Other (See Comments)    Listed on [facility]    Outpatient Encounter Medications as of 08/16/2021  Medication Sig   albuterol (VENTOLIN HFA) 108 (90 Base) MCG/ACT inhaler Inhale 2 puffs into the lungs in the morning.   amiodarone (PACERONE) 100 MG tablet Take 2 tablets (200 mg total) by mouth daily.   apixaban (ELIQUIS) 2.5 MG TABS tablet Take 2.5 mg by mouth 2 (two) times daily.   digoxin (LANOXIN) 0.125 MG tablet Take 0.125 mg by mouth daily. Hold and notify MD if HR less than 60   furosemide (LASIX) 20 MG tablet Take 20 mg by mouth every morning.  lactose free nutrition (BOOST) LIQD Take 237 mLs by mouth in the morning and at bedtime.   omeprazole (PRILOSEC) 20 MG capsule Take 20 mg by mouth daily.   OXYGEN Inhale 3.5 L into the lungs continuous.   sertraline (ZOLOFT) 25 MG tablet Take 25 mg by mouth daily.   TRELEGY ELLIPTA 100-62.5-25 MCG/INH AEPB Take 1 puff by mouth every morning.   zinc oxide 20 % ointment Apply 1 application. topically as needed for irritation (To buttocks after every incontinent episode).   No facility-administered encounter medications on file as of 08/16/2021.    Review of Systems  Constitutional:  Negative for appetite change, chills, fatigue and  fever.  HENT:  Negative for congestion, hearing loss, rhinorrhea and sore throat.   Eyes: Negative.   Respiratory:  Positive for shortness of breath. Negative for cough and wheezing.   Cardiovascular:  Positive for palpitations. Negative for chest pain and leg swelling.  Gastrointestinal:  Negative for abdominal pain, constipation, diarrhea, nausea and vomiting.  Genitourinary:  Negative for dysuria.  Musculoskeletal:  Negative for arthralgias, back pain and myalgias.  Skin:  Negative for color change, rash and wound.  Neurological:  Negative for dizziness, weakness and headaches.  Psychiatric/Behavioral:  Negative for behavioral problems. The patient is not nervous/anxious.     Immunization History  Administered Date(s) Administered   Influenza-Unspecified 11/29/2020   Janssen (J&J) SARS-COV-2 Vaccination 06/16/2019   Pneumococcal Conjugate-13 01/16/2015   Pertinent  Health Maintenance Due  Topic Date Due   DEXA SCAN  Never done   INFLUENZA VACCINE  10/15/2021      06/09/2021   12:00 PM 06/10/2021    2:00 AM 06/10/2021   11:00 AM 06/10/2021    9:02 PM 06/11/2021    8:05 AM  Fall Risk  Patient Fall Risk Level High fall risk High fall risk High fall risk High fall risk High fall risk   Functional Status Survey:    Vitals:   08/16/21 1100  BP: (!) 141/71  Pulse: 74  Resp: 20  Temp: (!) 97.4 F (36.3 C)  SpO2: 97%  Weight: 89 lb (40.4 kg)  Height: 5\' 5"  (1.651 m)   Body mass index is 14.81 kg/m. Physical Exam Constitutional:      General: She is not in acute distress. HENT:     Head: Normocephalic and atraumatic.     Nose: Nose normal.     Mouth/Throat:     Mouth: Mucous membranes are moist.  Eyes:     Conjunctiva/sclera: Conjunctivae normal.  Cardiovascular:     Rate and Rhythm: Normal rate and regular rhythm.  Pulmonary:     Effort: Pulmonary effort is normal.     Breath sounds: Normal breath sounds.  Abdominal:     General: Bowel sounds are normal.      Palpations: Abdomen is soft.  Musculoskeletal:     Cervical back: Normal range of motion.  Skin:    General: Skin is warm and dry.  Neurological:     Mental Status: She is alert. Mental status is at baseline.  Psychiatric:        Mood and Affect: Mood normal.        Behavior: Behavior normal.     Labs reviewed: Recent Labs    06/07/21 0250 06/08/21 0305 06/09/21 0253 06/10/21 0259 06/11/21 0422 07/08/21 0000 07/25/21 0000  NA 134*  --  134* 136  --  137 138  K 3.8  --  3.1* 4.4  --  4.4 4.6  CL 92*  --  91* 97*  --  93* 94*  CO2 37*  --  36* 34*  --  39* 35*  GLUCOSE 85  --  74 76  --   --   --   BUN 20  --  16 15  --  16 16  CREATININE 0.56  --  0.59 0.64  --  0.6 0.6  CALCIUM 8.3*  --  8.3* 8.4*  --  8.8 8.4*  MG 2.3   < > 1.9 2.1 1.9  --   --   PHOS 3.6   < > 3.1 3.3 2.8  --   --    < > = values in this interval not displayed.   Recent Labs    06/05/21 0252 06/06/21 0333 06/07/21 0250 07/08/21 0000  AST 11* 14* 14* 12*  ALT 9 10 10 9   ALKPHOS 66 70 63 57  BILITOT 0.1* 0.4 0.1*  --   PROT 5.6* 5.8* 5.6*  --   ALBUMIN 3.0* 2.9* 2.8* 3.1*   Recent Labs    06/06/21 0333 06/07/21 0250 06/09/21 0253 07/08/21 0000 07/25/21 0000  WBC 6.2 5.1 5.1 5.3 5.5  NEUTROABS 3.9 2.8  --   --  3,784.00  HGB 9.4* 9.3* 8.7* 10.3* 9.1*  HCT 30.5* 29.7* 27.4* 32* 28*  MCV 93.8 93.1 92.6  --   --   PLT 406* 397 347 223 379   Lab Results  Component Value Date   TSH 3.692 06/02/2021   No results found for: HGBA1C No results found for: CHOL, HDL, LDLCALC, LDLDIRECT, TRIG, CHOLHDL  Significant Diagnostic Results in last 30 days:  No results found.  Assessment/Plan  1. Paroxysmal SVT (supraventricular tachycardia) (HCC) -Continue amiodarone and digoxin for rate control and Eliquis for anticoagulation -  Daughter talked to charge nurse via telephone and requested for resident not to be transferred to the hospital and to have hospice consult  2. Acute respiratory  failure with hypoxia (HCC) -   Continue O2 at 3 L/min  via  continuously  3. Chronic diastolic congestive heart failure (HCC)    Continue Lasix 20 mg daily and digoxin  4. Other specified hypotension - BP  85/44, will start on midodrine 5 mg 1 tab by mouth twice a day PRN for SBP < 100    Family/ staff Communication: Discussed plan of care with resident and charge nurse. Tried to call daughter and left voicemail.  Labs/tests ordered:   BNP, BMP with GFR, CBC with differentials and chest x-ray stat

## 2021-08-19 ENCOUNTER — Non-Acute Institutional Stay (SKILLED_NURSING_FACILITY): Payer: Medicare Other | Admitting: Orthopedic Surgery

## 2021-08-19 ENCOUNTER — Encounter: Payer: Self-pay | Admitting: Orthopedic Surgery

## 2021-08-19 DIAGNOSIS — S82102A Unspecified fracture of upper end of left tibia, initial encounter for closed fracture: Secondary | ICD-10-CM

## 2021-08-19 DIAGNOSIS — I471 Supraventricular tachycardia: Secondary | ICD-10-CM | POA: Diagnosis not present

## 2021-08-19 DIAGNOSIS — J90 Pleural effusion, not elsewhere classified: Secondary | ICD-10-CM | POA: Diagnosis not present

## 2021-08-19 DIAGNOSIS — J411 Mucopurulent chronic bronchitis: Secondary | ICD-10-CM

## 2021-08-19 DIAGNOSIS — F339 Major depressive disorder, recurrent, unspecified: Secondary | ICD-10-CM

## 2021-08-19 DIAGNOSIS — R4189 Other symptoms and signs involving cognitive functions and awareness: Secondary | ICD-10-CM

## 2021-08-19 DIAGNOSIS — Z86718 Personal history of other venous thrombosis and embolism: Secondary | ICD-10-CM

## 2021-08-19 DIAGNOSIS — K219 Gastro-esophageal reflux disease without esophagitis: Secondary | ICD-10-CM

## 2021-08-19 DIAGNOSIS — I5032 Chronic diastolic (congestive) heart failure: Secondary | ICD-10-CM

## 2021-08-19 NOTE — Progress Notes (Signed)
Location:  [place] Room Number: N28/A Place of Service:  SNF [phone]) Provider:  Yvonna Alanis, NP   Patient Care Team: Virgie Dad, MD as PCP - General (Internal Medicine) Darcus Austin  Extended Emergency Contact Information Primary Emergency Contact: [place] Mobile Phone: [phone] Relation: Daughter  Code Status:  DNR Goals of care: Advanced Directive information    08/19/2021    9:37 AM  Advanced Directives  Does Patient Have a Medical Advance Directive? Yes  Type of Paramedic of [place];Living will;Out of facility DNR (pink MOST or yellow form)  Does patient want to make changes to medical advance directive? No - Patient declined  Copy of [place] in Chart? Yes - validated most recent copy scanned in chart (See row information)     Chief Complaint  Patient presents with   Acute Visit    SOB    HPI:  Pt is a 88 y.o. female seen today for an acute visit for shortness of breath.   06/02 she had increased sob. 06/03 CXR revealed small left pleural effusion. WBC 9.1, BNP 90, chloride 95, CO2 41 08/16/2021. Enrolled in hospice over the weekend. This morning she denies sob. O2 sats > 95% on 3 liters oxygen. H/o pleural effusion in past with thoracentesis. She is also in furosemide 20 mg daily for CHF. Discussed treatment options with patient/daughter/hospice. Will refrain from increasing diuretics at this time. MOST form completed by hospice.   Closed left proximal tibia fracture- 04/30 mechanical fall, xray revealed acute fracture to proximal tibia, HPOA did not want ED or ortho evaluation, unsuccessful trial of norco- confusion, bed bound, denies knee pain today SVT/PAF- HR controlled with  amiodarone and Digoxin, Digoxin level 1.7 06/02/2021, TSH 3.692 06/02/2021, remains on Eliquis for clot prevention Chronic bronchitis/COPD- 3-4LNC, h/o smoking, remains on Trelegy CHF- LVEF 60-65% 06/03/2021,  remains on furosemide, BUN/creat 18/0.67 08/16/2021 H/o DVT- Eliquis recently decreased to 2.5 mg for maintenance Pleural effusion- 06/01/2021 CT chest moderate pleural effusion to right Depression- no mood changes, remains on Zoloft GERD- hgb 11.0 08/16/2021 > 10.3 07/08/2021, remains on omeprazole Cognitive impairment- MRI brain with moderate diffuse atrophy and chronic small vessel disease 11/2020, no behavioral outbursts     Past Medical History:  Diagnosis Date   Anemia    CHF (congestive heart failure) (HCC)    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  Allergies  Allergen Reactions   Amoxicillin Nausea Only and Other (See Comments)    Listed on MAR   Doxycycline Nausea Only and Other (See Comments)    Listed on MAR   Levofloxacin Nausea Only and Other (See Comments)    Not documented on the [facility]   Morphine And Related Nausea Only and Other (See Comments)    Listed on [facility]    Outpatient Encounter Medications as of 08/19/2021  Medication Sig  albuterol (VENTOLIN HFA) 108 (90 Base) MCG/ACT inhaler Inhale 2 puffs into the lungs in the morning.   amiodarone (PACERONE) 100 MG tablet Take 2 tablets (200 mg total) by mouth daily.   apixaban (ELIQUIS) 2.5 MG TABS tablet Take 2.5 mg by mouth 2 (two) times daily.   digoxin (LANOXIN) 0.125 MG tablet Take 0.125 mg by mouth daily. Hold and notify MD if HR less than 60   furosemide (LASIX) 20 MG tablet Take 20 mg by mouth every  morning.   lactose free nutrition (BOOST) LIQD Take 237 mLs by mouth in the morning and at bedtime.   midodrine (PROAMATINE) 5 MG tablet Take 5 mg by mouth 2 (two) times daily as needed.   omeprazole (PRILOSEC) 20 MG capsule Take 20 mg by mouth daily.   OXYGEN Inhale 3.5 L into the lungs continuous.   sertraline (ZOLOFT) 25 MG tablet Take 25 mg by mouth daily.   TRELEGY ELLIPTA 100-62.5-25 MCG/INH AEPB Take 1 puff by mouth every morning.   zinc oxide 20 % ointment Apply 1 application. topically as needed for irritation (To buttocks after every incontinent episode).   No facility-administered encounter medications on file as of 08/19/2021.    Review of Systems  Constitutional:  Negative for activity change, appetite change, chills, fatigue and fever.  HENT:  Negative for congestion and trouble swallowing.   Eyes:  Negative for visual disturbance.  Respiratory:  Positive for shortness of breath. Negative for cough and wheezing.   Cardiovascular:  Negative for chest pain and leg swelling.  Gastrointestinal:  Negative for abdominal distention, abdominal pain, constipation, diarrhea, nausea and vomiting.  Genitourinary:  Negative for dysuria, frequency and hematuria.  Musculoskeletal:  Positive for arthralgias and gait problem.  Skin:  Negative for wound.  Neurological:  Positive for weakness. Negative for dizziness and headaches.  Psychiatric/Behavioral:  Positive for confusion and dysphoric mood. The patient is not nervous/anxious.    Immunization History  Administered Date(s) Administered   Influenza-Unspecified 11/29/2020   Janssen (J&J) SARS-COV-2 Vaccination 06/16/2019   Pneumococcal Conjugate-13 01/16/2015   Pertinent  Health Maintenance Due  Topic Date Due   DEXA SCAN  Never done   INFLUENZA VACCINE  10/15/2021      06/09/2021   12:00 PM 06/10/2021    2:00 AM 06/10/2021   11:00 AM 06/10/2021    9:02 PM 06/11/2021    8:05 AM  Fall Risk  Patient Fall Risk Level High fall risk  High fall risk High fall risk High fall risk High fall risk   Functional Status Survey:    Vitals:   08/19/21 0935  BP: (!) 141/71  Pulse: 87  Resp: 20  Temp: (!) 97.5 F (36.4 C)  SpO2: 98%  Weight: 89 lb (40.4 kg)  Height: 5\' 5"  (1.651 m)   Body mass index is 14.81 kg/m. Physical Exam Vitals reviewed.  Constitutional:      General: She is not in acute distress. HENT:     Head: Normocephalic.  Eyes:     General:        Right eye: No discharge.        Left eye: No discharge.  Neck:     Vascular: No carotid bruit.  Cardiovascular:     Rate and Rhythm: Normal rate. Rhythm irregular.     Pulses: Normal pulses.     Heart sounds: Normal heart sounds.  Pulmonary:     Effort: Pulmonary effort is normal. No respiratory distress.     Breath sounds:  Examination of the right-lower field reveals decreased breath sounds. Examination of the left-lower field reveals decreased breath sounds. Decreased breath sounds present. No wheezing or rales.  Abdominal:     General: Bowel sounds are normal. There is no distension.     Palpations: Abdomen is soft.     Tenderness: There is no abdominal tenderness.  Musculoskeletal:     Cervical back: Neck supple.     Left knee: Swelling present. No erythema or ecchymosis. Normal range of motion. No tenderness.     Right lower leg: No edema.     Left lower leg: No edema.  Lymphadenopathy:     Cervical: No cervical adenopathy.  Skin:    General: Skin is warm and dry.     Capillary Refill: Capillary refill takes less than 2 seconds.  Neurological:     General: No focal deficit present.     Mental Status: She is alert. Mental status is at baseline.     Motor: Weakness present.     Gait: Gait abnormal.     Comments: Bed bound  Psychiatric:        Mood and Affect: Mood normal.        Behavior: Behavior normal.     Comments: Very pleasant, follows commands, alert to self/person/situation    Labs reviewed: Recent Labs    06/07/21 0250  06/08/21 0305 06/09/21 0253 06/10/21 0259 06/11/21 0422 07/08/21 0000 07/25/21 0000 08/16/21 0000  NA 134*  --  134* 136  --  137 138 138  K 3.8  --  3.1* 4.4  --  4.4 4.6 4.6  CL 92*  --  91* 97*  --  93* 94* 95*  CO2 37*  --  36* 34*  --  39* 35* 41*  GLUCOSE 85  --  74 76  --   --   --   --   BUN 20  --  16 15  --  16 16 18   CREATININE 0.56  --  0.59 0.64  --  0.6 0.6 0.7  CALCIUM 8.3*  --  8.3* 8.4*  --  8.8 8.4* 8.6*  MG 2.3   < > 1.9 2.1 1.9  --   --   --   PHOS 3.6   < > 3.1 3.3 2.8  --   --   --    < > = values in this interval not displayed.   Recent Labs    06/05/21 0252 06/06/21 0333 06/07/21 0250 07/08/21 0000  AST 11* 14* 14* 12*  ALT 9 10 10 9   ALKPHOS 66 70 63 57  BILITOT 0.1* 0.4 0.1*  --   PROT 5.6* 5.8* 5.6*  --   ALBUMIN 3.0* 2.9* 2.8* 3.1*   Recent Labs    06/06/21 0333 06/07/21 0250 06/09/21 0253 07/08/21 0000 07/25/21 0000 08/16/21 0000  WBC 6.2 5.1 5.1 5.3 5.5 9.1  NEUTROABS 3.9 2.8  --   --  3,784.00 7,043.00  HGB 9.4* 9.3* 8.7* 10.3* 9.1* 11.0*  HCT 30.5* 29.7* 27.4* 32* 28* 33*  MCV 93.8 93.1 92.6  --   --   --   PLT 406* 397 347 223 379 343   Lab Results  Component Value Date   TSH 3.692 06/02/2021   No results found for: HGBA1C No results found for: CHOL, HDL, LDLCALC, LDLDIRECT, TRIG, CHOLHDL  Significant Diagnostic Results in last 30 days:  No results found.  Assessment/Plan 1. Recurrent pleural effusion - 06/03 CXR indicated small  left pleural effusion -enrolled in hospice 06/04 - cont oxygen use  2. Closed fracture of proximal end of left tibia, unspecified fracture morphology, initial encounter -  04/30 mechanical fall - HPOA refused ED or ortho visit - bed bound - minimal pain - unsuccessful trial of norco- ? confusion - cont tylenol prn   3. Paroxysmal SVT (supraventricular tachycardia) (HCC) - HR controlled with amiodarone and digoxin - cont Eliquis for clot prevention  4. Mucopurulent chronic bronchitis  (HCC) - cont oxygen - cont Trelegy and abuterol  5. Chronic diastolic congestive heart failure (HCC) - compensated - cont furosemide  6. History of DVT (deep vein thrombosis) - cont Eliquis  7. Recurrent depression (HCC) - no mod changes - cont Zoloft  8. Gastroesophageal reflux disease without esophagitis - hgb stable, now 11.0 08/16/2021 - cont omeprazole  9. Cognitive impairment - no behavioral outbursts - cont skilled nursing care    Family/ staff Communication: plan discussed with patient/daughter/hospice  Labs/tests ordered:  none

## 2021-08-28 ENCOUNTER — Non-Acute Institutional Stay (SKILLED_NURSING_FACILITY): Payer: Medicare Other | Admitting: Orthopedic Surgery

## 2021-08-28 ENCOUNTER — Encounter: Payer: Self-pay | Admitting: Orthopedic Surgery

## 2021-08-28 DIAGNOSIS — E43 Unspecified severe protein-calorie malnutrition: Secondary | ICD-10-CM

## 2021-08-28 DIAGNOSIS — J411 Mucopurulent chronic bronchitis: Secondary | ICD-10-CM

## 2021-08-28 DIAGNOSIS — Z86718 Personal history of other venous thrombosis and embolism: Secondary | ICD-10-CM

## 2021-08-28 DIAGNOSIS — I471 Supraventricular tachycardia: Secondary | ICD-10-CM | POA: Diagnosis not present

## 2021-08-28 DIAGNOSIS — S82102A Unspecified fracture of upper end of left tibia, initial encounter for closed fracture: Secondary | ICD-10-CM | POA: Diagnosis not present

## 2021-08-28 DIAGNOSIS — J90 Pleural effusion, not elsewhere classified: Secondary | ICD-10-CM | POA: Diagnosis not present

## 2021-08-28 DIAGNOSIS — R4189 Other symptoms and signs involving cognitive functions and awareness: Secondary | ICD-10-CM

## 2021-08-28 DIAGNOSIS — F339 Major depressive disorder, recurrent, unspecified: Secondary | ICD-10-CM

## 2021-08-28 DIAGNOSIS — K219 Gastro-esophageal reflux disease without esophagitis: Secondary | ICD-10-CM

## 2021-08-28 DIAGNOSIS — I5032 Chronic diastolic (congestive) heart failure: Secondary | ICD-10-CM

## 2021-08-28 NOTE — Progress Notes (Signed)
Location:  [place] Room Number: [place] of Service:  SNF [id number]) Provider:  Yvonna Alanis, NP   Virgie Dad, MD  Patient Care Team: Virgie Dad, MD as PCP - General (Internal Medicine) Darcus Austin  Extended Emergency Contact Information Primary Emergency Contact: [place] Mobile Phone: [phone] Relation: Daughter  Code Status:  DNR Goals of care: Advanced Directive information    08/19/2021    9:37 AM  Advanced Directives  Does Patient Have a Medical Advance Directive? Yes  Type of Paramedic of [place];Living will;Out of facility DNR (pink MOST or yellow form)  Does patient want to make changes to medical advance directive? No - Patient declined  Copy of [place] in Chart? Yes - validated most recent copy scanned in chart (See row information)     Chief Complaint  Patient presents with   Medical Management of Chronic Issues    HPI:  Pt is a 88 y.o. female seen today for medical management of chronic diseases.    Followed by hospice.   Recurrent pleural effusion- 06/03 CXR revealed small left pleural effusion, BNP 90 08/16/2021, h/o pleural effusion in past with thoracentesis, remains on furosemide 20 mg daily, denies increased sob today Closed left proximal tibia fracture- 04/30 mechanical fall, xray revealed acute fracture to proximal tibia, HPOA did not want ED or ortho evaluation, unsuccessful trial of norco- confusion, left knee remains swollen with slight bruising, mild pain intermittent, remains bed bound SVT/PAF- HR controlled with  amiodarone and Digoxin, Digoxin level 1.7 06/02/2021, TSH 3.692 06/02/2021, remains on Eliquis for clot prevention Chronic bronchitis/COPD- 3-4LNC, h/o smoking, remains on Trelegy CHF- LVEF 60-65% 06/03/2021, remains on furosemide, BUN/creat 18/0.67 08/16/2021 H/o DVT- remains on low dose Eliquis Pleural effusion- 06/01/2021 CT chest moderate  pleural effusion to right Depression- no mood changes, remains on Zoloft GERD- hgb 11.0 08/16/2021 > 10.3 07/08/2021, remains on omeprazole Cognitive impairment- MRI brain with moderate diffuse atrophy and chronic small vessel disease 11/2020, no behavioral outbursts, hospice added Seroquel prn   Midodrine added prn for SBP< 100 per hospice. See pressures below.   No recent falls or injuries.   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  Allergies  Allergen Reactions   Amoxicillin Nausea Only and Other (See Comments)    Listed on MAR   Doxycycline Nausea Only and Other (See Comments)    Listed on MAR   Levofloxacin Nausea Only and Other (See Comments)    Not documented on the [facility]   Morphine And Related Nausea Only and Other (See Comments)    Listed  on [facility]    Outpatient Encounter Medications as of 08/28/2021  Medication Sig   albuterol (VENTOLIN HFA) 108 (90 Base) MCG/ACT inhaler Inhale 2 puffs into the lungs in the morning.   amiodarone (PACERONE) 100 MG tablet Take 2 tablets (200 mg total) by mouth daily.   apixaban (ELIQUIS) 2.5 MG TABS tablet Take 2.5 mg by mouth 2 (two) times daily.   digoxin (LANOXIN) 0.125 MG tablet Take 0.125 mg by mouth daily. Hold  and notify MD if HR less than 60   furosemide (LASIX) 20 MG tablet Take 20 mg by mouth every morning.   lactose free nutrition (BOOST) LIQD Take 237 mLs by mouth in the morning and at bedtime.   midodrine (PROAMATINE) 5 MG tablet Take 5 mg by mouth 2 (two) times daily as needed.   omeprazole (PRILOSEC) 20 MG capsule Take 20 mg by mouth daily.   OXYGEN Inhale 3.5 L into the lungs continuous.   sertraline (ZOLOFT) 25 MG tablet Take 25 mg by mouth daily.   TRELEGY ELLIPTA 100-62.5-25 MCG/INH AEPB Take 1 puff by mouth every morning.   zinc oxide 20 % ointment Apply 1 application. topically as needed for irritation (To buttocks after every incontinent episode).   No facility-administered encounter medications on file as of 08/28/2021.    Review of Systems  Constitutional:  Negative for activity change, appetite change, chills, fatigue and fever.  HENT:  Negative for congestion and trouble swallowing.   Eyes:  Negative for visual disturbance.  Respiratory:  Positive for shortness of breath. Negative for cough and wheezing.   Cardiovascular:  Negative for chest pain and leg swelling.  Gastrointestinal:  Negative for abdominal distention, abdominal pain, constipation, diarrhea, nausea and vomiting.  Genitourinary:  Negative for dysuria, frequency and hematuria.  Musculoskeletal:  Positive for arthralgias, back pain, gait problem and joint swelling.  Skin:  Negative for wound.  Neurological:  Positive for weakness. Negative for dizziness and headaches.  Psychiatric/Behavioral:  Positive for confusion and dysphoric mood. Negative for sleep disturbance. The patient is not nervous/anxious.     Immunization History  Administered Date(s) Administered   Influenza-Unspecified 11/29/2020   Janssen (J&J) SARS-COV-2 Vaccination 06/16/2019   Pneumococcal Conjugate-13 01/16/2015   Pertinent  Health Maintenance Due  Topic Date Due   DEXA SCAN  Never done   INFLUENZA VACCINE  10/15/2021      06/09/2021    12:00 PM 06/10/2021    2:00 AM 06/10/2021   11:00 AM 06/10/2021    9:02 PM 06/11/2021    8:05 AM  Fall Risk  Patient Fall Risk Level High fall risk High fall risk High fall risk High fall risk High fall risk   Functional Status Survey:    Vitals:   08/28/21 1053  BP: 126/63  Pulse: 64  Resp: 18  Temp: (!) 97 F (36.1 C)  SpO2: 96%  Weight: 89 lb (40.4 kg)  Height: 5\' 5"  (1.651 m)   Body mass index is 14.81 kg/m. Physical Exam Vitals reviewed.  Constitutional:      General: She is not in acute distress.    Comments: Cachexia  HENT:     Head: Normocephalic.     Right Ear: There is no impacted cerumen.     Left Ear: There is no impacted cerumen.     Nose: Nose normal.     Mouth/Throat:     Mouth: Mucous membranes are moist.  Eyes:     General:        Right  eye: No discharge.        Left eye: No discharge.  Cardiovascular:     Rate and Rhythm: Normal rate and regular rhythm.     Pulses: Normal pulses.     Heart sounds: Normal heart sounds.  Pulmonary:     Effort: Pulmonary effort is normal. No respiratory distress.     Breath sounds: Normal breath sounds. No wheezing.  Abdominal:     General: Bowel sounds are normal. There is no distension.     Palpations: Abdomen is soft.     Tenderness: There is no abdominal tenderness.  Musculoskeletal:     Cervical back: Neck supple.     Right lower leg: No edema.     Left lower leg: No edema.     Comments: Left knee swelling near MCL, limited ROM, skin with yellowish/green bruising, no pain during exam  Skin:    General: Skin is warm.     Capillary Refill: Capillary refill takes less than 2 seconds.  Neurological:     General: No focal deficit present.     Mental Status: She is alert and oriented to person, place, and time.  Psychiatric:        Mood and Affect: Mood normal.        Behavior: Behavior normal.     Labs reviewed: Recent Labs    06/07/21 0250 06/08/21 0305 06/09/21 0253 06/10/21 0259 06/11/21 0422  07/08/21 0000 07/25/21 0000 08/16/21 0000  NA 134*  --  134* 136  --  137 138 138  K 3.8  --  3.1* 4.4  --  4.4 4.6 4.6  CL 92*  --  91* 97*  --  93* 94* 95*  CO2 37*  --  36* 34*  --  39* 35* 41*  GLUCOSE 85  --  74 76  --   --   --   --   BUN 20  --  16 15  --  16 16 18   CREATININE 0.56  --  0.59 0.64  --  0.6 0.6 0.7  CALCIUM 8.3*  --  8.3* 8.4*  --  8.8 8.4* 8.6*  MG 2.3   < > 1.9 2.1 1.9  --   --   --   PHOS 3.6   < > 3.1 3.3 2.8  --   --   --    < > = values in this interval not displayed.   Recent Labs    06/05/21 0252 06/06/21 0333 06/07/21 0250 07/08/21 0000  AST 11* 14* 14* 12*  ALT 9 10 10 9   ALKPHOS 66 70 63 57  BILITOT 0.1* 0.4 0.1*  --   PROT 5.6* 5.8* 5.6*  --   ALBUMIN 3.0* 2.9* 2.8* 3.1*   Recent Labs    06/06/21 0333 06/07/21 0250 06/09/21 0253 07/08/21 0000 07/25/21 0000 08/16/21 0000  WBC 6.2 5.1 5.1 5.3 5.5 9.1  NEUTROABS 3.9 2.8  --   --  3,784.00 7,043.00  HGB 9.4* 9.3* 8.7* 10.3* 9.1* 11.0*  HCT 30.5* 29.7* 27.4* 32* 28* 33*  MCV 93.8 93.1 92.6  --   --   --   PLT 406* 397 347 223 379 343   Lab Results  Component Value Date   TSH 3.692 06/02/2021   No results found for: "HGBA1C" No results found for: "CHOL", "HDL", "LDLCALC", "LDLDIRECT", "TRIG", "CHOLHDL"  Significant Diagnostic Results in last 30 days:  No results found.  Assessment/Plan: 1. Protein-calorie malnutrition, severe ([place]) -  BMI 14.81 - followed by hospice - cont monthly weights - cont Boost  2. Recurrent pleural effusion -  06/03 CXR indicated small left pleural effusion - cont furosemide - cont oxygen  3. Closed fracture of proximal end of left tibia, unspecified fracture morphology, initial encounter -  04/30 mechanical fall - HPOA refused ED or ortho visit - now in hospice, do not see need for f/u xray - mild intermittent pain, knee swelling near MCL, slight bruising - unsuccessful trial of norco d/t confusion - cont tylenol   4. Paroxysmal SVT  (supraventricular tachycardia) (HCC) - HR controlled with Digoxin and amiodarone - cont Eliquis  5. Mucopurulent chronic bronchitis ([place]) - h/o smoking - cont oxygen - cont Trelegy and albuterol  6. Chronic diastolic congestive heart failure (HCC) - compensated - cont furosemide  7. History of DVT (deep vein thrombosis) - cont Eliquis  8. Recurrent depression ([place]) - no mood changes - cont Zoloft  9. Gastroesophageal reflux disease without esophagitis - hgb stable - cont omeprazole  10. Cognitive impairment - Seroquel 25 mg Q8hrs prn per hospice - no behaviors  - cont skilled nursing     Family/ staff Communication: plan discussed with patient and nurse  Labs/tests ordered:  none

## 2021-09-05 ENCOUNTER — Encounter: Payer: Self-pay | Admitting: Adult Health

## 2021-09-05 ENCOUNTER — Non-Acute Institutional Stay (SKILLED_NURSING_FACILITY): Payer: Medicare Other | Admitting: Adult Health

## 2021-09-05 DIAGNOSIS — J9611 Chronic respiratory failure with hypoxia: Secondary | ICD-10-CM | POA: Diagnosis not present

## 2021-09-05 DIAGNOSIS — F333 Major depressive disorder, recurrent, severe with psychotic symptoms: Secondary | ICD-10-CM | POA: Diagnosis not present

## 2021-09-05 DIAGNOSIS — I471 Supraventricular tachycardia: Secondary | ICD-10-CM | POA: Diagnosis not present

## 2021-09-05 DIAGNOSIS — I5032 Chronic diastolic (congestive) heart failure: Secondary | ICD-10-CM | POA: Diagnosis not present

## 2021-09-05 NOTE — Progress Notes (Signed)
Location:  [facility] Room Number: NO/28/A Place of Service:  SNF (31) Provider:  Durenda Age, DNP, FNP-BC  Patient Care Team: Virgie Dad, MD as PCP - General (Internal Medicine) Darcus Austin  Extended Emergency Contact Information Primary Emergency Contact: [place] Mobile Phone: [phone] Relation: Daughter  Code Status:  DNR  Goals of care: Advanced Directive information    09/05/2021   10:29 AM  Advanced Directives  Does Patient Have a Medical Advance Directive? Yes  Type of Paramedic of [place];Living will;Out of facility DNR (pink MOST or yellow form)  Does patient want to make changes to medical advance directive? No - Patient declined  Copy of [place] in Chart? Yes - validated most recent copy scanned in chart (See row information)     Chief Complaint  Patient presents with   Acute Visit    Patient is being seen for agitation    HPI:  Pt is a 88 y.o. female seen today for acute visit for agitation. She is a long-term care resident of [facility]. She was reported to have paranoia and agitation.  She was reported to refuse medications and until the nurse " swore on the [place]".  She refused water from the cart and stated that she saw the nurse " throw something on it". She is followed by hospice.  She takes amiodarone 200 mg daily, digoxin 0.125 mg daily and Eliquis 2.5 mg twice a day for atrial fibrillation.  She has continues O2 at 3 L/continue via [place].  No noted SOB.  She takes Lasix 20 mg daily for chronic diastolic heart failure.   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 No pertinent surgical history.  Allergies  Allergen Reactions   Amoxicillin Nausea Only and Other (See Comments)    Listed on MAR   Doxycycline Nausea Only and Other (See Comments)    Listed on MAR   Levofloxacin Nausea Only and Other (See Comments)    Not documented on the [facility]   Morphine And Related Nausea Only and Other (See Comments)    Listed on [facility]    Outpatient Encounter Medications as of 09/05/2021  Medication Sig   albuterol (VENTOLIN HFA) 108 (90 Base) MCG/ACT inhaler Inhale 2 puffs into the lungs in the morning.   amiodarone (PACERONE) 100 MG tablet Take 2 tablets (200 mg total) by mouth daily.   apixaban (ELIQUIS) 2.5 MG TABS tablet Take 2.5 mg by mouth 2 (two) times daily.   digoxin (LANOXIN) 0.125 MG tablet Take 0.125 mg by mouth daily. Hold and notify MD if HR less than 60   furosemide (LASIX) 20 MG tablet Take 20 mg by mouth every morning.   lactose free nutrition (BOOST) LIQD Take 237 mLs by mouth  in the morning and at bedtime.   midodrine (PROAMATINE) 5 MG tablet Take 5 mg by mouth 2 (two) times daily as needed.   midodrine (PROAMATINE) 5 MG tablet Take 5 mg by mouth 2 (two) times daily as needed. give if SBP< 100   omeprazole (PRILOSEC) 20 MG capsule Take 20 mg by mouth daily.   OXYGEN Inhale 3.5 L into the lungs continuous.   QUEtiapine (SEROQUEL) 25 MG tablet Take 25 mg by mouth every 8 (eight) hours as needed. Behaviors/agitation   sertraline (ZOLOFT) 25 MG tablet Take 25 mg by mouth daily.   TRELEGY ELLIPTA 100-62.5-25 MCG/INH AEPB Take 1 puff by mouth every morning.   zinc oxide 20 % ointment Apply 1 application. topically as needed for irritation (To buttocks  after every incontinent episode).   No facility-administered encounter medications on file as of 09/05/2021.    Review of Systems  Constitutional:  Negative for appetite change, chills, fatigue and fever.  HENT:  Negative for congestion, hearing loss, rhinorrhea and sore throat.   Eyes: Negative.   Respiratory:  Negative for cough, shortness of breath and wheezing.   Cardiovascular:  Negative for chest pain, palpitations and leg swelling.  Gastrointestinal:  Negative for abdominal pain, constipation, diarrhea, nausea and vomiting.  Genitourinary:  Negative for dysuria.  Musculoskeletal:  Negative for arthralgias, back pain and myalgias.  Skin:  Negative for color change, rash and wound.  Neurological:  Negative for dizziness, weakness and headaches.  Psychiatric/Behavioral:  Negative for behavioral problems. The patient is not nervous/anxious.        Immunization History  Administered Date(s) Administered   Influenza-Unspecified 11/29/2020   Janssen (J&J) SARS-COV-2 Vaccination 06/16/2019   Pneumococcal Conjugate-13 01/16/2015   Pertinent  Health Maintenance Due  Topic Date Due   DEXA SCAN  Never done   INFLUENZA VACCINE  10/15/2021      06/09/2021   12:00 PM 06/10/2021    2:00 AM 06/10/2021   11:00 AM 06/10/2021    9:02 PM 06/11/2021    8:05 AM  Fall Risk  Patient Fall Risk Level High fall risk High fall risk High fall risk High fall risk High fall risk     Vitals:   09/05/21 1008  BP: 107/83  Pulse: 71  Resp: 18  SpO2: 94%  Weight: 89 lb (40.4 kg)  Height: 5\' 5"  (1.651 m)   Body mass index is 14.81 kg/m.  Physical Exam Constitutional:      Comments: Cachectic.  HENT:     Head: Normocephalic and atraumatic.     Nose: Nose normal.     Mouth/Throat:     Mouth: Mucous membranes are moist.  Eyes:     Conjunctiva/sclera: Conjunctivae normal.  Cardiovascular:     Rate and Rhythm: Normal rate and regular rhythm.  Pulmonary:     Effort: Pulmonary effort is  normal.     Breath sounds: Normal breath sounds.  Abdominal:     General: Bowel sounds are normal.     Palpations: Abdomen is soft.  Musculoskeletal:        General: Normal range of motion.     Cervical back: Normal range of motion.  Skin:    General: Skin is warm and dry.  Neurological:     Mental Status: She is alert. Mental status is at baseline. She is disoriented.     Comments: Alert to self.  Psychiatric:        Mood and Affect: Mood normal.  Behavior: Behavior normal.       Labs reviewed:   Recent Labs    06/07/21 0250 06/08/21 0305 06/09/21 0253 06/10/21 0259 06/11/21 0422 07/08/21 0000 07/25/21 0000 08/16/21 0000  NA 134*  --  134* 136  --  137 138 138  K 3.8  --  3.1* 4.4  --  4.4 4.6 4.6  CL 92*  --  91* 97*  --  93* 94* 95*  CO2 37*  --  36* 34*  --  39* 35* 41*  GLUCOSE 85  --  74 76  --   --   --   --   BUN 20  --  16 15  --  16 16 18   CREATININE 0.56  --  0.59 0.64  --  0.6 0.6 0.7  CALCIUM 8.3*  --  8.3* 8.4*  --  8.8 8.4* 8.6*  MG 2.3   < > 1.9 2.1 1.9  --   --   --   PHOS 3.6   < > 3.1 3.3 2.8  --   --   --    < > = values in this interval not displayed.   Recent Labs    06/05/21 0252 06/06/21 0333 06/07/21 0250 07/08/21 0000  AST 11* 14* 14* 12*  ALT 9 10 10 9   ALKPHOS 66 70 63 57  BILITOT 0.1* 0.4 0.1*  --   PROT 5.6* 5.8* 5.6*  --   ALBUMIN 3.0* 2.9* 2.8* 3.1*   Recent Labs    06/06/21 0333 06/07/21 0250 06/09/21 0253 07/08/21 0000 07/25/21 0000 08/16/21 0000  WBC 6.2 5.1 5.1 5.3 5.5 9.1  NEUTROABS 3.9 2.8  --   --  3,784.00 7,043.00  HGB 9.4* 9.3* 8.7* 10.3* 9.1* 11.0*  HCT 30.5* 29.7* 27.4* 32* 28* 33*  MCV 93.8 93.1 92.6  --   --   --   PLT 406* 397 347 223 379 343   Lab Results  Component Value Date   TSH 3.692 06/02/2021   No results found for: "HGBA1C" No results found for: "CHOL", "HDL", "LDLCALC", "LDLDIRECT", "TRIG", "CHOLHDL"  Significant Diagnostic Results in last 30 days:  No results  found.  Assessment/Plan  1. Depression, major, recurrent, severe with psychosis ([place]) -   started on Seroquel 25 mg every 12 hours and Seroquel 25 mg BID PRN -  monitor behavior for agitation  2. Chronic respiratory failure with hypoxia (HCC) -  continue O2 @ 3L/min via [place] continuously  3. Chronic diastolic congestive heart failure (HCC) -  stable, continue Lasix  20 mg daily   4. Paroxysmal SVT (supraventricular tachycardia) (HCC) -   Rate controlled, continue amiodarone and digoxin and Eliquis -  continue hospice care   Family/ staff Communication: Discussed plan of care with resident and charge nurse.  Labs/tests ordered:  None    Durenda Age, DNP, MSN, FNP-BC [facility] [phone] (Monday-Friday 8:00 a.m. - 5:00 p.m.) [phone] (after hours)

## 2021-09-23 ENCOUNTER — Non-Acute Institutional Stay (SKILLED_NURSING_FACILITY): Payer: Medicare Other | Admitting: Orthopedic Surgery

## 2021-09-23 ENCOUNTER — Encounter: Payer: Self-pay | Admitting: Orthopedic Surgery

## 2021-09-23 DIAGNOSIS — F333 Major depressive disorder, recurrent, severe with psychotic symptoms: Secondary | ICD-10-CM

## 2021-09-23 DIAGNOSIS — I471 Supraventricular tachycardia: Secondary | ICD-10-CM

## 2021-09-23 DIAGNOSIS — L89152 Pressure ulcer of sacral region, stage 2: Secondary | ICD-10-CM

## 2021-09-23 DIAGNOSIS — S82102S Unspecified fracture of upper end of left tibia, sequela: Secondary | ICD-10-CM | POA: Diagnosis not present

## 2021-09-23 DIAGNOSIS — R4189 Other symptoms and signs involving cognitive functions and awareness: Secondary | ICD-10-CM | POA: Diagnosis not present

## 2021-09-23 DIAGNOSIS — Z86718 Personal history of other venous thrombosis and embolism: Secondary | ICD-10-CM

## 2021-09-23 DIAGNOSIS — K219 Gastro-esophageal reflux disease without esophagitis: Secondary | ICD-10-CM

## 2021-09-23 DIAGNOSIS — J90 Pleural effusion, not elsewhere classified: Secondary | ICD-10-CM

## 2021-09-23 DIAGNOSIS — I5032 Chronic diastolic (congestive) heart failure: Secondary | ICD-10-CM

## 2021-09-23 DIAGNOSIS — E43 Unspecified severe protein-calorie malnutrition: Secondary | ICD-10-CM

## 2021-09-23 DIAGNOSIS — J9611 Chronic respiratory failure with hypoxia: Secondary | ICD-10-CM

## 2021-09-23 MED ORDER — QUETIAPINE FUMARATE 25 MG PO TABS: 25.0000 mg | ORAL_TABLET | Freq: Two times a day (BID) | ORAL | 0 refills | Status: AC

## 2021-09-23 NOTE — Progress Notes (Signed)
Location:  [facility] Room Number: N28/A Place of Service:  SNF [phone]) Provider: Yvonna Alanis, NP  Patient Care Team: Virgie Dad, MD as PCP - General (Internal Medicine) Darcus Austin  Extended Emergency Contact Information Primary Emergency Contact: [place] Mobile Phone: [phone] Relation: Daughter  Code Status:  DNR Goals of care: Advanced Directive information    09/23/2021   10:44 AM  Advanced Directives  Does Patient Have a Medical Advance Directive? Yes  Type of Paramedic of [employer];Living will;Out of facility DNR (pink MOST or yellow form)  Does patient want to make changes to medical advance directive? No - Patient declined  Copy of [place] in Chart? Yes - validated most recent copy scanned in chart (See row information)     Chief Complaint  Patient presents with   Medical Management of Chronic Issues    Routine visit.    Quality Metric Gaps    Discuss the need for TDAP, Shingrix, Pneumonia vaccine, Dexa scan, and additional Covid booster, or post pone if patient refuses.     HPI:  Pt is a 88 y.o. female seen today for medical management of chronic diseases.    Cognitive impairment- followed by hospice, 11/2020 MRI brain with moderate diffuse atrophy and chronic small vessel disease, increased agitation with yelling within past month, Seroquel recently increased Recurrent pleural effusion- 06/03 CXR revealed small left pleural effusion, BNP 90 08/16/2021, h/o pleural effusion in past with thoracentesis, remains on furosemide  Closed left proximal tibia fracture- 04/30 mechanical fall, xray revealed acute fracture to proximal tibia, HPOA did not want ED or ortho evaluation, unsuccessful trial of norco- confusion, left knee remains swollen with slight bruising, mild pain intermittent, remains bed bound SVT/PAF- HR controlled with  amiodarone and Digoxin, Digoxin level 1.7 06/02/2021, TSH  3.692 06/02/2021, remains on Eliquis for clot prevention Chronic bronchitis/COPD- 3-4LNC, h/o smoking, remains on Trelegy CHF- LVEF 60-65% 06/03/2021, remains on furosemide, BUN/creat 18/0.67 08/16/2021 H/o DVT- remains on low dose Eliquis Depression- no mood changes, remains on Zoloft GERD- hgb 11.0 08/16/2021 > 10.3 07/08/2021, remains on omeprazole Protein-calorie malnutrition- see weights below, remains on Boost  No recent falls or injuries. Bed bound.   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 No pertinent surgical history.  Allergies  Allergen Reactions   Amoxicillin Nausea Only and Other (See Comments)    Listed on MAR   Doxycycline Nausea Only and Other (See Comments)    Listed on MAR   Levofloxacin Nausea Only and Other (See Comments)    Not documented on the [facility]  Morphine And Related Nausea Only and Other (See Comments)    Listed on [facility]    Outpatient Encounter Medications as of 09/23/2021  Medication Sig   albuterol (VENTOLIN HFA) 108 (90 Base) MCG/ACT inhaler Inhale 2 puffs into the lungs in the morning.   amiodarone (PACERONE) 100 MG tablet Take 2 tablets (200 mg total) by mouth daily.   apixaban (ELIQUIS) 2.5 MG TABS tablet Take 2.5 mg by  mouth 2 (two) times daily.   digoxin (LANOXIN) 0.125 MG tablet Take 0.125 mg by mouth daily. Hold and notify MD if HR less than 60   furosemide (LASIX) 20 MG tablet Take 20 mg by mouth every morning.   lactose free nutrition (BOOST) LIQD Take 237 mLs by mouth in the morning and at bedtime.   midodrine (PROAMATINE) 5 MG tablet Take 5 mg by mouth 2 (two) times daily as needed. give if SBP< 100   omeprazole (PRILOSEC) 20 MG capsule Take 20 mg by mouth daily.   OXYGEN Inhale 3.5 L into the lungs continuous.   QUEtiapine (SEROQUEL) 25 MG tablet Take 25 mg by mouth every 12 (twelve) hours as needed (Restlessness/hallucinations).   sertraline (ZOLOFT) 25 MG tablet Take 25 mg by mouth daily.   TRELEGY ELLIPTA 100-62.5-25 MCG/INH AEPB Take 1 puff by mouth every morning.   zinc oxide 20 % ointment Apply 1 application. topically as needed for irritation (To buttocks after every incontinent episode).   [DISCONTINUED] midodrine (PROAMATINE) 5 MG tablet Take 5 mg by mouth 2 (two) times daily as needed.   No facility-administered encounter medications on file as of 09/23/2021.    Review of Systems  Unable to perform ROS: Dementia    Immunization History  Administered Date(s) Administered   Influenza-Unspecified 11/29/2020   Janssen (J&J) SARS-COV-2 Vaccination 06/16/2019   Pneumococcal Conjugate-13 01/16/2015   Pertinent  Health Maintenance Due  Topic Date Due   DEXA SCAN  Never done   INFLUENZA VACCINE  10/15/2021      06/09/2021   12:00 PM 06/10/2021    2:00 AM 06/10/2021   11:00 AM 06/10/2021    9:02 PM 06/11/2021    8:05 AM  Fall Risk  Patient Fall Risk Level High fall risk High fall risk High fall risk High fall risk High fall risk   Functional Status Survey:    Vitals:   09/23/21 1039  BP: 113/74  Pulse: 71  Resp: 20  Temp: (!) 96.9 F (36.1 C)  SpO2: 99%  Weight: 92 lb (41.7 kg)  Height: 5\' 5"  (1.651 m)   Body mass index is 15.31 kg/m. Physical Exam Vitals reviewed.   Constitutional:      General: She is not in acute distress. HENT:     Head: Normocephalic.     Right Ear: There is no impacted cerumen.     Left Ear: There is no impacted cerumen.     Nose: Nose normal.     Mouth/Throat:     Mouth: Mucous membranes are moist.  Eyes:     General:        Right eye: No discharge.        Left eye: No discharge.  Cardiovascular:     Rate and Rhythm: Normal rate and regular rhythm.     Pulses: Normal pulses.     Heart sounds: Normal heart sounds.  Pulmonary:     Effort: Pulmonary effort is normal. No respiratory distress.     Breath sounds: Examination of the left-middle field reveals decreased  breath sounds. Decreased breath sounds present. No wheezing or rales.  Abdominal:     General: Bowel sounds are normal. There is no distension.     Palpations: Abdomen is soft.     Tenderness: There is no abdominal tenderness.  Musculoskeletal:     Cervical back: Neck supple.     Right lower leg: No edema.     Left lower leg: No edema.  Skin:    General: Skin is warm and dry.     Capillary Refill: Capillary refill takes less than 2 seconds.     Comments: Dime sized skin tear to right upper chest, CDI, granulation tissue present, stage II skin tear to sacrum- < 1 cm, CDI, surrounding tissue blanchable  Neurological:     General: No focal deficit present.     Mental Status: She is alert. Mental status is at baseline.     Motor: Weakness present.     Gait: Gait abnormal.     Comments: Bed bound  Psychiatric:        Cognition and Memory: Cognition is impaired. Memory is impaired.     Comments: Forgetful, follows commands, alert to self and familiar face     Labs reviewed: Recent Labs    06/07/21 0250 06/08/21 0305 06/09/21 0253 06/10/21 0259 06/11/21 0422 07/08/21 0000 07/25/21 0000 08/16/21 0000  NA 134*  --  134* 136  --  137 138 138  K 3.8  --  3.1* 4.4  --  4.4 4.6 4.6  CL 92*  --  91* 97*  --  93* 94* 95*  CO2 37*  --  36* 34*  --  39*  35* 41*  GLUCOSE 85  --  74 76  --   --   --   --   BUN 20  --  16 15  --  16 16 18   CREATININE 0.56  --  0.59 0.64  --  0.6 0.6 0.7  CALCIUM 8.3*  --  8.3* 8.4*  --  8.8 8.4* 8.6*  MG 2.3   < > 1.9 2.1 1.9  --   --   --   PHOS 3.6   < > 3.1 3.3 2.8  --   --   --    < > = values in this interval not displayed.   Recent Labs    06/05/21 0252 06/06/21 0333 06/07/21 0250 07/08/21 0000  AST 11* 14* 14* 12*  ALT 9 10 10 9   ALKPHOS 66 70 63 57  BILITOT 0.1* 0.4 0.1*  --   PROT 5.6* 5.8* 5.6*  --   ALBUMIN 3.0* 2.9* 2.8* 3.1*   Recent Labs    06/06/21 0333 06/07/21 0250 06/09/21 0253 07/08/21 0000 07/25/21 0000 08/16/21 0000  WBC 6.2 5.1 5.1 5.3 5.5 9.1  NEUTROABS 3.9 2.8  --   --  3,784.00 7,043.00  HGB 9.4* 9.3* 8.7* 10.3* 9.1* 11.0*  HCT 30.5* 29.7* 27.4* 32* 28* 33*  MCV 93.8 93.1 92.6  --   --   --   PLT 406* 397 347 223 379 343   Lab Results  Component Value Date   TSH 3.692 06/02/2021   No results found for: "HGBA1C" No results found for: "CHOL", "HDL", "LDLCALC", "LDLDIRECT", "TRIG", "CHOLHDL"  Significant Diagnostic Results in last 30 days:  No results found.  Assessment/Plan 1. Pressure injury of sacral region, stage 2 (HCC) - < 1 cm, CDI, surrounding skin blanchable - cont daily dressing changes - cont frequent position  changes and barrier cream applications - cont Boost  2. Cognitive impairment - increased agitation and yelling x 1 month - cont Seroquel  3. Recurrent pleural effusion -  06/03 CXR indicated small left pleural effusion - cont furosemide - cont oxygen  4. Closed fracture of proximal end of left tibia, unspecified fracture morphology, sequela -  04/30 mechanical fall - HPOA refused ED or ortho visit - now in hospice, do not see need for f/u xray - unsuccessful trial of norco d/t confusion - cont tylenol  5. Paroxysmal SVT (supraventricular tachycardia) (HCC) - HR controlled with digoxin and amiodarone - cont Eliquis  6.  Chronic respiratory failure with hypoxia (HCC) - H/o smoking - cont oxygen - cont Trelegy and albuterol  7. Chronic diastolic congestive heart failure (HCC) - compensated - cont furosemide  8. History of DVT (deep vein thrombosis) - cont Eliquis  9. Depression, major, recurrent, severe with psychosis ([place]) - no mood changes - cont Zoloft  10. Gastroesophageal reflux disease without esophagitis - hgb stable - cont omeprazole  11. Protein-calorie malnutrition, severe ([place]) - BMI 15.31 - cont Boost - cont monthly weights    Family/ staff Communication: plan discussed with patient and nurse  Labs/tests ordered:  none

## 2021-10-17 ENCOUNTER — Non-Acute Institutional Stay (SKILLED_NURSING_FACILITY): Payer: Medicare Other | Admitting: Internal Medicine

## 2021-10-17 DIAGNOSIS — F333 Major depressive disorder, recurrent, severe with psychotic symptoms: Secondary | ICD-10-CM

## 2021-10-17 DIAGNOSIS — I471 Supraventricular tachycardia: Secondary | ICD-10-CM | POA: Diagnosis not present

## 2021-10-17 DIAGNOSIS — S82102S Unspecified fracture of upper end of left tibia, sequela: Secondary | ICD-10-CM

## 2021-10-17 DIAGNOSIS — K219 Gastro-esophageal reflux disease without esophagitis: Secondary | ICD-10-CM

## 2021-10-17 DIAGNOSIS — I5032 Chronic diastolic (congestive) heart failure: Secondary | ICD-10-CM

## 2021-10-17 DIAGNOSIS — R4189 Other symptoms and signs involving cognitive functions and awareness: Secondary | ICD-10-CM

## 2021-10-17 DIAGNOSIS — J90 Pleural effusion, not elsewhere classified: Secondary | ICD-10-CM

## 2021-10-17 DIAGNOSIS — Z86718 Personal history of other venous thrombosis and embolism: Secondary | ICD-10-CM

## 2021-10-17 DIAGNOSIS — J9611 Chronic respiratory failure with hypoxia: Secondary | ICD-10-CM

## 2021-10-20 ENCOUNTER — Encounter: Payer: Self-pay | Admitting: Internal Medicine

## 2021-10-20 NOTE — Progress Notes (Signed)
Location:  [place] of Service:  SNF (31)  Provider: Virgie Dad   Code Status: DNR [place] Goals of Care:     09/23/2021   10:44 AM  Advanced Directives  Does Patient Have a Medical Advance Directive? Yes  Type of Paramedic of [place];Living will;Out of facility DNR (pink MOST or yellow form)  Does patient want to make changes to medical advance directive? No - Patient declined  Copy of [place] in Chart? Yes - validated most recent copy scanned in chart (See row information)     Chief Complaint  Patient presents with   Medical Management of Chronic Issues    HPI: Patient is a 88 y.o. female seen today for medical management of chronic diseases.     Patient has a history of COPD on chronic oxygen History of malignant neoplasm of bronchus and lung s/p Lobectomy,  history of chronic diastolic CHF, hypertension, PSVT and history of DVT H/o Left Femur Fracture in 06/22 Cognitive impermanent  Sustained Fracture of Proximal End of Left Tibia after a fall in 04/30  Family did nto want her to go to for Ortho or ED and elected to make her Hospice Since then patient has been NWB Has stabilized  No Pain No new Nursing issues. 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  Allergies  Allergen Reactions   Amoxicillin Nausea Only and Other (See Comments)    Listed on MAR   Doxycycline Nausea Only and Other (See Comments)    Listed on MAR   Levofloxacin Nausea Only and Other (See Comments)    Not documented on the [facility]   Morphine And Related Nausea Only and Other (See Comments)    Listed on [facility]    Outpatient Encounter Medications as of 10/17/2021  Medication Sig   albuterol (VENTOLIN HFA) 108 (90 Base) MCG/ACT inhaler Inhale 2 puffs into the lungs in the morning.   amiodarone (PACERONE) 100 MG tablet Take 2 tablets (200 mg total) by mouth daily.   apixaban (ELIQUIS) 2.5 MG TABS tablet Take 2.5 mg by mouth 2 (two) times daily.   digoxin (LANOXIN) 0.125 MG tablet Take 0.125 mg by mouth daily. Hold and notify MD if HR less than 60   furosemide (LASIX) 20 MG tablet Take 20 mg by mouth every morning.   lactose free nutrition (BOOST) LIQD Take 237 mLs by mouth in the morning and at bedtime.   midodrine (PROAMATINE) 5 MG  tablet Take 5 mg by mouth 2 (two) times daily as needed. give if SBP< 100   omeprazole (PRILOSEC) 20 MG capsule Take 20 mg by mouth daily.   OXYGEN Inhale 3.5 L into the lungs continuous.   QUEtiapine (SEROQUEL) 25 MG tablet Take 25 mg by mouth every 12 (twelve) hours as needed (Restlessness/hallucinations).   QUEtiapine (SEROQUEL) 25 MG tablet Take 1 tablet (25 mg total) by mouth 2 (two) times daily.   sertraline (ZOLOFT) 25 MG tablet Take 25 mg by mouth daily.   TRELEGY ELLIPTA 100-62.5-25 MCG/INH AEPB Take 1 puff by mouth every morning.   zinc oxide 20 % ointment Apply 1 application. topically as needed for irritation (To buttocks after every incontinent episode).   No facility-administered encounter medications on file as of 10/17/2021.    Review of Systems:   Review of Systems  Constitutional:  Positive for activity change. Negative for appetite change.  HENT: Negative.    Respiratory:  Negative for cough and shortness of breath.   Cardiovascular:  Negative for leg swelling.  Gastrointestinal:  Negative for constipation.  Genitourinary: Negative.   Musculoskeletal:  Positive for gait problem. Negative for arthralgias and myalgias.  Skin: Negative.   Neurological:  Negative for dizziness and weakness.  Psychiatric/Behavioral:  Positive for confusion. Negative for dysphoric mood and sleep disturbance.     Health Maintenance  Topic Date Due   TETANUS/TDAP  Never done   Zoster Vaccines- Shingrix (1 of 2) Never done   DEXA SCAN  Never done   Pneumonia Vaccine 50+ Years old (2 - PPSV23 or PCV20) 03/13/2015   COVID-19 Vaccine (2 - Booster for [employer] series) 08/11/2019   INFLUENZA VACCINE  10/15/2021   HPV VACCINES  Aged Out    Physical Exam: Vitals:   10/17/21 1329  BP: (!) 128/57  Pulse: 72  Resp: 20  Temp: 98.7 F (37.1 C)  SpO2: 97%  Weight: 90 lb (40.8 kg)   Body mass index is 14.98 kg/m. Physical Exam Vitals reviewed.  Constitutional:      Appearance: Normal appearance.  HENT:     Head: Normocephalic.     Nose: Nose normal.     Mouth/Throat:     Mouth: Mucous membranes are moist.     Pharynx: Oropharynx is clear.  Eyes:     Pupils: Pupils are equal, round, and reactive to light.  Cardiovascular:     Rate and Rhythm: Normal rate and regular rhythm.     Pulses: Normal pulses.     Heart sounds: Normal heart sounds. No murmur heard. Pulmonary:     Effort: Pulmonary effort is normal.     Breath sounds: Normal breath sounds.  Abdominal:     General: Abdomen is flat. Bowel sounds are normal.     Palpations: Abdomen is soft.  Musculoskeletal:        General: No swelling.     Cervical back: Neck supple.  Skin:    General: Skin is warm.  Neurological:     General: No focal deficit present.     Mental Status: She is  alert.  Psychiatric:        Mood and Affect: Mood normal.        Thought Content: Thought content normal.     Labs reviewed: Basic Metabolic Panel: Recent Labs    12/04/20 0420 12/05/20 0335 06/02/21 0050 06/02/21 9892 06/07/21 0250 06/08/21 0305 06/09/21 0253 06/10/21 0259 06/11/21 0422 07/08/21 0000 07/25/21 0000 08/16/21 0000  NA 138   < >  --    < >  134*  --  134* 136  --  137 138 138  K 4.2   < >  --    < > 3.8  --  3.1* 4.4  --  4.4 4.6 4.6  CL 98   < >  --    < > 92*  --  91* 97*  --  93* 94* 95*  CO2 33*   < >  --    < > 37*  --  36* 34*  --  39* 35* 41*  GLUCOSE 83   < >  --    < > 85  --  74 76  --   --   --   --   BUN 18   < >  --    < > 20  --  16 15  --  16 16 18   CREATININE 0.86   < >  --    < > 0.56  --  0.59 0.64  --  0.6 0.6 0.7  CALCIUM 8.6*   < >  --    < > 8.3*  --  8.3* 8.4*  --  8.8 8.4* 8.6*  MG 1.8   < >  --    < > 2.3   < > 1.9 2.1 1.9  --   --   --   PHOS 3.8  --   --    < > 3.6   < > 3.1 3.3 2.8  --   --   --   TSH 2.915  --  3.692  --   --   --   --   --   --   --   --   --    < > = values in this interval not displayed.   Liver Function Tests: Recent Labs    06/05/21 0252 06/06/21 0333 06/07/21 0250 07/08/21 0000  AST 11* 14* 14* 12*  ALT 9 10 10 9   ALKPHOS 66 70 63 57  BILITOT 0.1* 0.4 0.1*  --   PROT 5.6* 5.8* 5.6*  --   ALBUMIN 3.0* 2.9* 2.8* 3.1*   No results for input(s): "LIPASE", "AMYLASE" in the last 8760 hours. No results for input(s): "AMMONIA" in the last 8760 hours. CBC: Recent Labs    06/06/21 0333 06/07/21 0250 06/09/21 0253 07/08/21 0000 07/25/21 0000 08/16/21 0000  WBC 6.2 5.1 5.1 5.3 5.5 9.1  NEUTROABS 3.9 2.8  --   --  3,784.00 7,043.00  HGB 9.4* 9.3* 8.7* 10.3* 9.1* 11.0*  HCT 30.5* 29.7* 27.4* 32* 28* 33*  MCV 93.8 93.1 92.6  --   --   --   PLT 406* 397 347 223 379 343   Lipid Panel: No results for input(s): "CHOL", "HDL", "LDLCALC", "TRIG", "CHOLHDL", "LDLDIRECT" in the last 8760 hours. No results  found for: "HGBA1C"  Procedures since last visit: No results found.  Assessment/Plan 1. Closed fracture of proximal end of left tibia, unspecified fracture morphology, sequela She has no pain Discontinue order of NWB D/w the staff and they can get her up possible in recliner  2. Cognitive impairment Doing well Behaviors under control with Seroquel and Zoloft  3. Recurrent pleural effusion Recent Xray in 06/23 showed mild Pleural effusion Patient not Hypoxic and I=on chronic Oxygen  4. Paroxysmal SVT (supraventricular tachycardia) (HCC) On Amiodarone and Digoxin Also on Elquis  5. Chronic respiratory failure with hypoxia (HCC) Continue Oxygen and trelegy  6. Chronic diastolic congestive heart failure (HCC) On lasix  7. History of DVT (deep vein thrombosis) On Eliquis  8. Depression, major, recurrent, severe with psychosis ([place]) Continue Zoloft  9. Gastroesophageal reflux disease without esophagitis On Prilosec    Labs/tests ordered:  * No order type specified *  Virgie Dad, MD

## 2021-12-09 ENCOUNTER — Encounter: Payer: Self-pay | Admitting: Orthopedic Surgery

## 2021-12-09 ENCOUNTER — Non-Acute Institutional Stay (SKILLED_NURSING_FACILITY): Payer: Medicare Other | Admitting: Orthopedic Surgery

## 2021-12-09 DIAGNOSIS — J9611 Chronic respiratory failure with hypoxia: Secondary | ICD-10-CM

## 2021-12-09 DIAGNOSIS — R110 Nausea: Secondary | ICD-10-CM

## 2021-12-09 DIAGNOSIS — R0602 Shortness of breath: Secondary | ICD-10-CM

## 2021-12-09 DIAGNOSIS — F333 Major depressive disorder, recurrent, severe with psychotic symptoms: Secondary | ICD-10-CM

## 2021-12-09 DIAGNOSIS — S82102S Unspecified fracture of upper end of left tibia, sequela: Secondary | ICD-10-CM

## 2021-12-09 DIAGNOSIS — Z86718 Personal history of other venous thrombosis and embolism: Secondary | ICD-10-CM

## 2021-12-09 DIAGNOSIS — K219 Gastro-esophageal reflux disease without esophagitis: Secondary | ICD-10-CM

## 2021-12-09 DIAGNOSIS — I471 Supraventricular tachycardia, unspecified: Secondary | ICD-10-CM

## 2021-12-09 DIAGNOSIS — I5032 Chronic diastolic (congestive) heart failure: Secondary | ICD-10-CM

## 2021-12-09 DIAGNOSIS — J90 Pleural effusion, not elsewhere classified: Secondary | ICD-10-CM

## 2021-12-09 DIAGNOSIS — R4189 Other symptoms and signs involving cognitive functions and awareness: Secondary | ICD-10-CM

## 2021-12-09 DIAGNOSIS — E43 Unspecified severe protein-calorie malnutrition: Secondary | ICD-10-CM

## 2021-12-09 MED ORDER — ONDANSETRON HCL 4 MG PO TABS: 4.0000 mg | ORAL_TABLET | Freq: Three times a day (TID) | ORAL | 0 refills | Status: AC | PRN

## 2021-12-09 MED ORDER — MORPHINE SULFATE (CONCENTRATE) 20 MG/ML PO SOLN: 5.0000 mg | ORAL | 0 refills | Status: DC | PRN

## 2021-12-09 NOTE — Addendum Note (Signed)
Addended byWindell Moulding E on: 12/09/2021 12:47 PM   Modules accepted: Orders

## 2021-12-09 NOTE — Progress Notes (Addendum)
Location:  [place] Room Number: 28/A Place of Service:  SNF [phone]) Provider:  Yvonna Alanis, NP   Virgie Dad, MD  Patient Care Team: Virgie Dad, MD as PCP - General (Internal Medicine) Darcus [place]  Extended Emergency Contact Information Primary Emergency Contact: [place] Mobile Phone: [phone] Relation: Daughter  Code Status:  DNR Goals of care: Advanced Directive information    09/23/2021   10:44 AM  Advanced Directives  Does Patient Have a Medical Advance Directive? Yes  Type of Paramedic of [place];Living will;Out of facility DNR (pink MOST or yellow form)  Does patient want to make changes to medical advance directive? No - Patient declined  Copy of [place] in Chart? Yes - validated most recent copy scanned in chart (See row information)     Chief Complaint  Patient presents with   Medical Management of Chronic Issues    HPI:  Pt is a 88 y.o. female seen today for acute visit due to shortness of breath.   H/o malignant neoplasm of bronchus s/p lobectomy, COPD, left pleural effusion and respiratory failure. 09/23 oxygen saturation 85%. She is on continuous oxygen, averages 3-4 liters. Nursing staff increased her oxygen to 5 liters and sats rebounded to 90%. Hospice started duonebs. She reports improved breathing today. Denies cold symptoms. Recent covid test negative. She is on albuterol and Trelegy daily.   Cognitive impairment- followed by hospice, 11/2020 MRI brain with moderate diffuse atrophy and chronic small vessel disease, remains on Seroquel  Recurrent pleural effusion- 06/03 CXR revealed small left pleural effusion, BNP 90 08/16/2021, h/o pleural effusion in past with thoracentesis, remains on furosemide  Closed left proximal tibia fracture- 04/30 mechanical fall, xray revealed acute fracture to proximal tibia, HPOA did not want ED or ortho evaluation, unsuccessful trial  of norco- confusion, denies pain today but will have intermittent pain, NWB discontinued SVT/PAF- HR controlled with  amiodarone and Digoxin, Digoxin level 1.7 06/02/2021, TSH 3.692 06/02/2021, remains on Eliquis for clot prevention Chronic bronchitis/COPD- 3-4LNC, h/o smoking, remains on Trelegy CHF- LVEF 60-65% 06/03/2021, remains on furosemide, BUN/creat 18/0.67 08/16/2021 H/o DVT- remains on low dose Eliquis Depression- no mood changes, remains on Zoloft GERD- hgb 11.0 08/16/2021 > 10.3 07/08/2021, remains on omeprazole Protein-calorie malnutrition- BMI 15.48, see weights below, remains on Boost  09/11 found on floor by staff. No apparent injury.   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  Allergies  Allergen Reactions   Amoxicillin Nausea Only and Other (See Comments)    Listed on MAR   Doxycycline Nausea  Only and Other (See Comments)    Listed on MAR   Levofloxacin Nausea Only and Other (See Comments)    Not documented on the [facility]   Morphine And Related Nausea Only and Other (See Comments)    Listed on [facility]    Outpatient Encounter Medications as of 12/09/2021  Medication Sig   albuterol (VENTOLIN HFA) 108 (90 Base) MCG/ACT  inhaler Inhale 2 puffs into the lungs in the morning.   amiodarone (PACERONE) 100 MG tablet Take 2 tablets (200 mg total) by mouth daily.   apixaban (ELIQUIS) 2.5 MG TABS tablet Take 2.5 mg by mouth 2 (two) times daily.   digoxin (LANOXIN) 0.125 MG tablet Take 0.125 mg by mouth daily. Hold and notify MD if HR less than 60   furosemide (LASIX) 20 MG tablet Take 20 mg by mouth every morning.   lactose free nutrition (BOOST) LIQD Take 237 mLs by mouth in the morning and at bedtime.   midodrine (PROAMATINE) 5 MG tablet Take 5 mg by mouth 2 (two) times daily as needed. give if SBP< 100   omeprazole (PRILOSEC) 20 MG capsule Take 20 mg by mouth daily.   OXYGEN Inhale 3.5 L into the lungs continuous.   QUEtiapine (SEROQUEL) 25 MG tablet Take 1 tablet (25 mg total) by mouth 2 (two) times daily.   sertraline (ZOLOFT) 25 MG tablet Take 25 mg by mouth daily.   TRELEGY ELLIPTA 100-62.5-25 MCG/INH AEPB Take 1 puff by mouth every morning.   zinc oxide 20 % ointment Apply 1 application. topically as needed for irritation (To buttocks after every incontinent episode).   No facility-administered encounter medications on file as of 12/09/2021.    Review of Systems  Unable to perform ROS: Dementia    Immunization History  Administered Date(s) Administered   Influenza-Unspecified 11/29/2020   Janssen (J&J) SARS-COV-2 Vaccination 06/16/2019   Pneumococcal Conjugate-13 01/16/2015   Pertinent  Health Maintenance Due  Topic Date Due   DEXA SCAN  Never done   INFLUENZA VACCINE  10/15/2021      06/09/2021   12:00 PM 06/10/2021    2:00 AM 06/10/2021   11:00 AM 06/10/2021    9:02 PM 06/11/2021    8:05 AM  Fall Risk  Patient Fall Risk Level High fall risk High fall risk High fall risk High fall risk High fall risk   Functional Status Survey:    Vitals:   12/09/21 1049  BP: 122/71  Pulse: 88  Resp: 20  Temp: (!) 97.5 F (36.4 C)  SpO2: 90%  Weight: 93 lb (42.2 kg)  Height: 5\' 5"  (1.651 m)   Body  mass index is 15.48 kg/m. Physical Exam Vitals reviewed.  Constitutional:      Appearance: She is underweight.  HENT:     Head: Normocephalic.     Right Ear: There is no impacted cerumen.     Left Ear: There is no impacted cerumen.     Nose: Nose normal.     Mouth/Throat:     Mouth: Mucous membranes are moist.  Eyes:     General:        Right eye: No discharge.        Left eye: No discharge.  Cardiovascular:     Rate and Rhythm: Normal rate. Rhythm irregular.     Pulses: Normal pulses.     Heart sounds: Normal heart sounds.  Pulmonary:     Effort: Pulmonary effort is normal.     Breath sounds: Normal breath  sounds.  Abdominal:     General: Bowel sounds are normal. There is no distension.     Palpations: Abdomen is soft.     Tenderness: There is no abdominal tenderness.  Musculoskeletal:     Cervical back: Neck supple.     Right lower leg: No edema.     Left lower leg: No edema.  Skin:    General: Skin is warm and dry.     Capillary Refill: Capillary refill takes less than 2 seconds.  Neurological:     General: No focal deficit present.     Mental Status: She is alert. Mental status is at baseline.     Motor: Weakness present.     Gait: Gait abnormal.     Comments: bedbound  Psychiatric:        Mood and Affect: Mood normal.        Behavior: Behavior normal.     Labs reviewed: Recent Labs    06/07/21 0250 06/08/21 0305 06/09/21 0253 06/10/21 0259 06/11/21 0422 07/08/21 0000 07/25/21 0000 08/16/21 0000  NA 134*  --  134* 136  --  137 138 138  K 3.8  --  3.1* 4.4  --  4.4 4.6 4.6  CL 92*  --  91* 97*  --  93* 94* 95*  CO2 37*  --  36* 34*  --  39* 35* 41*  GLUCOSE 85  --  74 76  --   --   --   --   BUN 20  --  16 15  --  16 16 18   CREATININE 0.56  --  0.59 0.64  --  0.6 0.6 0.7  CALCIUM 8.3*  --  8.3* 8.4*  --  8.8 8.4* 8.6*  MG 2.3   < > 1.9 2.1 1.9  --   --   --   PHOS 3.6   < > 3.1 3.3 2.8  --   --   --    < > = values in this interval not displayed.    Recent Labs    06/05/21 0252 06/06/21 0333 06/07/21 0250 07/08/21 0000  AST 11* 14* 14* 12*  ALT 9 10 10 9   ALKPHOS 66 70 63 57  BILITOT 0.1* 0.4 0.1*  --   PROT 5.6* 5.8* 5.6*  --   ALBUMIN 3.0* 2.9* 2.8* 3.1*   Recent Labs    06/06/21 0333 06/07/21 0250 06/09/21 0253 07/08/21 0000 07/25/21 0000 08/16/21 0000  WBC 6.2 5.1 5.1 5.3 5.5 9.1  NEUTROABS 3.9 2.8  --   --  3,784.00 7,043.00  HGB 9.4* 9.3* 8.7* 10.3* 9.1* 11.0*  HCT 30.5* 29.7* 27.4* 32* 28* 33*  MCV 93.8 93.1 92.6  --   --   --   PLT 406* 397 347 223 379 343   Lab Results  Component Value Date   TSH 3.692 06/02/2021   No results found for: "HGBA1C" No results found for: "CHOL", "HDL", "LDLCALC", "LDLDIRECT", "TRIG", "CHOLHDL"  Significant Diagnostic Results in last 30 days:  No results found.  Assessment/Plan 1. Shortness of breath - onset 09/23 - duonebs started by hospice - sats > 90% - reports improved breathing - cont oxygen - will start roxanol to help with breathing and pain  2. Cognitive impairment - no behaviors - cont Seroquel  3. Recurrent pleural effusion - noted CXR 06/23 - cont oxygen  4. Closed fracture of proximal end of left tibia, unspecified fracture morphology, sequela - resolved - will have  intermittent pain - bed bound  5. Paroxysmal SVT (supraventricular tachycardia) (HCC) - HR controlled with amiodarone and Digoxin - cont Eliquis for clot prevention  6. Chronic respiratory failure with hypoxia (HCC) - cont albuterol and Trelegy  7. Chronic diastolic congestive heart failure (HCC) - no weight fluctuations, sob, ankle edema  8. History of DVT (deep vein thrombosis) - cont Eliquis  9. Depression, major, recurrent, severe with psychosis ([place]) - cont Zoloft  10. Gastroesophageal reflux disease without esophagitis - cont Prilosec  11. Protein-calorie malnutrition, severe ([place]) - BMI 15.48 - cont Boost - cont monthly weights    Family/ staff  Communication: plan discussed with patient and nurse  Labs/tests ordered:  none

## 2021-12-10 NOTE — Addendum Note (Signed)
Addended byWindell Moulding E on: 11/25/2021 12:38 PM   Modules accepted: Orders

## 2021-12-15 DEATH — deceased

## 2023-05-14 IMAGING — DX DG CHEST 1V PORT
1 series · 1 of 1 positions shown · non-contrast
Comparison: 12/03/2020

CLINICAL DATA: Dyspnea

EXAM:
PORTABLE CHEST 1 VIEW

[chest ap]
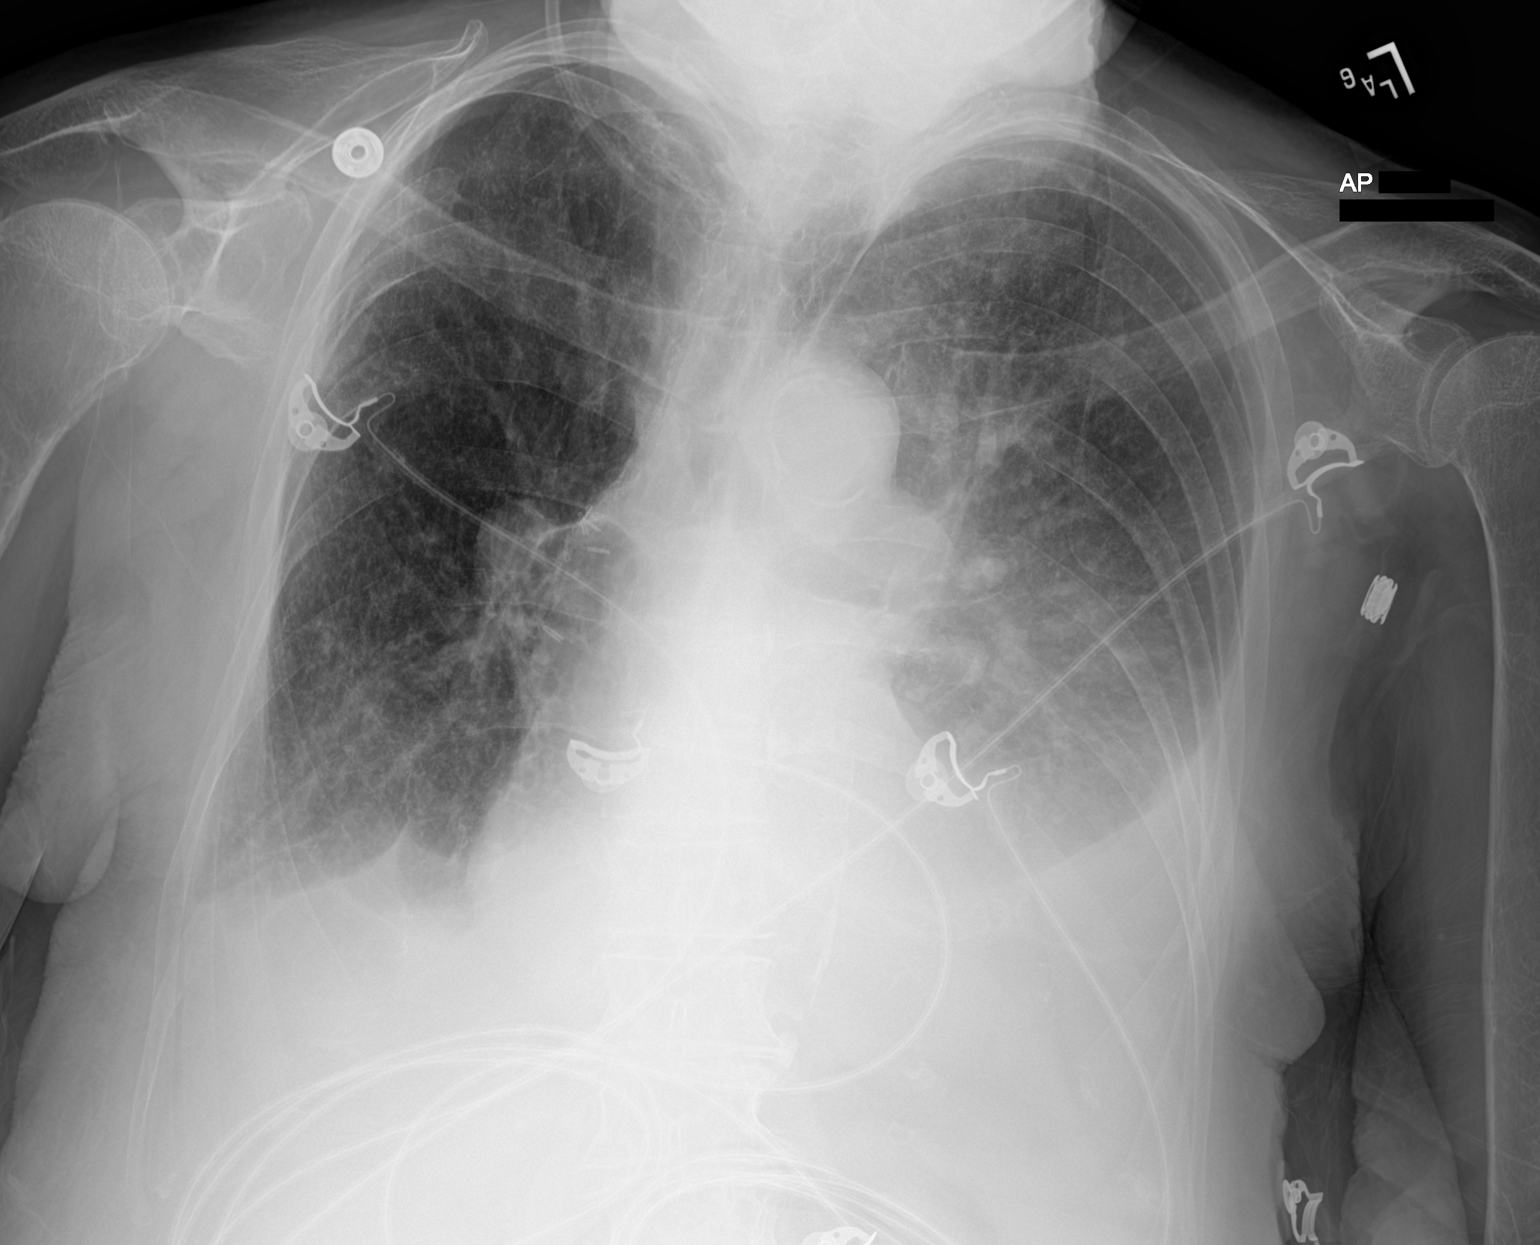

[1 of 1 positions shown; findings below may reference images not displayed]

FINDINGS: Cardiac enlargement. Bilateral pleural effusions, greater on the
left, with left greater than right perihilar infiltration. This
could represent edema or pneumonia. Surgical clips in the right
hilum with hilar prominence, possibly lymphadenopathy. Old right rib
fractures. Degenerative changes in the spine and shoulders.
Calcification of the aorta.
IMPRESSION: Bilateral pleural effusions and perihilar infiltrates, greater on
the left. Postoperative changes and nodular prominence in the left
hilum.

## 2023-05-14 IMAGING — CR DG CHEST 2V
2 series · 2 of 2 positions shown · non-contrast
Comparison: Chest radiograph 09/15/2020

CLINICAL DATA: Weakness, hypotensive, pitting in legs

EXAM:
CHEST - 2 VIEW

[w chest lat]
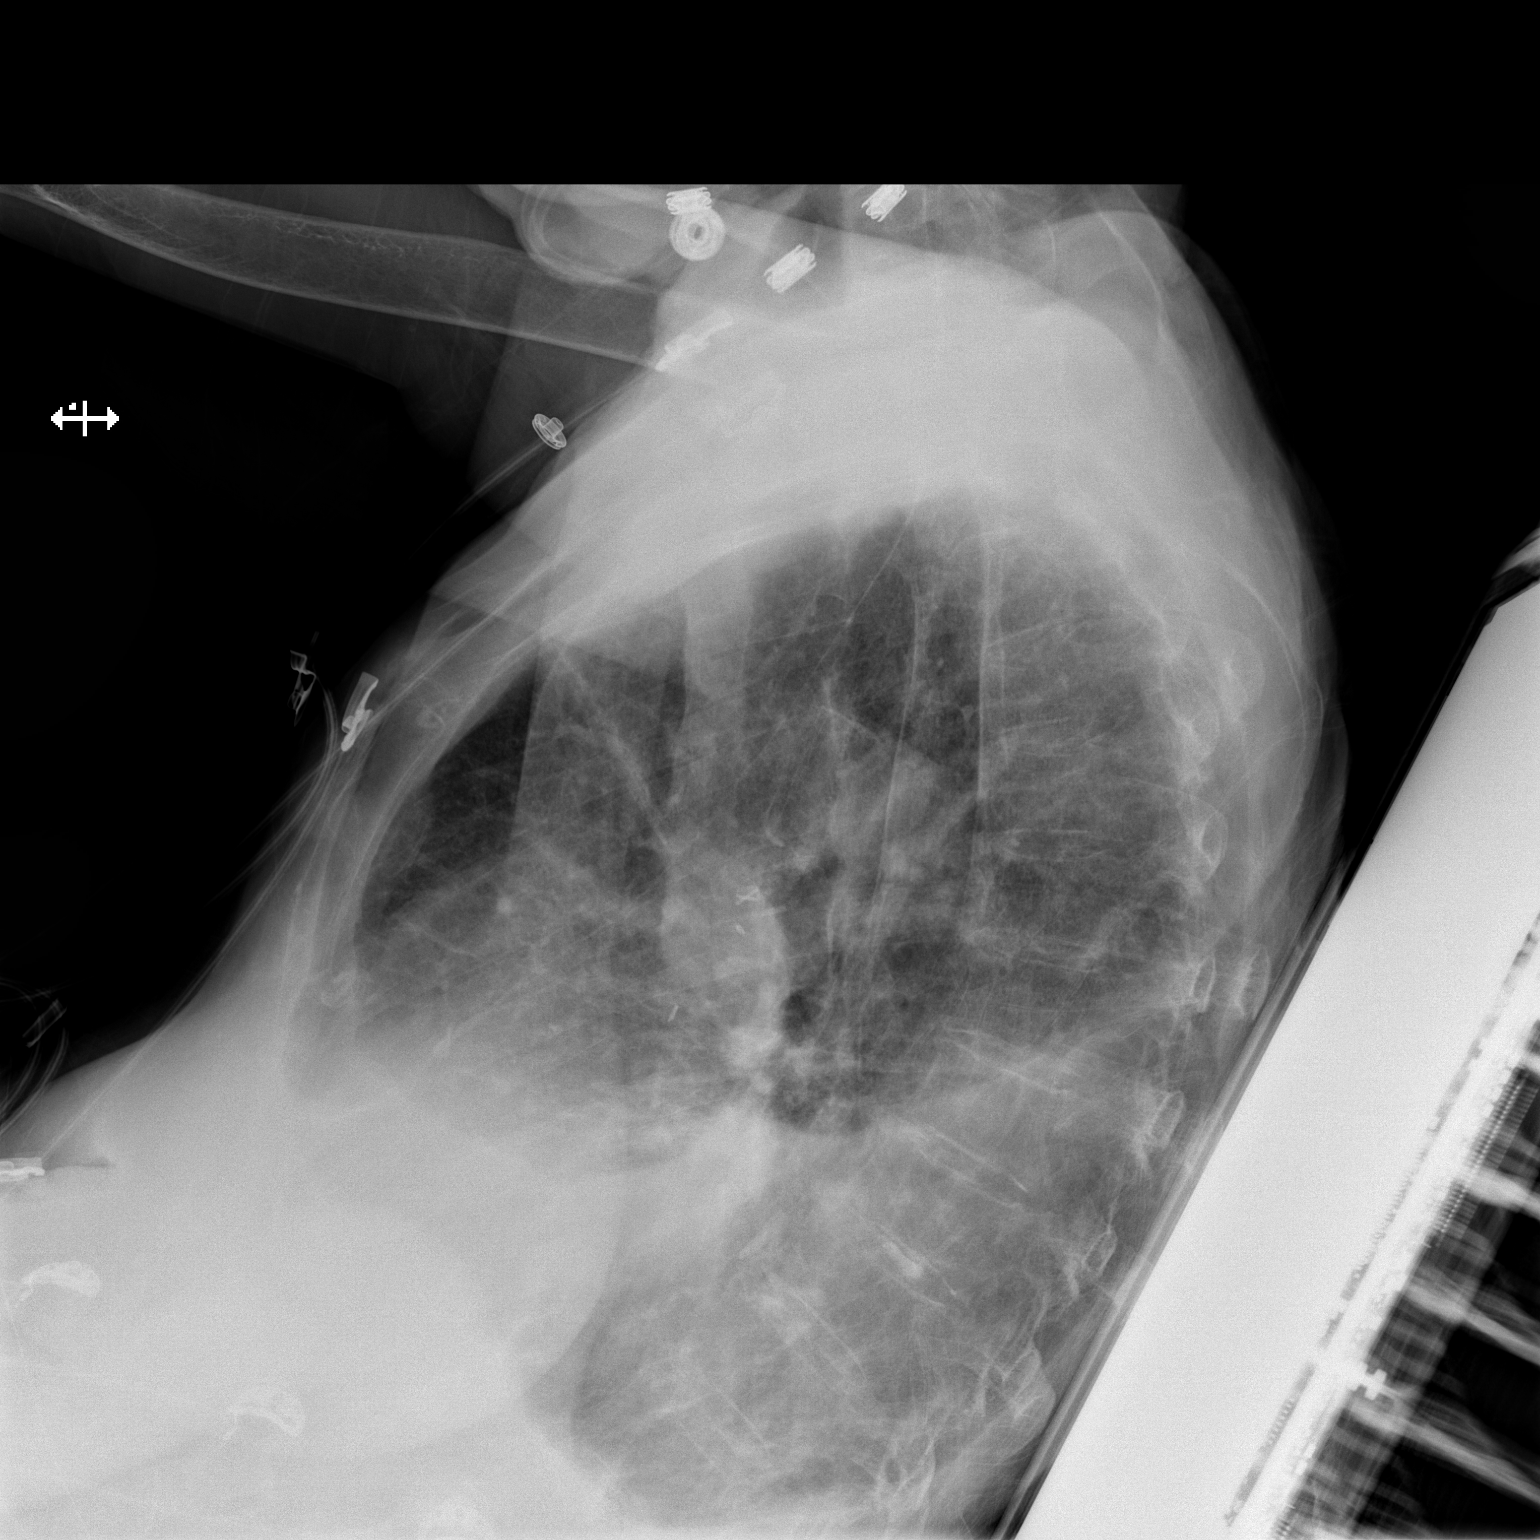

[x chest ap]
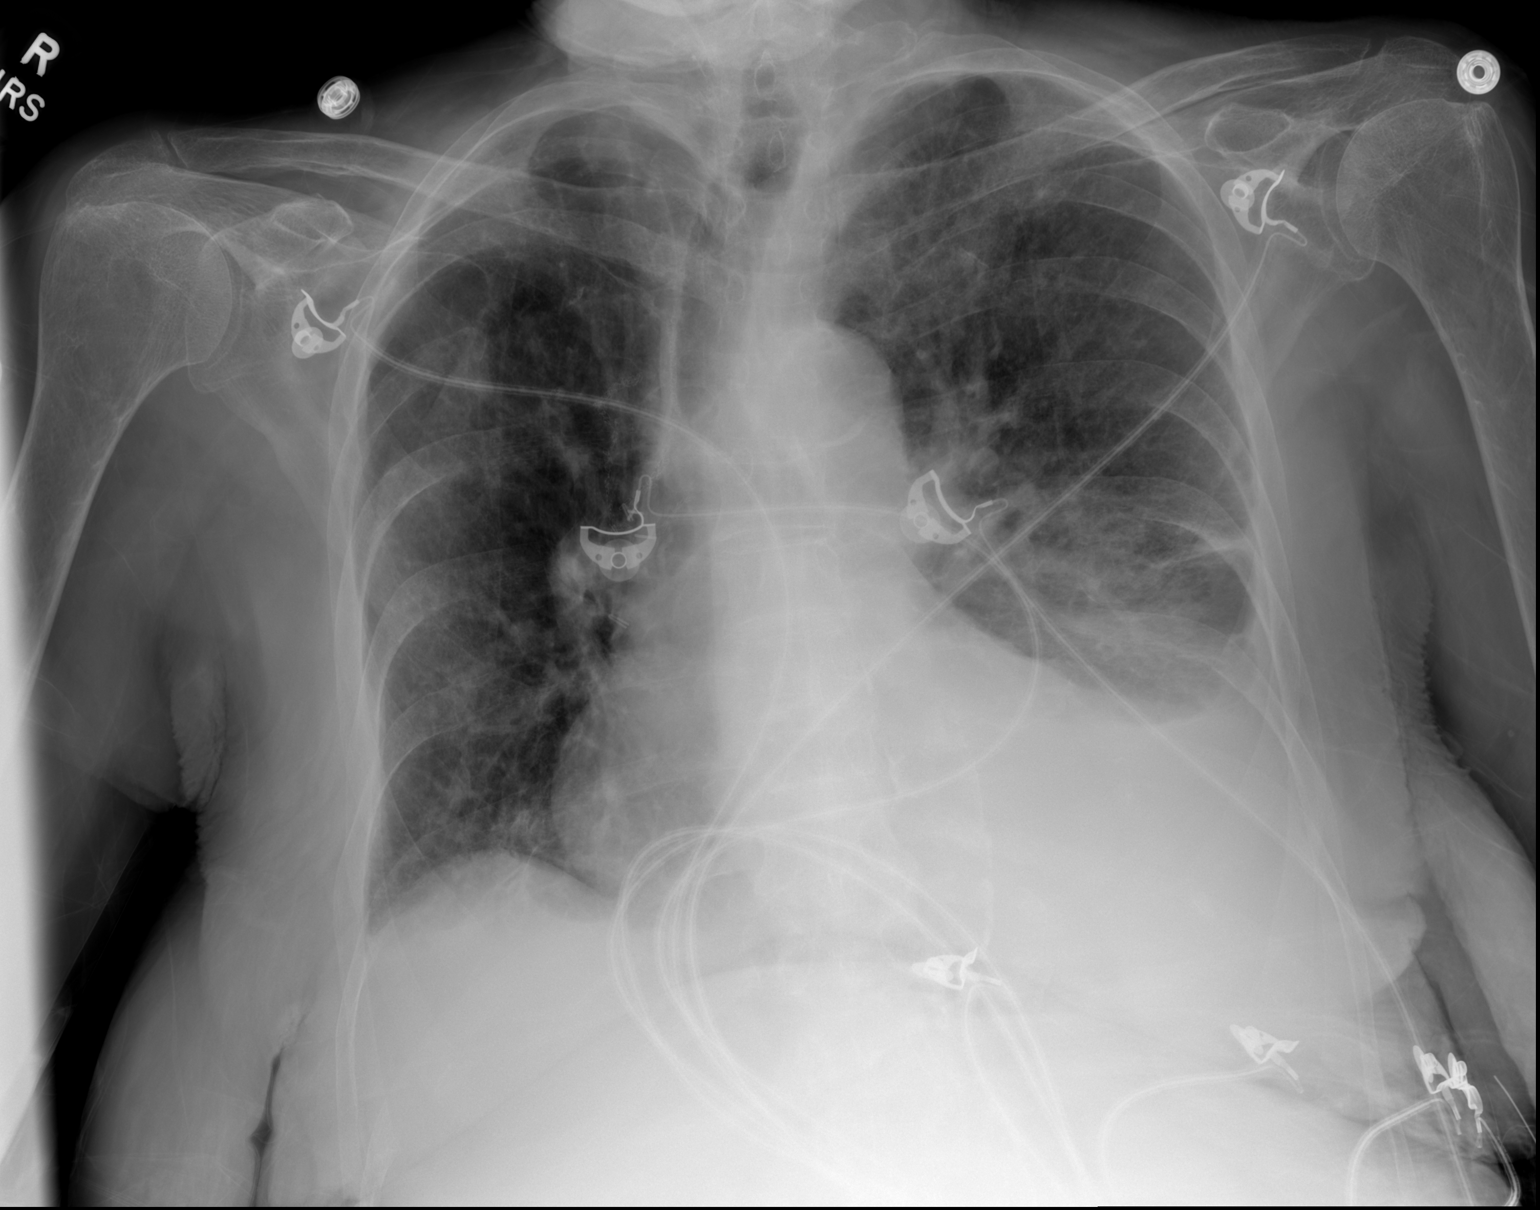

[2 of 2 positions shown; findings below may reference images not displayed]

FINDINGS: The heart is mildly enlarged, unchanged. The mediastinal contours
are stable.

There is a small to moderate size left pleural effusion, increased
in size since 09/16/2020. There is adjacent airspace disease. The
left upper lobe is well aerated.

There is no focal consolidation on the right. There is no
significant right pleural effusion. There is no pneumothorax.

There is no acute osseous abnormality.
IMPRESSION: Since 09/15/2020, there has been interval increase in size of the
small to moderate size left pleural effusion with worsening aeration
at the left base.

## 2023-05-16 IMAGING — DX DG CHEST 1V
1 series · 2 of 2 positions shown · non-contrast
Comparison: Chest x-ray 12/03/2020.

CLINICAL DATA: Post thoracentesis.

EXAM:
CHEST  1 VIEW

[Series 1: chest ap · 0.14mm/px · 2 of 2 slices shown]
[im 1/2]
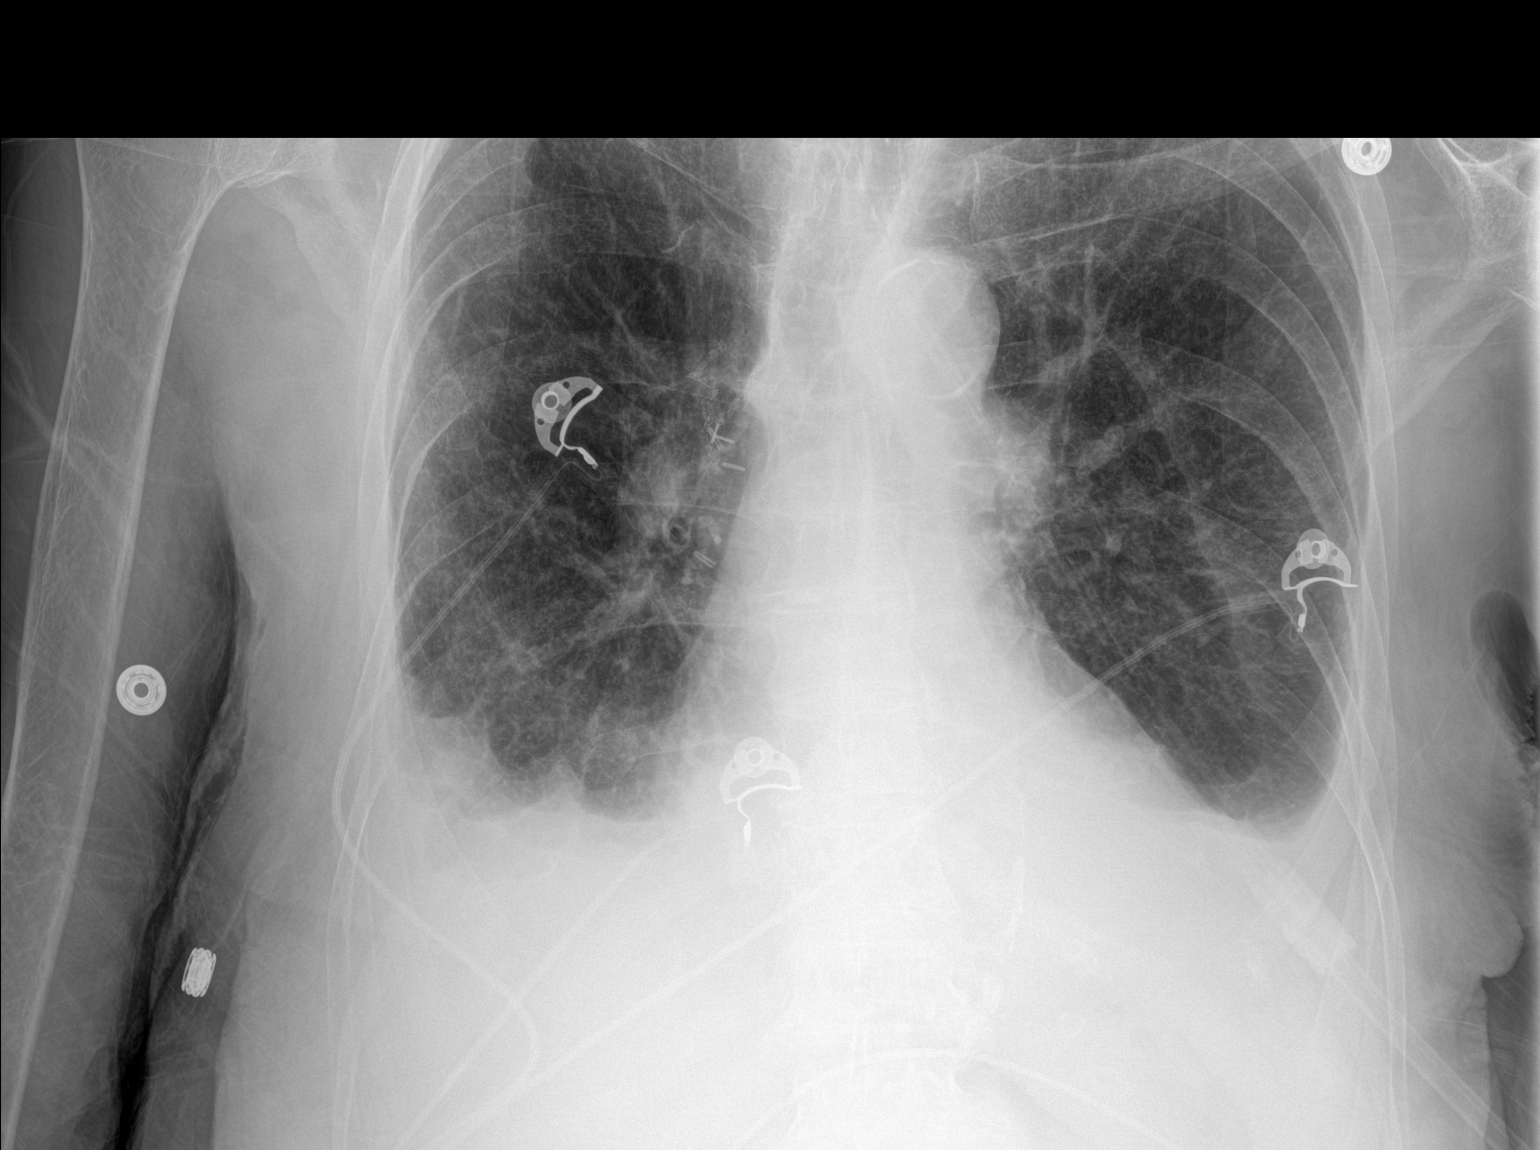
[im 2/2]
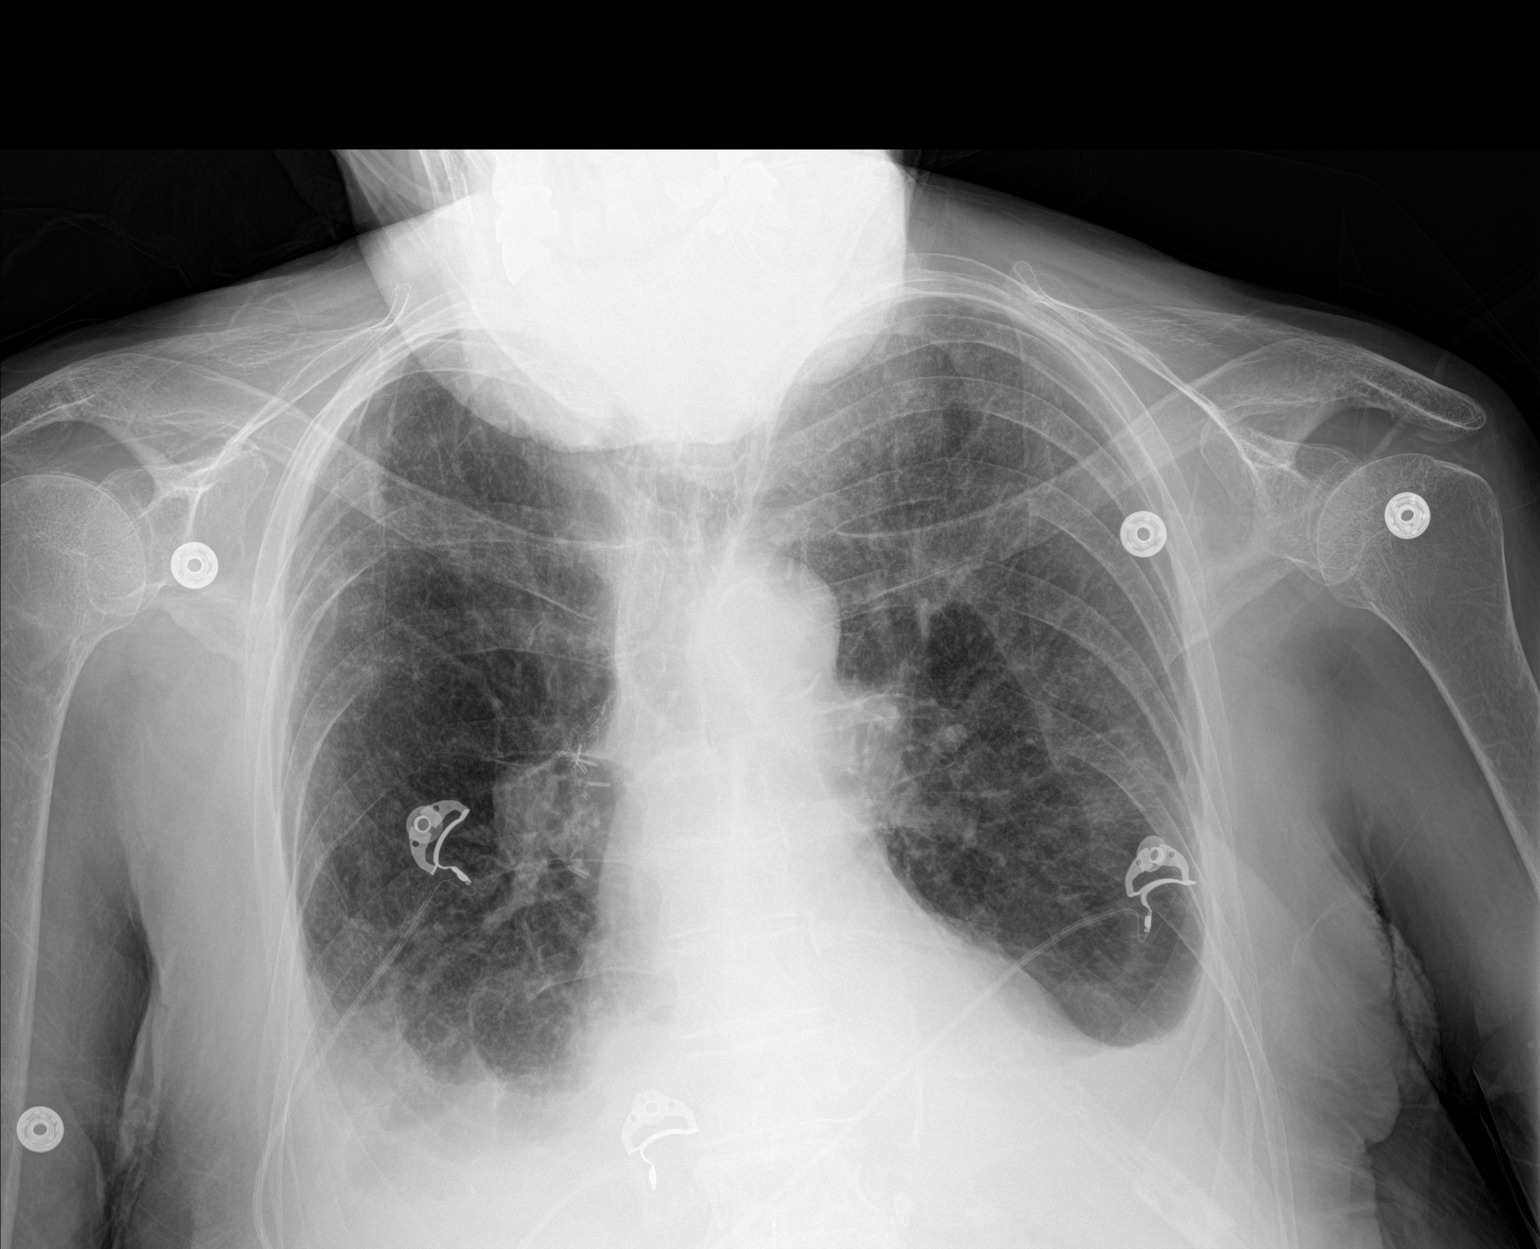

[2 of 2 positions shown; findings below may reference images not displayed]

FINDINGS: There is a small left pleural effusion which is decreased from the
prior examination. There are findings suspicious for tiny left
apical pneumothorax. There is no mediastinal shift. Small right
pleural effusion is unchanged from the prior examination. The
cardiomediastinal silhouette is stable. Surgical changes overlie the
right hilar region. There are atherosclerotic calcifications of the
aorta. Osseous structures are within normal limits.
IMPRESSION: 1. Tiny left apical pneumothorax.
2. Small left pleural effusion has decreased.
3. Small right pleural effusion is unchanged.
4.  Aortic Atherosclerosis ([id number]).

## 2023-05-16 IMAGING — US US THORACENTESIS ASP PLEURAL SPACE W/IMG GUIDE
1 series · 2 of 2 positions shown · non-contrast
Comparison: none

INDICATION: Pleural effusion.

[Series 1: us thoracentesis asp pleural space w/img guide · 2 of 2 slices shown]
[im 1/2]
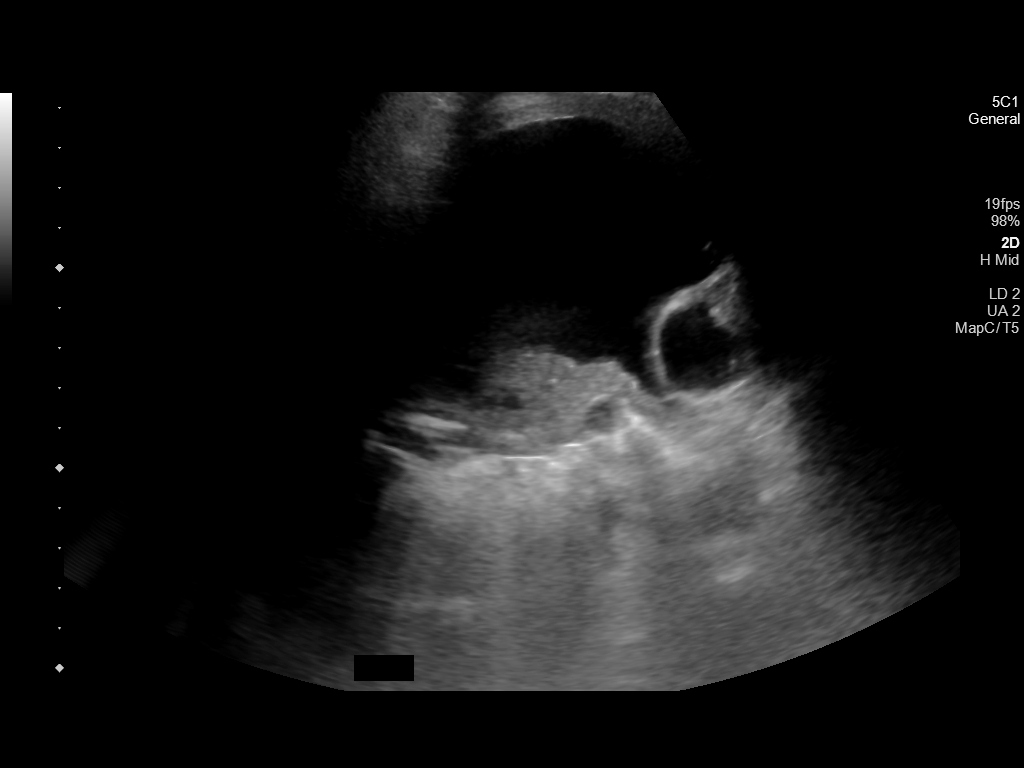
[im 2/2]
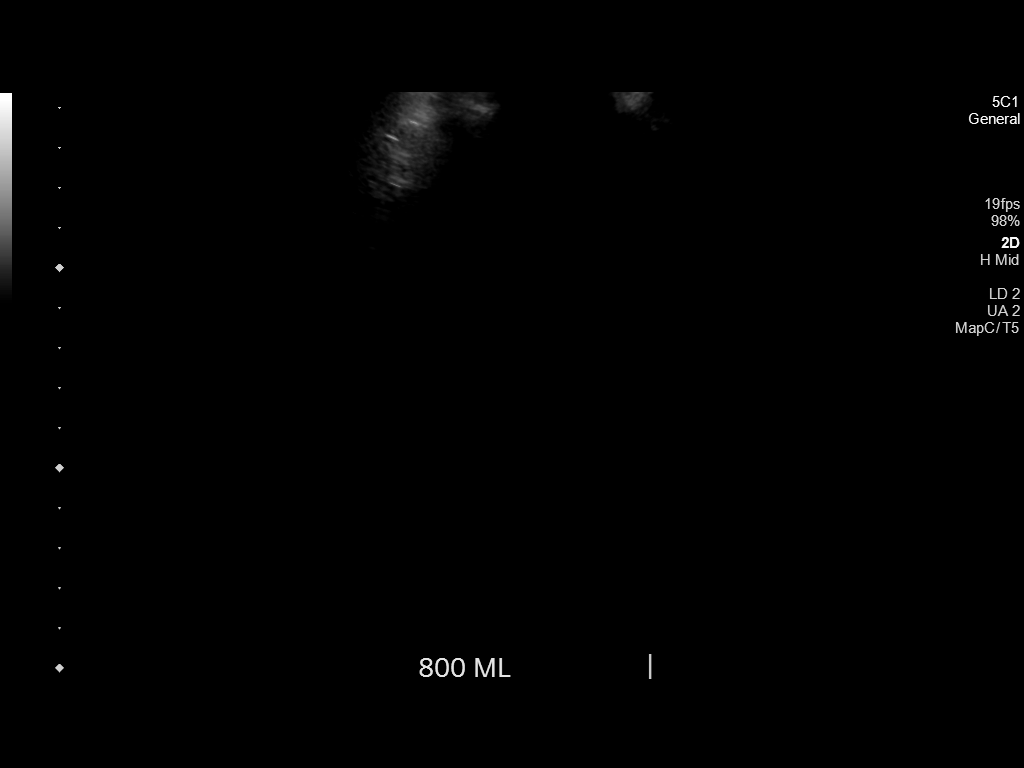

[2 of 2 positions shown; findings below may reference images not displayed]

EXAM:
ULTRASOUND GUIDED DIAGNOSITIC AND THERAPEUTIC THORACENTESIS

MEDICATIONS:
None.

COMPLICATIONS:
None immediate.

PROCEDURE:
An ultrasound guided thoracentesis was thoroughly discussed with the
patient and questions answered. The benefits, risks, alternatives
and complications were also discussed. The patient understands and
wishes to proceed with the procedure. Written consent was obtained.

Ultrasound was performed to localize and mark an adequate pocket of
fluid in the left chest. The area was then prepped and draped in the
normal sterile fashion. 1% Lidocaine was used for local anesthesia.
Under ultrasound guidance a 6 Fr Safe-T-Centesis catheter was
introduced. Thoracentesis was performed. The catheter was removed
and a dressing applied.
FINDINGS: A total of approximately 800 of red-tinged fluid was removed.
Samples were sent to the laboratory as requested by the clinical
team.
IMPRESSION: Successful ultrasound guided diagnostic and therapeutic
thoracentesis yielding 566cc of pleural fluid.
